# Patient Record
Sex: Female | Born: 1945
Health system: Southern US, Community
[De-identification: ages and names within clinical notes are randomized; demographics above are authoritative.]

## PROBLEM LIST (undated history)

## (undated) DIAGNOSIS — I10 Essential (primary) hypertension: Secondary | ICD-10-CM

## (undated) DIAGNOSIS — H409 Unspecified glaucoma: Secondary | ICD-10-CM

## (undated) DIAGNOSIS — R1310 Dysphagia, unspecified: Secondary | ICD-10-CM

## (undated) DIAGNOSIS — Z8739 Personal history of other diseases of the musculoskeletal system and connective tissue: Secondary | ICD-10-CM

## (undated) DIAGNOSIS — I341 Nonrheumatic mitral (valve) prolapse: Secondary | ICD-10-CM

## (undated) DIAGNOSIS — K219 Gastro-esophageal reflux disease without esophagitis: Secondary | ICD-10-CM

## (undated) DIAGNOSIS — J309 Allergic rhinitis, unspecified: Secondary | ICD-10-CM

## (undated) DIAGNOSIS — M1711 Unilateral primary osteoarthritis, right knee: Secondary | ICD-10-CM

## (undated) DIAGNOSIS — R609 Edema, unspecified: Secondary | ICD-10-CM

## (undated) DIAGNOSIS — K5790 Diverticulosis of intestine, part unspecified, without perforation or abscess without bleeding: Secondary | ICD-10-CM

## (undated) DIAGNOSIS — M5432 Sciatica, left side: Secondary | ICD-10-CM

## (undated) DIAGNOSIS — M1990 Unspecified osteoarthritis, unspecified site: Secondary | ICD-10-CM

## (undated) DIAGNOSIS — I951 Orthostatic hypotension: Secondary | ICD-10-CM

## (undated) DIAGNOSIS — K5730 Diverticulosis of large intestine without perforation or abscess without bleeding: Secondary | ICD-10-CM

## (undated) DIAGNOSIS — E663 Overweight: Secondary | ICD-10-CM

## (undated) DIAGNOSIS — M545 Low back pain: Secondary | ICD-10-CM

## (undated) DIAGNOSIS — M7551 Bursitis of right shoulder: Secondary | ICD-10-CM

## (undated) DIAGNOSIS — H9190 Unspecified hearing loss, unspecified ear: Secondary | ICD-10-CM

## (undated) DIAGNOSIS — M5430 Sciatica, unspecified side: Secondary | ICD-10-CM

## (undated) DIAGNOSIS — N3941 Urge incontinence: Secondary | ICD-10-CM

## (undated) DIAGNOSIS — M5412 Radiculopathy, cervical region: Secondary | ICD-10-CM

## (undated) HISTORY — DX: Low back pain: M54.5

## (undated) HISTORY — DX: Sciatica, unspecified side: M54.30

## (undated) HISTORY — DX: Overweight: E66.3

## (undated) HISTORY — DX: Gastro-esophageal reflux disease without esophagitis: K21.9

## (undated) HISTORY — DX: Unilateral primary osteoarthritis, right knee: M17.11

## (undated) HISTORY — DX: Personal history of other diseases of the musculoskeletal system and connective tissue: Z87.39

## (undated) HISTORY — DX: Unspecified hearing loss, unspecified ear: H91.90

## (undated) HISTORY — DX: Bursitis of right shoulder: M75.51

## (undated) HISTORY — DX: Edema, unspecified: R60.9

## (undated) HISTORY — DX: Sciatica, left side: M54.32

## (undated) HISTORY — DX: Unspecified osteoarthritis, unspecified site: M19.90

## (undated) HISTORY — DX: Urge incontinence: N39.41

## (undated) HISTORY — DX: Essential (primary) hypertension: I10

## (undated) HISTORY — DX: Nonrheumatic mitral (valve) prolapse: I34.1

## (undated) HISTORY — DX: Unspecified glaucoma: H40.9

## (undated) HISTORY — DX: Orthostatic hypotension: I95.1

## (undated) HISTORY — DX: Allergic rhinitis, unspecified: J30.9

## (undated) HISTORY — DX: Radiculopathy, cervical region: M54.12

## (undated) HISTORY — DX: Dysphagia, unspecified: R13.10

## (undated) HISTORY — DX: Diverticulosis of intestine, part unspecified, without perforation or abscess without bleeding: K57.90

## (undated) HISTORY — DX: Diverticulosis of large intestine without perforation or abscess without bleeding: K57.30

---

## 1971-04-03 HISTORY — PX: TUBAL LIGATION: SHX77

## 1997-09-14 ENCOUNTER — Encounter: Admission: RE | Admit: 1997-09-14 | Discharge: 1997-09-14 | Payer: Self-pay | Admitting: Sports Medicine

## 1997-09-21 ENCOUNTER — Encounter: Admission: RE | Admit: 1997-09-21 | Discharge: 1997-09-21 | Payer: Self-pay | Admitting: Family Medicine

## 1997-09-22 ENCOUNTER — Encounter: Admission: RE | Admit: 1997-09-22 | Discharge: 1997-09-22 | Payer: Self-pay | Admitting: Family Medicine

## 1998-04-12 ENCOUNTER — Encounter: Admission: RE | Admit: 1998-04-12 | Discharge: 1998-04-12 | Payer: Self-pay | Admitting: Family Medicine

## 1998-05-17 ENCOUNTER — Encounter: Admission: RE | Admit: 1998-05-17 | Discharge: 1998-05-17 | Payer: Self-pay | Admitting: Sports Medicine

## 1998-09-06 ENCOUNTER — Encounter: Admission: RE | Admit: 1998-09-06 | Discharge: 1998-09-06 | Payer: Self-pay | Admitting: Sports Medicine

## 1999-01-17 ENCOUNTER — Emergency Department (HOSPITAL_COMMUNITY): Admission: EM | Admit: 1999-01-17 | Discharge: 1999-01-17 | Payer: Self-pay | Admitting: Emergency Medicine

## 1999-01-24 ENCOUNTER — Encounter: Admission: RE | Admit: 1999-01-24 | Discharge: 1999-01-24 | Payer: Self-pay | Admitting: Family Medicine

## 1999-02-14 ENCOUNTER — Encounter: Admission: RE | Admit: 1999-02-14 | Discharge: 1999-02-14 | Payer: Self-pay | Admitting: Family Medicine

## 1999-02-17 ENCOUNTER — Encounter: Admission: RE | Admit: 1999-02-17 | Discharge: 1999-03-16 | Payer: Self-pay | Admitting: Family Medicine

## 1999-02-28 ENCOUNTER — Encounter: Admission: RE | Admit: 1999-02-28 | Discharge: 1999-02-28 | Payer: Self-pay | Admitting: Family Medicine

## 1999-03-30 ENCOUNTER — Other Ambulatory Visit: Admission: RE | Admit: 1999-03-30 | Discharge: 1999-03-30 | Payer: Self-pay | Admitting: Obstetrics and Gynecology

## 1999-06-20 ENCOUNTER — Encounter: Admission: RE | Admit: 1999-06-20 | Discharge: 1999-06-20 | Payer: Self-pay | Admitting: Family Medicine

## 1999-09-19 ENCOUNTER — Encounter: Admission: RE | Admit: 1999-09-19 | Discharge: 1999-09-19 | Payer: Self-pay | Admitting: Family Medicine

## 1999-09-19 DIAGNOSIS — M7551 Bursitis of right shoulder: Secondary | ICD-10-CM

## 1999-09-19 HISTORY — DX: Bursitis of right shoulder: M75.51

## 1999-09-28 ENCOUNTER — Encounter: Admission: RE | Admit: 1999-09-28 | Discharge: 1999-09-28 | Payer: Self-pay | Admitting: Family Medicine

## 2000-03-05 ENCOUNTER — Other Ambulatory Visit: Admission: RE | Admit: 2000-03-05 | Discharge: 2000-03-05 | Payer: Self-pay | Admitting: Obstetrics and Gynecology

## 2000-03-19 ENCOUNTER — Encounter: Admission: RE | Admit: 2000-03-19 | Discharge: 2000-03-19 | Payer: Self-pay | Admitting: Family Medicine

## 2000-09-24 ENCOUNTER — Encounter: Admission: RE | Admit: 2000-09-24 | Discharge: 2000-09-24 | Payer: Self-pay | Admitting: Family Medicine

## 2000-11-05 ENCOUNTER — Encounter: Admission: RE | Admit: 2000-11-05 | Discharge: 2000-11-05 | Payer: Self-pay | Admitting: Family Medicine

## 2000-11-06 ENCOUNTER — Encounter: Admission: RE | Admit: 2000-11-06 | Discharge: 2000-11-06 | Payer: Self-pay | Admitting: Family Medicine

## 2000-11-06 ENCOUNTER — Encounter: Payer: Self-pay | Admitting: Family Medicine

## 2001-01-21 ENCOUNTER — Encounter: Admission: RE | Admit: 2001-01-21 | Discharge: 2001-01-21 | Payer: Self-pay | Admitting: Family Medicine

## 2001-01-24 ENCOUNTER — Encounter: Admission: RE | Admit: 2001-01-24 | Discharge: 2001-01-24 | Payer: Self-pay | Admitting: Family Medicine

## 2001-01-24 ENCOUNTER — Encounter: Payer: Self-pay | Admitting: Family Medicine

## 2001-03-05 ENCOUNTER — Other Ambulatory Visit: Admission: RE | Admit: 2001-03-05 | Discharge: 2001-03-05 | Payer: Self-pay | Admitting: Obstetrics and Gynecology

## 2001-09-16 ENCOUNTER — Encounter: Admission: RE | Admit: 2001-09-16 | Discharge: 2001-09-16 | Payer: Self-pay | Admitting: Family Medicine

## 2002-03-06 ENCOUNTER — Other Ambulatory Visit: Admission: RE | Admit: 2002-03-06 | Discharge: 2002-03-06 | Payer: Self-pay | Admitting: Obstetrics and Gynecology

## 2002-09-08 ENCOUNTER — Encounter: Admission: RE | Admit: 2002-09-08 | Discharge: 2002-09-08 | Payer: Self-pay | Admitting: Family Medicine

## 2002-10-06 ENCOUNTER — Encounter: Admission: RE | Admit: 2002-10-06 | Discharge: 2002-10-06 | Payer: Self-pay | Admitting: Family Medicine

## 2002-10-06 ENCOUNTER — Ambulatory Visit (HOSPITAL_COMMUNITY): Admission: RE | Admit: 2002-10-06 | Discharge: 2002-10-06 | Payer: Self-pay | Admitting: Family Medicine

## 2002-10-20 ENCOUNTER — Encounter: Admission: RE | Admit: 2002-10-20 | Discharge: 2002-10-20 | Payer: Self-pay | Admitting: Family Medicine

## 2002-11-10 ENCOUNTER — Encounter: Admission: RE | Admit: 2002-11-10 | Discharge: 2002-11-10 | Payer: Self-pay | Admitting: Family Medicine

## 2003-01-19 ENCOUNTER — Encounter: Admission: RE | Admit: 2003-01-19 | Discharge: 2003-01-19 | Payer: Self-pay | Admitting: Family Medicine

## 2003-02-23 ENCOUNTER — Encounter: Admission: RE | Admit: 2003-02-23 | Discharge: 2003-02-23 | Payer: Self-pay | Admitting: Family Medicine

## 2003-03-11 ENCOUNTER — Encounter: Admission: RE | Admit: 2003-03-11 | Discharge: 2003-03-11 | Payer: Self-pay | Admitting: Family Medicine

## 2003-03-12 ENCOUNTER — Other Ambulatory Visit: Admission: RE | Admit: 2003-03-12 | Discharge: 2003-03-12 | Payer: Self-pay | Admitting: Obstetrics and Gynecology

## 2003-05-11 ENCOUNTER — Encounter: Admission: RE | Admit: 2003-05-11 | Discharge: 2003-05-11 | Payer: Self-pay | Admitting: Family Medicine

## 2003-06-08 ENCOUNTER — Encounter: Admission: RE | Admit: 2003-06-08 | Discharge: 2003-06-08 | Payer: Self-pay | Admitting: Family Medicine

## 2003-07-06 ENCOUNTER — Encounter: Admission: RE | Admit: 2003-07-06 | Discharge: 2003-07-06 | Payer: Self-pay | Admitting: Family Medicine

## 2003-10-12 ENCOUNTER — Encounter: Admission: RE | Admit: 2003-10-12 | Discharge: 2003-10-12 | Payer: Self-pay | Admitting: Family Medicine

## 2004-01-11 ENCOUNTER — Ambulatory Visit: Payer: Self-pay | Admitting: Family Medicine

## 2004-02-15 ENCOUNTER — Ambulatory Visit: Payer: Self-pay | Admitting: Family Medicine

## 2004-03-02 ENCOUNTER — Encounter (INDEPENDENT_AMBULATORY_CARE_PROVIDER_SITE_OTHER): Payer: Self-pay | Admitting: *Deleted

## 2004-03-02 LAB — CONVERTED CEMR LAB

## 2004-03-15 ENCOUNTER — Other Ambulatory Visit: Admission: RE | Admit: 2004-03-15 | Discharge: 2004-03-15 | Payer: Self-pay | Admitting: Obstetrics and Gynecology

## 2004-04-26 ENCOUNTER — Ambulatory Visit: Payer: Self-pay | Admitting: Family Medicine

## 2004-05-09 ENCOUNTER — Ambulatory Visit: Payer: Self-pay | Admitting: Sports Medicine

## 2004-06-27 ENCOUNTER — Ambulatory Visit: Payer: Self-pay | Admitting: Family Medicine

## 2004-08-29 ENCOUNTER — Ambulatory Visit: Payer: Self-pay | Admitting: Family Medicine

## 2004-09-12 ENCOUNTER — Ambulatory Visit (HOSPITAL_COMMUNITY): Admission: RE | Admit: 2004-09-12 | Discharge: 2004-09-12 | Payer: Self-pay | Admitting: Gastroenterology

## 2004-09-12 ENCOUNTER — Encounter (INDEPENDENT_AMBULATORY_CARE_PROVIDER_SITE_OTHER): Payer: Self-pay | Admitting: *Deleted

## 2004-11-28 ENCOUNTER — Ambulatory Visit: Payer: Self-pay | Admitting: Family Medicine

## 2005-04-17 ENCOUNTER — Other Ambulatory Visit: Admission: RE | Admit: 2005-04-17 | Discharge: 2005-04-17 | Payer: Self-pay | Admitting: Obstetrics and Gynecology

## 2005-05-29 ENCOUNTER — Ambulatory Visit: Payer: Self-pay | Admitting: Family Medicine

## 2005-08-14 ENCOUNTER — Ambulatory Visit: Payer: Self-pay | Admitting: Family Medicine

## 2005-12-18 ENCOUNTER — Ambulatory Visit: Payer: Self-pay | Admitting: Family Medicine

## 2006-03-04 ENCOUNTER — Ambulatory Visit: Payer: Self-pay | Admitting: Sports Medicine

## 2006-03-04 ENCOUNTER — Encounter: Admission: RE | Admit: 2006-03-04 | Discharge: 2006-03-04 | Payer: Self-pay | Admitting: Family Medicine

## 2006-03-11 DIAGNOSIS — M1711 Unilateral primary osteoarthritis, right knee: Secondary | ICD-10-CM

## 2006-03-11 HISTORY — DX: Unilateral primary osteoarthritis, right knee: M17.11

## 2006-03-19 ENCOUNTER — Ambulatory Visit: Payer: Self-pay | Admitting: Family Medicine

## 2006-04-23 ENCOUNTER — Ambulatory Visit: Payer: Self-pay | Admitting: Family Medicine

## 2006-04-24 ENCOUNTER — Other Ambulatory Visit: Admission: RE | Admit: 2006-04-24 | Discharge: 2006-04-24 | Payer: Self-pay | Admitting: Obstetrics and Gynecology

## 2006-05-07 ENCOUNTER — Ambulatory Visit: Payer: Self-pay | Admitting: Family Medicine

## 2006-05-30 DIAGNOSIS — E663 Overweight: Secondary | ICD-10-CM

## 2006-05-30 DIAGNOSIS — K5730 Diverticulosis of large intestine without perforation or abscess without bleeding: Secondary | ICD-10-CM

## 2006-05-30 DIAGNOSIS — M5430 Sciatica, unspecified side: Secondary | ICD-10-CM | POA: Insufficient documentation

## 2006-05-30 DIAGNOSIS — M1990 Unspecified osteoarthritis, unspecified site: Secondary | ICD-10-CM

## 2006-05-30 DIAGNOSIS — I10 Essential (primary) hypertension: Secondary | ICD-10-CM | POA: Insufficient documentation

## 2006-05-30 HISTORY — DX: Sciatica, unspecified side: M54.30

## 2006-05-30 HISTORY — DX: Overweight: E66.3

## 2006-05-30 HISTORY — DX: Essential (primary) hypertension: I10

## 2006-05-30 HISTORY — DX: Diverticulosis of large intestine without perforation or abscess without bleeding: K57.30

## 2006-05-30 HISTORY — DX: Unspecified osteoarthritis, unspecified site: M19.90

## 2006-05-31 ENCOUNTER — Encounter (INDEPENDENT_AMBULATORY_CARE_PROVIDER_SITE_OTHER): Payer: Self-pay | Admitting: *Deleted

## 2006-08-26 ENCOUNTER — Encounter: Payer: Self-pay | Admitting: Family Medicine

## 2006-08-27 ENCOUNTER — Ambulatory Visit: Payer: Self-pay | Admitting: Family Medicine

## 2006-08-27 DIAGNOSIS — R5381 Other malaise: Secondary | ICD-10-CM | POA: Insufficient documentation

## 2006-08-27 DIAGNOSIS — R5383 Other fatigue: Secondary | ICD-10-CM

## 2006-08-27 LAB — CONVERTED CEMR LAB
BUN: 16 mg/dL (ref 6–23)
CO2: 28 meq/L (ref 19–32)
Calcium: 10.3 mg/dL (ref 8.4–10.5)
Chloride: 107 meq/L (ref 96–112)
Cholesterol: 165 mg/dL (ref 0–200)
Creatinine, Ser: 0.83 mg/dL (ref 0.40–1.20)
Glucose, Bld: 89 mg/dL (ref 70–99)
HCT: 38.3 %
HDL: 44 mg/dL (ref >39)
Hemoglobin: 13 g/dL
LDL Cholesterol: 90 mg/dL (ref 0–99)
MCV: 90.4 fL
Platelets: 312 10*3/uL
Potassium: 4.2 meq/L (ref 3.5–5.3)
RBC: 4.24 M/uL
Sodium: 144 meq/L (ref 135–145)
Total CHOL/HDL Ratio: 3.8
Triglycerides: 153 mg/dL — ABNORMAL HIGH (ref <150)
VLDL: 31 mg/dL (ref 0–40)
WBC: 4.4 10*3/uL

## 2006-08-28 ENCOUNTER — Encounter: Payer: Self-pay | Admitting: Family Medicine

## 2006-11-26 ENCOUNTER — Ambulatory Visit: Payer: Self-pay | Admitting: Family Medicine

## 2007-04-30 ENCOUNTER — Other Ambulatory Visit: Admission: RE | Admit: 2007-04-30 | Discharge: 2007-04-30 | Payer: Self-pay | Admitting: Obstetrics and Gynecology

## 2007-05-20 ENCOUNTER — Ambulatory Visit: Payer: Self-pay | Admitting: Family Medicine

## 2007-05-20 LAB — CONVERTED CEMR LAB
ALT: 16 units/L (ref 0–35)
AST: 17 units/L (ref 0–37)
Albumin: 4.7 g/dL (ref 3.5–5.2)
Alkaline Phosphatase: 62 units/L (ref 39–117)
BUN: 13 mg/dL (ref 6–23)
CO2: 30 meq/L (ref 19–32)
Calcium: 10.6 mg/dL — ABNORMAL HIGH (ref 8.4–10.5)
Chloride: 104 meq/L (ref 96–112)
Creatinine, Ser: 0.69 mg/dL (ref 0.40–1.20)
Glucose, Bld: 98 mg/dL (ref 70–99)
HCT: 36.7 % (ref 36.0–46.0)
Hemoglobin: 12 g/dL (ref 12.0–15.0)
MCHC: 32.7 g/dL (ref 30.0–36.0)
MCV: 88.6 fL (ref 78.0–100.0)
Platelets: 328 10*3/uL (ref 150–400)
Potassium: 3.7 meq/L (ref 3.5–5.3)
RBC: 4.14 M/uL (ref 3.87–5.11)
RDW: 13.2 % (ref 11.5–15.5)
Sodium: 143 meq/L (ref 135–145)
Total Bilirubin: 0.7 mg/dL (ref 0.3–1.2)
Total Protein: 7.6 g/dL (ref 6.0–8.3)
WBC: 4.7 10*3/uL (ref 4.0–10.5)

## 2007-05-21 ENCOUNTER — Encounter: Payer: Self-pay | Admitting: Family Medicine

## 2007-05-22 ENCOUNTER — Telehealth: Payer: Self-pay | Admitting: *Deleted

## 2007-05-22 ENCOUNTER — Telehealth (INDEPENDENT_AMBULATORY_CARE_PROVIDER_SITE_OTHER): Payer: Self-pay | Admitting: Family Medicine

## 2007-06-02 ENCOUNTER — Encounter: Payer: Self-pay | Admitting: Family Medicine

## 2007-08-04 ENCOUNTER — Encounter: Payer: Self-pay | Admitting: Family Medicine

## 2007-08-04 ENCOUNTER — Ambulatory Visit: Payer: Self-pay | Admitting: Family Medicine

## 2007-08-04 LAB — CONVERTED CEMR LAB
Cholesterol: 164 mg/dL (ref 0–200)
HDL: 46 mg/dL (ref >39)
Total CHOL/HDL Ratio: 3.6
Triglycerides: 118 mg/dL (ref <150)
VLDL: 24 mg/dL (ref 0–40)

## 2007-08-06 ENCOUNTER — Encounter: Payer: Self-pay | Admitting: Family Medicine

## 2007-08-12 ENCOUNTER — Ambulatory Visit: Payer: Self-pay | Admitting: Family Medicine

## 2007-08-28 ENCOUNTER — Ambulatory Visit: Payer: Self-pay | Admitting: Family Medicine

## 2007-08-28 ENCOUNTER — Encounter: Payer: Self-pay | Admitting: Family Medicine

## 2007-08-28 LAB — CONVERTED CEMR LAB
BUN: 14 mg/dL (ref 6–23)
Calcium: 10.3 mg/dL (ref 8.4–10.5)
Creatinine, Ser: 0.69 mg/dL (ref 0.40–1.20)
PTH: 23.8 pg/mL (ref 14.0–72.0)
Phosphorus: 3.1 mg/dL (ref 2.3–4.6)
Potassium: 4.2 meq/L (ref 3.5–5.3)

## 2007-09-01 ENCOUNTER — Encounter: Payer: Self-pay | Admitting: Family Medicine

## 2007-11-11 ENCOUNTER — Ambulatory Visit: Payer: Self-pay | Admitting: Family Medicine

## 2007-11-11 LAB — CONVERTED CEMR LAB
HCT: 38.8 % (ref 36.0–46.0)
MCHC: 32.2 g/dL (ref 30.0–36.0)
MCV: 90.9 fL (ref 78.0–100.0)
Platelets: 300 10*3/uL (ref 150–400)
RDW: 13.7 % (ref 11.5–15.5)
TSH: 1.116 microintl units/mL (ref 0.350–4.50)

## 2007-11-12 ENCOUNTER — Encounter: Payer: Self-pay | Admitting: Family Medicine

## 2008-04-02 HISTORY — PX: VULVA /PERINEUM BIOPSY: SHX319

## 2008-04-29 ENCOUNTER — Encounter: Payer: Self-pay | Admitting: Family Medicine

## 2008-06-01 ENCOUNTER — Ambulatory Visit: Payer: Self-pay | Admitting: Family Medicine

## 2008-06-01 DIAGNOSIS — IMO0001 Reserved for inherently not codable concepts without codable children: Secondary | ICD-10-CM | POA: Insufficient documentation

## 2008-06-01 DIAGNOSIS — N3941 Urge incontinence: Secondary | ICD-10-CM | POA: Insufficient documentation

## 2008-06-01 HISTORY — DX: Urge incontinence: N39.41

## 2008-06-01 LAB — CONVERTED CEMR LAB
Alkaline Phosphatase: 53 units/L (ref 39–117)
CO2: 27 meq/L (ref 19–32)
Creatinine, Ser: 0.77 mg/dL (ref 0.40–1.20)
Glucose, Bld: 85 mg/dL (ref 70–99)
MCHC: 34.1 g/dL (ref 30.0–36.0)
MCV: 88.9 fL (ref 78.0–100.0)
Nitrite: NEGATIVE
RDW: 13.1 % (ref 11.5–15.5)
Sed Rate: 7 mm/hr (ref 0–22)
Sodium: 144 meq/L (ref 135–145)
Specific Gravity, Urine: 1.015
Total Bilirubin: 0.6 mg/dL (ref 0.3–1.2)
Total Protein: 7.5 g/dL (ref 6.0–8.3)
Urobilinogen, UA: 0.2
WBC Urine, dipstick: NEGATIVE

## 2008-06-08 ENCOUNTER — Telehealth: Payer: Self-pay | Admitting: Family Medicine

## 2008-06-18 ENCOUNTER — Encounter: Payer: Self-pay | Admitting: *Deleted

## 2008-06-21 ENCOUNTER — Other Ambulatory Visit: Admission: RE | Admit: 2008-06-21 | Discharge: 2008-06-21 | Payer: Self-pay | Admitting: Obstetrics and Gynecology

## 2008-07-12 ENCOUNTER — Encounter: Payer: Self-pay | Admitting: Family Medicine

## 2008-07-13 ENCOUNTER — Ambulatory Visit: Payer: Self-pay | Admitting: Family Medicine

## 2008-07-13 ENCOUNTER — Ambulatory Visit (HOSPITAL_COMMUNITY): Admission: RE | Admit: 2008-07-13 | Discharge: 2008-07-13 | Payer: Self-pay | Admitting: Family Medicine

## 2008-07-14 ENCOUNTER — Encounter: Payer: Self-pay | Admitting: Family Medicine

## 2008-08-10 ENCOUNTER — Ambulatory Visit: Payer: Self-pay | Admitting: Family Medicine

## 2008-09-14 ENCOUNTER — Ambulatory Visit: Payer: Self-pay | Admitting: Family Medicine

## 2008-11-08 ENCOUNTER — Encounter: Payer: Self-pay | Admitting: Family Medicine

## 2008-11-08 LAB — CONVERTED CEMR LAB
Hemoglobin: 36.3 g/dL
Sed Rate: 8 mm/hr
WBC: 4.4 10*3/uL

## 2008-12-28 ENCOUNTER — Ambulatory Visit: Payer: Self-pay | Admitting: Family Medicine

## 2008-12-28 LAB — CONVERTED CEMR LAB
BUN: 14 mg/dL (ref 6–23)
CO2: 28 meq/L (ref 19–32)
Direct LDL: 90 mg/dL
Glucose, Bld: 85 mg/dL (ref 70–99)
Potassium: 3.8 meq/L (ref 3.5–5.3)
Sodium: 142 meq/L (ref 135–145)

## 2008-12-29 ENCOUNTER — Encounter: Payer: Self-pay | Admitting: Family Medicine

## 2009-01-13 ENCOUNTER — Encounter: Payer: Self-pay | Admitting: Family Medicine

## 2009-01-13 LAB — CONVERTED CEMR LAB: OCCULT 3: NEGATIVE

## 2009-01-17 ENCOUNTER — Ambulatory Visit: Payer: Self-pay | Admitting: Family Medicine

## 2009-04-18 ENCOUNTER — Encounter: Payer: Self-pay | Admitting: Family Medicine

## 2009-05-02 ENCOUNTER — Encounter: Payer: Self-pay | Admitting: Family Medicine

## 2009-05-10 ENCOUNTER — Encounter: Payer: Self-pay | Admitting: *Deleted

## 2009-05-10 ENCOUNTER — Ambulatory Visit: Payer: Self-pay | Admitting: Family Medicine

## 2009-05-10 DIAGNOSIS — M5136 Other intervertebral disc degeneration, lumbar region: Secondary | ICD-10-CM

## 2009-05-10 DIAGNOSIS — M545 Low back pain, unspecified: Secondary | ICD-10-CM

## 2009-05-10 HISTORY — DX: Low back pain, unspecified: M54.50

## 2009-05-10 LAB — CONVERTED CEMR LAB
Nitrite: NEGATIVE
Specific Gravity, Urine: 1.01
Urobilinogen, UA: 1
WBC Urine, dipstick: NEGATIVE
pH: 7

## 2009-05-17 ENCOUNTER — Encounter: Admission: RE | Admit: 2009-05-17 | Discharge: 2009-05-17 | Payer: Self-pay | Admitting: Family Medicine

## 2009-06-07 ENCOUNTER — Ambulatory Visit: Payer: Self-pay | Admitting: Family Medicine

## 2009-06-14 ENCOUNTER — Ambulatory Visit: Payer: Self-pay | Admitting: Family Medicine

## 2009-06-14 ENCOUNTER — Encounter: Payer: Self-pay | Admitting: Family Medicine

## 2009-06-14 LAB — CONVERTED CEMR LAB
CO2: 26 meq/L (ref 19–32)
Calcium: 10 mg/dL (ref 8.4–10.5)
Cholesterol: 149 mg/dL (ref 0–200)
Creatinine, Ser: 0.92 mg/dL (ref 0.40–1.20)
HDL: 46 mg/dL (ref >39)
Sodium: 142 meq/L (ref 135–145)
Total CHOL/HDL Ratio: 3.2
Triglycerides: 101 mg/dL (ref <150)

## 2009-06-15 ENCOUNTER — Encounter: Payer: Self-pay | Admitting: Family Medicine

## 2009-07-05 ENCOUNTER — Ambulatory Visit: Payer: Self-pay | Admitting: Family Medicine

## 2009-10-31 ENCOUNTER — Ambulatory Visit: Payer: Self-pay | Admitting: Family Medicine

## 2009-11-04 ENCOUNTER — Telehealth: Payer: Self-pay | Admitting: Family Medicine

## 2009-11-04 ENCOUNTER — Ambulatory Visit: Payer: Self-pay | Admitting: Family Medicine

## 2009-11-04 DIAGNOSIS — H409 Unspecified glaucoma: Secondary | ICD-10-CM | POA: Insufficient documentation

## 2010-01-10 ENCOUNTER — Ambulatory Visit: Payer: Self-pay | Admitting: Family Medicine

## 2010-01-10 LAB — CONVERTED CEMR LAB
BUN: 17 mg/dL (ref 6–23)
CO2: 27 meq/L (ref 19–32)
Calcium: 10.7 mg/dL — ABNORMAL HIGH (ref 8.4–10.5)
Glucose, Bld: 91 mg/dL (ref 70–99)
Sodium: 141 meq/L (ref 135–145)

## 2010-01-12 ENCOUNTER — Encounter: Payer: Self-pay | Admitting: Family Medicine

## 2010-02-18 ENCOUNTER — Encounter: Payer: Self-pay | Admitting: Family Medicine

## 2010-03-16 ENCOUNTER — Ambulatory Visit: Payer: Self-pay | Admitting: Family Medicine

## 2010-05-02 NOTE — Miscellaneous (Signed)
Summary: zoster vaccine/ts  Clinical Lists Changes zostavax called in to [facility] [phone]. pt aware of this .Marland KitchenArlyss Repress CMA,  May 10, 2009 5:21 PM

## 2010-05-02 NOTE — Assessment & Plan Note (Signed)
Summary: Dr. Sheffield Slider to see,eyes red and drainage/ls   Vital Signs:  Patient profile:   65 year old female Menstrual status:  postmenopausal Height:      64.25 inches Weight:      164 pounds BMI:     28.03 Temp:     97.9 degrees F oral Pulse rate:   71 / minute BP sitting:   109 / 65  (left arm) Cuff size:   regular  Vitals Entered By: Tessie Fass CMA (November 04, 2009 11:00 AM)   Primary Care Provider:  Zachery Dauer MD   History of Present Illness: Her sore throat and hoarseness have improved, but she now has severe irritation and redness of both eyes and full feeling right ear. No photophobia or pain in ears or eyes.   Husband has no viral symptoms .  Allergies: 1)  Septra (Sulfamethoxazole-Trimethoprim) 2)  Lisinopril (Lisinopril)  Physical Exam  General:  alert and well-nourished.  alert, well-nourished, and overweight-appearing.   Eyes:  severe conjunctival injection bilaterally  and excessive tearing.  Some purulence. Fluorescein staining normal. Not photophobic.Cataracts, anterior chambers not cloudy.  Ears:  left TM has probable fluid meniscii. right TM red over malleolus and dulled reflex   Impression & Recommendations:  Problem # 1:  CONJUNCTIVITIS, ACUTE, BILATERAL (ICD-372.00)  Probably viral, but developing purulence so will cover with antibiotic Her updated medication list for this problem includes:    Tobrasol 0.3 % Soln (Tobramycin sulfate) .[name]... Apply 2 drops every 4 hr x 5 days  Orders: [facility]- Est Level  3 (13244)  Complete Medication List: 1)  Bayer Childrens Aspirin 81 Mg Chew (Aspirin) .... Take 1 tablet by mouth once a day 2)  Fish Oil 1000 Mg Caps (Omega-3 fatty acids) .... Take 2 capsule by mouth once a day 3)  Hyzaar 100-25 Mg Tabs (Losartan potassium-hctz) .... Take 1 tab daily 4)  Metoprolol Succinate 200 Mg Xr24h-tab (Metoprolol succinate) .... Take 1  in a.m. 5)  Multivitamins Tabs (Multiple vitamin) .... Take one tablet daily 6)  Osteo  Bi-flex Adv Double St Tabs (Misc natural products) .... Take one two times a day 7)  Combigan 0.2-0.5 % Soln (Brimonidine tartrate-timolol) .... Apply two drops ou two times a day 8)  Travatan Z 0.004 % Soln (Travoprost) .... One drop ou every at bedtime 9)  Amlodipine Besylate 10 Mg Tabs (Amlodipine besylate) .... Take one tablet daily 10)  Tessalon Perles 100 Mg Caps (Benzonatate) .... Take one po twice daily. 11)  Tobrasol 0.3 % Soln (Tobramycin sulfate) .... Apply 2 drops every 4 hr x 5 days  Patient Instructions: 1)  Use alcohol hand cleaner and own towel and washcloth to try to avoid spreading the virus.  2)  Use antibiotic eye drops four times daily to prevent secondary infection. See your eye doctor if not much improved in 3 days. Prescriptions: TOBRASOL 0.3 % SOLN (TOBRAMYCIN SULFATE) Apply 2 drops every 4 hr x 5 days  #5 ml x 1   Entered and Authorized by:   Zachery Dauer MD   Signed by:   Zachery Dauer MD on 11/04/2009   Method used:   Electronically to        [facility] (retail)       [address]       [place], [place]  [id number]       Ph: [phone]       Fax: [phone]   RxID:   [id number]

## 2010-05-02 NOTE — Assessment & Plan Note (Signed)
Summary: f/u,df   Vital Signs:  Patient profile:   65 year old female Menstrual status:  postmenopausal Height:      64.25 inches Weight:      172 pounds Pulse rate:   63 / minute BP sitting:   113 / 70  (left arm) Cuff size:   regular  Vitals Entered By: Renato Battles slade,cma CC: f/up HTN and labs. Is Patient Diabetic? No Pain Assessment Patient in pain? no        CC:  f/up HTN and labs..  Habits & Providers  Alcohol-Tobacco-Diet     Tobacco Status: quit > 6 months  Allergies: 1)  Septra (Sulfamethoxazole-Trimethoprim) 2)  Lisinopril (Lisinopril)  Family History: [place] Breast sister died  and aunt died CVA - F died 1998-06-22 age 77, htn Htn - M died Cerebral hemorrhage age 35 Brother and 2 sisters her Brother in [place] Brother in [place]  Social History: Husband - Fayrene Fearing; Son - [employer] career development. Plans to retire Aug 2010 Smoked 1/2 PPD x 15 yr quit 1989 Alcohol use-seldom Grandparent recently Retired  Smoking Status:  quit > 6 months   Complete Medication List: 1)  Bayer Childrens Aspirin 81 Mg Chew (Aspirin) .... Take 1 tablet by mouth once a day 2)  Fish Oil 1000 Mg Caps (Omega-3 fatty acids) .... Take 2 capsule by mouth once a day 3)  Hyzaar 100-25 Mg Tabs (Losartan potassium-hctz) .... Take 1 tab daily 4)  Metoprolol Succinate 200 Mg Xr24h-tab (Metoprolol succinate) .... Take 1  in a.m. 5)  Multivitamins Tabs (Multiple vitamin) .... Take one tablet daily 6)  Osteo Bi-flex Adv Double St Tabs (Misc natural products) .... Take one two times a day 7)  Combigan 0.2-0.5 % Soln (Brimonidine tartrate-timolol) .... Apply two drops ou two times a day 8)  Travatan Z 0.004 % Soln (Travoprost) .... One drop ou every at bedtime 9)  Amlodipine Besylate 10 Mg Tabs (Amlodipine besylate) .... Take one tablet daily  Patient Instructions: 1)  Continue losing weight and exercising 2)  Please schedule a follow-up appointment in 6 months .  3)  I recommend the  Zoster shot.  Prescriptions: AMLODIPINE BESYLATE 10 MG TABS (AMLODIPINE BESYLATE) Take one tablet daily  #30 x 11   Entered and Authorized by:   Zachery Dauer MD   Signed by:   Zachery Dauer MD on 07/05/2009   Method used:   Electronically to        [facility] [address] (retail)       [address]       [place], [place]  [id number]       Ph: [phone]       Fax: [phone]   RxID:   [id number]    Prevention & Chronic Care Immunizations   Influenza vaccine: Fluvax MCR  (01/17/2009)    Tetanus booster: 05/31/2004: Done.   Tetanus booster due: 06/01/2014    Pneumococcal vaccine: Pneumovax (Medicare)  (07/13/2008)    H. zoster vaccine: Not documented  Colorectal Screening   Hemoccult: Neg x 3  (01/13/2009)   Hemoccult due: Not Indicated    Colonoscopy: Done.  (08/31/2004)   Colonoscopy due: 08/31/2009  Other Screening   Pap smear: normal  (04/07/2007)   Pap smear due: Not Indicated    Mammogram: Assessment: BIRADS 1. Location: [facility].    (05/02/2009)   Mammogram action/deferral: Screening mammogram in 1 year.     (05/02/2009)   Mammogram due: 04/24/2009  DXA bone density scan:  Lumbar Spine:  T Score > -1.0 Spine.  Hip Total: T Score > -1.0 Hip.  [facility].      (05/18/2009)   DXA scan due: 05/18/2014    Smoking status: quit > 6 months  (07/05/2009)  Lipids   Total Cholesterol: 149  (06/14/2009)   LDL: 83  (06/14/2009)   LDL Direct: 90  (12/28/2008)   HDL: 46  (06/14/2009)   Triglycerides: 101  (06/14/2009)  Hypertension   Last Blood Pressure: 113 / 70  (07/05/2009)   Serum creatinine: 0.92  (06/14/2009)   Serum potassium 3.7  (06/14/2009)    Hypertension flowsheet reviewed?: Yes   Progress toward BP goal: At goal  Self-Management Support :   Personal Goals (by the next clinic visit) :      Personal blood pressure goal: 140/90  (12/28/2008)   Hypertension  self-management support: BP self-monitoring log  (12/28/2008)  Appended Document: f/u htn,df     Primary Care Provider:  Zachery Dauer MD   History of Present Illness: Now that her husband has improved she is starting to walk for exercise again. Having success losing weight through portion control.   Allergies: 1)  Septra (Sulfamethoxazole-Trimethoprim) 2)  Lisinopril (Lisinopril)  Physical Exam  Heart:  Normal rate and regular rhythm. S1 and S2 normal    Extremities:  no edema Psych:  good eye contact and less dysphoric affect.     Impression & Recommendations:  Problem # 1:  HYPERTENSION, BENIGN SYSTEMIC (ICD-401.1)  Her updated medication list for this problem includes:    Hyzaar 100-25 Mg Tabs (Losartan potassium-hctz) .[name]... Take 1 tab daily    Metoprolol Succinate 200 Mg Xr24h-tab (Metoprolol succinate) .[name]... Take 1  in a.m.    Amlodipine Besylate 10 Mg Tabs (Amlodipine besylate) .[name]... Take one tablet daily  Orders: [facility]- Est Level  3 (09811)  Problem # 2:  OBESITY, NOS (ICD-278.00) Assessment: Improved  Orders: [facility]- Est Level  3 (99213)  Problem # 3:  LOW BACK PAIN SYNDROME (ICD-724.2) improved Her updated medication list for this problem includes:    Bayer Childrens Aspirin 81 Mg Chew (Aspirin) .[name]... Take 1 tablet by mouth once a day  Orders: [facility]- Est Level  3 (91478)  Complete Medication List: 1)  Bayer Childrens Aspirin 81 Mg Chew (Aspirin) .... Take 1 tablet by mouth once a day 2)  Fish Oil 1000 Mg Caps (Omega-3 fatty acids) .... Take 2 capsule by mouth once a day 3)  Hyzaar 100-25 Mg Tabs (Losartan potassium-hctz) .... Take 1 tab daily 4)  Metoprolol Succinate 200 Mg Xr24h-tab (Metoprolol succinate) .... Take 1  in a.m. 5)  Multivitamins Tabs (Multiple vitamin) .... Take one tablet daily 6)  Osteo Bi-flex Adv Double St Tabs (Misc natural products) .... Take one two times a day 7)  Combigan 0.2-0.5 % Soln (Brimonidine tartrate-timolol) .... Apply two  drops ou two times a day 8)  Travatan Z 0.004 % Soln (Travoprost) .... One drop ou every at bedtime 9)  Amlodipine Besylate 10 Mg Tabs (Amlodipine besylate) .... Take one tablet daily  Patient Instructions: 1)  Goals is to get back to walking 4 times weekly for 40 minutes at a time. Goal 10 lb weight loss.   Appended Document: blood pressures from home BP's she brought with her ranged from 112-144/65-79  Pulse 51-60   Clinical Lists Changes

## 2010-05-02 NOTE — Miscellaneous (Signed)
Summary: Metoprolol ER dosing change  Clinical Lists Changes   Fax from pharmacy. [employer] plan doesn't want to cover two times a day dosing. Faxed back to change instructions to 1 and 1/2 tab every AM. I called the patient and she will try this dosing. We will review its effect on office visit early Feb.   Appended Document: Metoprolol ER dosing change    Clinical Lists Changes  Medications: Changed medication from METOPROLOL SUCCINATE 100 MG XR24H-TAB (METOPROLOL SUCCINATE) Take 1/2  in A.M. and 1 in P.M. to METOPROLOL SUCCINATE 100 MG XR24H-TAB (METOPROLOL SUCCINATE) Take 1 and 1/2  in A.M. - Signed Rx of METOPROLOL SUCCINATE 100 MG XR24H-TAB (METOPROLOL SUCCINATE) Take 1 and 1/2  in A.M.;  #45 x 11;  Signed;  Entered by: Zachery Dauer MD;  Authorized by: Zachery Dauer MD;  Method used: Electronically to [facility] [address] [address], [place], [place]  [id number], Ph: [phone], Fax: [phone]    Prescriptions: METOPROLOL SUCCINATE 100 MG XR24H-TAB (METOPROLOL SUCCINATE) Take 1 and 1/2  in A.M.  #45 x 11   Entered and Authorized by:   Zachery Dauer MD   Signed by:   Zachery Dauer MD on 04/18/2009   Method used:   Electronically to        [facility] (retail)       [address]       [place], [place]  [id number]       Ph: [phone]       Fax: [phone]   RxID:   [id number]

## 2010-05-02 NOTE — Assessment & Plan Note (Signed)
Summary: f/up HTN,tcb   Vital Signs:  Patient profile:   65 year old female Menstrual status:  postmenopausal Height:      64.25 inches Weight:      178 pounds BMI:     30.43 Temp:     97.5 degrees F oral Pulse rate:   57 / minute BP sitting:   168 / 84  (right arm) Cuff size:   regular  Vitals Entered By: Tessie Fass CMA (May 10, 2009 2:43 PM) CC: F/U BP Is Patient Diabetic? No Pain Assessment Patient in pain? yes     Location: right side Intensity: 4   Primary Care Provider:  Zachery Dauer MD  CC:  F/U BP.  History of Present Illness: Despite being retired hasn't found much time to exercise. Didn't get to the [facility] in January.   right sided pain, fleetingly 9/10 then decreased to 4/10, now gone. No GI or GU symptoms. No trauma, but has been picking up her 26 year old grandson who feels heavy.   blood pressures from home 115-157/ 68-79.   Habits & Providers  Alcohol-Tobacco-Diet     Tobacco Status: quit  Current Medications (verified): 1)  Bayer Childrens Aspirin 81 Mg Chew (Aspirin) .... Take 1 Tablet By Mouth Once A Day 2)  Fish Oil 1000 Mg Caps (Omega-3 Fatty Acids) .... Take 2 Capsule By Mouth Once A Day 3)  Hyzaar 100-12.5 Mg Tabs (Losartan Potassium-Hctz) .... Take One Tablet Daily 4)  Metoprolol Succinate 100 Mg Xr24h-Tab (Metoprolol Succinate) .... Take 1 and 1/2  in A.m. 5)  Multivitamins  Tabs (Multiple Vitamin) .... Take One Tablet Daily 6)  Osteo Bi-Flex Adv Double St  Tabs (Misc Natural Products) .... Take One Two Times A Day 7)  Combigan 0.2-0.5 % Soln (Brimonidine Tartrate-Timolol) .... Apply Two Drops Ou Two Times A Day 8)  Travatan Z 0.004 % Soln (Travoprost) .... One Drop Ou Every At Bedtime 9)  Amlodipine Besylate 10 Mg Tabs (Amlodipine Besylate) .... Take One Tablet Daily  Allergies (verified): 1)  Septra (Sulfamethoxazole-Trimethoprim) 2)  Lisinopril (Lisinopril)  Physical Exam  General:  alert and overweight-appearing.  Mildly  fatigued appearing.  Lungs:  Normal respiratory effort, chest expands symmetrically. Lungs are clear to auscultation, no crackles or wheezes. Heart:  Normal rate and regular rhythm. S1 and S2 normal    Abdomen:  Bowel sounds positive,abdomen soft and non-tender without masses, organomegaly or hernias noted. Tender right upper pelvic brim, but not reproduce by trunk twisting our stretching Msk:   Back, normal ROM.     Impression & Recommendations:  Problem # 1:  LOW BACK PAIN SYNDROME (ICD-724.2) Actually right side, likely muscular. rule out urinary tract infection. Due for DEXA Her updated medication list for this problem includes:    Bayer Childrens Aspirin 81 Mg Chew (Aspirin) .[name]... Take 1 tablet by mouth once a day  Orders: Urinalysis-[facility] (00000) Dexa scan (Dexa scan) [facility]- Est  Level 4 ([address])  Problem # 2:  HYPERTENSION, BENIGN SYSTEMIC (ICD-401.1) Assessment: Unchanged Increase HCTZ to 25 mg.  Her updated medication list for this problem includes:    Hyzaar 100-25 Mg Tabs (Losartan potassium-hctz) .[name]... Take 1 tab daily    Metoprolol Succinate 100 Mg Xr24h-tab (Metoprolol succinate) .[name]... Take 1 and 1/2  in a.m.    Amlodipine Besylate 10 Mg Tabs (Amlodipine besylate) .[name]... Take one tablet daily  Problem # 3:  URGE INCONTINENCE (ICD-788.31) Assessment: Improved  Orders: [facility]- Est  Level 4 ([address]454)  Problem #  4:  SCIATICA (ICD-724.3) Assessment: Improved  Her updated medication list for this problem includes:    Bayer Childrens Aspirin 81 Mg Chew (Aspirin) .[name]... Take 1 tablet by mouth once a day  Complete Medication List: 1)  Bayer Childrens Aspirin 81 Mg Chew (Aspirin) .... Take 1 tablet by mouth once a day 2)  Fish Oil 1000 Mg Caps (Omega-3 fatty acids) .... Take 2 capsule by mouth once a day 3)  Hyzaar 100-25 Mg Tabs (Losartan potassium-hctz) .... Take 1 tab daily 4)  Metoprolol Succinate 100 Mg Xr24h-tab (Metoprolol succinate) .... Take 1 and 1/2  in a.m. 5)   Multivitamins Tabs (Multiple vitamin) .... Take one tablet daily 6)  Osteo Bi-flex Adv Double St Tabs (Misc natural products) .... Take one two times a day 7)  Combigan 0.2-0.5 % Soln (Brimonidine tartrate-timolol) .... Apply two drops ou two times a day 8)  Travatan Z 0.004 % Soln (Travoprost) .... One drop ou every at bedtime 9)  Amlodipine Besylate 10 Mg Tabs (Amlodipine besylate) .... Take one tablet daily  Patient Instructions: 1)  Please schedule a follow-up appointment in 1 month.  2)  The HCTZ in your Hyzaar was increased  3)  Will go to [facility] or walk 4  times weekly 20.minutes.  Prescriptions: HYZAAR 100-25 MG TABS (LOSARTAN POTASSIUM-HCTZ) Take 1 tab daily  #30 x 11   Entered and Authorized by:   Zachery Dauer MD   Signed by:   Zachery Dauer MD on 05/10/2009   Method used:   Electronically to        [facility] (retail)       [address]       [place], [place]  [id number]       Ph: [phone]       Fax: [phone]   RxID:   [id number]       Prevention & Chronic Care Immunizations   Influenza vaccine: Fluvax MCR  (01/17/2009)    Tetanus booster: 05/31/2004: Done.   Tetanus booster due: 06/01/2014    Pneumococcal vaccine: Pneumovax (Medicare)  (07/13/2008)    H. zoster vaccine: Not documented  Colorectal Screening   Hemoccult: Neg x 3  (01/13/2009)   Hemoccult due: Not Indicated    Colonoscopy: Done.  (08/31/2004)   Colonoscopy due: 08/31/2009  Other Screening   Pap smear: normal  (04/07/2007)   Pap smear due: Not Indicated    Mammogram: Assessment: BIRADS 1. Location: [facility].    (05/02/2009)   Mammogram action/deferral: Screening mammogram in 1 year.     (05/02/2009)   Mammogram due: 04/24/2009    DXA bone density scan: Done.  (04/02/2004)   DXA scan due: None    Smoking status: quit  (05/10/2009)  Lipids   Total Cholesterol: 164  (08/04/2007)   LDL: 94  (08/04/2007)   LDL Direct: 90   (12/28/2008)   HDL: 46  (08/04/2007)   Triglycerides: 118  (08/04/2007)  Hypertension   Last Blood Pressure: 168 / 84  (05/10/2009)   Serum creatinine: 0.87  (12/28/2008)   Serum potassium 3.8  (12/28/2008)    Hypertension flowsheet reviewed?: Yes   Progress toward BP goal: Deteriorated  Self-Management Support :   Personal Goals (by the next clinic visit) :      Personal blood pressure goal: 140/90  (12/28/2008)   Hypertension self-management support: BP self-monitoring log  (12/28/2008)   Laboratory Results   Urine Tests  Date/Time Received: May 10, 2009  3:51 PM  Date/Time Reported: May 10, 2009 4:27 PM   Routine Urinalysis   Color: yellow Appearance: Clear Glucose: negative   (Normal Range: Negative) Bilirubin: negative   (Normal Range: Negative) Ketone: negative   (Normal Range: Negative) Spec. Gravity: 1.010   (Normal Range: 1.003-1.035) Blood: negative   (Normal Range: Negative) pH: 7.0   (Normal Range: 5.0-8.0) Protein: negative   (Normal Range: Negative) Urobilinogen: 1.0   (Normal Range: 0-1) Nitrite: negative   (Normal Range: Negative) Leukocyte Esterace: negative   (Normal Range: Negative)    Comments: ...........test performed by..........[name] [place], SMA

## 2010-05-02 NOTE — Assessment & Plan Note (Signed)
Summary: F/U/KH   Vital Signs:  Patient profile:   65 year old female Menstrual status:  postmenopausal Height:      64.25 inches Weight:      165.9 pounds BMI:     28.36 Pulse rate:   60 / minute BP sitting:   101 / 66  (right arm)  Vitals Entered By: Renato Battles slade,cma CC: more tired than usual...? depressed. flu shot Is Patient Diabetic? No Pain Assessment Patient in pain? no        Primary Care Jaiona Simien:  Zachery Dauer MD  CC:  more tired than usual...? depressed. flu shot.  History of Present Illness: Tired, sleeping poorly. Initial insomnia about once weekly, without apparent cause. Worries about her husband who is undergoing MM treatment. Next week Dr Shirline Frees will inform them about the [facility] results and whether he is ready for an autologous transplant.   Husband, Fayrene Fearing, is basically an optomist. Does become childish and demanding when his right leg pain is not well controlled.His short term memory is bad some days.  When he is feeling good he visits he 56 year old mother and Veverly gets out with her friends who allow her to ventilate. Her 2 sisters and 1 brother in [place] are also supportive.   She and Fayrene Fearing plan a trip across the country when he is better.   She is currently getting little exercise  Habits & Providers  Alcohol-Tobacco-Diet     Tobacco Status: quit > 6 months     Tobacco Counseling: to quit use of tobacco products     Year Quit: 1996  Current Medications (verified): 1)  Bayer Childrens Aspirin 81 Mg Chew (Aspirin) .... Take 1 Tablet By Mouth Once A Day 2)  Fish Oil 1000 Mg Caps (Omega-3 Fatty Acids) .... Take 2 Capsule By Mouth Once A Day 3)  Hyzaar 100-25 Mg Tabs (Losartan Potassium-Hctz) .... Take 1 Tab Daily 4)  Metoprolol Succinate 200 Mg Xr24h-Tab (Metoprolol Succinate) .... Take 1  in A.m. 5)  Multivitamins  Tabs (Multiple Vitamin) .... Take One Tablet Daily 6)  Osteo Bi-Flex Adv Double St  Tabs (Misc Natural Products) .... Take One Two Times A  Day 7)  Combigan 0.2-0.5 % Soln (Brimonidine Tartrate-Timolol) .... Apply Two Drops Ou Two Times A Day 8)  Travatan Z 0.004 % Soln (Travoprost) .... One Drop Ou Every At Bedtime 9)  Amlodipine Besylate 10 Mg Tabs (Amlodipine Besylate) .... Take One Tablet Daily 10)  Tobrasol 0.3 % Soln (Tobramycin Sulfate) .... Apply 2 Drops Every 4 Hr X 5 Days 11)  Vitamin D3 1000 Unit Caps (Cholecalciferol) .... Take One Tablet Daily  Allergies (verified): 1)  Septra 2)  Lisinopril (Lisinopril)  Family History: CA Breast sister died  and aunt died CVA - F died 1998/07/08 age 34, htn Htn - M died Cerebral hemorrhage age 64 Brother and 2 sisters here Brother in [place] Brother in [place]  Social History: Husband - Fayrene Fearing; Son - [employer] career development. Plans to retire Aug 2010 Smoked 1/2 PPD x 15 yr quit 1989 Alcohol use-seldom Grandparent recently Retired Husband being treated for multiple myeloma  Smoking Status:  quit > 6 months  Physical Exam  General:  Stooped shouldered Lungs:  CTAB. No wheezes, rales, or crackles.  Heart:  normal rate, regular rhythm, and no murmur.   Extremities:  no edema   Impression & Recommendations:  Problem # 1:  HYPERTENSION, BENIGN SYSTEMIC (ICD-401.1) Assessment Improved  Her updated medication list for this  problem includes:    Hyzaar 100-25 Mg Tabs (Losartan potassium-hctz) .[name]... Take 1 tab daily    Metoprolol Succinate 200 Mg Xr24h-tab (Metoprolol succinate) .[name]... Take 1  in a.m.    Amlodipine Besylate 10 Mg Tabs (Amlodipine besylate) .[name]... Take one tablet daily  Orders: [facility]- Est Level  3 ([address]) Basic Met-[facility] ([id number])  Problem # 2:  OBESITY, NOS (ICD-278.00) Assessment: Unchanged  Orders: [facility]- Est Level  3 ([address])  Problem # 3:  OSTEOARTHRITIS, MULTI SITES (ICD-715.98) Acetaminophen as needed Her updated medication list for this problem includes:    Bayer Childrens Aspirin 81 Mg Chew (Aspirin) .[name]... Take 1 tablet by mouth once  a day  Problem # 4:  FAMILY STRESS (ICD-V61.9) Continue ventilating to friends and family. Exercise 3 times weekly for 15 minutes  Complete Medication List: 1)  Bayer Childrens Aspirin 81 Mg Chew (Aspirin) .... Take 1 tablet by mouth once a day 2)  Fish Oil 1000 Mg Caps (Omega-3 fatty acids) .... Take 2 capsule by mouth once a day 3)  Hyzaar 100-25 Mg Tabs (Losartan potassium-hctz) .... Take 1 tab daily 4)  Metoprolol Succinate 200 Mg Xr24h-tab (Metoprolol succinate) .... Take 1  in a.m. 5)  Multivitamins Tabs (Multiple vitamin) .... Take one tablet daily 6)  Osteo Bi-flex Adv Double St Tabs (Misc natural products) .... Take one two times a day 7)  Combigan 0.2-0.5 % Soln (Brimonidine tartrate-timolol) .... Apply two drops ou two times a day 8)  Travatan Z 0.004 % Soln (Travoprost) .... One drop ou every at bedtime 9)  Amlodipine Besylate 10 Mg Tabs (Amlodipine besylate) .... Take one tablet daily 10)  Tobrasol 0.3 % Soln (Tobramycin sulfate) .... Apply 2 drops every 4 hr x 5 days 11)  Vitamin D3 1000 Unit Caps (Cholecalciferol) .... Take one tablet daily  Other Orders: Influenza Vaccine MCR (29562)  Patient Instructions: 1)  Call if you are developing low blood pressure symptoms or those of depression.  2)  Exercise for 15 minutes 3 times weekly. 3)  Please schedule a follow-up appointment in 3 months .    Prevention & Chronic Care Immunizations   Influenza vaccine: Fluvax MCR  (01/10/2010)    Tetanus booster: 05/31/2004: Done.   Tetanus booster due: 06/01/2014    Pneumococcal vaccine: Pneumovax (Medicare)  (07/13/2008)    H. zoster vaccine: Not documented  Colorectal Screening   Hemoccult: Neg x 3  (01/13/2009)   Hemoccult due: Not Indicated    Colonoscopy: Done.  (08/31/2004)   Colonoscopy due: 08/31/2009  Other Screening   Pap smear: normal  (04/07/2007)   Pap smear due: Not Indicated    Mammogram: Assessment: BIRADS 1. Location: [facility].    (05/02/2009)   Mammogram action/deferral: Screening mammogram in 1 year.     (05/02/2009)   Mammogram due: 04/24/2009    DXA bone density scan:  Lumbar Spine:  T Score > -1.0 Spine.  Hip Total: T Score > -1.0 Hip.  [facility].      (05/18/2009)   DXA scan due: 05/18/2014    Smoking status: quit > 6 months  (01/10/2010)  Lipids   Total Cholesterol: 149  (06/14/2009)   LDL: 83  (06/14/2009)   LDL Direct: 90  (12/28/2008)   HDL: 46  (06/14/2009)   Triglycerides: 101  (06/14/2009)  Hypertension   Last Blood Pressure: 101 / 66  (01/10/2010)   Serum creatinine: 0.92  (06/14/2009)   Serum potassium 3.7  (  06/14/2009)    Hypertension flowsheet reviewed?: Yes   Progress toward BP goal: At goal   Hypertension comments: Denies orthostatic symptoms   Self-Management Support :   Personal Goals (by the next clinic visit) :      Personal blood pressure goal: 140/90  (12/28/2008)   Hypertension self-management support: BP self-monitoring log  (12/28/2008)   Influenza Vaccine    Vaccine Type: Fluvax MCR    Site: left deltoid    Mfr: GlaxoSmithKline    Dose: 0.5 ml    Route: IM    Given by: Arlyss Repress CMA,    Exp. Date: 09/27/2010    Lot #: [id number]    VIS given: 10/25/09 version given January 10, 2010.  Flu Vaccine Consent Questions    Do you have a history of severe allergic reactions to this vaccine? no    Any prior history of allergic reactions to egg and/or gelatin? no    Do you have a sensitivity to the preservative Thimersol? no    Do you have a past history of Guillan-Barre Syndrome? no    Do you currently have an acute febrile illness? no    Have you ever had a severe reaction to latex? no    Vaccine information given and explained to patient? yes    Are you currently pregnant? no

## 2010-05-02 NOTE — Letter (Signed)
Summary: Results Follow-up Letter  [facility]  [address]   [place], [place] [address]   Phone: [phone]  Fax: [phone]    06/15/2009  [address] [place], [place]  [address]  Dear [name],   The following are the results of your recent test(s): Patient: Laura Griffin Your labs are normal. Increasing citrus and other fruits and vegetables high in potassium has beneficial effects on your blood pressure  Tests: (1) Basic Metabolic Panel ([address])   Order Note: FASTING   Sodium                    142 mEq/L                   135-145   Potassium                 3.7 mEq/L                   3.5-5.3   Chloride                  105 mEq/L                   96-112   CO2                       26 mEq/L                    19-32   Glucose                   92 mg/dL                    96-29   BUN                       16 mg/dL                    5-28   Creatinine                0.92 mg/dL                  0.40-1.20   Calcium                   10.0 mg/dL                  4.1-32.4  Tests: (2) Lipid Profile ([address])   Cholesterol               149 mg/dL                   7-253     ATP III Classification:           < 200        mg/dL        Desirable          200 - 239     mg/dL        Borderline High          >= 240        mg/dL        High         Triglyceride              101 mg/dL                   <  150   HDL Cholesterol           46 mg/dL                    >04   Total Chol/HDL Ratio      3.2 Ratio  VLDL Cholesterol (Calc)                             20 mg/dL                    5-40  LDL Cholesterol (Calc)                             83 mg/dL                    9-81   Your LDL (bad) cholesterol is in a good range.     Document Creation Date: 06/14/2009 10:15 PM  Sincerely,  Zachery Dauer MD [facility]            Appended Document: Results Follow-up Letter mailed.

## 2010-05-02 NOTE — Assessment & Plan Note (Signed)
Summary: F/U VISIT/[facility]   Vital Signs:  Patient profile:   65 year old female Menstrual status:  postmenopausal Height:      64.25 inches Weight:      174 pounds Temp:     98.3 degrees F oral Pulse rate:   52 / minute BP sitting:   134 / 76  (right arm) Cuff size:   regular  Vitals Entered By: Tessie Fass CMA (June 07, 2009 1:58 PM) CC: F/U Is Patient Diabetic? No Pain Assessment Patient in pain? no        Primary Care Provider:  Zachery Dauer MD  CC:  F/U.  History of Present Illness: blood pressure 110-152/68-86 at home. She's been taking the increased dose of medication, but has been under great stress due to her husband's illness, sleeping in his room at the hospital, etc. Currently he's at the neurosurgeon's office who is upset because the radiology practice did a CT scan of the neck instead of the low back for the persistent right thigh pain.   Habits & Providers  Alcohol-Tobacco-Diet     Tobacco Status: quit  Current Medications (verified): 1)  Bayer Childrens Aspirin 81 Mg Chew (Aspirin) .... Take 1 Tablet By Mouth Once A Day 2)  Fish Oil 1000 Mg Caps (Omega-3 Fatty Acids) .... Take 2 Capsule By Mouth Once A Day 3)  Hyzaar 100-25 Mg Tabs (Losartan Potassium-Hctz) .... Take 1 Tab Daily 4)  Metoprolol Succinate 200 Mg Xr24h-Tab (Metoprolol Succinate) .... Take 1  in A.m. 5)  Multivitamins  Tabs (Multiple Vitamin) .... Take One Tablet Daily 6)  Osteo Bi-Flex Adv Double St  Tabs (Misc Natural Products) .... Take One Two Times A Day 7)  Combigan 0.2-0.5 % Soln (Brimonidine Tartrate-Timolol) .... Apply Two Drops Ou Two Times A Day 8)  Travatan Z 0.004 % Soln (Travoprost) .... One Drop Ou Every At Bedtime 9)  Amlodipine Besylate 10 Mg Tabs (Amlodipine Besylate) .... Take One Tablet Daily  Allergies (verified): 1)  Septra (Sulfamethoxazole-Trimethoprim) 2)  Lisinopril (Lisinopril)  Physical Exam  Lungs:  Normal respiratory effort, chest expands symmetrically. Lungs  are clear to auscultation, no crackles or wheezes. Heart:  Normal rate and regular rhythm. S1 and S2 normal    Extremities:  no edema Psych:  good eye contact and dysphoric affect.     Impression & Recommendations:  Problem # 1:  HYPERTENSION, BENIGN SYSTEMIC (ICD-401.1)  Continue medication same Her updated medication list for this problem includes:    Hyzaar 100-25 Mg Tabs (Losartan potassium-hctz) .[name]... Take 1 tab daily    Metoprolol Succinate 200 Mg Xr24h-tab (Metoprolol succinate) .[name]... Take 1  in a.m.    Amlodipine Besylate 10 Mg Tabs (Amlodipine besylate) .[name]... Take one tablet daily  Orders: [facility]- Est Level  3 ([address])Future Orders: Basic Met-[facility] ([id number]) ... 06/01/2010  Problem # 2:  OBESITY, NOS (ICD-278.00) Losing, probably related to stress.   Complete Medication List: 1)  Bayer Childrens Aspirin 81 Mg Chew (Aspirin) .... Take 1 tablet by mouth once a day 2)  Fish Oil 1000 Mg Caps (Omega-3 fatty acids) .... Take 2 capsule by mouth once a day 3)  Hyzaar 100-25 Mg Tabs (Losartan potassium-hctz) .... Take 1 tab daily 4)  Metoprolol Succinate 200 Mg Xr24h-tab (Metoprolol succinate) .... Take 1  in a.m. 5)  Multivitamins Tabs (Multiple vitamin) .... Take one tablet daily 6)  Osteo Bi-flex Adv Double St Tabs (Misc natural products) .... Take one two times a day 7)  Combigan 0.2-0.5 %  Soln (Brimonidine tartrate-timolol) .... Apply two drops ou two times a day 8)  Travatan Z 0.004 % Soln (Travoprost) .... One drop ou every at bedtime 9)  Amlodipine Besylate 10 Mg Tabs (Amlodipine besylate) .... Take one tablet daily  Other Orders: Future Orders: Lipid-[facility] ([id number]) ... 06/01/2010  Patient Instructions: 1)  Please schedule a follow-up appointment in 1 month.  2)  Please return for lab work next Tuesday before husbands visit.  3)  Continue medications the same. Prescriptions: METOPROLOL SUCCINATE 200 MG XR24H-TAB (METOPROLOL SUCCINATE) Take 1  in A.M.  #30 x 11    Entered and Authorized by:   Zachery Dauer MD   Signed by:   Zachery Dauer MD on 06/07/2009   Method used:   Electronically to        [facility] (retail)       [address]       [place], [place]  [id number]       Ph: [phone]       Fax: [phone]   RxID:   [id number]    Prevention & Chronic Care Immunizations   Influenza vaccine: Fluvax MCR  (01/17/2009)    Tetanus booster: 05/31/2004: Done.   Tetanus booster due: 06/01/2014    Pneumococcal vaccine: Pneumovax (Medicare)  (07/13/2008)    H. zoster vaccine: Not documented  Colorectal Screening   Hemoccult: Neg x 3  (01/13/2009)   Hemoccult due: Not Indicated    Colonoscopy: Done.  (08/31/2004)   Colonoscopy due: 08/31/2009  Other Screening   Pap smear: normal  (04/07/2007)   Pap smear due: Not Indicated    Mammogram: Assessment: BIRADS 1. Location: [facility].    (05/02/2009)   Mammogram action/deferral: Screening mammogram in 1 year.     (05/02/2009)   Mammogram due: 04/24/2009    DXA bone density scan:  Lumbar Spine:  T Score > -1.0 Spine.  Hip Total: T Score > -1.0 Hip.  Location:  [facility].      (05/18/2009)   DXA scan due: 05/18/2014    Smoking status: quit  (06/07/2009)  Lipids   Total Cholesterol: 164  (08/04/2007)   LDL: 94  (08/04/2007)   LDL Direct: 90  (12/28/2008)   HDL: 46  (08/04/2007)   Triglycerides: 118  (08/04/2007)  Hypertension   Last Blood Pressure: 134 / 76  (06/07/2009)   Serum creatinine: 0.87  (12/28/2008)   Serum potassium 3.8  (12/28/2008)    Hypertension flowsheet reviewed?: Yes   Progress toward BP goal: At goal  Self-Management Support :   Personal Goals (by the next clinic visit) :      Personal blood pressure goal: 140/90  (12/28/2008)   Hypertension self-management support: BP self-monitoring log  (12/28/2008)

## 2010-05-02 NOTE — Letter (Signed)
Summary: Results Follow-up Letter  [facility]  [address]   [place], [place] [address]   Phone: [phone]  Fax: [phone]    01/12/2010  [address] [place], [place]  [address]  Dear Ms. Paci,   The following are the results of your recent test(s):  Patient: Laura Griffin  Tests: (1) Basic Metabolic Panel (25956)   Sodium                    141 mEq/L                   135-145   Potassium                 4.0 mEq/L                   3.5-5.3   Chloride                  104 mEq/L                   96-112   CO2                       27 mEq/L                    19-32   Glucose                   91 mg/dL                    38-75   BUN                       17 mg/dL                    6-43   Creatinine                0.83 mg/dL                  0.40-1.20   Calcium              [H]  10.7 mg/dL                  3.2-95.1  Your kidney function and potassium are good. The slightly elevated Calcium shouldn't cause you any problems, so we'll just recheck it in a few months.   Document Creation Date: 01/11/2010 12:46 AM  Sincerely,  Zachery Dauer MD [facility]            Appended Document: Results Follow-up Letter mailed.

## 2010-05-02 NOTE — Assessment & Plan Note (Signed)
Summary: sore throat,df   Vital Signs:  Patient profile:   65 year old female Menstrual status:  postmenopausal Weight:      166.6 pounds Temp:     99.4 degrees F oral Pulse rate:   65 / minute BP sitting:   122 / 64  (left arm) Cuff size:   regular  Vitals Entered By: Arlyss Repress CMA, (October 31, 2009 11:18 AM) CC: sore throat started Saturday. cough and congestion started today. Is Patient Diabetic? No Pain Assessment Patient in pain? no        Primary Provider:  Zachery Dauer MD  CC:  sore throat started Saturday. cough and congestion started today.Laura Griffin  History of Present Illness: 65 yo female presents with sore throat and headache/congestion for past 2 days. Has nonproductive cough and feels facial fullness. No fevers, chills, ear pain, change in hearing or chest congestion. Painful to swallow. Has tried salt water gargles for throat pain and ibuprofen for HA. No sick contacts, has been stressed recently due to her husband being sick and hospitalized. Denies personal hx of recurrent respiratory infections.   Habits & Providers  Alcohol-Tobacco-Diet     Tobacco Status: quit  Allergies: 1)  Septra (Sulfamethoxazole-Trimethoprim) 2)  Lisinopril (Lisinopril)  Past History:  Past Medical History: Last updated: 01/13/2009 L sciatica 8/95 and 11/00   ct - lumbar L-4 radiculopathy - 10/31/1993 R subacromial injection - 09/19/1999 CT - sinuses - 10/31/2000 CT - Head nl - 01/28/2001 TSH - normal - 09/11/2002 BMI = 30.1 - 10/06/2002 Waist 40.5 inches - 12/18/2005 Xray -R knee, mild DJDSmall effusion - 03/11/2006 Steroid injection R knee - 04/25/2006 Pelvic ultrasound Dr Tresa Res - endometrial stripe 1.9 mmm, small calcified fibroids  Past Surgical History: Last updated: 01/13/2009 BTL 1973 Vulvar biopsy of hypopigmentation non-specific  Family History: Last updated: 07/16/2009 CA Breast sister died  and aunt died CVA - F died 06-23-98 age 61, htn Htn - M died Cerebral  hemorrhage age 52 Brother and 2 sisters her Brother in [place] Brother in [place]  Social History: Last updated: 2009-07-16 Husband - Fayrene Fearing; Son - [employer] career development. Plans to retire Aug 2010 Smoked 1/2 PPD x 15 yr quit 1989 Alcohol use-seldom Grandparent recently Retired  Social History: Smoking Status:  quit  Review of Systems  The patient denies fever and decreased hearing.         C/o sore throat, hoarseness, nonproductive cough.   Physical Exam  General:  Well-developed,well-nourished,in no acute distress; alert,appropriate and cooperative throughout examination Head:  normocephalic and atraumatic.   Eyes:  Mild conjunctival injection. EOMI Ears:  TMs without abnormality. External canals are without deformity.  Mouth:  Pharyngeal erythema without exudate or lesions. No ulcers.  Neck:  L anterior cervical lymphadenopathy Lungs:  CTAB. No wheezes, rales, or crackles.  Heart:  normal rate, regular rhythm, and no murmur.     Impression & Recommendations:  Problem # 1:  URI (ICD-465.9)  Viral etiology most likely. Will treat supportively with tessalon for cough/throat, ibuprofen and tylenol for ha, and recommended otc robitussin DM for congestion if desired. Explained that symptoms will likely last for another 1-2 weeks, and to f/u if she does not feel better after that time period.  Her updated medication list for this problem includes:    Bayer Childrens Aspirin 81 Mg Chew (Aspirin) .Laura Griffin... Take 1 tablet by mouth once a day    Tessalon Perles 100 Mg Caps (Benzonatate) .Laura Griffin... Take one po twice daily.  Orders: [facility]- Est Level  2 ([id number])  Complete Medication List: 1)  Bayer Childrens Aspirin 81 Mg Chew (Aspirin) .... Take 1 tablet by mouth once a day 2)  Fish Oil 1000 Mg Caps (Omega-3 fatty acids) .... Take 2 capsule by mouth once a day 3)  Hyzaar 100-25 Mg Tabs (Losartan potassium-hctz) .... Take 1 tab daily 4)  Metoprolol Succinate 200 Mg Xr24h-tab  (Metoprolol succinate) .... Take 1  in a.m. 5)  Multivitamins Tabs (Multiple vitamin) .... Take one tablet daily 6)  Osteo Bi-flex Adv Double St Tabs (Misc natural products) .... Take one two times a day 7)  Combigan 0.2-0.5 % Soln (Brimonidine tartrate-timolol) .... Apply two drops ou two times a day 8)  Travatan Z 0.004 % Soln (Travoprost) .... One drop ou every at bedtime 9)  Amlodipine Besylate 10 Mg Tabs (Amlodipine besylate) .... Take one tablet daily 10)  Tessalon Perles 100 Mg Caps (Benzonatate) .... Take one po twice daily.  Patient Instructions: 1)  Nice to meet you. 2)  You may try over the counter Robitussin DM for your congestion. You can use Tessalon for the sore throat/cough.  3)  Alternate tylenol (acetaminophen) and ibuprofen for you headache.  4)  If you do not feel better in the next week-10 days, please return to clinic.  Prescriptions: TESSALON PERLES 100 MG CAPS (BENZONATATE) Take one po twice daily.  #28 x 0   Entered and Authorized by:   Lloyd Huger MD   Signed by:   Lloyd Huger MD on 10/31/2009   Method used:   Electronically to        [facility] (retail)       [address]       [place], [place]  [id number]       Ph: [phone]       Fax: [phone]   RxID:   [id number]

## 2010-05-02 NOTE — Progress Notes (Signed)
Summary: triage  Phone Note Call from Patient Call back at Home Phone [phone]   Caller: Patient Summary of Call: was seen 1st part of this week for URI, woke up 2 days in a row w/ eyes bloodshot w/ milky drainage - not sure what she should do Initial call taken by: De Nurse,  November 04, 2009 9:13 AM  Follow-up for Phone Call        patient reports both eyes are red and has milky drainage and crusting when she wakes up in the morning. states cough and sore throat is improved some. will ask preceptor. Follow-up by: Theresia Lo RN,  November 04, 2009 9:17 AM  Additional Follow-up for Phone Call Additional follow up Details #1::        Please add her to the workin schedule for me to see quickly so I can also give her medicine for her husband Additional Follow-up by: Zachery Dauer MD,  November 04, 2009 9:59 AM

## 2010-05-04 NOTE — Miscellaneous (Signed)
Summary: prob list rev   

## 2010-05-14 ENCOUNTER — Other Ambulatory Visit: Payer: Self-pay | Admitting: Family Medicine

## 2010-05-14 NOTE — Telephone Encounter (Signed)
Refill request

## 2010-06-03 ENCOUNTER — Encounter: Payer: Self-pay | Admitting: Family Medicine

## 2010-06-06 ENCOUNTER — Ambulatory Visit (INDEPENDENT_AMBULATORY_CARE_PROVIDER_SITE_OTHER): Payer: Medicare Other | Admitting: Family Medicine

## 2010-06-06 DIAGNOSIS — R5383 Other fatigue: Secondary | ICD-10-CM

## 2010-06-06 DIAGNOSIS — I10 Essential (primary) hypertension: Secondary | ICD-10-CM

## 2010-06-06 DIAGNOSIS — R5381 Other malaise: Secondary | ICD-10-CM

## 2010-06-06 DIAGNOSIS — E669 Obesity, unspecified: Secondary | ICD-10-CM

## 2010-06-06 LAB — CBC
HCT: 37 % (ref 36.0–46.0)
Hemoglobin: 12.2 g/dL (ref 12.0–15.0)
MCHC: 33 g/dL (ref 30.0–36.0)
MCV: 88.5 fL (ref 78.0–100.0)
RDW: 13 % (ref 11.5–15.5)
WBC: 4.4 10*3/uL (ref 4.0–10.5)

## 2010-06-06 LAB — CONVERTED CEMR LAB
AST: 22 units/L (ref 0–37)
Albumin: 4.9 g/dL (ref 3.5–5.2)
Alkaline Phosphatase: 58 units/L (ref 39–117)
BUN: 16 mg/dL (ref 6–23)
Creatinine, Ser: 0.79 mg/dL (ref 0.40–1.20)
Glucose, Bld: 77 mg/dL (ref 70–99)
Hemoglobin: 12.2 g/dL (ref 12.0–15.0)
MCHC: 33 g/dL (ref 30.0–36.0)
MCV: 88.5 fL (ref 78.0–100.0)
Potassium: 3.6 meq/L (ref 3.5–5.3)
RDW: 13 % (ref 11.5–15.5)
Total Bilirubin: 0.7 mg/dL (ref 0.3–1.2)

## 2010-06-06 LAB — COMPREHENSIVE METABOLIC PANEL
ALT: 20 U/L (ref 0–35)
AST: 22 U/L (ref 0–37)
BUN: 16 mg/dL (ref 6–23)
Creat: 0.79 mg/dL (ref 0.40–1.20)
Total Bilirubin: 0.7 mg/dL (ref 0.3–1.2)

## 2010-06-06 MED ORDER — METOPROLOL SUCCINATE ER 100 MG PO TB24: 100.0000 mg | ORAL_TABLET | ORAL | Status: DC

## 2010-06-06 MED ORDER — AMLODIPINE BESYLATE 10 MG PO TABS: 10.0000 mg | ORAL_TABLET | Freq: Every day | ORAL | Status: DC

## 2010-06-06 NOTE — Patient Instructions (Addendum)
Decrease the Metoprolol to 100 mg daily  I will call if the lab tests are abnormal.  blood pressure goal is always under 140/90  Recheck in 2 months if labs ok.

## 2010-06-07 ENCOUNTER — Encounter: Payer: Self-pay | Admitting: Family Medicine

## 2010-06-07 NOTE — Progress Notes (Signed)
  Subjective:    Patient ID: Laura Griffin, female    DOB: [birth year], 65 y.o.   MRN: [id number]  HPI She has been feeling tired since she was spending a lot of time in [place] while her husband was being treated for for Multiple Myeloma. She was walking a lot there while waiting when he was treated. Not getting much exercise now. Still worries about how he'll do, though the reports thus far are good. His R hip pain is preventing preventing him from getting out of the house much. Thinks she has regained the weight that she lost.  She is taking her blood pressure meds. Pressures at home 99-155/67-78     Review of Systems     Objective:   Physical Exam  Constitutional: She appears well-developed and well-nourished.  Cardiovascular: Normal rate, regular rhythm and normal heart sounds.   No murmur heard. Pulmonary/Chest: Effort normal and breath sounds normal.  Musculoskeletal: She exhibits no edema.          Assessment & Plan:

## 2010-06-07 NOTE — Assessment & Plan Note (Addendum)
Over controlled on current meds, try her on lower dose of beta blocker to see if it improves the fatigue

## 2010-06-07 NOTE — Assessment & Plan Note (Signed)
Encouraged restarting diet and regular exercise.

## 2010-06-07 NOTE — Assessment & Plan Note (Signed)
Check CBC and BMET

## 2010-06-07 NOTE — Assessment & Plan Note (Signed)
Recheck in BMET

## 2010-06-12 ENCOUNTER — Encounter: Payer: Self-pay | Admitting: Home Health Services

## 2010-07-18 ENCOUNTER — Encounter: Payer: Self-pay | Admitting: Family Medicine

## 2010-08-18 NOTE — Op Note (Signed)
[name], Laura Griffin            ACCOUNT NO.:  [id number]   MEDICAL RECORD NO.:  [id number]          PATIENT TYPE:  AMB   LOCATION:  ENDO                         FACILITY:  [facility]   PHYSICIAN:  Bernette Redbird, M.D.   DATE OF BIRTH:  [birth year]   DATE OF PROCEDURE:  09/12/2004  DATE OF DISCHARGE:                                 OPERATIVE REPORT   PROCEDURE:  Colonoscopy with biopsy.   INDICATION:  65 year old female without worrisome risk factors or symptoms  and no prior colon cancer screening procedures, for initial colon cancer  screening exam.   FINDINGS:  Diminutive rectal polyps. Moderate diverticulosis.   PROCEDURE:  The nature, purpose and risks of the procedure had been  discussed with the patient who provided written consent. Sedation was  fentanyl 50 mcg and Versed 5 milligrams IV without arrhythmias or  desaturation. The Olympus colonoscope (I believe adult adjustable tension)  was advanced to the cecum as identified by clear visualization of  appendiceal orifice and pullback was then performed. The quality of prep was  excellent. It was felt that all areas were well seen.   There was some scattered diverticulosis. In the rectum there were some tiny  2-3 mm sessile hyperplastic appearing polyps, several of which were  biopsied. There were probably 10 or 15 such polyps and probably about 6 of  them were biopsied. No large polyps, cancer, colitis or vascular  malformations were observed. I did not retroflex in the rectum. Reinspection  of the rectum disclosed no additional findings. The patient tolerated the  procedure well and there were no apparent complications.   IMPRESSION:  1.  Diminutive rectal polyps of uncertain clinical significance, path      pending.  2.  Scattered diverticulosis.   PLAN:  Await pathology results with timing and type a follow-up to depend on  the histologic findings.       RB/MEDQ  D:  09/12/2004  T:  09/12/2004  Job:  [id number]   cc:   Deniece Portela A. Sheffield Slider, M.D.  Fax: [phone]

## 2010-10-03 ENCOUNTER — Encounter: Payer: Self-pay | Admitting: Family Medicine

## 2010-10-03 ENCOUNTER — Ambulatory Visit (INDEPENDENT_AMBULATORY_CARE_PROVIDER_SITE_OTHER): Payer: Medicare Other | Admitting: Family Medicine

## 2010-10-03 DIAGNOSIS — I10 Essential (primary) hypertension: Secondary | ICD-10-CM

## 2010-10-03 DIAGNOSIS — R5381 Other malaise: Secondary | ICD-10-CM

## 2010-10-03 DIAGNOSIS — F329 Major depressive disorder, single episode, unspecified: Secondary | ICD-10-CM

## 2010-10-03 DIAGNOSIS — D649 Anemia, unspecified: Secondary | ICD-10-CM

## 2010-10-03 DIAGNOSIS — R279 Unspecified lack of coordination: Secondary | ICD-10-CM

## 2010-10-03 DIAGNOSIS — R270 Ataxia, unspecified: Secondary | ICD-10-CM

## 2010-10-03 DIAGNOSIS — G2581 Restless legs syndrome: Secondary | ICD-10-CM

## 2010-10-03 DIAGNOSIS — R5383 Other fatigue: Secondary | ICD-10-CM

## 2010-10-03 MED ORDER — ROPINIROLE HCL 0.5 MG PO TABS: ORAL_TABLET | ORAL | Status: DC

## 2010-10-03 MED ORDER — ROPINIROLE HCL 1 MG PO TABS: ORAL_TABLET | ORAL | Status: DC

## 2010-10-03 MED ORDER — CITALOPRAM HYDROBROMIDE 20 MG PO TABS: 20.0000 mg | ORAL_TABLET | Freq: Every day | ORAL | Status: DC

## 2010-10-03 NOTE — Patient Instructions (Signed)
Start the Citalopram 20 each AM now  After a week if feeling OK on that medicine and still having restless leg symptoms start the ropinirole with the 0.25 mg dose.   Recheck in one month

## 2010-10-03 NOTE — Assessment & Plan Note (Addendum)
Situational depression. Will start Citalopram

## 2010-10-03 NOTE — Assessment & Plan Note (Signed)
well controlled  

## 2010-10-04 ENCOUNTER — Encounter: Payer: Self-pay | Admitting: Family Medicine

## 2010-10-04 LAB — VITAMIN B12: Vitamin B-12: 922 pg/mL — ABNORMAL HIGH (ref 211–911)

## 2010-10-04 LAB — TSH: TSH: 1.196 u[IU]/mL (ref 0.350–4.500)

## 2010-10-04 NOTE — Progress Notes (Signed)
  Subjective:    Patient ID: Laura Griffin, female    DOB: 1945-11-02, 65 y.o.   MRN: [id number]  Hypertension Pertinent negatives include no chest pain.  Feels tired all of the time. Sleeps poorly some nights, so likes to sleep in to 8-9 AM when she can.  Appetite good. Nocturia x 3-4, but tries to drink a lot of liquids and doesn't restrict them in the evenings. Some difficulty going to sleep due to electric feeling in lower legs from 9-10 PM that makes her get up to walk it off.  Admits the stress of her husband's myeloma is getting her down. He is in so much pain from his right hip arthritis, and she worries about MM recurrence. At the time of his bone marrow autologous transplant, they were to told recurrence was possible within 5 years.  Does enjoy being a grandmother to two grandsons of her only son, who lives here. Gets along well with his wife   Review of Systems  Constitutional: Negative for unexpected weight change.  Respiratory: Negative for apnea.   Cardiovascular: Negative for chest pain and leg swelling.  Musculoskeletal: Positive for back pain. Negative for arthralgias.  Neurological: Negative for numbness.  Psychiatric/Behavioral: Negative for suicidal ideas.       Objective:   Physical Exam  Constitutional: She is oriented to person, place, and time. She appears well-developed and well-nourished.  Neck: No thyromegaly present.  Cardiovascular: Normal rate and regular rhythm.   Pulmonary/Chest: Effort normal and breath sounds normal.  Abdominal: Soft. Bowel sounds are normal.  Musculoskeletal: She exhibits no edema.  Neurological: She is alert and oriented to person, place, and time. Coordination abnormal.       Romberg steady, but tandem stance unsteady  Skin: Skin is warm and dry.          Assessment & Plan:

## 2010-10-04 NOTE — Assessment & Plan Note (Signed)
Borderline, will check ferritin given restless leg sx

## 2010-10-04 NOTE — Assessment & Plan Note (Signed)
Mildly unstable Romberg test

## 2010-10-23 ENCOUNTER — Ambulatory Visit (INDEPENDENT_AMBULATORY_CARE_PROVIDER_SITE_OTHER): Payer: Medicare Other | Admitting: Home Health Services

## 2010-10-23 ENCOUNTER — Encounter: Payer: Self-pay | Admitting: Home Health Services

## 2010-10-23 VITALS — BP 115/72 | Temp 98.0°F | Ht 64.0 in | Wt 171.0 lb

## 2010-10-23 DIAGNOSIS — Z0000 Encounter for general adult medical examination without abnormal findings: Secondary | ICD-10-CM

## 2010-10-23 NOTE — Progress Notes (Signed)
  Subjective:    Patient ID: Laura Griffin, female    DOB: [birth year], 65 y.o.   MRN: [id number]  HPI    Review of Systems     Objective:   Physical Exam        Assessment & Plan:  I have reviewed this visit and discussed with Laura Griffin and agree with her documentation. Laura Griffin A. Sheffield Slider, MD

## 2010-10-23 NOTE — Progress Notes (Signed)
Patient here for annual wellness visit, patient reports: Risk Factors/Conditions needing evaluation or treatment: Pt does not have any risk factors that need evaluation. Home Safety: Pt lives with husband in 1 story home.  Pt reports having smoke detectors but does not have adaptive equipment in bathroom. Other Information: Corrective lens: Pt wears daily corrective lens and visits eye doctor every 6 months to monitor glaucoma and cataracts.  Dentures: Pt does not have dentures and visits dentist every 6 months. Memory: Pt reports some memory loss. [name] Mini Mental Score (recorded in doc. flowsheet): 30  Balance/Gait:  Balance Abnormal Patient value  Sitting balance    Sit to stand x Uses arms  Attempts to arise    Immediate standing balance    Standing balance    Nudge    Eyes closed- Romberg    Tandem stance    Back lean    Neck Rotation    360 degree turn    Sitting down x Uses arms   Gait Abnormal Patient value  Initiation of gait x > 5 seconds  Step length-left    Step length-right    Step height-left    Step height-right    Step symmetry    Step continuity    Path deviation    Trunk movement x stiff  Walking stance        Annual Wellness Visit Requirements Recorded Today In  Medical, family, social history Past Medical, Family, Social History Section  Current providers Care team  Current medications Medications  Wt, BP, Ht, BMI Vital signs  Hearing assessment (welcome visit) Hearing/vision  Tobacco, alcohol, illicit drug use History  ADL Nurse Assessment  Depression Screening Nurse Assessment  Cognitive impairment Nurse Assessment  Mini Mental Status Document Flowsheet  Fall Risk Nurse Assessment  Home Safety Progress Note  End of Life Planning (welcome visit) Social Documentation  Medicare preventative services Progress Note  Risk factors/conditions needing evaluation/treatment Progress Note  Personalized health advice Patient Instructions, goals,  letter  Diet & Exercise Social Documentation  Emergency Contact Social Documentation  Seat Belts Social Documentation  Sun exposure/protection Social Documentation    Prevention Plan: Recommended pt contact pharmacy for shingles vaccine.  Recommended Medicare Prevention Screenings Women over 33 Test For Frequency Date of Last- BOLD if needed  Breast Cancer 1-2 yrs 5/12  Cervical Cancer 1-3 yrs 2012  Colorectal Cancer 1-10 yrs 2066  Osteoporosis once 2011  Cholesterol 5 yrs 2011  Diabetes yearly Non diabetic  HIV yearly declined  Influenza Shot yearly 2011  Pneumonia Shot once 4/10  Zostavax Shot once recommended

## 2010-10-23 NOTE — Patient Instructions (Signed)
1. Consider walking 3 times a week for 15-30 minutes. 2. Continue to eat a variety of foods, focusing on vegetables. 3. Bring in a copy of your medical wishes to Dr. Sheffield Slider. 4. Consider contacting pharmacy for shingles vaccine.

## 2010-11-07 ENCOUNTER — Encounter: Payer: Self-pay | Admitting: Family Medicine

## 2010-11-07 ENCOUNTER — Ambulatory Visit (INDEPENDENT_AMBULATORY_CARE_PROVIDER_SITE_OTHER): Payer: Medicare Other | Admitting: Family Medicine

## 2010-11-07 DIAGNOSIS — E663 Overweight: Secondary | ICD-10-CM

## 2010-11-07 DIAGNOSIS — F3289 Other specified depressive episodes: Secondary | ICD-10-CM

## 2010-11-07 DIAGNOSIS — Z6825 Body mass index (BMI) 25.0-25.9, adult: Secondary | ICD-10-CM

## 2010-11-07 DIAGNOSIS — F329 Major depressive disorder, single episode, unspecified: Secondary | ICD-10-CM

## 2010-11-07 DIAGNOSIS — I10 Essential (primary) hypertension: Secondary | ICD-10-CM

## 2010-11-07 NOTE — Patient Instructions (Signed)
Continue your medications the same. Start walking regularly.   Recheck in one month

## 2010-11-09 NOTE — Progress Notes (Signed)
  Subjective:    Patient ID: Laura Griffin, female    DOB: 10-26-1945, 65 y.o.   MRN: [id number]  HPINot feeling less depressed yet, but is sleeping better. Didn't get Ropinarol due to improvement in RLS symptoms. Is planning to start walking with a friend. Has discussed her feelings some with her husband. He still gets irritable due to his hip pain.   Not lightheaded  Review of Systems     Objective:   Physical Exam  Psychiatric:       Her affect is a little brighter. Able to smile          Assessment & Plan:

## 2010-11-09 NOTE — Assessment & Plan Note (Signed)
well controlled Watch for [facility] symptoms

## 2010-11-09 NOTE — Assessment & Plan Note (Signed)
Improved sleep. I encouraged exercise to help resolve depression. Continue medication the same

## 2010-11-09 NOTE — Assessment & Plan Note (Signed)
Has been below obese range for over one year

## 2010-11-21 ENCOUNTER — Encounter: Payer: Self-pay | Admitting: Family Medicine

## 2010-12-12 ENCOUNTER — Ambulatory Visit (INDEPENDENT_AMBULATORY_CARE_PROVIDER_SITE_OTHER): Payer: Medicare Other | Admitting: Family Medicine

## 2010-12-12 VITALS — BP 126/77 | HR 48 | Ht 65.0 in | Wt 167.0 lb

## 2010-12-12 DIAGNOSIS — R5381 Other malaise: Secondary | ICD-10-CM

## 2010-12-12 DIAGNOSIS — R5383 Other fatigue: Secondary | ICD-10-CM

## 2010-12-12 DIAGNOSIS — F329 Major depressive disorder, single episode, unspecified: Secondary | ICD-10-CM

## 2010-12-12 DIAGNOSIS — I10 Essential (primary) hypertension: Secondary | ICD-10-CM

## 2010-12-12 MED ORDER — ZOSTER VACCINE LIVE 19400 UNT/0.65ML ~~LOC~~ SOLR: 0.6500 mL | Freq: Once | SUBCUTANEOUS | Status: DC

## 2010-12-12 MED ORDER — CITALOPRAM HYDROBROMIDE 20 MG PO TABS: 20.0000 mg | ORAL_TABLET | Freq: Every day | ORAL | Status: DC

## 2010-12-12 NOTE — Assessment & Plan Note (Signed)
Improved on Citalopram

## 2010-12-12 NOTE — Progress Notes (Signed)
  Subjective:    Patient ID: Laura Griffin, female    DOB: 07-19-1945, 65 y.o.   MRN: [id number]  HPI She is feeling better on the citalopram sleeping better noticed no side effects does need refills. Happy that her husband is doing better but agrees that he needs his right hip replaced. She doesn't note he has some short-term memory problems recently.  Her blood pressures at home have been systolic 101-139 and diastolic 66-80  She is walking 2-3 days weekly x30 minutes    Review of Systems     Objective:   Physical Exam  Constitutional:       Generalized obesity  Cardiovascular: Normal rate and regular rhythm.   Psychiatric: She has a normal mood and affect. Her behavior is normal. Judgment and thought content normal.          Assessment & Plan:

## 2010-12-12 NOTE — Patient Instructions (Signed)
Continue your medications the same  Return to see Dr Sheffield Slider in 2 months.

## 2010-12-12 NOTE — Assessment & Plan Note (Signed)
well controlled  

## 2011-02-01 HISTORY — PX: CATARACT EXTRACTION: SUR2

## 2011-02-20 ENCOUNTER — Encounter: Payer: Self-pay | Admitting: Family Medicine

## 2011-02-20 ENCOUNTER — Ambulatory Visit (INDEPENDENT_AMBULATORY_CARE_PROVIDER_SITE_OTHER): Payer: Medicare Other | Admitting: Family Medicine

## 2011-02-20 VITALS — BP 114/74 | HR 53 | Ht 65.5 in | Wt 165.0 lb

## 2011-02-20 DIAGNOSIS — F329 Major depressive disorder, single episode, unspecified: Secondary | ICD-10-CM

## 2011-02-20 DIAGNOSIS — F3289 Other specified depressive episodes: Secondary | ICD-10-CM

## 2011-02-20 DIAGNOSIS — Z23 Encounter for immunization: Secondary | ICD-10-CM

## 2011-02-20 DIAGNOSIS — I10 Essential (primary) hypertension: Secondary | ICD-10-CM

## 2011-02-20 NOTE — Assessment & Plan Note (Signed)
well controlled  

## 2011-02-20 NOTE — Patient Instructions (Signed)
Please return to see Dr Sheffield Slider in 2 months.

## 2011-02-20 NOTE — Assessment & Plan Note (Signed)
improved

## 2011-02-20 NOTE — Progress Notes (Signed)
  Subjective:    Patient ID: Laura Griffin, female    DOB: January 06, 1946, 65 y.o.   MRN: [id number]  HPI her fatigue is improved. She sleeps better most night. It had some family concerns including her brother on [place]. Also people are working on their house. She very much enjoys helping take care of her grandchildren while her daughter-in-law goes to school. They due tire her out however. She feels that her depression is better but is agreeable to continue the medicine longer.  She had cataract surgery on her left eye yesterday and is blurry today she is using 3 additional drops including prednisolone and Vigamox. She will have the other eye cataract removal on December 10  She's been getting very little exercise portion because of taking her husband to his therapy.  Her blood pressure readings have been good at home      Review of Systems     Objective:   Physical Exam she is only mildly fatigued appearing Chest clear Heart regular rhythm without murmur Ankles no edema        Assessment & Plan:

## 2011-02-21 ENCOUNTER — Other Ambulatory Visit: Payer: Self-pay | Admitting: Family Medicine

## 2011-02-21 ENCOUNTER — Encounter: Payer: Self-pay | Admitting: Family Medicine

## 2011-02-21 DIAGNOSIS — I10 Essential (primary) hypertension: Secondary | ICD-10-CM

## 2011-02-21 LAB — BASIC METABOLIC PANEL
Calcium: 10.7 mg/dL — ABNORMAL HIGH (ref 8.4–10.5)
Glucose, Bld: 79 mg/dL (ref 70–99)
Sodium: 142 mEq/L (ref 135–145)

## 2011-02-21 MED ORDER — LOSARTAN POTASSIUM 100 MG PO TABS: 100.0000 mg | ORAL_TABLET | Freq: Every day | ORAL | Status: DC

## 2011-04-06 DIAGNOSIS — Z961 Presence of intraocular lens: Secondary | ICD-10-CM | POA: Diagnosis not present

## 2011-04-24 ENCOUNTER — Ambulatory Visit (INDEPENDENT_AMBULATORY_CARE_PROVIDER_SITE_OTHER): Payer: Medicare Other | Admitting: Family Medicine

## 2011-04-24 ENCOUNTER — Encounter: Payer: Self-pay | Admitting: Family Medicine

## 2011-04-24 VITALS — BP 123/79 | HR 54 | Ht 65.0 in | Wt 163.0 lb

## 2011-04-24 DIAGNOSIS — I10 Essential (primary) hypertension: Secondary | ICD-10-CM | POA: Diagnosis not present

## 2011-04-24 DIAGNOSIS — M5430 Sciatica, unspecified side: Secondary | ICD-10-CM | POA: Diagnosis not present

## 2011-04-24 DIAGNOSIS — M545 Low back pain, unspecified: Secondary | ICD-10-CM | POA: Diagnosis not present

## 2011-04-24 DIAGNOSIS — M1990 Unspecified osteoarthritis, unspecified site: Secondary | ICD-10-CM | POA: Diagnosis not present

## 2011-04-24 DIAGNOSIS — F329 Major depressive disorder, single episode, unspecified: Secondary | ICD-10-CM | POA: Diagnosis not present

## 2011-04-24 NOTE — Patient Instructions (Signed)
Take Ibuprofen 200 mg 3 tablets three times daily for 5 days  Please come back fasting for lab tests and I will call with the results.  Please return to see Dr Sheffield Slider in 1 month

## 2011-04-25 ENCOUNTER — Other Ambulatory Visit: Payer: Medicare Other

## 2011-04-25 LAB — COMPREHENSIVE METABOLIC PANEL
ALT: 14 U/L (ref 0–35)
CO2: 26 mEq/L (ref 19–32)
Sodium: 141 mEq/L (ref 135–145)
Total Bilirubin: 0.6 mg/dL (ref 0.3–1.2)
Total Protein: 7.1 g/dL (ref 6.0–8.3)

## 2011-04-25 NOTE — Assessment & Plan Note (Signed)
Consider hyperparathyroidism as cause of bone pains

## 2011-04-25 NOTE — Assessment & Plan Note (Signed)
well controlled  

## 2011-04-25 NOTE — Assessment & Plan Note (Signed)
improving

## 2011-04-25 NOTE — Progress Notes (Signed)
  Subjective:    Patient ID: Laura Griffin, female    DOB: 1945/12/24, 66 y.o.   MRN: [id number]  HPI She is gradual onset over the past couple months of joint pains and weakness. She is finding it difficult to stand from a chair. She has pain particularly in the right shoulder with difficulty raising that arm. Which is carry something the right arm will drop at times but she denies any numbness or weakness in the hand. Her right knee is been more painful and swollen recently. She has brief sharp pains in the right flank. She denies any pain in her neck. She does have some low back pain and feels that it radiates into her right leg.  She is feeling less depressed and is sleeping well. Her husband is less irritable and that his pain is relieved after his hip surgery.  Review of Systems     Objective:   Physical Exam  Constitutional: She is oriented to person, place, and time. She appears well-developed and well-nourished.  Neck:       Decreased range of motion, most limited turning to the left  Cardiovascular: Normal rate and regular rhythm.   Pulmonary/Chest: Effort normal and breath sounds normal.  Abdominal: Soft. She exhibits no distension and no mass. There is no guarding.  Musculoskeletal:       Painful arc on raising the right arm laterally. Tenderness in the supraspinatous area. Negative empty can and drop test  Right knee is swollen anterior laterally but no generalized effusion. Tender along the right lateral joint line  Full range of motion of hips without apparent pain no pain or swelling of ankles or wrists. Minimal DJD changes in  the hands  Her low back and decreased range of motion but no acute pain with movement.  Neurological: She is alert and oriented to person, place, and time.       She had difficulty standing from sitting position without using her arms  sitting root test cause some discomfort on the right Deep tendon reflexes were 1+ in the knees    Psychiatric:       Less depressed affect than in the past          Assessment & Plan:

## 2011-04-25 NOTE — Assessment & Plan Note (Signed)
Consider development of lumbar spinal stenosis and possibly also cervical

## 2011-04-25 NOTE — Progress Notes (Signed)
cmp and pth done today [facility] Madeleyn Schwimmer

## 2011-04-25 NOTE — Assessment & Plan Note (Signed)
Most likely cause of her pain around joints

## 2011-04-27 ENCOUNTER — Encounter: Payer: Self-pay | Admitting: Family Medicine

## 2011-05-02 ENCOUNTER — Encounter: Payer: Self-pay | Admitting: Family Medicine

## 2011-05-08 ENCOUNTER — Encounter: Payer: Self-pay | Admitting: Family Medicine

## 2011-05-24 ENCOUNTER — Ambulatory Visit (INDEPENDENT_AMBULATORY_CARE_PROVIDER_SITE_OTHER): Payer: Medicare Other | Admitting: Family Medicine

## 2011-05-24 VITALS — BP 154/71 | HR 54 | Temp 98.1°F | Ht 65.0 in | Wt 165.0 lb

## 2011-05-24 DIAGNOSIS — M7061 Trochanteric bursitis, right hip: Secondary | ICD-10-CM | POA: Insufficient documentation

## 2011-05-24 DIAGNOSIS — M7551 Bursitis of right shoulder: Secondary | ICD-10-CM

## 2011-05-24 DIAGNOSIS — M76899 Other specified enthesopathies of unspecified lower limb, excluding foot: Secondary | ICD-10-CM | POA: Diagnosis not present

## 2011-05-24 DIAGNOSIS — M719 Bursopathy, unspecified: Secondary | ICD-10-CM | POA: Diagnosis not present

## 2011-05-24 DIAGNOSIS — M5412 Radiculopathy, cervical region: Secondary | ICD-10-CM

## 2011-05-24 DIAGNOSIS — I10 Essential (primary) hypertension: Secondary | ICD-10-CM

## 2011-05-24 DIAGNOSIS — M67919 Unspecified disorder of synovium and tendon, unspecified shoulder: Secondary | ICD-10-CM | POA: Diagnosis not present

## 2011-05-24 HISTORY — DX: Radiculopathy, cervical region: M54.12

## 2011-05-24 NOTE — Patient Instructions (Signed)
Return in 2 -3 weeks to see Dr Sheffield Slider. Take Advil 200 mg 3 tabs tid with food until the steroid shot improves the leg pain  Sleep on your back with good neck support.  Do shoulder exercises. We'll consider injection if it doesn't improve.   Continue to avoid salt to keep your blood pressure lower.

## 2011-05-24 NOTE — Assessment & Plan Note (Addendum)
She agrees to steroid injection. After consent completed and sterile preparation with betadine and alcohol, using coolant spray anesthesia, the bursa was injected with 1cc of Aristospan and 6 cc of Lidocaine. May use Ibuprofen for short-term additional pain relief

## 2011-05-24 NOTE — Assessment & Plan Note (Signed)
She declines steroid injection due to pain of steroid flare last time. She was given range of motion exercise sheet for the shoulder.

## 2011-05-24 NOTE — Assessment & Plan Note (Signed)
Elevated today, but has been well controlled previously here and at home.

## 2011-05-24 NOTE — Progress Notes (Signed)
  Subjective:    Patient ID: Laura Griffin, female    DOB: 1945-11-05, 66 y.o.   MRN: [id number]  HPIher right flank pain has resolved, but she continues pain in the right upper leg when standing. Asked to move from her usual right side in lying in bed due to discomfort in the leg. 5 days of Advil helped but did not last. Her right knee is painful when the weather is bad. 2 substance of the feeling of like to see a running through the right leg.   She also has her pain in the right upper arm causing her to drop things at times. Has some decreased range of motion of the right shoulder due to discomfort. She denies numbness in the fingers or significant neck pain.   Measurement of blood pressures at home with systolics in the 120 to 130 range    Review of Systems     Objective:   Physical Exam  Constitutional:       Generalized obesity   Neck: Normal range of motion. Neck supple.  Cardiovascular: Normal rate and regular rhythm.   Pulmonary/Chest: Effort normal and breath sounds normal.  Musculoskeletal:       Slumped posture  Mildly painful arc right shoulder. Tender subacromial area Negative impingement and drop tests Full internal and external right shoulder rotation  Head forward posture. Negative Spurlings.   Tender over right greater trochanter. Full range of motion of right hip. Neg SLR          Assessment & Plan:

## 2011-05-30 ENCOUNTER — Encounter (INDEPENDENT_AMBULATORY_CARE_PROVIDER_SITE_OTHER): Payer: Medicare Other | Admitting: Ophthalmology

## 2011-05-30 DIAGNOSIS — H3540 Unspecified peripheral retinal degeneration: Secondary | ICD-10-CM | POA: Diagnosis not present

## 2011-05-30 DIAGNOSIS — H4010X0 Unspecified open-angle glaucoma, stage unspecified: Secondary | ICD-10-CM | POA: Diagnosis not present

## 2011-05-30 DIAGNOSIS — H43819 Vitreous degeneration, unspecified eye: Secondary | ICD-10-CM

## 2011-05-30 DIAGNOSIS — H35039 Hypertensive retinopathy, unspecified eye: Secondary | ICD-10-CM

## 2011-05-30 DIAGNOSIS — I10 Essential (primary) hypertension: Secondary | ICD-10-CM

## 2011-06-05 ENCOUNTER — Ambulatory Visit (INDEPENDENT_AMBULATORY_CARE_PROVIDER_SITE_OTHER): Payer: Medicare Other | Admitting: Family Medicine

## 2011-06-05 ENCOUNTER — Ambulatory Visit
Admission: RE | Admit: 2011-06-05 | Discharge: 2011-06-05 | Disposition: A | Discharge status: Home / Self Care | Payer: Medicare Other | Source: Ambulatory Visit | Attending: Family Medicine | Admitting: Family Medicine

## 2011-06-05 ENCOUNTER — Encounter: Payer: Self-pay | Admitting: Family Medicine

## 2011-06-05 VITALS — BP 134/80 | HR 52 | Temp 97.8°F | Ht 65.0 in | Wt 162.5 lb

## 2011-06-05 DIAGNOSIS — R5383 Other fatigue: Secondary | ICD-10-CM

## 2011-06-05 DIAGNOSIS — R5381 Other malaise: Secondary | ICD-10-CM | POA: Diagnosis not present

## 2011-06-05 DIAGNOSIS — M7551 Bursitis of right shoulder: Secondary | ICD-10-CM

## 2011-06-05 DIAGNOSIS — I10 Essential (primary) hypertension: Secondary | ICD-10-CM

## 2011-06-05 DIAGNOSIS — M7061 Trochanteric bursitis, right hip: Secondary | ICD-10-CM

## 2011-06-05 DIAGNOSIS — M47812 Spondylosis without myelopathy or radiculopathy, cervical region: Secondary | ICD-10-CM | POA: Diagnosis not present

## 2011-06-05 DIAGNOSIS — M5030 Other cervical disc degeneration, unspecified cervical region: Secondary | ICD-10-CM | POA: Diagnosis not present

## 2011-06-05 DIAGNOSIS — M76899 Other specified enthesopathies of unspecified lower limb, excluding foot: Secondary | ICD-10-CM

## 2011-06-05 DIAGNOSIS — M79609 Pain in unspecified limb: Secondary | ICD-10-CM | POA: Diagnosis not present

## 2011-06-05 DIAGNOSIS — M719 Bursopathy, unspecified: Secondary | ICD-10-CM

## 2011-06-05 DIAGNOSIS — F329 Major depressive disorder, single episode, unspecified: Secondary | ICD-10-CM

## 2011-06-05 MED ORDER — TRAMADOL HCL 50 MG PO TABS: 50.0000 mg | ORAL_TABLET | Freq: Four times a day (QID) | ORAL | Status: AC | PRN

## 2011-06-05 MED ORDER — CITALOPRAM HYDROBROMIDE 20 MG PO TABS: 20.0000 mg | ORAL_TABLET | ORAL | Status: DC

## 2011-06-05 NOTE — Assessment & Plan Note (Signed)
Not ideal. She will work on further weight loss and salt restriction

## 2011-06-05 NOTE — Assessment & Plan Note (Signed)
Improved. She will try every other day Citalopram

## 2011-06-05 NOTE — Patient Instructions (Signed)
Add the Tramadol to your Acetominaphen as needed for neck and arm pain.   Decrease the Citalopram to one tab every other day and discontinue in couple weeks. Call if you want the 10 mg tablets.   Please return to see Dr Sheffield Slider in 2 months.  Avoid sodium

## 2011-06-05 NOTE — Assessment & Plan Note (Signed)
Cervical spine film DDD and DJD. Reinforced support pillow, sleeping on back, posture. Tramadol added for pain which should be OK since she is on decreased SSRI.

## 2011-06-05 NOTE — Assessment & Plan Note (Signed)
Improved, but may be recurring

## 2011-06-05 NOTE — Progress Notes (Signed)
  Subjective:    Patient ID: Laura Griffin, female    DOB: 07-Sep-1945, 66 y.o.   MRN: [id number]  HPIHer right shoulder is no longer painful, but she has pain and tightness in the right mid trapeziums muscle and a cramping pain down into the right thumb and index finger. No weakness, bowel, or bladder symptoms.   Her right hip was better after the injection, but began hurting again yesterday.   Her depression remains improved and she'd like to try getting off the Citalopram  She's lost a little weight chasing her grandchildren. Eats out a lot, so probably could further restrict her sodium.   blood pressures at home 114-159/73-84. Higher in the evenings and when she is having pain.     Review of Systems     Objective:   Physical Exam  Constitutional: She is oriented to person, place, and time. She appears well-developed and well-nourished.  Neck: No thyromegaly present.       Tender and tight right mid trapezius  Cardiovascular: Normal rate.   Pulmonary/Chest: Effort normal and breath sounds normal.  Musculoskeletal: She exhibits no edema.       Head forward position Mildly decreased range of motion of neck Neg Spurlings   Lymphadenopathy:    She has no cervical adenopathy.  Neurological: She is alert and oriented to person, place, and time. No cranial nerve deficit.       Arm and hand strength normal No subjective numbness  Psychiatric: She has a normal mood and affect.          Assessment & Plan:

## 2011-06-20 ENCOUNTER — Encounter: Payer: Self-pay | Admitting: Family Medicine

## 2011-06-20 ENCOUNTER — Ambulatory Visit (INDEPENDENT_AMBULATORY_CARE_PROVIDER_SITE_OTHER): Payer: Medicare Other | Admitting: Family Medicine

## 2011-06-20 VITALS — BP 116/71 | HR 60 | Temp 97.9°F | Ht 65.0 in | Wt 162.7 lb

## 2011-06-20 DIAGNOSIS — H5710 Ocular pain, unspecified eye: Secondary | ICD-10-CM

## 2011-06-20 DIAGNOSIS — H109 Unspecified conjunctivitis: Secondary | ICD-10-CM | POA: Diagnosis not present

## 2011-06-20 MED ORDER — TOBRAMYCIN 0.3 % OP SOLN: 1.0000 [drp] | Freq: Four times a day (QID) | OPHTHALMIC | Status: AC

## 2011-06-20 NOTE — Progress Notes (Signed)
  Subjective:    Patient ID: Laura Griffin, female    DOB: Jan 25, 1946, 66 y.o.   MRN: [id number]  HPI Patient here today with complaint of left ear, eye, throat pain. She says that she has been congested with a runny nose for the last 2 days. Her grandchildren and her son have both been sick with pinkeye as well as URI. Her son was seen yesterday with copious discharge from the eye and was treated with antibiotic drops. She developed redness in her left eye since last night. This morning she woke up with some mild drainage. This eye aches but is not sensitive to light and vision has not changed. She has significant past medical history of glaucoma and takes drops for this. She states that her eye is itchy as well.   Review of Systems See above    Objective:   Physical Exam Vital signs reviewed General appearance - alert, well appearing, and in no distress and oriented to person, place, and time Heart - normal rate, regular rhythm, normal S1, S2, no murmurs, rubs, clicks or gallops Chest - clear to auscultation, no wheezes, rales or rhonchi, symmetric air entry, no tachypnea, retractions or cyanosis Eyes - pupils equal and reactive, extraocular eye movements intact, conjunctiva injected on left eye. Right eye is normal appearing. There is no drainage seen. Ears - bilateral TM's and external ear canals normal, right ear normal, left ear normal Nose - normal and patent, no erythema, discharge or polyps Vision was tested and was symmetric. Patient does not wear contacts      Assessment & Plan:

## 2011-06-20 NOTE — Patient Instructions (Signed)
It was nice to see you today. I think that your eye looks very different from your son's in that your son's had a lot of drainage I will send in an eyedrop for you to use if your eye does develop a lot of drainage If infection spreads to the other eye tomorrow it is likely to just be a virus You are allergic to the kind of drops that I gave your son so please do not use those

## 2011-06-20 NOTE — Assessment & Plan Note (Signed)
Likely viral because without significant drainage. Not painful to her, not painful with light, no change in vision. Will prescribe Tobrex drops for her in case this changes to more purulent drainage.

## 2011-06-25 ENCOUNTER — Ambulatory Visit (INDEPENDENT_AMBULATORY_CARE_PROVIDER_SITE_OTHER): Payer: Medicare Other | Admitting: Family Medicine

## 2011-06-25 ENCOUNTER — Encounter: Payer: Self-pay | Admitting: Family Medicine

## 2011-06-25 VITALS — BP 128/86 | HR 72 | Temp 98.3°F | Ht 65.0 in | Wt 161.6 lb

## 2011-06-25 DIAGNOSIS — H109 Unspecified conjunctivitis: Secondary | ICD-10-CM | POA: Diagnosis not present

## 2011-06-25 NOTE — Patient Instructions (Signed)
Use allergy eye drop- available over the counter called Alaway or Zaditor  Follow-up with eye doctor

## 2011-06-25 NOTE — Assessment & Plan Note (Addendum)
Never got rx for antibiotics.  Appears more to be allergic rather than bacterial.  Advised to use Always/ Zaditor and follow-up with ophto if no improvement in 2-3 days given severity of redness and multiple medical comorbidities for full eye exam.  No indication for emergent referral today,.

## 2011-06-25 NOTE — Progress Notes (Signed)
  Subjective:    Patient ID: Laura Griffin, female    DOB: 1945-06-17, 66 y.o.   MRN: [id number]  HPI  1 week of eye redness  Both eyes red, itchy, gritty feeling.  Left eye is "sore"  Feels vision is worse- bilaterally Sees Dr. Harriette Bouillon- optho.  Normal check 1-2 months ago- under care for glaucoma and s/p bilateral cataract surgery.  No photophobia, pain with EOM.  Notes some crusting in AM.  Grandchildren have had conjunctivitis  Has had cough for 2 weeks.   Some rhinorrhea, both eyes itch- has been rubbing them.  Review of Systems Notes headache, no fever.     Objective:   Physical Exam GEN: NAD EYE:  Left 20/25, right 20/25 with glasses. EOMI, PERRLA, no photophobia Fluorescein staining no corneal abrasion or ulcer.  Lids flipped with no foreign body or signs of vernal conjunctivitis.  No obvious hypopion or hyphema.  Fundus difficult to appreciate, no obvious hemorrhage or papilledema.  Conjunctiva very injected, generalized distribution. No ciliary flush       Assessment & Plan:

## 2011-06-27 DIAGNOSIS — Z1231 Encounter for screening mammogram for malignant neoplasm of breast: Secondary | ICD-10-CM | POA: Diagnosis not present

## 2011-06-28 ENCOUNTER — Encounter: Payer: Self-pay | Admitting: Family Medicine

## 2011-06-30 ENCOUNTER — Other Ambulatory Visit: Payer: Self-pay | Admitting: Family Medicine

## 2011-06-30 DIAGNOSIS — I10 Essential (primary) hypertension: Secondary | ICD-10-CM

## 2011-07-09 ENCOUNTER — Other Ambulatory Visit: Payer: Self-pay | Admitting: Family Medicine

## 2011-07-09 DIAGNOSIS — I10 Essential (primary) hypertension: Secondary | ICD-10-CM

## 2011-08-06 ENCOUNTER — Encounter: Payer: Self-pay | Admitting: Family Medicine

## 2011-08-07 ENCOUNTER — Ambulatory Visit (INDEPENDENT_AMBULATORY_CARE_PROVIDER_SITE_OTHER): Payer: Medicare Other | Admitting: Family Medicine

## 2011-08-07 ENCOUNTER — Encounter: Payer: Self-pay | Admitting: Family Medicine

## 2011-08-07 VITALS — BP 158/94 | HR 60 | Ht 65.0 in | Wt 163.0 lb

## 2011-08-07 DIAGNOSIS — E663 Overweight: Secondary | ICD-10-CM

## 2011-08-07 DIAGNOSIS — M76899 Other specified enthesopathies of unspecified lower limb, excluding foot: Secondary | ICD-10-CM | POA: Diagnosis not present

## 2011-08-07 DIAGNOSIS — M5430 Sciatica, unspecified side: Secondary | ICD-10-CM

## 2011-08-07 DIAGNOSIS — H9190 Unspecified hearing loss, unspecified ear: Secondary | ICD-10-CM

## 2011-08-07 DIAGNOSIS — M7061 Trochanteric bursitis, right hip: Secondary | ICD-10-CM

## 2011-08-07 DIAGNOSIS — M5412 Radiculopathy, cervical region: Secondary | ICD-10-CM | POA: Diagnosis not present

## 2011-08-07 DIAGNOSIS — I10 Essential (primary) hypertension: Secondary | ICD-10-CM

## 2011-08-07 DIAGNOSIS — F329 Major depressive disorder, single episode, unspecified: Secondary | ICD-10-CM

## 2011-08-07 DIAGNOSIS — Z6825 Body mass index (BMI) 25.0-25.9, adult: Secondary | ICD-10-CM | POA: Diagnosis not present

## 2011-08-07 HISTORY — DX: Unspecified hearing loss, unspecified ear: H91.90

## 2011-08-07 MED ORDER — LORAZEPAM 1 MG PO TABS: 1.0000 mg | ORAL_TABLET | Freq: Four times a day (QID) | ORAL | Status: AC | PRN

## 2011-08-07 NOTE — Assessment & Plan Note (Signed)
improved

## 2011-08-07 NOTE — Assessment & Plan Note (Signed)
R side with weakness so will MRI lumbar spine. She may have spinal stenosis. Lorazepam for claustrophobia

## 2011-08-07 NOTE — Progress Notes (Signed)
Made appt for pt to have open MRI done at [facility] for 5.12.2013 @ 115 pm. Pt to go to the [address] location. Pt stated that this time will not work for her, I gave pt the scheduling card with address and phone number and told her to reschedule this at her convenience and to call our office back with the new time and date. Pt understood and agreed .Loralee Pacas [place]

## 2011-08-07 NOTE — Assessment & Plan Note (Signed)
Well controlled. Couldn't document OH, but may need to decrease beta blocker if orthostatic changes persist

## 2011-08-07 NOTE — Assessment & Plan Note (Signed)
unchanged

## 2011-08-07 NOTE — Assessment & Plan Note (Signed)
Improved and off SSRI

## 2011-08-07 NOTE — Patient Instructions (Signed)
I will call with the back MRI results.   Please return to see Dr Sheffield Slider in 1 month

## 2011-08-07 NOTE — Progress Notes (Signed)
  Subjective:    Patient ID: Laura Griffin, female    DOB: 27-Feb-1946, 66 y.o.   MRN: [id number]  HPIDysequilibrium is increasing. She fell Saturday after catching a toe on the edge of carpet.  Fell total of 3 x due to obstacles over the past year. She feels lightheaded with standing, occasionally like the room is spending  R ear whoosing sounds, decreased hearing, R > L. No pressure sensation.   R leg discomfort at night. Takes acetaminophen at times and sometimes tramadol for this. Her right knee also hurts. It feels like the right leg is going to give out when she is walking and she gets a pain that goes down into her right great toe  Her right arm and shoulder continued to hurt but no numbness or dropping things from the hands anymore. She will get cramps in both hands at times and intermittent low back pain.  She stopped taking the citalopram without a recurrence of depression symptoms  She forgot to take her blood pressure medicines this morning. Her blood pressures recordings at home have been 114-145/73-84.  Review of Systems     Objective:   Physical Exam  Constitutional: She is oriented to person, place, and time. She appears well-developed and well-nourished.  HENT:  Right Ear: External ear normal.  Left Ear: External ear normal.  Eyes: No scleral icterus.  Cardiovascular: Normal rate and regular rhythm.   No murmur heard. Pulmonary/Chest: Effort normal and breath sounds normal.  Musculoskeletal:       Normal range of motion of right hip and knee SLR causes lower leg discomfort on right at 45 degrees  Neurological: She is alert and oriented to person, place, and time. She displays normal reflexes. No cranial nerve deficit. She exhibits normal muscle tone. Coordination normal.       Uses arms to stand No vertigo on neck rotation right leg weak on first standing Sharpened Romberg unsteady Gait narrow and only slightly antalgic on right. Not ataxic   Psychiatric:  She has a normal mood and affect. Her behavior is normal.          Assessment & Plan:

## 2011-08-07 NOTE — Assessment & Plan Note (Signed)
Probable presbycusis, consider meneire's Check audiometry first

## 2011-08-12 ENCOUNTER — Other Ambulatory Visit: Payer: Medicare Other

## 2011-08-14 DIAGNOSIS — H10439 Chronic follicular conjunctivitis, unspecified eye: Secondary | ICD-10-CM | POA: Diagnosis not present

## 2011-08-17 ENCOUNTER — Ambulatory Visit
Admission: RE | Admit: 2011-08-17 | Discharge: 2011-08-17 | Disposition: A | Discharge status: Home / Self Care | Payer: Medicare Other | Source: Ambulatory Visit | Attending: Family Medicine | Admitting: Family Medicine

## 2011-08-17 DIAGNOSIS — M47817 Spondylosis without myelopathy or radiculopathy, lumbosacral region: Secondary | ICD-10-CM | POA: Diagnosis not present

## 2011-08-17 DIAGNOSIS — M5137 Other intervertebral disc degeneration, lumbosacral region: Secondary | ICD-10-CM | POA: Diagnosis not present

## 2011-08-17 DIAGNOSIS — M5430 Sciatica, unspecified side: Secondary | ICD-10-CM

## 2011-08-17 DIAGNOSIS — M5126 Other intervertebral disc displacement, lumbar region: Secondary | ICD-10-CM | POA: Diagnosis not present

## 2011-08-20 ENCOUNTER — Encounter: Payer: Self-pay | Admitting: Family Medicine

## 2011-09-11 ENCOUNTER — Encounter: Payer: Self-pay | Admitting: Family Medicine

## 2011-09-11 ENCOUNTER — Ambulatory Visit (INDEPENDENT_AMBULATORY_CARE_PROVIDER_SITE_OTHER): Payer: Medicare Other | Admitting: Family Medicine

## 2011-09-11 VITALS — BP 135/79 | HR 60 | Ht 65.0 in | Wt 166.2 lb

## 2011-09-11 DIAGNOSIS — Z6825 Body mass index (BMI) 25.0-25.9, adult: Secondary | ICD-10-CM

## 2011-09-11 DIAGNOSIS — F329 Major depressive disorder, single episode, unspecified: Secondary | ICD-10-CM | POA: Diagnosis not present

## 2011-09-11 DIAGNOSIS — I10 Essential (primary) hypertension: Secondary | ICD-10-CM

## 2011-09-11 DIAGNOSIS — M5430 Sciatica, unspecified side: Secondary | ICD-10-CM | POA: Diagnosis not present

## 2011-09-11 DIAGNOSIS — M545 Low back pain, unspecified: Secondary | ICD-10-CM | POA: Diagnosis not present

## 2011-09-11 DIAGNOSIS — E663 Overweight: Secondary | ICD-10-CM

## 2011-09-11 MED ORDER — ZOSTER VACCINE LIVE 19400 UNT/0.65ML ~~LOC~~ SOLR: 0.6500 mL | Freq: Once | SUBCUTANEOUS | Status: DC

## 2011-09-11 NOTE — Assessment & Plan Note (Signed)
Continues to do well off anti-depressant.

## 2011-09-11 NOTE — Progress Notes (Signed)
  Subjective:    Patient ID: Laura Griffin, female    DOB: 09-10-1945, 66 y.o.   MRN: [id number]  HPI blood pressure 117-138/66-85 at home.   Back and leg pain - are slowly improving. No more falls. Sleeping with a bar of soap seems to help. Now can put right foot on the floor without too much pain.   Obesity - less active since not keeping her grand children. Has been eating full calorie ice cream most evenings.  Review of Systems     Objective:   Physical Exam  Constitutional: She is oriented to person, place, and time.       Central obesity  Cardiovascular: Normal rate and regular rhythm.   Murmur heard.      Grade 1/6 SEM  Pulmonary/Chest: Effort normal and breath sounds normal.  Musculoskeletal: She exhibits no edema.  Neurological: She is alert and oriented to person, place, and time.  Psychiatric: She has a normal mood and affect. Her behavior is normal. Judgment and thought content normal.          Assessment & Plan:

## 2011-09-11 NOTE — Assessment & Plan Note (Signed)
No improvement. Recommended diet and exercise

## 2011-09-11 NOTE — Assessment & Plan Note (Signed)
well controlled  

## 2011-09-11 NOTE — Patient Instructions (Addendum)
You will walk 3 days weekly for 25 minutes and gradually build up to 5 days weekly for 45 minutes. Do crunches 3 times weekly  You plan to loose 2 pounds monthly over the next 3 months.   Replace the ice cream with a lower calorie alternative.  Please return to see Dr Sheffield Slider in 3months.

## 2011-09-11 NOTE — Assessment & Plan Note (Signed)
Improved symptoms, degenerative joint disease and degenerative disc disease changes that could cause some radiculopath.

## 2011-09-14 DIAGNOSIS — Z124 Encounter for screening for malignant neoplasm of cervix: Secondary | ICD-10-CM | POA: Diagnosis not present

## 2011-09-14 DIAGNOSIS — Z01419 Encounter for gynecological examination (general) (routine) without abnormal findings: Secondary | ICD-10-CM | POA: Diagnosis not present

## 2011-10-25 ENCOUNTER — Encounter: Payer: Self-pay | Admitting: Family Medicine

## 2011-10-25 ENCOUNTER — Ambulatory Visit (INDEPENDENT_AMBULATORY_CARE_PROVIDER_SITE_OTHER): Payer: Medicare Other | Admitting: Family Medicine

## 2011-10-25 VITALS — BP 124/76 | HR 80 | Temp 98.4°F | Ht 65.5 in | Wt 165.0 lb

## 2011-10-25 DIAGNOSIS — M5412 Radiculopathy, cervical region: Secondary | ICD-10-CM | POA: Diagnosis not present

## 2011-10-25 DIAGNOSIS — M1990 Unspecified osteoarthritis, unspecified site: Secondary | ICD-10-CM

## 2011-10-25 DIAGNOSIS — M545 Low back pain: Secondary | ICD-10-CM | POA: Diagnosis not present

## 2011-10-25 DIAGNOSIS — I10 Essential (primary) hypertension: Secondary | ICD-10-CM

## 2011-10-25 LAB — BASIC METABOLIC PANEL
BUN: 13 mg/dL (ref 6–23)
CO2: 28 mEq/L (ref 19–32)
Chloride: 107 mEq/L (ref 96–112)
Glucose, Bld: 86 mg/dL (ref 70–99)
Potassium: 4.1 mEq/L (ref 3.5–5.3)

## 2011-10-25 LAB — CBC WITH DIFFERENTIAL/PLATELET
Basophils Absolute: 0 10*3/uL (ref 0.0–0.1)
Eosinophils Absolute: 0.3 10*3/uL (ref 0.0–0.7)
Eosinophils Relative: 6 % — ABNORMAL HIGH (ref 0–5)
MCH: 29 pg (ref 26.0–34.0)
MCV: 87.8 fL (ref 78.0–100.0)
Platelets: 302 10*3/uL (ref 150–400)
RDW: 14.4 % (ref 11.5–15.5)

## 2011-10-25 MED ORDER — GABAPENTIN 300 MG PO CAPS: 300.0000 mg | ORAL_CAPSULE | Freq: Three times a day (TID) | ORAL | Status: DC

## 2011-10-25 NOTE — Assessment & Plan Note (Signed)
well controlled  

## 2011-10-25 NOTE — Assessment & Plan Note (Addendum)
The radiation of pain into the right pelvic brim area appears to be radiculopathy pain and spasm related to lower thoracic DDD and DJD.  We will treat with a short course of Ibuprofen and Gabapentin with prn Acetaminophen and Tramadol.

## 2011-10-25 NOTE — Assessment & Plan Note (Addendum)
R arm and knee pain likely worse from DJD worsened by abnormal posturing related to her cervical and lumbar spine problems. Normal ESR weighs against inflammatory causes.

## 2011-10-25 NOTE — Assessment & Plan Note (Signed)
Part of the cause of her right arm pain, but no weakness or loss of reflex

## 2011-10-25 NOTE — Progress Notes (Signed)
Patient ID: Laura Griffin, female   DOB: 1945/10/17, 66 y.o.   MRN: [id number]   HPI:  ABD/BACK PAIN: 66 yo female presents with right lower quadrant abdominal pain that radiates around to the right side of her back for the last week off and on. It was also present 1-2 days last week. The pain in her lower back feels like a pulled muscle. The pain in her back is worse with lying still. It is most alleviated by walking and standing. She sleeps in the fetal position at night. Does not recall injuring herself or pulling a muscle. Denies urinary frequency or dysuria. No changes in bowel/bladder, no fevers. Has not taken any medicine for pain. Also has pain in R knee.  ARM PAIN: Has experienced pain in right arm on the lateral aspect superior to the elbow. The pain is stabbing. No finger numbness or neck pain. She does feel like she has some weakness in her hand with picking up items.   Physical Exam: Gen: NAD Heart: pulse 50 Resp: NWOB Abd: mildly TTP of RLQ, most painful over R pelvic brim, no pain with valsalva MSK: pain with active external rotation of R shoulder. Pain with pronation of R hand. No pain with flexion or extension of R wrist or with supination of R hand. Full passive range of motion of R shoulder. TTP of R AC joint. Negative Spurling's. No spasm in neck. TTP in R upper trapezius area. Full strength elbow extension & flexion. Full strength of fingers bilaterally. Full strength of wrist extension. Normal arm reflexes bilaterally. Spine is nontender to palpation until T11-12. Spine straight when standing (no scoliosis).  Negative straight leg raise. Right hip flexion limited by pain. Full strength left hip flexion. Narrow based gait. Unable to stand from seated position without using arms. TTP of medial joint line in R knee. No effusion in R knee.  Assessment & Plan: Pain is likely musculoskeletal in nature. No red flags. Pain does not appear to be from her abdomen.  -Gabapentin  300mg  capsule up to TID -Ibuprofen x 5 days  600mg  TID with food. -Can add tramadol and acetaminophen -Physical therapy -will hold off on muscle spasm agents

## 2011-10-25 NOTE — Patient Instructions (Addendum)
Take ibuprofen 600 mg three times daily with food for five days.  We are giving you a new medication called Gabapentin 300mg . You can take it up to three times a day but it may make you sleepy, so you should start off just taking it at night.  7am: Take two pills of Tylenol 500mg  Breakfast: Ibuprofen 600mg  Lunch: Ibuprofen 600mg  Dinner: Ibuprofen 600mg  Bedtime: Gabapentin  If you get any fevers, changes in your bowel or bladder habits, or any new onset weakness, please come back to the clinic or go to the emergency room.   Schedule follow up appointment for one week.

## 2011-10-30 ENCOUNTER — Encounter: Payer: Self-pay | Admitting: Home Health Services

## 2011-10-31 ENCOUNTER — Encounter: Payer: Self-pay | Admitting: Family Medicine

## 2011-11-01 ENCOUNTER — Encounter: Payer: Self-pay | Admitting: Family Medicine

## 2011-11-01 ENCOUNTER — Ambulatory Visit (INDEPENDENT_AMBULATORY_CARE_PROVIDER_SITE_OTHER): Payer: Medicare Other | Admitting: Family Medicine

## 2011-11-01 VITALS — BP 102/66 | HR 50 | Ht 65.5 in | Wt 168.0 lb

## 2011-11-01 DIAGNOSIS — M7551 Bursitis of right shoulder: Secondary | ICD-10-CM

## 2011-11-01 DIAGNOSIS — M719 Bursopathy, unspecified: Secondary | ICD-10-CM

## 2011-11-01 DIAGNOSIS — I10 Essential (primary) hypertension: Secondary | ICD-10-CM | POA: Diagnosis not present

## 2011-11-01 DIAGNOSIS — M545 Low back pain, unspecified: Secondary | ICD-10-CM | POA: Diagnosis not present

## 2011-11-01 DIAGNOSIS — M5412 Radiculopathy, cervical region: Secondary | ICD-10-CM | POA: Diagnosis not present

## 2011-11-01 DIAGNOSIS — M67919 Unspecified disorder of synovium and tendon, unspecified shoulder: Secondary | ICD-10-CM | POA: Diagnosis not present

## 2011-11-01 NOTE — Patient Instructions (Addendum)
Take Ibuprofen as needed for shoulder pain for a couple days, thereafter use Acetaminophen  Do the shoulder and hip stretching exercises and crunches as soon as tolerated and try to gradually increase your walking distance. Avoid prolonged car trips until your back is better  Please return to see Dr Sheffield Slider in 2 weeks.

## 2011-11-01 NOTE — Progress Notes (Signed)
  Subjective:    Patient ID: Laura Griffin, female    DOB: [birth year], 66 y.o.   MRN: [id number]  HPI Pain in right pelvic area and leg has nearly resolved after the course of Ibuprofen, but she is now having pain in left low back into the upper left leg. It is better standing and walking.   R shoulder pain persists, particularly when she raises it to the side. She can't get it above 90 degrees or reach her hand behind her head. Currently no paresthesias or weakness in the right lower arm. The pain is primarily in the lateral mid-humeral area. No problems with bowel or bladder control.   Review of Systems     Objective:   Physical Exam  Constitutional: She is oriented to person, place, and time. She appears well-developed and well-nourished.  HENT:  Head: Normocephalic and atraumatic.  Neck: No JVD present.  Cardiovascular: Normal rate and regular rhythm.   Pulses:      Radial pulses are 2+ on the right side.  Pulmonary/Chest: Effort normal and breath sounds normal. No respiratory distress.  Abdominal: Soft. Normal appearance and bowel sounds are normal. There is no tenderness.  Musculoskeletal:       Right shoulder: She exhibits decreased range of motion, tenderness, bony tenderness and crepitus.       Lumbar back: She exhibits tenderness.       R Shoulder  + Hawkins test, + Neer's impingement sign, - Empty Can, - Crossarm maneuver, MS 5/5  TTP L Iliolumbar ligament at PSIS and also @ insertion of L piriformis.  L hip ROM normal along with FABER eliciting to pain, burning, numbness, tingling  Neurological: She is alert and oriented to person, place, and time. She has normal strength.  Reflex Scores:      Bicep reflexes are 2+ on the right side and 2+ on the left side.      Patellar reflexes are 2+ on the right side and 2+ on the left side.      Achilles reflexes are 2+ on the right side and 2+ on the left side. Skin: Skin is warm, dry and intact.          Assessment &  Plan:

## 2011-11-02 NOTE — Assessment & Plan Note (Signed)
Low normal today without OH symptoms

## 2011-11-02 NOTE — Assessment & Plan Note (Signed)
At risk of frozen shoulder. I assisted Dr Paulina Fusi in injecting the right shoulder joint from a posterior approach with 3 cc of Aristospan 10 mg/cc and 6 cc of Lidocaine. She was taught shoulder mobility exercises. Consider PT consult on follow up visit.

## 2011-11-02 NOTE — Assessment & Plan Note (Signed)
symptoms improved

## 2011-11-02 NOTE — Assessment & Plan Note (Signed)
Now symptoms worse on left side. May be element of piriformis spasm that could improve with stretching, so she was taught these exercises.

## 2011-11-27 ENCOUNTER — Ambulatory Visit (INDEPENDENT_AMBULATORY_CARE_PROVIDER_SITE_OTHER): Payer: Medicare Other | Admitting: Family Medicine

## 2011-11-27 ENCOUNTER — Encounter: Payer: Self-pay | Admitting: Family Medicine

## 2011-11-27 VITALS — BP 118/78 | HR 60 | Ht 65.5 in | Wt 172.0 lb

## 2011-11-27 DIAGNOSIS — I10 Essential (primary) hypertension: Secondary | ICD-10-CM | POA: Diagnosis not present

## 2011-11-27 DIAGNOSIS — M719 Bursopathy, unspecified: Secondary | ICD-10-CM | POA: Diagnosis not present

## 2011-11-27 DIAGNOSIS — M67919 Unspecified disorder of synovium and tendon, unspecified shoulder: Secondary | ICD-10-CM | POA: Diagnosis not present

## 2011-11-27 DIAGNOSIS — R51 Headache: Secondary | ICD-10-CM

## 2011-11-27 DIAGNOSIS — H409 Unspecified glaucoma: Secondary | ICD-10-CM

## 2011-11-27 DIAGNOSIS — M7551 Bursitis of right shoulder: Secondary | ICD-10-CM

## 2011-11-27 NOTE — Assessment & Plan Note (Signed)
Almost completely resolved.

## 2011-11-27 NOTE — Assessment & Plan Note (Signed)
Using her eye drops. Had an opthalmology visit last May

## 2011-11-27 NOTE — Assessment & Plan Note (Signed)
well controlled  

## 2011-11-27 NOTE — Patient Instructions (Addendum)
.  Please return to see Dr Sheffield Slider in 6 months.  Call Dr Sheffield Slider if the facial pain becomes more severe

## 2011-11-27 NOTE — Assessment & Plan Note (Signed)
Sounds neuralgic, hopefully will self resolve No findings for sinusitis.

## 2011-11-27 NOTE — Progress Notes (Signed)
  Subjective:    Patient ID: Laura Griffin, female    DOB: 1945/07/06, 66 y.o.   MRN: [id number]  HPI Eye pain - on right for 5 days, sharp, intermittent, lasting 5 minutes. No trigger point, sinus symptoms, vision changes, rash  right shoulder - pain resolved several days after the injection. Full range of motion   Low back and leg pain - better  Review of Systems     Objective:   Physical Exam  Eyes: Conjunctivae and EOM are normal. Pupils are equal, round, and reactive to light. Right eye exhibits no discharge. Left eye exhibits no discharge. No scleral icterus.  Cardiovascular: Normal rate and regular rhythm.   Pulmonary/Chest: Effort normal and breath sounds normal.  Musculoskeletal:       Full range of motion of right shoulder without pain  Neurological: She is alert.  Psychiatric: She has a normal mood and affect. Her behavior is normal.          Assessment & Plan:

## 2012-01-16 ENCOUNTER — Ambulatory Visit (INDEPENDENT_AMBULATORY_CARE_PROVIDER_SITE_OTHER): Payer: Medicare Other | Admitting: *Deleted

## 2012-01-16 DIAGNOSIS — Z23 Encounter for immunization: Secondary | ICD-10-CM

## 2012-02-19 DIAGNOSIS — H40029 Open angle with borderline findings, high risk, unspecified eye: Secondary | ICD-10-CM | POA: Diagnosis not present

## 2012-02-21 ENCOUNTER — Other Ambulatory Visit: Payer: Self-pay | Admitting: Family Medicine

## 2012-03-13 DIAGNOSIS — H1030 Unspecified acute conjunctivitis, unspecified eye: Secondary | ICD-10-CM | POA: Diagnosis not present

## 2012-03-19 DIAGNOSIS — J209 Acute bronchitis, unspecified: Secondary | ICD-10-CM | POA: Diagnosis not present

## 2012-03-20 ENCOUNTER — Ambulatory Visit: Payer: Medicare Other | Admitting: Family Medicine

## 2012-05-20 ENCOUNTER — Encounter: Payer: Self-pay | Admitting: Family Medicine

## 2012-05-20 ENCOUNTER — Ambulatory Visit (INDEPENDENT_AMBULATORY_CARE_PROVIDER_SITE_OTHER): Payer: Medicare Other | Admitting: Family Medicine

## 2012-05-20 VITALS — BP 139/86 | HR 56 | Ht 64.5 in | Wt 174.0 lb

## 2012-05-20 DIAGNOSIS — M545 Low back pain, unspecified: Secondary | ICD-10-CM | POA: Diagnosis not present

## 2012-05-20 DIAGNOSIS — I10 Essential (primary) hypertension: Secondary | ICD-10-CM | POA: Diagnosis not present

## 2012-05-20 DIAGNOSIS — M5412 Radiculopathy, cervical region: Secondary | ICD-10-CM | POA: Diagnosis not present

## 2012-05-20 LAB — BASIC METABOLIC PANEL
BUN: 14 mg/dL (ref 6–23)
CO2: 28 mEq/L (ref 19–32)
Calcium: 10.8 mg/dL — ABNORMAL HIGH (ref 8.4–10.5)
Creat: 0.79 mg/dL (ref 0.50–1.10)

## 2012-05-20 MED ORDER — AMLODIPINE BESYLATE 10 MG PO TABS: 10.0000 mg | ORAL_TABLET | Freq: Every day | ORAL | Status: DC

## 2012-05-20 MED ORDER — ZOSTER VACCINE LIVE 19400 UNT/0.65ML ~~LOC~~ SOLR: 0.6500 mL | Freq: Once | SUBCUTANEOUS | Status: DC

## 2012-05-20 MED ORDER — GABAPENTIN 600 MG PO TABS: 600.0000 mg | ORAL_TABLET | Freq: Three times a day (TID) | ORAL | Status: DC

## 2012-05-20 NOTE — Progress Notes (Signed)
  Subjective:    Patient ID: Laura Griffin, female    DOB: [birth year], 67 y.o.   MRN: [id number]  Hypertension   Neck and right arm - chronic right neck pain. Numbness right lateral 2 fingers and sometimes will drop a pencil. Pain right shoulder to elbow. No bowel or bladder changes.   right leg piercing pain when in bed at night 3-4 times weekly, knee to ankle to lateral foot. Relieved by getting up. Can walk 2 miles without calf pain. Only taking Gabapentin 300 mg capsule at bedtime.  Review of Systems     Objective:   Physical Exam  Constitutional:  Moderately obese  Cardiovascular: Normal rate and regular rhythm.   Pulmonary/Chest: Effort normal and breath sounds normal.  Musculoskeletal: Normal range of motion. She exhibits no edema.  Negative SLR bilaterally. Normal ROM of hip and low back.   No symptoms in arm with looking to the right and up. Normal armand hand strength.   Neurological: She is alert.  Psychiatric: She has a normal mood and affect. Her behavior is normal.          Assessment & Plan:

## 2012-05-20 NOTE — Assessment & Plan Note (Signed)
Current symptoms suggest C8 radiculopathy. No myelopathy symptoms to require MRI. Try increased Gabapentin and watch for red flags.  Recommended a support pillow and sleeping on her back.

## 2012-05-20 NOTE — Assessment & Plan Note (Addendum)
well controlled BMET

## 2012-05-20 NOTE — Patient Instructions (Signed)
We'll hold off on MRI of your neck unless you develop weakness in the arm or leg or change in bowel or  Bladder function. Avoid looking up and to the side.  Increase your Gabapentin to 2 of the 300 mg capsules nightly then get the 600 mg tablets.   Continue your other medicine the same.

## 2012-05-20 NOTE — Assessment & Plan Note (Signed)
Likely radiculopathy cause of pain. Will increase Gabapentin.

## 2012-05-21 ENCOUNTER — Encounter: Payer: Self-pay | Admitting: Family Medicine

## 2012-07-03 DIAGNOSIS — Z1231 Encounter for screening mammogram for malignant neoplasm of breast: Secondary | ICD-10-CM | POA: Diagnosis not present

## 2012-07-11 DIAGNOSIS — H4011X0 Primary open-angle glaucoma, stage unspecified: Secondary | ICD-10-CM | POA: Diagnosis not present

## 2012-07-17 DIAGNOSIS — H4011X0 Primary open-angle glaucoma, stage unspecified: Secondary | ICD-10-CM | POA: Diagnosis not present

## 2012-07-23 ENCOUNTER — Other Ambulatory Visit: Payer: Self-pay | Admitting: Family Medicine

## 2012-08-05 ENCOUNTER — Encounter: Payer: Self-pay | Admitting: Family Medicine

## 2012-08-05 ENCOUNTER — Ambulatory Visit (INDEPENDENT_AMBULATORY_CARE_PROVIDER_SITE_OTHER): Payer: Medicare Other | Admitting: Family Medicine

## 2012-08-05 VITALS — BP 145/82 | HR 60 | Ht 64.5 in | Wt 176.0 lb

## 2012-08-05 DIAGNOSIS — I10 Essential (primary) hypertension: Secondary | ICD-10-CM | POA: Diagnosis not present

## 2012-08-05 DIAGNOSIS — M7551 Bursitis of right shoulder: Secondary | ICD-10-CM

## 2012-08-05 DIAGNOSIS — H9190 Unspecified hearing loss, unspecified ear: Secondary | ICD-10-CM

## 2012-08-05 DIAGNOSIS — M5412 Radiculopathy, cervical region: Secondary | ICD-10-CM | POA: Diagnosis not present

## 2012-08-05 DIAGNOSIS — M1990 Unspecified osteoarthritis, unspecified site: Secondary | ICD-10-CM | POA: Diagnosis not present

## 2012-08-05 DIAGNOSIS — Z6825 Body mass index (BMI) 25.0-25.9, adult: Secondary | ICD-10-CM

## 2012-08-05 DIAGNOSIS — E663 Overweight: Secondary | ICD-10-CM

## 2012-08-05 DIAGNOSIS — M67919 Unspecified disorder of synovium and tendon, unspecified shoulder: Secondary | ICD-10-CM | POA: Diagnosis not present

## 2012-08-05 DIAGNOSIS — H9193 Unspecified hearing loss, bilateral: Secondary | ICD-10-CM

## 2012-08-05 MED ORDER — TRAMADOL HCL 50 MG PO TABS: 50.0000 mg | ORAL_TABLET | Freq: Three times a day (TID) | ORAL | Status: DC | PRN

## 2012-08-05 MED ORDER — IBUPROFEN 600 MG PO TABS: 600.0000 mg | ORAL_TABLET | Freq: Three times a day (TID) | ORAL | Status: DC | PRN

## 2012-08-05 NOTE — Patient Instructions (Addendum)
Please return to see Dr Sheffield Slider in 1 month  Schedule fasting labs before that visit.  Take Tramadol 50 mg for more severe pain.   Take an extra Gabapentin at suppertime if needed.   Take the Ibuprofen 600 mg three times daily with food and try to do your range of motion exercises for your right shoulder. Return for shoulder injection if it doesn't improve

## 2012-08-05 NOTE — Assessment & Plan Note (Signed)
symptoms improved on Gabapentin. I emphasized the need to control this and degenerative joint disease pain to allow her to exercise, which will help her symptoms.

## 2012-08-05 NOTE — Assessment & Plan Note (Signed)
Continues to increase. Approaching obesity range.

## 2012-08-05 NOTE — Assessment & Plan Note (Signed)
Worsening and at risk of frozen shoulder. She declined a steroid injection today, but will try a short course of Ibuprofen. She will consider PT later if not improving

## 2012-08-05 NOTE — Assessment & Plan Note (Signed)
Probable cause of her knee almost giving out. I encouraged the use of acetaminophen.

## 2012-08-05 NOTE — Progress Notes (Signed)
  Subjective:    Patient ID: Laura Griffin, female    DOB: 1945-12-01, 67 y.o.   MRN: [id number]  HPI Radiculopathy - the numbness and decreased dexterity in her right hand have improved, but she still gets 6/10 pain in the right neck and shoulder. She is sleeping better, only awakening a couple times nightly on the Gabapentin. No bowel or bladder symptoms. She has difficulty getting her right hand behind her head and the right shoulder pain had improved 6 months ago after a steroid injection.   Balance - She's almost fallen twice. Once there was pain in the right knee which almost gave out. Another time there was an obstacle in her path, but before she would have easily avoided it.   Hypertension - Her blood pressure was 94/60 at the eye doctor by her recollection.   Hearing - she'd like an audiology referral. Is turning the TV up too loud.  Review of Systems     Objective:   Physical Exam  HENT:  Right Ear: External ear normal.  Left Ear: External ear normal.  Cardiovascular: Normal rate and regular rhythm.   No murmur heard. Pulmonary/Chest: Effort normal and breath sounds normal. She has no rales.  Musculoskeletal:  Negative right shoulder drop test, but pain with ext rotation and positive impingement sign. Pain when resisting downward pressure when arm in thumb down position.   Slumped posture.           Assessment & Plan:

## 2012-08-05 NOTE — Assessment & Plan Note (Signed)
Adequate control. 

## 2012-08-05 NOTE — Assessment & Plan Note (Signed)
Mildly elevated. She denies taking Calcium supplements

## 2012-08-21 ENCOUNTER — Ambulatory Visit: Payer: Medicare Other | Attending: Family Medicine | Admitting: Audiology

## 2012-08-21 DIAGNOSIS — H903 Sensorineural hearing loss, bilateral: Secondary | ICD-10-CM | POA: Insufficient documentation

## 2012-08-21 NOTE — Procedures (Signed)
Patient Name:  Laura Griffin MRN:  [id number] DOB:  07/02/1945  Referring Physician:  Tobin Chad, MD  HISTORY:  [name], a delightful 67 y.o. old was seen for audiological evaluation upon referral of Tobin Chad, MD.   The patient reported a gradual decrease of hearing on the right side.  A hearing screen conducted 1-2 years ago noted some difficulty but reported there was nothing to be concerned with.  There was no report of familial history of hearing loss, ear infections,  tinnitus, facial numbness or dizziness.  she stated no difficulty with understanding conversational speech in quiet or in groups or listening to the TV.  The patient did note however, that she did not hear as well if holding the phone to the right side or in listening to her spouse's voice from another room.  REPORT OF PAIN:  None  EVALUATION:   Aiir and bone conduction audiometry from 500Hz  - 8000Hz  utilizing standard  earphones revealed a mild to slight hearing sensory neural loss on the left side and a moderate to mild sensory neural loss on the right side.   Speech reception thresholds were consistent with the pure tone results indicative of good test reliability.  Speech recognition testing was conducted in each ear independently, at a comfortable listening level and indicated  excellent skills in the left and right ears respectively.  Impedance audiometry was utilized and normal Type A tympanograms were obtained bilaterally indicative of good middle ear function.  Acoustic reflexes were screened at 1000Hz  utilizing ipsilateral stimulation and were absent bilaterally.  CONCLUSION:   [name] appears to have a mild to slight sensory neural loss on the left side and a moderate to mild sensory neural loss on the right side.  Speech recognition abilities are within normal limits at a mildly elevated conversational level.  RECOMMENDATIONS:    1. Audiological re-evaluation in one year.  2. Continue to monitor  hearing at home.  Should any changes be noted, a re-evaluation should be scheduled sooner. 3. As the patient is quite comfortable with her hearing status; a hearing aid evaluation is not recommended at this time.   Allyn Kenner Pugh, Au.D. Doctor of Audiology CCC-A  cc: Tobin Chad, MD

## 2012-08-21 NOTE — Patient Instructions (Signed)
Hearing Loss  A hearing loss is sometimes called deafness. Hearing loss may be partial or total.  CAUSES  Hearing loss may be caused by:   Wax in the ear canal.   Infection of the ear canal.   Infection of the middle ear.   Trauma to the ear or surrounding area.   Fluid in the middle ear.   A hole in the eardrum (perforated eardrum).   Exposure to loud sounds or music.   Problems with the hearing nerve.   Certain medications.  Hearing loss without wax, infection, or a history of injury may mean that the nerve is involved. Hearing loss with severe dizziness, nausea and vomiting or ringing in the ear may suggest a hearing nerve irritation or problems in the middle or inner ear. If hearing loss is untreated, there is a greater likelihood for residual or permanent hearing loss.  DIAGNOSIS  A hearing test (audiometry) assesses hearing loss. The audiometry test needs to be performed by a hearing specialist (audiologist).  TREATMENT  Treatment for recent onset of hearing loss may include:   Ear wax removal.   Medications that kill germs (antibiotics).   Cortisone medications.   Prompt follow up with the appropriate specialist.  Return of hearing depends on the cause of your hearing loss, so proper medical follow-up is important. Some hearing loss may not be reversible, and a caregiver should discuss care and treatment options with you.  SEEK MEDICAL CARE IF:    You have a severe headache, dizziness, or changes in vision.   You have new or increased weakness.   You develop repeated vomiting or other serious medical problems.   You have a fever.  Document Released: 03/19/2005 Document Revised: 06/11/2011 Document Reviewed: 07/14/2009  [employer] Patient Information 2014 [employer].

## 2012-08-29 ENCOUNTER — Other Ambulatory Visit: Payer: Self-pay | Admitting: Family Medicine

## 2012-08-29 ENCOUNTER — Other Ambulatory Visit: Payer: Medicare Other

## 2012-08-29 DIAGNOSIS — M545 Low back pain, unspecified: Secondary | ICD-10-CM | POA: Diagnosis not present

## 2012-08-29 DIAGNOSIS — R5383 Other fatigue: Secondary | ICD-10-CM

## 2012-08-29 DIAGNOSIS — I10 Essential (primary) hypertension: Secondary | ICD-10-CM

## 2012-08-29 DIAGNOSIS — R5381 Other malaise: Secondary | ICD-10-CM

## 2012-08-29 LAB — COMPREHENSIVE METABOLIC PANEL
ALT: 16 U/L (ref 0–35)
AST: 22 U/L (ref 0–37)
Calcium: 10.3 mg/dL (ref 8.4–10.5)
Chloride: 108 mEq/L (ref 96–112)
Creat: 0.98 mg/dL (ref 0.50–1.10)
Sodium: 143 mEq/L (ref 135–145)
Total Bilirubin: 0.4 mg/dL (ref 0.3–1.2)
Total Protein: 7.4 g/dL (ref 6.0–8.3)

## 2012-08-29 LAB — LIPID PANEL
Total CHOL/HDL Ratio: 3.6 Ratio
VLDL: 33 mg/dL (ref 0–40)

## 2012-08-29 LAB — CBC
Hemoglobin: 11.8 g/dL — ABNORMAL LOW (ref 12.0–15.0)
Platelets: 322 10*3/uL (ref 150–400)
RBC: 4.09 MIL/uL (ref 3.87–5.11)
WBC: 5.2 10*3/uL (ref 4.0–10.5)

## 2012-09-01 ENCOUNTER — Encounter: Payer: Self-pay | Admitting: Family Medicine

## 2012-09-09 ENCOUNTER — Ambulatory Visit (INDEPENDENT_AMBULATORY_CARE_PROVIDER_SITE_OTHER): Payer: Medicare Other | Admitting: Family Medicine

## 2012-09-09 ENCOUNTER — Encounter: Payer: Self-pay | Admitting: Family Medicine

## 2012-09-09 VITALS — BP 142/84 | HR 56 | Ht 64.5 in | Wt 175.9 lb

## 2012-09-09 DIAGNOSIS — M67919 Unspecified disorder of synovium and tendon, unspecified shoulder: Secondary | ICD-10-CM

## 2012-09-09 DIAGNOSIS — H9190 Unspecified hearing loss, unspecified ear: Secondary | ICD-10-CM

## 2012-09-09 DIAGNOSIS — I10 Essential (primary) hypertension: Secondary | ICD-10-CM | POA: Diagnosis not present

## 2012-09-09 DIAGNOSIS — M1990 Unspecified osteoarthritis, unspecified site: Secondary | ICD-10-CM

## 2012-09-09 DIAGNOSIS — M5412 Radiculopathy, cervical region: Secondary | ICD-10-CM

## 2012-09-09 DIAGNOSIS — H9193 Unspecified hearing loss, bilateral: Secondary | ICD-10-CM

## 2012-09-09 DIAGNOSIS — M545 Low back pain, unspecified: Secondary | ICD-10-CM | POA: Diagnosis not present

## 2012-09-09 DIAGNOSIS — M7551 Bursitis of right shoulder: Secondary | ICD-10-CM

## 2012-09-09 DIAGNOSIS — M719 Bursopathy, unspecified: Secondary | ICD-10-CM | POA: Diagnosis not present

## 2012-09-09 NOTE — Assessment & Plan Note (Signed)
Fair control.

## 2012-09-09 NOTE — Assessment & Plan Note (Signed)
We discussed adjustments to reduce background noise masking Avoid excessive noise exposure.

## 2012-09-09 NOTE — Assessment & Plan Note (Signed)
Pain tolerable. I encouraged walking and weight reduction starting with goal of 5 lbs.

## 2012-09-09 NOTE — Patient Instructions (Addendum)
I will be retiring in the summer of this year.  Thank you for allowing me to work with you in maintaining your health. Donnella Sham, MD will be my replacement beginning the second week in August. He is moving to [place] having completed his family medicine residency training in [place], [place]. Please let me know if you would like to be assigned to a different physician.  Please return to see him in 3 months.    Presbycusis Age-related hearing loss (presbycusis) affects nearly one third of the elderly. It generally starts around middle age and is more common in men. The changes causing this take place in the cochlea, a cavity in the middle ear. This contains many tiny hairs that convert sound vibrations into electrical impulses, which are interpreted by your brain. As we grow older this type of hearing loss is termed sensoryneural hearing loss. It is permanent and cannot be corrected surgically. People with this type of hearing loss will probably need hearing aids. HOME CARE INSTRUCTIONS   Learning to read lips will be helpful.  Face the person speaking to you to assist with lip reading.  Watch cues such as hand gestures and facial expressions of person talking to you.  Decrease, or if possible, avoid background noise.  Ask your caregiver if hearing aids are appropriate to help with your type of hearing loss.  Local civic and community groups may have means for financial help in obtaining hearing aids if they aren't covered by your health insurance.  If you have hearing aids, do not wear them in the shower or while swimming.  Keep hearing aids dry, clean, and away from chemicals, including hair care products. Document Released: 03/16/2000 Document Revised: 06/11/2011 Document Reviewed: 03/19/2005 [facility] Patient Information 2014 [place], [place].

## 2012-09-09 NOTE — Progress Notes (Signed)
  Subjective:    Patient ID: Laura Griffin, female    DOB: Sep 17, 1945, 67 y.o.   MRN: [id number]  HPI Back pain and right neck and arm pain- this is stable on occasional Acetaminophen and Tramadol. Finished a short course of Ibuprofen. The numbness and pain in the arm is better. Her neck feels tight at times. .   Weight - watching her diet but little exercise. Meets a friend about once weekly to walk. Belongs to [facility], but hasn't been going.  Review of Systems     Objective:   Physical Exam  Cardiovascular: Normal rate and regular rhythm.   No murmur heard. Pulmonary/Chest: Effort normal and breath sounds normal. She has no wheezes. She has no rales.  Musculoskeletal: She exhibits no edema.  Neurological: She is alert.  Psychiatric: She has a normal mood and affect. Her behavior is normal.          Assessment & Plan:

## 2012-09-09 NOTE — Assessment & Plan Note (Signed)
Improved symptoms of pain and numbness in right arm

## 2012-09-09 NOTE — Assessment & Plan Note (Signed)
Less painful

## 2012-09-16 ENCOUNTER — Encounter: Payer: Self-pay | Admitting: Certified Nurse Midwife

## 2012-09-17 ENCOUNTER — Ambulatory Visit (INDEPENDENT_AMBULATORY_CARE_PROVIDER_SITE_OTHER): Payer: Medicare Other | Admitting: Certified Nurse Midwife

## 2012-09-17 ENCOUNTER — Encounter: Payer: Self-pay | Admitting: Certified Nurse Midwife

## 2012-09-17 VITALS — BP 106/60 | HR 64 | Resp 16 | Ht 64.0 in | Wt 175.0 lb

## 2012-09-17 DIAGNOSIS — Z124 Encounter for screening for malignant neoplasm of cervix: Secondary | ICD-10-CM

## 2012-09-17 DIAGNOSIS — Z01419 Encounter for gynecological examination (general) (routine) without abnormal findings: Secondary | ICD-10-CM | POA: Diagnosis not present

## 2012-09-17 DIAGNOSIS — Z0000 Encounter for general adult medical examination without abnormal findings: Secondary | ICD-10-CM | POA: Diagnosis not present

## 2012-09-17 NOTE — Patient Instructions (Addendum)

## 2012-09-17 NOTE — Progress Notes (Signed)
67 y.o. G1P1 Married African American Fe here for annual exam. Menopausal no HRT, denies vaginal bleeding or vaginal dryness. Hypertension stable on medications with PCP management.  Sees PCP every 3-6 months for labs and evaluation.  Denies problems today.     [name] last menstrual period was 04/02/1996.          Sexually active: no  The current method of family planning is tubal ligation.    Exercising: no  exercise Smoker:  no  Health Maintenance: Pap: 08/08/10 neg MMG:  07/03/12 normal Colonoscopy:  6/06 BMD:   2011 TDaP:  2008 Labs: none Self breast exam: done occ   reports that she quit smoking about 25 years ago. Her smoking use included Cigarettes. She has a 7.5 pack-year smoking history. She quit smokeless tobacco use about 16 years ago. She reports that she does not drink alcohol or use illicit drugs.  Past Medical History  Diagnosis Date  . Left sided sciatica 8/95,11/00    CT confirmed   . Bursitis of shoulder, right 09/19/1999  . Arthritis of knee, right 03/11/2006  . Diverticulosis   . Glaucoma   . Osteoarthritis   . Mitral valve prolapse   . Hypertension     Past Surgical History  Procedure Laterality Date  . Tubal ligation  1973  . Vulva /perineum biopsy  2010    for hypopigmentation, non-specific result  . Cataract extraction  02/2011    left eye    Current Outpatient Prescriptions  Medication Sig Dispense Refill  . acetaminophen (TYLENOL) 650 MG CR tablet Take 650 mg by mouth every 8 (eight) hours as needed.      [name] amLODipine (NORVASC) 10 MG tablet Take 1 tablet (10 mg total) by mouth daily.  30 tablet  11  . aspirin (BAYER CHILDRENS ASPIRIN) 81 MG chewable tablet Chew 81 mg by mouth daily.        . brimonidine-timolol (COMBIGAN) 0.2-0.5 % ophthalmic solution Place 2 drops into both eyes every 12 (twelve) hours.        . Cholecalciferol (VITAMIN D3) 1000 UNIT capsule Take 1,000 Units by mouth daily.        [name] gabapentin (NEURONTIN) 600 MG tablet Take 1  tablet (600 mg total) by mouth 3 (three) times daily.  90 tablet  3  . ibuprofen (ADVIL,MOTRIN) 600 MG tablet Take 1 tablet (600 mg total) by mouth every 8 (eight) hours as needed for pain.  60 tablet  3  . losartan (COZAAR) 100 MG tablet take 1 tablet by mouth once daily  30 tablet  11  . metoprolol succinate (TOPROL-XL) 100 MG 24 hr tablet take 1 tablet by mouth every morning  30 tablet  11  . Multiple Vitamin (MULTIVITAMINS PO) Take 1 tablet by mouth daily.        . Omega-3 Fatty Acids (FISH OIL) 1000 MG CAPS Take 2 capsules by mouth daily.        . traMADol (ULTRAM) 50 MG tablet Take 1 tablet (50 mg total) by mouth every 8 (eight) hours as needed for pain.  60 tablet  3  . travoprost, benzalkonium, (TRAVATAN Z) 0.004 % ophthalmic solution Place 1 drop into both eyes at bedtime.        No current facility-administered medications for this visit.    Family History  Problem Relation Age of Onset  . Cancer Sister 87    breast  . Stroke Father 19    died  . Stroke Mother 9  cerebral hemorrhage  . Hypertension Father   . Hypertension Mother     ROS:  Pertinent items are noted in HPI.  Otherwise, a comprehensive ROS was negative.  Exam:   BP 106/60  Pulse 64  Resp 16  Ht 5\' 4"  (1.626 m)  Wt 175 lb (79.379 kg)  BMI 30.02 kg/m2  LMP 04/02/1996 Height: 5\' 4"  (162.6 cm)  Ht Readings from Last 3 Encounters:  09/17/12 5\' 4"  (1.626 m)  09/09/12 5' 4.5" (1.638 m)  08/05/12 5' 4.5" (1.638 m)    General appearance: alert, cooperative and appears stated age Head: Normocephalic, without obvious abnormality, atraumatic Neck: no adenopathy, supple, symmetrical, trachea midline and thyroid normal to inspection and palpation Lungs: clear to auscultation bilaterally Breasts: normal appearance, no masses or tenderness, No nipple retraction or dimpling, No nipple discharge or bleeding, No axillary or supraclavicular adenopathy Heart: regular rate and rhythm Abdomen: soft, non-tender; no  masses,  no organomegaly Extremities: extremities normal, atraumatic, no cyanosis or edema Skin: Skin color, texture, turgor normal. No rashes or lesions Lymph nodes: Cervical, supraclavicular, and axillary nodes normal. No abnormal inguinal nodes palpated Neurologic: Grossly normal   Pelvic: External genitalia:  no lesions              Urethra:  normal appearing urethra with no masses, tenderness or lesions              Bartholin's and Skene's: normal                 Vagina: normal appearing vagina with normal color and discharge, no lesions              Cervix: normal, non trender              Pap taken: no Bimanual Exam:  Uterus:  normal size, contour, position, consistency, mobility, non-tender and mid position              Adnexa: normal adnexa and no mass, fullness, tenderness               Rectovaginal: Confirms               Anus:  normal sphincter tone, no lesions  A:  Well Woman with normal exam  Menopausal no HRT  Hypertension well controlled with medication and PCP management  P: Reviewed health and wellness pertinent to exam  Aware of need to evaluate in vaginal bleeding  Continue follow up with PCP as indicated  EXB:MWUX      Pap smear as per guidelines   Mammogram yearly pap smear not taken today  counseled on breast self exam, mammography screening, menopause, adequate intake of calcium and vitamin D, diet and exercise, Kegel's exercises  return annually or prn  An After Visit Summary was printed and given to the patient.  Reviewed, TL

## 2012-10-24 ENCOUNTER — Other Ambulatory Visit: Payer: Self-pay | Admitting: Family Medicine

## 2012-12-26 ENCOUNTER — Ambulatory Visit (INDEPENDENT_AMBULATORY_CARE_PROVIDER_SITE_OTHER): Payer: Medicare Other | Admitting: Family Medicine

## 2012-12-26 ENCOUNTER — Encounter: Payer: Self-pay | Admitting: Family Medicine

## 2012-12-26 VITALS — BP 146/82 | HR 60 | Ht 64.0 in | Wt 175.4 lb

## 2012-12-26 DIAGNOSIS — Z23 Encounter for immunization: Secondary | ICD-10-CM | POA: Diagnosis not present

## 2012-12-26 DIAGNOSIS — M1710 Unilateral primary osteoarthritis, unspecified knee: Secondary | ICD-10-CM | POA: Diagnosis not present

## 2012-12-26 DIAGNOSIS — IMO0002 Reserved for concepts with insufficient information to code with codable children: Secondary | ICD-10-CM | POA: Diagnosis not present

## 2012-12-26 DIAGNOSIS — M1711 Unilateral primary osteoarthritis, right knee: Secondary | ICD-10-CM

## 2012-12-26 HISTORY — DX: Unilateral primary osteoarthritis, right knee: M17.11

## 2012-12-26 NOTE — Progress Notes (Addendum)
  Subjective:    Patient ID: Laura Griffin, female    DOB: 06-May-1945, 67 y.o.   MRN: [id number]  HPI 67 year old female presents for evaluation of right knee pain. She has a history of arthritis of the right knee that was diagnosed by x-ray performed in 2007. Patient states that for the past few months she's had increasing burning sensation over the anterior portion of her right lower extremity that is radiating from her right knee, she states that she takes Tylenol and ibuprofen as needed for pain, she also has tramadol which she only uses once or twice per month for her knee pain, she does not exercise regularly, she does not ice the area regularly, she denies increased swelling in the right knee, she is currently on gabapentin for history of cervical radiculopathy, her symptoms have not been relieved with the regular use of gabapentin, patient states that she has had previous right knee steroid injection which provided minimal relief of her symptoms, she does have times where she is asymptomatic without pain, her pain is controlled with Motrin and Tylenol as needed, denies giving out of the knee, she does have occasional locking and clicking of the right knee joint, no new numbness or tingling in her bilateral feet   Review of Systems  Constitutional: Negative for fever, chills and fatigue.  Respiratory: Negative for apnea and cough.   Cardiovascular: Negative for chest pain.  Neurological: Negative for weakness and numbness.       Objective:   Physical Exam Vitals: reviewed General: Pleasant African American female, no acute distress Cardiac: Regular rate and rhythm, S1 and S2 present, no murmurs, no heaves or thrills Respiratory: Clear to auscultation bilaterally, normal effort Muscle skeletal: Right knee-mild suprapatellar swelling noted, tenderness noted over the medial joint line, no patellar or patellar tendon tenderness noted, crepitus noted with flexion and extension of the right  knee, but when testing including Lachman/varus/valgus stress testing was unremarkable, patient did have significant pain and crepitus with McMurray's testing; left knee-no suprapatellar swelling noted, no joint tenderness, no patellar or patellar tendon tenderness noted, mild crepitus noted with flexion and extension of the left knee, ligament testing including Lachman/varus/valgus stress testing was unremarkable, McMurray's testing was negative; right hip-no pain noted with flexion/internal rotation/external rotation; left hip-no pain with flexion/internal/external rotation; mild tenderness noted over the right IT band with palpation, lumbar spine-no midline or paraspinal tenderness noted, bilateral straight leg testing was unremarkable Neuro: Strength is 5 out of 5 to lower extremity flexion and extension, strength was 5 to plantar and dorsi flexion, sensation to light touch was intact grossly in bilateral lower extremities Vascular: No edema noted, dorsalis pedis pulses were 2+ bilaterally      Assessment & Plan: please see problem specific assessment and plan.

## 2012-12-26 NOTE — Assessment & Plan Note (Signed)
Osteoarthritis of the right knee based on clinical exam and previous x-rays performed in 2007. She likely has degenerative change of the meniscus given positive McMurray's testing today. Patient is not interested in steroid injection today. -Will continue conservative therapy with Motrin and Tylenol as needed for pain. She was also counseled to ice the affected area 2-3 times daily. Patient was counseled to exercise regularly. -If symptoms persist could consider repeat imaging of the right knee

## 2012-12-26 NOTE — Patient Instructions (Addendum)
Right knee pain - continue to take motrin/tylenol as needed for pain, may take Tramadol for severe pain, continue to take Gabapentin for nerve pain, ice your right knee for 15 minutes 2-3 times per day, continue moderate exercise as tolerated, if pain persists could consider steroid injections in the future.

## 2013-02-23 ENCOUNTER — Other Ambulatory Visit: Payer: Self-pay | Admitting: Family Medicine

## 2013-03-30 ENCOUNTER — Telehealth: Payer: Self-pay | Admitting: Certified Nurse Midwife

## 2013-03-30 ENCOUNTER — Encounter: Payer: Self-pay | Admitting: Obstetrics & Gynecology

## 2013-03-30 ENCOUNTER — Ambulatory Visit (INDEPENDENT_AMBULATORY_CARE_PROVIDER_SITE_OTHER): Payer: Medicare Other | Admitting: Obstetrics & Gynecology

## 2013-03-30 VITALS — BP 132/84 | HR 64 | Resp 16 | Ht 64.0 in | Wt 177.6 lb

## 2013-03-30 DIAGNOSIS — N950 Postmenopausal bleeding: Secondary | ICD-10-CM | POA: Diagnosis not present

## 2013-03-30 DIAGNOSIS — N859 Noninflammatory disorder of uterus, unspecified: Secondary | ICD-10-CM | POA: Diagnosis not present

## 2013-03-30 NOTE — Progress Notes (Signed)
Subjective:     Patient ID: Laura Griffin, female   DOB: [birth year], 67 y.o.   MRN: [id number]  HPI 4 G1P1 MAAF here with episode of PMP bleeding.  She saw it once on Christmas Eve when she was in the bathroom and wiped.  Tissue was pink/red and she saw the same thing in the toilet.  Patient is not sure this was vaginal or rectal or urinary.  She did have a bowel movement at the same time.    She reports worsening pain in the right leg/hip.  This is often weather related for her so she really doesn't think this is related.  She does have some urinary urgency.  This is not a new symptom for her.  She doesn't think it is any worse over the last few weeks.    Has been on a baby aspirin for years.  Was never on HRT.   Review of Systems  All other systems reviewed and are negative.       Objective:   Physical Exam  Constitutional: She is oriented to person, place, and time. She appears well-developed and well-nourished.  Abdominal: Soft. Bowel sounds are normal.  Genitourinary: Vagina normal and uterus normal.  Musculoskeletal: Normal range of motion.  Neurological: She is alert and oriented to person, place, and time.  Skin: Skin is warm and dry.  Psychiatric: She has a normal mood and affect.   Endometrial biopsy recommended.  Discussed with patient.  Verbal and written consent obtained.   Procedure:  Speculum placed.  Cervix visualized and cleansed with betadine prep.  A single toothed tenaculum was applied to the anterior lip of the cervix.  Endometrial pipelle was advanced through the cervix into the endometrial cavity without difficulty.  Pipelle passed to 7cm.  Suction applied and pipelle removed with good tissue sample obtained.  Biopsy was performed twice due to small amount of tissue with biopsy.  Tenculum removed.  No bleeding noted.  Patient tolerated procedure well.     Assessment:     PMP bleeding, unsure if this is vaginal     Plan:     Endometrial biopsy pending.   Results to be called to patient. IFOB given U/A today negative

## 2013-03-30 NOTE — Patient Instructions (Signed)

## 2013-03-30 NOTE — Telephone Encounter (Signed)
Reports episode of vaginal bleeding on 03-25-13. None now but requests OV today to be checked.  OV 215 today with Dr Hyacinth Meeker.  Routing to provider for final review. Patient agreeable to disposition. Will close encounter

## 2013-03-30 NOTE — Telephone Encounter (Signed)
Patient had some bleeding on christmas eve. She Only noticed it when she went to the bathroom once She noticed it when she started to flush. Put on a pad and it was clean and didn't notice it anymore. Was concerned  about it.

## 2013-04-29 DIAGNOSIS — H4011X0 Primary open-angle glaucoma, stage unspecified: Secondary | ICD-10-CM | POA: Diagnosis not present

## 2013-05-24 ENCOUNTER — Other Ambulatory Visit: Payer: Self-pay | Admitting: Family Medicine

## 2013-06-12 ENCOUNTER — Encounter: Payer: Self-pay | Admitting: Family Medicine

## 2013-06-12 ENCOUNTER — Ambulatory Visit (INDEPENDENT_AMBULATORY_CARE_PROVIDER_SITE_OTHER): Payer: Medicare Other | Admitting: Family Medicine

## 2013-06-12 VITALS — BP 146/90 | HR 64 | Ht 64.0 in | Wt 179.0 lb

## 2013-06-12 DIAGNOSIS — I10 Essential (primary) hypertension: Secondary | ICD-10-CM | POA: Diagnosis not present

## 2013-06-12 DIAGNOSIS — M5412 Radiculopathy, cervical region: Secondary | ICD-10-CM

## 2013-06-12 DIAGNOSIS — I951 Orthostatic hypotension: Secondary | ICD-10-CM | POA: Diagnosis not present

## 2013-06-12 DIAGNOSIS — M1990 Unspecified osteoarthritis, unspecified site: Secondary | ICD-10-CM | POA: Diagnosis not present

## 2013-06-12 HISTORY — DX: Orthostatic hypotension: I95.1

## 2013-06-12 LAB — CBC
HEMATOCRIT: 35.7 % — AB (ref 36.0–46.0)
Hemoglobin: 11.8 g/dL — ABNORMAL LOW (ref 12.0–15.0)
MCH: 28.6 pg (ref 26.0–34.0)
MCHC: 33.1 g/dL (ref 30.0–36.0)
MCV: 86.4 fL (ref 78.0–100.0)
Platelets: 312 10*3/uL (ref 150–400)
RBC: 4.13 MIL/uL (ref 3.87–5.11)
RDW: 14.4 % (ref 11.5–15.5)
WBC: 4.4 10*3/uL (ref 4.0–10.5)

## 2013-06-12 MED ORDER — GABAPENTIN 600 MG PO TABS: ORAL_TABLET | ORAL | Status: DC

## 2013-06-12 MED ORDER — LOSARTAN POTASSIUM 100 MG PO TABS: 100.0000 mg | ORAL_TABLET | Freq: Every day | ORAL | Status: DC

## 2013-06-12 NOTE — Assessment & Plan Note (Signed)
Suspect that OA is causing right hand pain. -Patient to take Tylenol daily -Ice the affected hand 2-3 times daily

## 2013-06-12 NOTE — Progress Notes (Signed)
   Subjective:    Patient ID: Laura Griffin, female    DOB: 09/28/1945, 68 y.o.   MRN: [id number]  HPI 68 year old female presents for followup of multiple medical conditions.  Hypertension-patient currently on amlodipine 10 mg daily, losartan 100 mg daily, and Toprol 100 mg daily, patient reports no associated chest pain or vision changes, no side effects from the medications, she does report occasional "vertigo", this has occurred twice over the past month, she states that in both instances she was sitting up from a lying position and experienced lightheadedness and spinning of the room, the symptoms only lasted for 1-2 minutes, no loss of consciousness, she denies associated symptoms with turning of her head or certain head positions, she denies new numbness or weakness of her extremities, she denies changes in her bowel habits, denies melena, denies hematochezia  Right hand pain-patient reports pain in the right MTP joints of the second through fifth digit, has a present for the past few months, pain is constant throughout the day, she does report some mild morning stiffness that improves throughout the day, she reports that she has no weakness of the hand however 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  HENT: Negative for congestion, postnasal drip and rhinorrhea.   Respiratory: Negative for cough and choking.   Cardiovascular: Negative for chest pain.  Gastrointestinal: Negative for nausea, vomiting, abdominal pain and  diarrhea.  Neurological: Positive for dizziness and light-headedness.       Objective:   Physical Exam Vitals: Reviewed General: Pleasant African American female, no acute distress HEENT: Normocephalic, pupils are equal round and reactive to light, extraocular movements are intact, no nystagmus, moist mucous membranes, uvula midline, neck was supple, no thyromegaly, no anterior posterior cervical lymphadenopathy Cardiac: Regular in rhythm, S1 and S2 present, no murmurs, no heaves or thrills Respiratory: Clear to patient bilaterally, normal effort MSK: Cervical range of motion was full, no cervical neck tenderness, patient has tenderness over the right MTP joints and digits 2 through 5, tinels test negative, Phalen's test negative, mild joint swelling at the MTP joint, no ulnar deviation Neuro: Strength 5 out of 5 in bilateral upper extremity, sensation to light touch was grossly intact in all extremities, Romberg negative, pronator drift unremarkable  Orthostatic blood pressures reviewed and are within normal limits.     Assessment & Plan:  Please see problem specific assessment and plan.

## 2013-06-12 NOTE — Assessment & Plan Note (Signed)
Slightly elevated today. Given recent symptoms of orthostatic hypotension will not change medications today. -Return for follow up in 2-4 weeks.

## 2013-06-12 NOTE — Assessment & Plan Note (Signed)
Stable of Gabapentin. Refill provided.

## 2013-06-12 NOTE — Patient Instructions (Addendum)
Nerve Pain - continue Neurontin 600 mg daily, may cut back as tolerated (start with 1/2 tablet in the morning and normal 1 tablet with lunch and at nighttime).  Hand Pain - suspect arthritis, take Tylenol 500 mg three times daily and ice the hand 2-3 times daily for 10-15 minutes  Lightheadedness/Dizzness - likely due to orthostatic hypotension, have lab work completed, follow up with Laura Griffin in 2-4 weeks  Schedule your yearly Medicare visit with Laura Griffin.  We will discuss your difficulty with swallowing at your next visit.

## 2013-06-12 NOTE — Assessment & Plan Note (Signed)
Patient has symptoms consistent with orthostatic hypotension. Although orthostatics in office were unremarkable her symptoms are classic in nature. -At this time conservative management was discussed including sitting up and standing slowly from a lying in the seated position respectively, encourage by mouth intake -Will check CBC to rule out anemia -If symptoms persist could consider decreasing dose of metoprolol

## 2013-06-15 ENCOUNTER — Encounter: Payer: Self-pay | Admitting: Family Medicine

## 2013-07-06 ENCOUNTER — Encounter: Payer: Self-pay | Admitting: Family Medicine

## 2013-07-06 ENCOUNTER — Ambulatory Visit (INDEPENDENT_AMBULATORY_CARE_PROVIDER_SITE_OTHER): Payer: Medicare Other | Admitting: Family Medicine

## 2013-07-06 ENCOUNTER — Ambulatory Visit: Payer: Medicare Other | Admitting: Home Health Services

## 2013-07-06 VITALS — BP 147/81 | HR 60 | Ht 64.0 in | Wt 179.0 lb

## 2013-07-06 DIAGNOSIS — I951 Orthostatic hypotension: Secondary | ICD-10-CM

## 2013-07-06 DIAGNOSIS — R1310 Dysphagia, unspecified: Secondary | ICD-10-CM | POA: Insufficient documentation

## 2013-07-06 HISTORY — DX: Dysphagia, unspecified: R13.10

## 2013-07-06 MED ORDER — METOPROLOL SUCCINATE ER 100 MG PO TB24: 100.0000 mg | ORAL_TABLET | Freq: Every day | ORAL | Status: DC

## 2013-07-06 MED ORDER — LOSARTAN POTASSIUM 100 MG PO TABS: 100.0000 mg | ORAL_TABLET | Freq: Every day | ORAL | Status: DC

## 2013-07-06 NOTE — Patient Instructions (Addendum)
Tylenol - May take up to 1000 mg three times a day.   Difficulty with swallowing - a referral to the GI physician has been made, check TSH today (thyroid).

## 2013-07-07 ENCOUNTER — Encounter: Payer: Self-pay | Admitting: Family Medicine

## 2013-07-07 DIAGNOSIS — Z803 Family history of malignant neoplasm of breast: Secondary | ICD-10-CM | POA: Diagnosis not present

## 2013-07-07 DIAGNOSIS — Z1231 Encounter for screening mammogram for malignant neoplasm of breast: Secondary | ICD-10-CM | POA: Diagnosis not present

## 2013-07-07 LAB — TSH: TSH: 0.807 u[IU]/mL (ref 0.350–4.500)

## 2013-07-07 NOTE — Assessment & Plan Note (Signed)
Resolved from previous visit. No further workup at this time.

## 2013-07-07 NOTE — Assessment & Plan Note (Signed)
Patient reports intermittent difficulty swallowing. No evidence of thyromegaly on exam. -check TSH to evaluate thyroid function -refer to GI for evaluation of upper GI system

## 2013-07-07 NOTE — Progress Notes (Signed)
   Subjective:    Patient ID: Laura Griffin, female    DOB: [birth year], 68 y.o.   MRN: [id number]  HPI 68 y/o female presents for difficulty swallowing. She report food getting stuck (solics and pills worse than liquids) intermittently over the past few months, does not occur daily, no associated abdominal pain or nausea, she feels like there is "a lump in her throat", no sob, no cough, no fevers/chills, she does have occasional heartburn that is not associated with her current symptoms.   Dizziness/Orhthostatic hypotension is resolved from previous visit. No further episodes.  Review of Systems  Constitutional: Negative for fever.  Respiratory: Negative for cough and shortness of breath.   Cardiovascular: Negative for chest pain.  Gastrointestinal: Negative for nausea, vomiting, abdominal pain, diarrhea, constipation and abdominal distention.       Objective:   Physical Exam Vitals: reviewed Gen: pleasant female, NAD HEENT: normocephalic, PERRL, EOMI, no scleral icterus, MMM, clear oropharynx, no pharyngeal erythema or exudate, neck supple, no thyromegaly, no cervical lymphadenopathy Resp: CTAB, normal effort Cardiac: RRR, S1 and S2 present, no murmurs Abd: soft, no tenderness, normal bowel sounds       Assessment & Plan:  Please see problem specific assessment and plan.

## 2013-07-08 ENCOUNTER — Telehealth: Payer: Self-pay | Admitting: Family Medicine

## 2013-07-08 NOTE — Telephone Encounter (Signed)
Pt called [facility] and she cannot get an appt until June. She had a colonoscopy at [facility] about 10 yrs ago. Is it possible to go there?  She doesn't feel like she can wait until June Please advise

## 2013-07-08 NOTE — Telephone Encounter (Signed)
Called pt. She will call [facility] and call us back, once she has the appt. Arlyss Repress.Maryn Freelove

## 2013-07-09 ENCOUNTER — Ambulatory Visit (INDEPENDENT_AMBULATORY_CARE_PROVIDER_SITE_OTHER): Payer: Medicare Other | Admitting: Home Health Services

## 2013-07-09 ENCOUNTER — Encounter: Payer: Self-pay | Admitting: Home Health Services

## 2013-07-09 VITALS — BP 135/80 | HR 55 | Temp 96.6°F | Ht 64.0 in | Wt 179.0 lb

## 2013-07-09 DIAGNOSIS — Z0000 Encounter for general adult medical examination without abnormal findings: Secondary | ICD-10-CM | POA: Diagnosis not present

## 2013-07-09 DIAGNOSIS — Z23 Encounter for immunization: Secondary | ICD-10-CM | POA: Diagnosis not present

## 2013-07-09 NOTE — Progress Notes (Signed)
Patient here for annual wellness visit, patient reports: Risk Factors/Conditions needing evaluation or treatment: Pt does not have any new risk factors that need evaluation. Home Safety: Pt lives with husband in 1 story home.  Pt reports having smoke detectors and does not have adaptive equipment in bathroom. Other Information: Corrective lens: Pt wears corrective lens for reading/watching tv.  Pt has annual eye exams. Dentures: Pt does not have dentures.  Has regular dental exams.  Memory: Pt denies any memory problems. [name] Mini Mental Score (recorded in doc. flowsheet): 30 Hearing:  Pt reports no new problems with hearing.  Balance/Gait: Pt reports no falls in the past 12 months.  We discussed home safety and fall prevention.      Annual Wellness Visit Requirements Recorded Today In  Medical, family, social history Past Medical, Family, Social History Section  Current providers Care team  Current medications Medications  Wt, BP, Ht, BMI Vital signs  Tobacco, alcohol, illicit drug use History  ADL Nurse Assessment  Depression Screening Nurse Assessment  Cognitive impairment Nurse Assessment  Mini Mental Status Document Flowsheet  Fall Risk Fall/Depression  Home Safety Progress Note  End of Life Planning (welcome visit) Social Documentation  Medicare preventative services Progress Note  Risk factors/conditions needing evaluation/treatment Progress Note  Personalized health advice Patient Instructions, goals, letter  Diet & Exercise Social Documentation  Emergency Contact Social Documentation  Seat Belts Social Documentation  Sun exposure/protection Social Documentation

## 2013-07-10 ENCOUNTER — Encounter: Payer: Self-pay | Admitting: Home Health Services

## 2013-07-10 NOTE — Progress Notes (Signed)
Patient ID: Laura Griffin, female   DOB: 1946/01/10, 68 y.o.   MRN: [id number]  I have reviewed the encounter completed by Arlys JohnSuzanne Lineberry and agree with the assessment/plan as documented.   Donnella ShamKyle Fletke MD

## 2013-07-22 DIAGNOSIS — H4011X0 Primary open-angle glaucoma, stage unspecified: Secondary | ICD-10-CM | POA: Diagnosis not present

## 2013-07-22 DIAGNOSIS — Z961 Presence of intraocular lens: Secondary | ICD-10-CM | POA: Diagnosis not present

## 2013-07-23 DIAGNOSIS — R1312 Dysphagia, oropharyngeal phase: Secondary | ICD-10-CM | POA: Diagnosis not present

## 2013-07-23 DIAGNOSIS — K219 Gastro-esophageal reflux disease without esophagitis: Secondary | ICD-10-CM | POA: Diagnosis not present

## 2013-08-06 ENCOUNTER — Encounter: Payer: Self-pay | Admitting: Family Medicine

## 2013-08-19 DIAGNOSIS — R1312 Dysphagia, oropharyngeal phase: Secondary | ICD-10-CM | POA: Diagnosis not present

## 2013-08-19 DIAGNOSIS — R05 Cough: Secondary | ICD-10-CM | POA: Diagnosis not present

## 2013-08-19 DIAGNOSIS — R059 Cough, unspecified: Secondary | ICD-10-CM | POA: Diagnosis not present

## 2013-08-31 ENCOUNTER — Ambulatory Visit (INDEPENDENT_AMBULATORY_CARE_PROVIDER_SITE_OTHER): Payer: Medicare Other | Admitting: Family Medicine

## 2013-08-31 VITALS — BP 112/71 | HR 55 | Temp 98.2°F | Ht 64.0 in | Wt 182.0 lb

## 2013-08-31 DIAGNOSIS — R1310 Dysphagia, unspecified: Secondary | ICD-10-CM | POA: Diagnosis not present

## 2013-08-31 NOTE — Assessment & Plan Note (Signed)
Symptoms mildly improved after positioning changes recommended by GI physician. No plan for EGD at this time.  -As patient is still having symptoms will refer to speech therapy for swallowing evaluation.

## 2013-08-31 NOTE — Progress Notes (Signed)
   Subjective:    Patient ID: Carie Caddy, female    DOB: 28-Jun-1945, 68 y.o.   MRN: [id number]  HPI 68 y/o female presents for follow up of difficulty swallowing, primarily medications, no difficulty with liquids/solids, was recently seen by GI and recommended position changes (tuck chin when swallowing), she has had some improvement of symptoms, also reports feeling of something stuck in throat intermittently, has some intermittent short lived sob with this, lasts for seconds, relieved with drinking water, dry cough  Unable to view GI note in EPIC  Review of Systems  Constitutional: Negative for fever, chills and fatigue.  HENT: Negative for mouth sores, postnasal drip, rhinorrhea and voice change.   Respiratory: Negative for chest tightness and shortness of breath.   Cardiovascular: Negative for chest pain.  Gastrointestinal: Negative for nausea, vomiting and diarrhea.       Objective:   Physical Exam Vitals: reviewed Gen: pleasant female, NAD HEENT: normocephalic, PERRL, EOMI, no scleral icterus, nasal septum midline, no rhinorrhea, MMM, uvula midline, no pharyngeal erythema or exudate, neck supple, no cervical lymphadenopathy Cardiac: RRR, S1 and S2 present, no murmurs Resp: CTAB, normal effort      Assessment & Plan:  Please see problem specific assessment and plan.

## 2013-08-31 NOTE — Patient Instructions (Addendum)
Laura Griffin you have been referred to speech therapy. You should be contacted by speech in the next few days to schedule an appointment.

## 2013-09-11 ENCOUNTER — Other Ambulatory Visit: Payer: Self-pay | Admitting: *Deleted

## 2013-09-11 ENCOUNTER — Other Ambulatory Visit (HOSPITAL_COMMUNITY): Payer: Self-pay | Admitting: Family Medicine

## 2013-09-11 DIAGNOSIS — R1310 Dysphagia, unspecified: Secondary | ICD-10-CM

## 2013-09-16 ENCOUNTER — Ambulatory Visit (HOSPITAL_COMMUNITY)
Admission: RE | Admit: 2013-09-16 | Discharge: 2013-09-16 | Disposition: A | Discharge status: Home / Self Care | Payer: Medicare Other | Source: Ambulatory Visit | Attending: Family Medicine | Admitting: Family Medicine

## 2013-09-16 DIAGNOSIS — R1310 Dysphagia, unspecified: Secondary | ICD-10-CM

## 2013-09-16 NOTE — Procedures (Signed)
Objective Swallowing Evaluation: Modified Barium Swallowing Study  Patient Details  Name: Laura Griffin MRN: [id number] Date of Birth: 1945/11/15  Today's Date: 09/16/2013 Time: [id number] SLP Time Calculation (min): 23 min  Past Medical History:  Past Medical History  Diagnosis Date  . Left sided sciatica 8/95,11/00    CT confirmed   . Bursitis of shoulder, right 09/19/1999  . Arthritis of knee, right 03/11/2006  . Diverticulosis   . Glaucoma   . Osteoarthritis   . Mitral valve prolapse     per patient heart murmur  . Hypertension    Past Surgical History:  Past Surgical History  Procedure Laterality Date  . Tubal ligation  1973  . Vulva /perineum biopsy  2010    for hypopigmentation, non-specific result  . Cataract extraction  02/2011    left eye   HPI:  Pt. is a very pleasant 68 year old who presents to radiology for outpatient MBS accompanied by husband with complaints of "lump in throat, wheezing" and instances of "food/liquid coming back up".  GI consult 07/2013 recommended chin tuck with pills.  Pt. experienced mild improvements in symptoms, therefore, GI MD recommended ST assessment with MBS.  PMH: reflux/indigestion (takes Tums PRN) and arthritis.  Pt. denies pna.      Assessment / Plan / Recommendation Clinical Impression  Dysphagia Diagnosis: Within Functional Limits (overall WFl, min intermittent residue valleculae) Clinical impression: Overall, oropharyngeal swallow function is within functional limits.  Minimally and brief delayed initiation to pyriform sinuses with thin barium observed x 1 and min-mild residue in valleculae.  No laryngeal penetration or aspiration observed and do not suspect she is aspirating.  MBS does not diagnose esophageal impairments below the level of the upper esophageal sphincter, however, esophagus was scanned x 2 and did not reveal abnormalities (no radiologist present).  Barium pill passed through pharynx and esophagus without  difficulty. Suspect pt.'s symptoms originate from an esophageal etiology.  Pt. educated on esophageal precautions to mitigate symptoms (sit upright 45 min after meals, alternate liquids and solids, eat smaller meals, acidic/fatty foods in moderation).  Also advised pt. to discuss medical management of possible reflux if safe/warranted and recommended by MD.  Recommend pt. continue regular texture diet and thin liquids with esophageal precautions.    Treatment Recommendation  No treatment recommended at this time    Diet Recommendation Regular;Thin liquid   Liquid Administration via: Cup;Straw Medication Administration: Whole meds with liquid Supervision: Patient able to self feed Compensations: Slow rate;Small sips/bites;Follow solids with liquid Postural Changes and/or Swallow Maneuvers: Seated upright 90 degrees;Upright 30-60 min after meal    Other  Recommendations Oral Care Recommendations: Oral care BID   Follow Up Recommendations  None    Frequency and Duration     N/A              Reason for Referral Objectively evaluate swallowing function   Oral Phase Oral Preparation/Oral Phase Oral Phase: WFL   Pharyngeal Phase Pharyngeal Phase Pharyngeal Phase: Impaired Pharyngeal - Thin Pharyngeal - Thin Cup: Delayed swallow initiation;Premature spillage to pyriform sinuses;Pharyngeal residue - valleculae;Reduced tongue base retraction Pharyngeal - Thin Straw: Pharyngeal residue - valleculae;Reduced tongue base retraction Pharyngeal - Solids Pharyngeal - Pill: Within functional limits  Cervical Esophageal Phase    GO    Cervical Esophageal Phase [facility]erv[facility]ophageal [facility]    Functional Assessment Tool Used: skilled clinical judgement Functional Limitations: Swallowing Swallow Current Status (M8413(G8996): At least 1 percent but less than 20 percent impaired, limited or  restricted Swallow Goal Status [phone](G8997): At least 1 percent but less than 20 percent impaired, limited  or restricted Swallow Discharge Status [phone](G8998): At least 1 percent but less than 20 percent impaired, limited or restricted    Royce MacadamiaLisa Willis Litaker M.Ed [employer] Pager [phone]  09/16/2013

## 2013-10-12 ENCOUNTER — Encounter: Payer: Self-pay | Admitting: Obstetrics & Gynecology

## 2013-10-12 NOTE — Progress Notes (Signed)
Pt did not return ifob kit.

## 2013-10-15 ENCOUNTER — Telehealth: Payer: Self-pay | Admitting: Obstetrics & Gynecology

## 2013-10-15 NOTE — Telephone Encounter (Signed)
Patient says she received two letters from our office. The first one saying she did not return a stool kit. Patient says she did return the kit and got a letter saying it ws negative. The second letter said she was due for an aex. Patient said she had her aex done at the her last visit. Patient was not charged for an aex at her last visit. (she was not due at that time) Patient thinks she had a pap at this visit also? I see that she had an endo biopsy 03/20/13.

## 2013-10-19 NOTE — Telephone Encounter (Signed)
Patient had last A[facility]18-14 with Debbi leonard, CNM and was given IFOB kit at that time. Saw dr Sabra Heck 03-21-13 for PMB, endo biopsy done and given IFOB kit since patient was not sure of source of bleeding.   Patient states she does not recall receiving IFOB from Debbi at [facility] but she did return the kit from dr Sabra Heck and received a call that IFOB and biopsy were negative. See result note from 04-03-13. Advised I will let Dr Sabra Heck know about the returned IFOB from December. Advised she is due for annual exam including breast and pelvic exams which is different from the problem visit in December 2014. She will call back to schedule this, may delay slightly since this is not covered by insurance.  Routing to provider for final review. Patient agreeable to disposition. Will close encounter To Dr Quincy Simmonds, Dr Sabra Heck out of office this week.

## 2013-11-03 ENCOUNTER — Ambulatory Visit (INDEPENDENT_AMBULATORY_CARE_PROVIDER_SITE_OTHER): Payer: Medicare Other | Admitting: Certified Nurse Midwife

## 2013-11-03 ENCOUNTER — Encounter: Payer: Self-pay | Admitting: Certified Nurse Midwife

## 2013-11-03 VITALS — BP 132/80 | HR 72 | Resp 18 | Ht 64.0 in | Wt 185.0 lb

## 2013-11-03 DIAGNOSIS — H1130 Conjunctival hemorrhage, unspecified eye: Secondary | ICD-10-CM | POA: Diagnosis not present

## 2013-11-03 DIAGNOSIS — Z01419 Encounter for gynecological examination (general) (routine) without abnormal findings: Secondary | ICD-10-CM | POA: Diagnosis not present

## 2013-11-03 DIAGNOSIS — Z124 Encounter for screening for malignant neoplasm of cervix: Secondary | ICD-10-CM | POA: Diagnosis not present

## 2013-11-03 NOTE — Patient Instructions (Signed)

## 2013-11-03 NOTE — Progress Notes (Deleted)
68 y.o. G1P1 Married African American Fe here for annual exam.    [name] last menstrual period was 04/02/1996.          Sexually active: No.  The current method of family planning is post menopausal status.    Exercising: Yes.    fitness equipment at senior center Smoker:  no  Health Maintenance: Pap: 08-08-10 neg MMG: 07-07-13 breast density category a, birads category 1:negative Colonoscopy: 6/06 BMD:   2011 TDaP:  2008 Labs: PCP Self breast exam:not as often   reports that she quit smoking about 26 years ago. Her smoking use included Cigarettes. She has a 7.5 pack-year smoking history. She quit smokeless tobacco use about 17 years ago. She reports that she does not drink alcohol or use illicit drugs.  Past Medical History  Diagnosis Date  . Left sided sciatica 8/95,11/00    CT confirmed   . Bursitis of shoulder, right 09/19/1999  . Arthritis of knee, right 03/11/2006  . Diverticulosis   . Glaucoma   . Osteoarthritis   . Mitral valve prolapse     per patient heart murmur  . Hypertension     Past Surgical History  Procedure Laterality Date  . Tubal ligation  1973  . Vulva /perineum biopsy  2010    for hypopigmentation, non-specific result  . Cataract extraction  02/2011    left eye    Current Outpatient Prescriptions  Medication Sig Dispense Refill  . acetaminophen (TYLENOL) 650 MG CR tablet Take 650 mg by mouth every 8 (eight) hours as needed.      [name] amLODipine (NORVASC) 10 MG tablet take 1 tablet by mouth once daily  90 tablet  1  . aspirin (BAYER CHILDRENS ASPIRIN) 81 MG chewable tablet Chew 81 mg by mouth daily.        . brimonidine-timolol (COMBIGAN) 0.2-0.5 % ophthalmic solution Place 2 drops into both eyes every 12 (twelve) hours.        . Cholecalciferol (VITAMIN D3) 1000 UNIT capsule Take 1,000 Units by mouth daily.        [name] gabapentin (NEURONTIN) 600 MG tablet take 1 tablet by mouth three times a day  90 tablet  3  . losartan (COZAAR) 100 MG tablet Take 1  tablet (100 mg total) by mouth daily.  90 tablet  1  . metoprolol succinate (TOPROL-XL) 100 MG 24 hr tablet Take 1 tablet (100 mg total) by mouth daily. Take with or immediately following a meal.  90 tablet  1  . Multiple Vitamin (MULTIVITAMINS PO) Take 1 tablet by mouth daily.        . Omega-3 Fatty Acids (FISH OIL) 1000 MG CAPS Take 2 capsules by mouth daily.        . traMADol (ULTRAM) 50 MG tablet Take 1 tablet (50 mg total) by mouth every 8 (eight) hours as needed for pain.  60 tablet  3  . travoprost, benzalkonium, (TRAVATAN Z) 0.004 % ophthalmic solution Place 1 drop into both eyes at bedtime.        No current facility-administered medications for this visit.    Family History  Problem Relation Age of Onset  . Cancer Sister 55    breast  . Stroke Father 51    died  . Stroke Mother 31    cerebral hemorrhage  . Hypertension Father   . Hypertension Mother     ROS:  Pertinent items are noted in HPI.  Otherwise, a comprehensive ROS was negative.  Exam:  LMP 04/02/1996    Ht Readings from Last 3 Encounters:  08/31/13  (1.626 m)  07/09/13  (1.626 m)  07/06/13  (1.626 m)    General appearance: alert, cooperative and appears stated age Head: Normocephalic, without obvious abnormality, atraumatic Neck: no adenopathy, supple, symmetrical, trachea midline and thyroid {EXAM; THYROID:18604} Lungs: clear to auscultation bilaterally Breasts: {Exam; breast:13139::"normal appearance, no masses or tenderness"} Heart: regular rate and rhythm Abdomen: soft, non-tender; no masses,  no organomegaly Extremities: extremities normal, atraumatic, no cyanosis or edema Skin: Skin color, texture, turgor normal. No rashes or lesions Lymph nodes: Cervical, supraclavicular, and axillary nodes normal. No abnormal inguinal nodes palpated Neurologic: Grossly normal   Pelvic: External genitalia:  no lesions              Urethra:  normal appearing urethra with no masses, tenderness or  lesions              Bartholin's and Skene's: normal                 Vagina: normal appearing vagina with normal color and discharge, no lesions              Cervix: {exam; cervix:14595}              Pap taken: {yes no:314532} Bimanual Exam:  Uterus:  {exam; uterus:12215}              Adnexa: {exam; adnexa:12223}               Rectovaginal: Confirms               Anus:  normal sphincter tone, no lesions  A:  Well Woman with normal exam  P:   Reviewed health and wellness pertinent to exam  Pap smear taken today  {plan; gyn:5269::"mammogram","pap smear","return annually or prn"}  An After Visit Summary was printed and given to the patient.

## 2013-11-03 NOTE — Progress Notes (Signed)
68 y.o. G1P1 Married African American Fe here for annual exam. Menopausal no HRT. Denies vaginal bleeding or vaginal dryness. Sees PCP Dr. Samuella Bruin for labs and medication management for Hypertension, and aex.  Exercising at senior center and feel it is helping with joint flexability. Patient takes daily Vitamin D and is due for BMD.  No other health issues today.  [name] last menstrual period was 04/02/1996.          Sexually active: No.  The current method of family planning is post menopausal status.    Exercising: Yes.    fitness equipment at senior center. 3-4 times a week Smoker:  no  Health Maintenance: Pap:  08/08/10 neg MMG:  07/07/13 BI-RADS Neg Colonoscopy:  2006, Normal BMD:   2011 TDaP:  2008 Labs: PCP   reports that she quit smoking about 26 years ago. Her smoking use included Cigarettes. She has a 7.5 pack-year smoking history. She quit smokeless tobacco use about 17 years ago. She reports that she does not drink alcohol or use illicit drugs.  Past Medical History  Diagnosis Date  . Left sided sciatica 8/95,11/00    CT confirmed   . Bursitis of shoulder, right 09/19/1999  . Arthritis of knee, right 03/11/2006  . Diverticulosis   . Glaucoma   . Osteoarthritis   . Mitral valve prolapse     per patient heart murmur  . Hypertension     Past Surgical History  Procedure Laterality Date  . Tubal ligation  1973  . Vulva /perineum biopsy  2010    for hypopigmentation, non-specific result  . Cataract extraction  02/2011    left eye    Current Outpatient Prescriptions  Medication Sig Dispense Refill  . acetaminophen (TYLENOL) 650 MG CR tablet Take 1,300 mg by mouth every 8 (eight) hours as needed.       [name] amLODipine (NORVASC) 10 MG tablet take 1 tablet by mouth once daily  90 tablet  1  . aspirin (BAYER CHILDRENS ASPIRIN) 81 MG chewable tablet Chew 81 mg by mouth daily.        . brimonidine-timolol (COMBIGAN) 0.2-0.5 % ophthalmic solution Place 2 drops into both eyes  every 12 (twelve) hours.        . Cholecalciferol (VITAMIN D3) 1000 UNIT capsule Take 1,000 Units by mouth daily.        [name] gabapentin (NEURONTIN) 600 MG tablet take 1 tablet by mouth three times a day  90 tablet  3  . losartan (COZAAR) 100 MG tablet Take 1 tablet (100 mg total) by mouth daily.  90 tablet  1  . metoprolol succinate (TOPROL-XL) 100 MG 24 hr tablet Take 1 tablet (100 mg total) by mouth daily. Take with or immediately following a meal.  90 tablet  1  . Multiple Vitamin (MULTIVITAMINS PO) Take 1 tablet by mouth daily.        . Omega-3 Fatty Acids (FISH OIL) 1000 MG CAPS Take 2 capsules by mouth daily.        . travoprost, benzalkonium, (TRAVATAN Z) 0.004 % ophthalmic solution Place 1 drop into both eyes at bedtime.        No current facility-administered medications for this visit.    Family History  Problem Relation Age of Onset  . Cancer Sister 71    breast  . Stroke Father 24    died  . Stroke Mother 74    cerebral hemorrhage  . Hypertension Father   . Hypertension Mother  ROS:  Pertinent items are noted in HPI.  Otherwise, a comprehensive ROS was negative.  Exam:   Ht 5\' 4"  (1.626 m)  Wt 185 lb (83.915 kg)  BMI 31.74 kg/m2  LMP 04/02/1996 Height: 5\' 4"  (162.6 cm)  Ht Readings from Last 3 Encounters:  11/03/13 5\' 4"  (1.626 m)  08/31/13 5\' 4"  (1.626 m)  07/09/13 5\' 4"  (1.626 m)    General appearance: alert, cooperative and appears stated age Head: Normocephalic, without obvious abnormality, atraumatic Neck: no adenopathy, supple, symmetrical, trachea midline and thyroid normal to inspection and palpation and non-palpable Lungs: clear to auscultation bilaterally Breasts: normal appearance, no masses or tenderness, No nipple retraction or dimpling, No nipple discharge or bleeding, No axillary or supraclavicular adenopathy Heart: regular rate and rhythm Abdomen: soft, non-tender; no masses,  no organomegaly Extremities: extremities normal, atraumatic, no  cyanosis or edema Skin: Skin color, texture, turgor normal. No rashes or lesions Lymph nodes: Cervical, supraclavicular, and axillary nodes normal. No abnormal inguinal nodes palpated Neurologic: Grossly normal   Pelvic: External genitalia:  no lesions              Urethra:  normal appearing urethra with no masses, tenderness or lesions              Bartholin's and Skene's: normal                 Vagina: normal appearing vagina with normal color and discharge, no lesions              Cervix: normal, atrophic appearance              Pap taken: No. Bimanual Exam:  Uterus:  normal size, contour, position, consistency, mobility, non-tender and mid position              Adnexa: normal adnexa and no mass, fullness, tenderness               Rectovaginal: Confirms               Anus:  normal sphincter tone, no lesions  A:  Well Woman with normal exam  Menopausal no HRT  Hypertension with PCP management  P:   Reviewed health and wellness pertinent to exam  Aware of need to advise if vaginal bleeding  Pap smear not taken today  Patient to schedule BMD( her request)   counseled on breast self exam, mammography screening, osteoporosis, adequate intake of calcium and vitamin D, diet and exercise  return annually or prn  An After Visit Summary was printed and given to the patient.

## 2013-11-04 NOTE — Progress Notes (Signed)
Reviewed personally.  M. Suzanne Kathelyn Gombos, MD.  

## 2013-11-19 ENCOUNTER — Other Ambulatory Visit: Payer: Self-pay | Admitting: Family Medicine

## 2013-12-15 ENCOUNTER — Ambulatory Visit: Payer: Medicare Other | Admitting: Family Medicine

## 2013-12-21 ENCOUNTER — Ambulatory Visit (INDEPENDENT_AMBULATORY_CARE_PROVIDER_SITE_OTHER): Payer: Medicare Other | Admitting: Family Medicine

## 2013-12-21 ENCOUNTER — Encounter: Payer: Self-pay | Admitting: Family Medicine

## 2013-12-21 VITALS — BP 116/73 | HR 52 | Temp 97.9°F | Ht 64.0 in | Wt 183.0 lb

## 2013-12-21 DIAGNOSIS — M25552 Pain in left hip: Secondary | ICD-10-CM

## 2013-12-21 DIAGNOSIS — M545 Low back pain, unspecified: Secondary | ICD-10-CM | POA: Diagnosis not present

## 2013-12-21 DIAGNOSIS — M25559 Pain in unspecified hip: Secondary | ICD-10-CM

## 2013-12-21 DIAGNOSIS — Z23 Encounter for immunization: Secondary | ICD-10-CM

## 2013-12-21 DIAGNOSIS — I10 Essential (primary) hypertension: Secondary | ICD-10-CM

## 2013-12-21 MED ORDER — ACETAMINOPHEN ER 650 MG PO TBCR: 1300.0000 mg | EXTENDED_RELEASE_TABLET | Freq: Three times a day (TID) | ORAL | Status: DC | PRN

## 2013-12-21 MED ORDER — LOSARTAN POTASSIUM 100 MG PO TABS: 100.0000 mg | ORAL_TABLET | Freq: Every day | ORAL | Status: DC

## 2013-12-21 MED ORDER — AMLODIPINE BESYLATE 10 MG PO TABS: 5.0000 mg | ORAL_TABLET | Freq: Every day | ORAL | Status: DC

## 2013-12-21 NOTE — Assessment & Plan Note (Signed)
Patient presents with 2-3 weeks of low back pain/left leg/left knee pain. Exam normal except for left paraspinal tenderness and left lateral leg tenderness. No red flag signs/symptoms. -attempt trial of increased Tylenol (650 mg CR TID) -increase Gabapentin to 600 TID -apply heat/ice to affected area -discussed with patient that if conservative therapy fails will obtain imaging of left hip and knee

## 2013-12-21 NOTE — Progress Notes (Signed)
   Subjective:    Patient ID: Laura Griffin, female    DOB: 27-Nov-1945, 68 y.o.   MRN: [id number]  HPI 68 y/o female presents for routine follow up.  Hypertension - currently on Norvasc, Toprol, and Losartan, taking as prescribed, no side effects, no chest pain, no vision changes, no headaches  Left lumbar/hip/leg pain - previous history of lumber DDD, patient has had pain for 2-3 weeks, no inciting injury, has been taking Tylenol 500 mg TID with minimal relief, has attempted heat to low back which provided some relief, no bladder/bowel incontinence, no associated leg weakness/numbness  Sciatica/peripheral neuropathy - taking Gabapentin 300 in the AM, and 600 at lunch and night, reports burning sensation in left anterior shin that is worse with above symptoms  Social - lives with husband, not a smoker  Review of Systems  Constitutional: Negative for fever, chills and fatigue.  Respiratory: Negative for cough and shortness of breath.   Cardiovascular: Negative for chest pain and leg swelling.  Gastrointestinal: Negative for nausea and diarrhea.       Objective:   Physical Exam Vitals: reviewed Gen: pleasant AAF, NAD Cardiac: RRR, S1 and S2 present, no murmurs, no heaves/thrills Resp: CTAB, normal effort MSK: left lumber tenderness, no midline lumber tenderness; left hip - no pain with log roll/FABER/FADIR, no left greater trochanter tenderness; left knee - no crepitus, no swelling, mild medial joint tenderness, ligament testing intact (lachman, varus and valgus stress testing), McMurray's negative Skin: no bruises or rash Neuro: strength 5/5 in bilateral lower extremity, sensation to light touch intact, SLT negative bilaterally  Lumber MRI 2013 - multilevel DDD No previous left hip or knee imaging     Assessment & Plan:  Please see problem specific assessment and plan.

## 2013-12-21 NOTE — Patient Instructions (Addendum)
High blood pressure - well controlled today, decrease Amlodipine to 5 mg daily (take 1/2 of 10 mg tablet), no change to Losartan or Metoprolol today  Left back/hip/knee pain - start Tylenol 650 CR three times daily, increase Gabapentin to 600 three times daily, apply heat/ice to affected areas, continue home exercises  If no improvement in 2 weeks please return for follow up.

## 2013-12-21 NOTE — Assessment & Plan Note (Signed)
Well controlled. Blood pressure low normal with no side effects. -decrease Norvasc to 5 mg daily.

## 2013-12-24 ENCOUNTER — Other Ambulatory Visit: Payer: Self-pay | Admitting: Family Medicine

## 2014-01-13 DIAGNOSIS — H4011X2 Primary open-angle glaucoma, moderate stage: Secondary | ICD-10-CM | POA: Diagnosis not present

## 2014-01-15 ENCOUNTER — Ambulatory Visit (INDEPENDENT_AMBULATORY_CARE_PROVIDER_SITE_OTHER): Payer: Medicare Other | Admitting: Family Medicine

## 2014-01-15 ENCOUNTER — Encounter: Payer: Self-pay | Admitting: Family Medicine

## 2014-01-15 VITALS — BP 112/75 | HR 58 | Temp 97.7°F | Ht 64.0 in | Wt 183.6 lb

## 2014-01-15 DIAGNOSIS — L84 Corns and callosities: Secondary | ICD-10-CM

## 2014-01-15 LAB — COMPREHENSIVE METABOLIC PANEL
ALT: 30 U/L (ref 0–35)
AST: 22 U/L (ref 0–37)
Albumin: 4.4 g/dL (ref 3.5–5.2)
Alkaline Phosphatase: 73 U/L (ref 39–117)
BUN: 16 mg/dL (ref 6–23)
CALCIUM: 10.4 mg/dL (ref 8.4–10.5)
CO2: 26 meq/L (ref 19–32)
CREATININE: 0.91 mg/dL (ref 0.50–1.10)
Chloride: 107 mEq/L (ref 96–112)
Glucose, Bld: 92 mg/dL (ref 70–99)
Potassium: 4.1 mEq/L (ref 3.5–5.3)
Sodium: 143 mEq/L (ref 135–145)
Total Bilirubin: 0.8 mg/dL (ref 0.2–1.2)
Total Protein: 7.3 g/dL (ref 6.0–8.3)

## 2014-01-15 MED ORDER — AMLODIPINE BESYLATE 5 MG PO TABS: 5.0000 mg | ORAL_TABLET | Freq: Every day | ORAL | Status: DC

## 2014-01-15 MED ORDER — GABAPENTIN 800 MG PO TABS: 800.0000 mg | ORAL_TABLET | Freq: Three times a day (TID) | ORAL | Status: DC

## 2014-01-15 NOTE — Progress Notes (Signed)
   Subjective:    Patient ID: Laura Griffin, female    DOB: 08-09-1945, 68 y.o.   MRN: [id number]  HPI  HTN: toprol, losartan, norvasc decreased from 10 to 5mg  at last visit.  Review of Systems     Objective:   Physical Exam BP 112/75  Pulse 58  Temp(Src) 97.7 F (36.5 C) (Oral)  Ht 5\' 4"  (1.626 m)  Wt 183 lb 9.6 oz (83.28 kg)  BMI 31.50 kg/m2  LMP 04/02/1996        Assessment & Plan:   Gabapentin: increase to 800mg  TID - GFR (MDRD) 73

## 2014-01-15 NOTE — Patient Instructions (Signed)
New prescriptions and medication changes:  Gabapentin: I increased this from 600mg  to 800mg  three times a day  High Blood Pressure: your blood pressure is very well controlled, I am decreasing your Norvasc (or Amlodipine) from 10mg  to 5mg  once a day.  Dr. Randolm IdolFletke wanted you to do this as well.

## 2014-01-26 ENCOUNTER — Ambulatory Visit (INDEPENDENT_AMBULATORY_CARE_PROVIDER_SITE_OTHER): Payer: Medicare Other | Admitting: Podiatry

## 2014-01-26 ENCOUNTER — Encounter: Payer: Self-pay | Admitting: Podiatry

## 2014-01-26 ENCOUNTER — Ambulatory Visit (INDEPENDENT_AMBULATORY_CARE_PROVIDER_SITE_OTHER): Payer: Medicare Other

## 2014-01-26 VITALS — BP 112/75 | HR 58 | Resp 16 | Ht 65.0 in | Wt 183.0 lb

## 2014-01-26 DIAGNOSIS — B351 Tinea unguium: Secondary | ICD-10-CM

## 2014-01-26 DIAGNOSIS — M2040 Other hammer toe(s) (acquired), unspecified foot: Secondary | ICD-10-CM | POA: Diagnosis not present

## 2014-01-26 DIAGNOSIS — M2010 Hallux valgus (acquired), unspecified foot: Secondary | ICD-10-CM | POA: Diagnosis not present

## 2014-01-26 DIAGNOSIS — M779 Enthesopathy, unspecified: Secondary | ICD-10-CM

## 2014-01-26 DIAGNOSIS — M778 Other enthesopathies, not elsewhere classified: Secondary | ICD-10-CM

## 2014-01-26 DIAGNOSIS — M79609 Pain in unspecified limb: Secondary | ICD-10-CM | POA: Diagnosis not present

## 2014-01-26 DIAGNOSIS — G5791 Unspecified mononeuropathy of right lower limb: Secondary | ICD-10-CM | POA: Diagnosis not present

## 2014-01-26 DIAGNOSIS — M7750 Other enthesopathy of unspecified foot: Secondary | ICD-10-CM

## 2014-01-26 NOTE — Progress Notes (Signed)
   Subjective:    Patient ID: Laura Griffin, female    DOB: 19-Sep-1945, 68 y.o.   MRN: [id number]  HPI Comments: "I have pain in the toes"  Patient c/o aching 1st MPJ bilateral for several years. She has some swelling and redness. Shoes are uncomfortable. Right over left. She also has hammer toes. Callused area 4th toe left.  Foot Pain Associated symptoms include arthralgias.      Review of Systems  HENT: Positive for trouble swallowing.   Eyes: Positive for redness.  Genitourinary: Positive for urgency.  Musculoskeletal: Positive for arthralgias and back pain.  All other systems reviewed and are negative.      Objective:   Physical Exam: I have reviewed her past medical history medications allergies surgery social history and review of systems. Pulses are strongly palpable bilateral. Neurologic sensorium is intact versus lusty monofilament. Deep tendon reflexes are intact bilateral muscle strength +5 over 5 dorsiflexion plantar flexors and inverters and everters all intrinsic musculature is intact. Orthopedic evaluation demonstrates hallux abductovalgus deformities bilateral resulting and reactive hyperkeratoses and neuritis of the dorsal medial cutaneous nerve first metatarsophalangeal joint of the right foot. Cutaneous evaluation of Mr. supple well-hydrated cutis nails are thick yellow dystrophic onychomycotic and painful palpation. Radiographic evaluation did instruct all joints distal to the ankle for range of motion however she does have severe hallux abductovalgus deformity with mild osteopenia.        Assessment & Plan:  Assessment: Hallux abductovalgus deformity with capsulitis of the first metatarsophalangeal joint. Neuritis of the dorsomedial cutaneous nerve right foot. Pain in limb secondary to onychomycosis 1 through 5 bilateral.  Plan: Debridement of nails 1 through 5 bilateral covered service secondary to pain. Discussed etiology pathology conservative versus  surgical therapies. Injected the first metatarsophalangeal joint with dexamethasone and local anesthetic after sterile Betadine skin prep. I'll follow-up with her in the near future for surgical intervention if necessary.

## 2014-02-01 ENCOUNTER — Encounter: Payer: Self-pay | Admitting: Podiatry

## 2014-02-15 ENCOUNTER — Ambulatory Visit (INDEPENDENT_AMBULATORY_CARE_PROVIDER_SITE_OTHER): Payer: Medicare Other | Admitting: Family Medicine

## 2014-02-15 ENCOUNTER — Encounter: Payer: Self-pay | Admitting: Family Medicine

## 2014-02-15 VITALS — BP 134/78 | HR 52 | Temp 97.8°F | Ht 64.0 in | Wt 183.0 lb

## 2014-02-15 DIAGNOSIS — M5442 Lumbago with sciatica, left side: Secondary | ICD-10-CM | POA: Diagnosis not present

## 2014-02-15 DIAGNOSIS — I10 Essential (primary) hypertension: Secondary | ICD-10-CM | POA: Diagnosis not present

## 2014-02-15 DIAGNOSIS — R1310 Dysphagia, unspecified: Secondary | ICD-10-CM

## 2014-02-15 NOTE — Progress Notes (Signed)
   Subjective:    Patient ID: Laura CaddyIrene C Griffin, female    DOB: 01-Oct-1945, 68 y.o.   MRN: [id number]  HPI 68 y/o female presents for routine follow up.  Left leg pain - related to lumbar DDD, improved with Gabapentin 800 TID, only has episodes of pain 1-2 times daily, last 10 minutes, resolve spontaneously, reports burning sensation over lateral knee/lower leg, mostly occurs when laying down, no other factors aleve or aggravate the pain  Hypotension - only taking Norvasc 5 mg daily (previously 10 mg), continues to take Losartan and Toprol, no lightheadedness, no CP, no vision changes  Difficulty swallowing - patient continues to have difficulty swallowing solids, does not occur with every meal, also has feeling of globus  Social - lives with husband  Review of Systems  Constitutional: Negative for fever, chills and fatigue.  Respiratory: Negative for cough and shortness of breath.   Gastrointestinal: Negative for nausea, vomiting and diarrhea.       Objective:   Physical Exam Vitals: reviewed Gen: pleasant AAF, NAD Cardiac: RRR, S1 and S2 present, no murmurs Resp: CTAB, normal effort  Reviewed MRI results from 2013    Assessment & Plan:  Please see problem specific assessment and plan.

## 2014-02-15 NOTE — Assessment & Plan Note (Signed)
Patient continues to have difficulty swallowing -patient told to return to GI for likely EGD

## 2014-02-15 NOTE — Assessment & Plan Note (Signed)
BP in more acceptable range with decreased dose of Norvasc. -continue current regimen

## 2014-02-15 NOTE — Patient Instructions (Signed)
Leg pain - I am happy to hear that it is improved, continue tylenol and gabapentin  High blood pressure - well controlled, call your pharmacy to see if you have refills  Difficulty swallowing - please call you GI physician and set up a follow up appointment, I am concerned that you may need to EGD (scope)

## 2014-02-15 NOTE — Assessment & Plan Note (Signed)
Radicular symptoms improved with scheduled Tylenol and Gabapentin 800 TID -no change in therapy

## 2014-04-06 DIAGNOSIS — K219 Gastro-esophageal reflux disease without esophagitis: Secondary | ICD-10-CM | POA: Diagnosis not present

## 2014-04-06 DIAGNOSIS — J387 Other diseases of larynx: Secondary | ICD-10-CM | POA: Diagnosis not present

## 2014-05-16 ENCOUNTER — Other Ambulatory Visit: Payer: Self-pay | Admitting: Family Medicine

## 2014-05-19 ENCOUNTER — Other Ambulatory Visit: Payer: Self-pay | Admitting: Family Medicine

## 2014-05-19 MED ORDER — GABAPENTIN 800 MG PO TABS
800.0000 mg | ORAL_TABLET | Freq: Three times a day (TID) | ORAL | Status: DC
Start: 2014-05-19 — End: 2014-09-20

## 2014-05-19 MED ORDER — AMLODIPINE BESYLATE 5 MG PO TABS
5.0000 mg | ORAL_TABLET | Freq: Every day | ORAL | Status: DC
Start: 2014-05-19 — End: 2014-07-19

## 2014-05-19 NOTE — Telephone Encounter (Signed)
Please see phone message from pt today.  Clovis PuMartin, Zackariah Vanderpol L, RN

## 2014-05-19 NOTE — Telephone Encounter (Signed)
Pt called and said that the pharmacy sent for refills for pt's gabapentin and amlodipine and the pharmacy has not received any authorization for refills. Pt is currently out of both medications and needs refills / sr

## 2014-05-19 NOTE — Telephone Encounter (Signed)
Pt informed. Laura Griffin, CMA  

## 2014-05-19 NOTE — Telephone Encounter (Signed)
Note to nursing staff - please call patient and inform her that medications have been sent to pharmacy

## 2014-06-02 DIAGNOSIS — L309 Dermatitis, unspecified: Secondary | ICD-10-CM | POA: Diagnosis not present

## 2014-06-02 DIAGNOSIS — K219 Gastro-esophageal reflux disease without esophagitis: Secondary | ICD-10-CM | POA: Diagnosis not present

## 2014-06-02 DIAGNOSIS — K625 Hemorrhage of anus and rectum: Secondary | ICD-10-CM | POA: Diagnosis not present

## 2014-06-02 DIAGNOSIS — J387 Other diseases of larynx: Secondary | ICD-10-CM | POA: Diagnosis not present

## 2014-06-02 DIAGNOSIS — R05 Cough: Secondary | ICD-10-CM | POA: Diagnosis not present

## 2014-06-09 DIAGNOSIS — K625 Hemorrhage of anus and rectum: Secondary | ICD-10-CM | POA: Diagnosis not present

## 2014-07-19 ENCOUNTER — Encounter: Payer: Self-pay | Admitting: Family Medicine

## 2014-07-19 ENCOUNTER — Ambulatory Visit (INDEPENDENT_AMBULATORY_CARE_PROVIDER_SITE_OTHER): Payer: Medicare Other | Admitting: Family Medicine

## 2014-07-19 VITALS — BP 164/76 | HR 59 | Temp 97.5°F | Ht 64.0 in | Wt 186.3 lb

## 2014-07-19 DIAGNOSIS — R609 Edema, unspecified: Secondary | ICD-10-CM

## 2014-07-19 DIAGNOSIS — R1310 Dysphagia, unspecified: Secondary | ICD-10-CM

## 2014-07-19 DIAGNOSIS — I10 Essential (primary) hypertension: Secondary | ICD-10-CM

## 2014-07-19 DIAGNOSIS — K219 Gastro-esophageal reflux disease without esophagitis: Secondary | ICD-10-CM

## 2014-07-19 DIAGNOSIS — J309 Allergic rhinitis, unspecified: Secondary | ICD-10-CM | POA: Insufficient documentation

## 2014-07-19 DIAGNOSIS — M1711 Unilateral primary osteoarthritis, right knee: Secondary | ICD-10-CM

## 2014-07-19 HISTORY — DX: Gastro-esophageal reflux disease without esophagitis: K21.9

## 2014-07-19 HISTORY — DX: Edema, unspecified: R60.9

## 2014-07-19 HISTORY — DX: Allergic rhinitis, unspecified: J30.9

## 2014-07-19 MED ORDER — CETIRIZINE HCL 10 MG PO TABS: 10.0000 mg | ORAL_TABLET | Freq: Every day | ORAL | Status: DC

## 2014-07-19 MED ORDER — HYDROCHLOROTHIAZIDE 12.5 MG PO CAPS: 12.5000 mg | ORAL_CAPSULE | Freq: Every day | ORAL | Status: DC

## 2014-07-19 NOTE — Assessment & Plan Note (Addendum)
Bilateral 1+ pitting edema most likely due to Norvasc. Heart failure unlikely since patient denies dyspnea on exertion, orthopnea, and PND. No lung crackles on lung ausculation. Likely causing the burning sensation the patient reports in lateral calves.   - stop Norvasc, initiate HCTZ for HTN control  Laura Griffin, MS3  Agree with medical student assessment and plan. Recent CMP showed normal liver and rena functions. No other signs/symtpoms of cardiac etiology. Suspect edema is related to Norvasc. Donnella ShamKyle Fletke MD

## 2014-07-19 NOTE — Assessment & Plan Note (Addendum)
Symptoms of burning substernal chest pain and reflux after meals have diminished since initiation of Protonix.  - Continue Protonix twice per day 30 minutes before meals. Patient will call back to verify dose [facility].  Laura Griffin, MS3  Agree with MS3 assessment and plan. Donnella ShamKyle Fletke MD

## 2014-07-19 NOTE — Patient Instructions (Signed)
Hypertension - continue losartan, stop Norvasc, start HCTZ 12.5 mg daily  Leg swelling - likely causing the leg burning sensation, stop the Norvasc which may be causing the leg swelling  Allergies/cough - start Zyrtec daily  Trouble swallowing - if no improvement with allergy medications follow up with GI  Right knee pain - please go to the hospital to have an xray completed of your knees

## 2014-07-19 NOTE — Assessment & Plan Note (Addendum)
Continued discomfort in the oropharynx on swallowing with no change in symptoms after starting of Protonix before meals. Possibly due to hyperplasia of lymphoid tissue in the posterior pharynx due to allergic rhinitis. Esophageal etiologies are also on the differential including achalasia, esophageal web, and Zenker's diverticulum due to prolonged dysphagia with solids.   - Start cetirizine daily and monitor symptoms - Follow-up with GI if symptoms are not relieved for a possible EGD  Laura Griffin, MS3  Agree with medical student assessment and plan. If no improvement of symptoms with treatment for GERD and Allergic Rhinitis patient will need EGD to evaluate for esophogeal etiology.  Donnella ShamKyle Fletke MD

## 2014-07-19 NOTE — Assessment & Plan Note (Addendum)
Due to exacerbation of symptoms of cough, postnasal drip, dry eyes, and ear pain in the past month, allergic rhinitis is most likely. Sinusitis is possible due to presence of nasal congestion, but less likely due to lack of facial tenderness and headache.   - Continue Travatan for eye symptoms - Cetirizine 10mg  once daily  Alfonse FlavorsLouisa Rhyatt Muska, MS3  Agree with medical student assessment plan. Initiate Zyrtec in addition to Flonase. Donnella ShamKyle Fletke MD

## 2014-07-19 NOTE — Assessment & Plan Note (Addendum)
BP elevated to 164/76 today. Due to bilateral 1+ pitting edema of the calves, stopping Norvasc and initiating HCTZ. Continuing metoprolol and losartan. Patient has a history of hypercalcemia so will check BMP in 2 weeks.   Laura Griffin, MS3  Agree with MS3 documentation. Stop Norvasc as likely causing leg swelling/pain. Initiate HCTZ 12.5 mg daily. Donnella ShamKyle Fletke MD

## 2014-07-19 NOTE — Assessment & Plan Note (Addendum)
Intensified pain right knee in recent months limits ambulation. X-ray in 2007 showed degenerative changes.   - continue Tylenol regimen for pain - AP, lateral, and sunrise X-ray  Laura Griffin, MS3  Agree with medical student assessment and plan. Consider steroid injection vs referral to orthopedics depending on the severity of the OA. Donnella ShamKyle Fletke MD

## 2014-07-19 NOTE — Progress Notes (Signed)
Subjective:     Patient ID: Laura Griffin, female   DOB: Mar 23, 1946, 69 y.o.   MRN: [id number]  Written by: Alfonse Flavors, MS3 HPI Laura Griffin is a 69 year old female with a past medical history significant for HTN, sciatica, and GERD who presents today for follow-up.  GERD: Patient was seen by [facility] in January and diagnosed with GERD. She is taking Protonix twice daily 30 minutes before meals and reports alleviation of burning symptoms and reflux.   Dysphagia: Patient reports continued dysphagia with meat and large pills (i.e. Gabapentin). The GI physicians did not perform an EGD. She feels food getting stuck in her oropharynx, which can be dislodged by drinking water. Chewing food more thoroughly and lowering her head while swallowing partially prevents the discomfort.   Cough: She notes a cough that has gotten worse in the last month with addition of wheezing and shortness of breath. The symptoms are episodic and present for 30 minutes 2-3 times a week. Symptoms are not related to eating. She reports that her eyes are itchy and dry due to pollen. She uses Travatan eye drops once per day which provides relief. She has not tried any medications for the cough. Nothing makes the symptoms better or worse. She is not awoken at night due to the symptoms.   Right knee pain: Known osteoarthritis in the right knee (last X-ray 2007) with increased pain with use that limits movement. Occasionally uses a cane. She does report occasional locking and clicking of right knee  Leg pain: Patient reports a 'burning' sensation bilaterally that radiates from her hip to ankles bilaterally. The pain is worst down the lateral aspect of her calves bilaterally. She experiences these symptoms 3-4 times a week and more often at night when she lies down on either side. The sensation is partially alleviated when she lies flat on her back. Propping up her legs with a pillow also relieves the pain. No  associated chest pain/sob/orthopnea/PND. No numbness in her toes, no associated weakness.   HTN: Patient is taking  Norvasc,  Losartan, and 100 mg metroprolol daily. No chest pain/headahe/vision changes.    Review of Systems Please see HPI.      Objective:   Physical Exam BP 164/76 mmHg  Pulse 59  Temp(Src) 97.5 F (36.4 C) (Oral)  Ht  (1.626 m)  Wt 186 lb 4.8 oz (84.505 kg)  BMI 31.96 kg/m2  LMP 04/02/1996 General: well-appearing in NAD, AAF HEENT: clear tympanic membranes bilaterally. No submandibular, cervical, or supraclavicular LAD. PERRL, EOMI, nasal septum midline CV: RRR, no MRG, nl S1 and S2, 1+ pitting edema up to midshin bilaterally Pulmonary: CTAB, no crackles or wheezes, normal work of effort MSK: 5/5 strength in lower extremities, full ROM of knees bilaterally. Normal sensation of lower extremities. Mild tenderness to palpation of lateral aspect of right knee. Anterior drawer, posterior drawer, valgus stress, varus stress, and McMurray's test did not elicit abnormal findings in bilaterally legs. Straight leg raise did not incite sciatic pain on either side. Full passive ROM with no pain of hip joints bilaterally.  Neuro: strength of bilateral LE 5/5, 2+ patellar and achilles reflexes, no clonus, sensation to light touch grossly intact    Assessment:     Please see Problem List.    Plan:     Please see Problem List.     I personally saw and evaluated the patient. I have reviewed the medical student note and agree with the documentation. I personally completed the  physical exam. Additions to the medical student note are made in blue.   Donnella ShamKyle Fletke MD

## 2014-07-20 ENCOUNTER — Ambulatory Visit (HOSPITAL_COMMUNITY)
Admission: RE | Admit: 2014-07-20 | Discharge: 2014-07-20 | Disposition: A | Discharge status: Home / Self Care | Payer: Medicare Other | Source: Ambulatory Visit | Attending: Family Medicine | Admitting: Family Medicine

## 2014-07-20 ENCOUNTER — Other Ambulatory Visit: Payer: Self-pay | Admitting: Family Medicine

## 2014-07-20 DIAGNOSIS — M25562 Pain in left knee: Secondary | ICD-10-CM

## 2014-07-20 DIAGNOSIS — M25561 Pain in right knee: Secondary | ICD-10-CM

## 2014-07-20 DIAGNOSIS — M1711 Unilateral primary osteoarthritis, right knee: Secondary | ICD-10-CM

## 2014-07-20 DIAGNOSIS — M1712 Unilateral primary osteoarthritis, left knee: Secondary | ICD-10-CM | POA: Diagnosis not present

## 2014-07-20 DIAGNOSIS — M170 Bilateral primary osteoarthritis of knee: Secondary | ICD-10-CM | POA: Diagnosis not present

## 2014-07-20 NOTE — Addendum Note (Signed)
Addended by: Uvaldo RisingFLETKE, Chauntelle Azpeitia J on: 07/20/2014 11:44 AM   Modules accepted: Orders

## 2014-07-21 ENCOUNTER — Telehealth: Payer: Self-pay | Admitting: Family Medicine

## 2014-07-21 NOTE — Telephone Encounter (Signed)
Attempted to call patient with xray results. No answer. Left message that I will attempt to call again tomorrow.

## 2014-07-22 DIAGNOSIS — Z803 Family history of malignant neoplasm of breast: Secondary | ICD-10-CM | POA: Diagnosis not present

## 2014-07-22 DIAGNOSIS — Z1231 Encounter for screening mammogram for malignant neoplasm of breast: Secondary | ICD-10-CM | POA: Diagnosis not present

## 2014-07-22 NOTE — Telephone Encounter (Signed)
Discussed xray results. Specifically severe arthritis of right knee. Gave option of steroid injection vs referral to Ortho. Patient elected for steroid injection. She will call the office to schedule follow up.

## 2014-07-27 ENCOUNTER — Encounter: Payer: Self-pay | Admitting: Family Medicine

## 2014-07-27 ENCOUNTER — Ambulatory Visit (INDEPENDENT_AMBULATORY_CARE_PROVIDER_SITE_OTHER): Payer: Medicare Other | Admitting: Family Medicine

## 2014-07-27 VITALS — BP 114/45 | HR 59 | Temp 97.9°F | Ht 64.0 in | Wt 182.6 lb

## 2014-07-27 DIAGNOSIS — M1711 Unilateral primary osteoarthritis, right knee: Secondary | ICD-10-CM

## 2014-07-27 DIAGNOSIS — M25561 Pain in right knee: Secondary | ICD-10-CM | POA: Diagnosis present

## 2014-07-27 MED ORDER — METHYLPREDNISOLONE ACETATE 40 MG/ML IJ SUSP
40.0000 mg | Freq: Once | INTRAMUSCULAR | Status: AC
  Administered 2014-07-27: 40 mg via INTRA_ARTICULAR

## 2014-07-27 NOTE — Patient Instructions (Signed)
Please let Dr. Randolm IdolFletke know if you do not have improvement of your pain.

## 2014-07-27 NOTE — Progress Notes (Signed)
   Subjective:    Patient ID: Laura Griffin, female    DOB: Feb 22, 1946, 69 y.o.   MRN: [id number]  HPI 69 y/o female presents for steroid injection of the right knee.    Review of Systems     Objective:   Physical Exam  Knee Injection: Written and verbal consent was obtained after discussing the risks and benefits of the procedure with the patient. The anterior knee was cleansed in a sterile fashion with betadine. 40 mg Depo-medrol and 3 cc 1% Lidocaine was injected using an anterolateral approach using a 5 cc syringe and 25 gauge 11/2 in needle. No complications were encountered. Minimal blood loss. A band aid was applied.      Assessment & Plan:  Please see problem specific assessment and plan.

## 2014-07-27 NOTE — Assessment & Plan Note (Signed)
Patient presents for steroid injection of right knee. Patient tolerated procedure well. See procedure note.

## 2014-07-31 ENCOUNTER — Other Ambulatory Visit: Payer: Self-pay | Admitting: Family Medicine

## 2014-08-02 ENCOUNTER — Other Ambulatory Visit: Payer: Medicare Other

## 2014-08-02 DIAGNOSIS — I10 Essential (primary) hypertension: Secondary | ICD-10-CM | POA: Diagnosis not present

## 2014-08-02 LAB — BASIC METABOLIC PANEL
BUN: 17 mg/dL (ref 6–23)
CO2: 30 mEq/L (ref 19–32)
Calcium: 10.4 mg/dL (ref 8.4–10.5)
Chloride: 103 mEq/L (ref 96–112)
Creat: 0.91 mg/dL (ref 0.50–1.10)
Glucose, Bld: 87 mg/dL (ref 70–99)
POTASSIUM: 3.8 meq/L (ref 3.5–5.3)
Sodium: 139 mEq/L (ref 135–145)

## 2014-08-02 NOTE — Progress Notes (Signed)
BMP DONE TODAY Laura Griffin 

## 2014-08-05 ENCOUNTER — Encounter: Payer: Self-pay | Admitting: Family Medicine

## 2014-09-01 DIAGNOSIS — Z961 Presence of intraocular lens: Secondary | ICD-10-CM | POA: Diagnosis not present

## 2014-09-01 DIAGNOSIS — H4011X2 Primary open-angle glaucoma, moderate stage: Secondary | ICD-10-CM | POA: Diagnosis not present

## 2014-09-20 ENCOUNTER — Other Ambulatory Visit: Payer: Self-pay | Admitting: Family Medicine

## 2014-11-23 ENCOUNTER — Encounter: Payer: Self-pay | Admitting: Certified Nurse Midwife

## 2014-11-23 ENCOUNTER — Ambulatory Visit: Payer: Medicare Other | Admitting: Certified Nurse Midwife

## 2014-11-25 ENCOUNTER — Ambulatory Visit: Payer: No Typology Code available for payment source

## 2014-11-27 ENCOUNTER — Other Ambulatory Visit: Payer: Self-pay | Admitting: Family Medicine

## 2014-11-30 DIAGNOSIS — J387 Other diseases of larynx: Secondary | ICD-10-CM | POA: Diagnosis not present

## 2014-11-30 DIAGNOSIS — R05 Cough: Secondary | ICD-10-CM | POA: Diagnosis not present

## 2014-12-02 ENCOUNTER — Encounter: Payer: Self-pay | Admitting: Certified Nurse Midwife

## 2014-12-02 ENCOUNTER — Ambulatory Visit (INDEPENDENT_AMBULATORY_CARE_PROVIDER_SITE_OTHER): Payer: Medicare Other | Admitting: Certified Nurse Midwife

## 2014-12-02 VITALS — BP 110/72 | HR 72 | Resp 16 | Ht 64.25 in | Wt 188.0 lb

## 2014-12-02 DIAGNOSIS — Z124 Encounter for screening for malignant neoplasm of cervix: Secondary | ICD-10-CM

## 2014-12-02 DIAGNOSIS — Z01419 Encounter for gynecological examination (general) (routine) without abnormal findings: Secondary | ICD-10-CM | POA: Diagnosis not present

## 2014-12-02 NOTE — Progress Notes (Signed)
69 y.o. G1P1 Married  African American Fe here for annual exam. Menopausal no HRT. Denies vaginal bleeding or vaginal dryness. Had episode of yeast dermatitis under abdomen area, resolving now. Occurs in summer only. Patient being follow for glaucoma every 6 months with MD. Luberta Robertson PCP for aex/labs and medication management for hypertension. No health changes this year. No health concerns.  [name] last menstrual period was 04/02/1996.          Sexually active: No.  The current method of family planning is tubal ligation.    Exercising: No.  exercise Smoker:  no  Health Maintenance: Pap: 08-08-10 neg MMG:  07-22-14 category b density,birads 2:neg Colonoscopy:  2016 normal no repeats BMD:   2011 TDaP:  2008 Labs: pcp Self breast exam: done occ   reports that she quit smoking about 27 years ago. Her smoking use included Cigarettes. She has a 7.5 pack-year smoking history. She quit smokeless tobacco use about 18 years ago. She reports that she does not drink alcohol or use illicit drugs.  Past Medical History  Diagnosis Date  . Left sided sciatica 8/95,11/00    CT confirmed   . Bursitis of shoulder, right 09/19/1999  . Arthritis of knee, right 03/11/2006  . Diverticulosis   . Glaucoma   . Osteoarthritis   . Mitral valve prolapse     per patient heart murmur  . Hypertension     Past Surgical History  Procedure Laterality Date  . Tubal ligation  1973  . Vulva /perineum biopsy  2010    for hypopigmentation, non-specific result  . Cataract extraction  02/2011    left eye    Current Outpatient Prescriptions  Medication Sig Dispense Refill  . acetaminophen (TYLENOL) 650 MG CR tablet Take 2 tablets (1,300 mg total) by mouth every 8 (eight) hours as needed. 90 tablet 1  . aspirin (BAYER CHILDRENS ASPIRIN) 81 MG chewable tablet Chew 81 mg by mouth daily.      . brimonidine-timolol (COMBIGAN) 0.2-0.5 % ophthalmic solution Place 2 drops into both eyes every 12 (twelve) hours.      .  cetirizine (ZYRTEC) 10 MG tablet Take 1 tablet (10 mg total) by mouth daily. 90 tablet 1  . Cholecalciferol (VITAMIN D3) 1000 UNIT capsule Take 1,000 Units by mouth daily.      [name] gabapentin (NEURONTIN) 800 MG tablet take 1 tablet by mouth three times a day 270 tablet 1  . hydrochlorothiazide (MICROZIDE) 12.5 MG capsule Take 1 capsule (12.5 mg total) by mouth daily. 90 capsule 1  . losartan (COZAAR) 100 MG tablet take 1 tablet by mouth once daily 90 tablet 1  . metoprolol succinate (TOPROL-XL) 100 MG 24 hr tablet take 1 tablet by mouth once daily 90 tablet 1  . Multiple Vitamin (MULTIVITAMINS PO) Take 1 tablet by mouth daily.      . Omega-3 Fatty Acids (FISH OIL) 1000 MG CAPS Take 2 capsules by mouth daily.      . pantoprazole (PROTONIX) 40 MG tablet Take 40 mg by mouth 2 (two) times daily.    . travoprost, benzalkonium, (TRAVATAN Z) 0.004 % ophthalmic solution Place 1 drop into both eyes at bedtime.      No current facility-administered medications for this visit.    Family History  Problem Relation Age of Onset  . Cancer Sister 66    breast  . Stroke Father 48    died  . Hypertension Father   . Stroke Mother 108    cerebral hemorrhage  .  Hypertension Mother   . Breast cancer Sister     17  . Breast cancer Sister 83    ROS:  Pertinent items are noted in HPI.  Otherwise, a comprehensive ROS was negative.  Exam:   BP 110/72 mmHg  Pulse 72  Resp 16  Ht 5' 4.25" (1.632 m)  Wt 188 lb (85.276 kg)  BMI 32.02 kg/m2  LMP 04/02/1996 Height: 5' 4.25" (163.2 cm) Ht Readings from Last 3 Encounters:  12/02/14 5' 4.25" (1.632 m)  07/27/14  (1.626 m)  07/19/14  (1.626 m)    General appearance: alert, cooperative and appears stated age Head: Normocephalic, without obvious abnormality, atraumatic Neck: no adenopathy, supple, symmetrical, trachea midline and thyroid normal to inspection and palpation Lungs: clear to auscultation bilaterally Breasts: normal appearance, no  masses or tenderness, No nipple retraction or dimpling, No nipple discharge or bleeding, No axillary or supraclavicular adenopathy Heart: regular rate and rhythm Abdomen: soft, non-tender; no masses,  no organomegaly Extremities: extremities normal, atraumatic, no cyanosis or edema Skin: Skin color, texture, turgor normal. No rashes or lesions Lymph nodes: Cervical, supraclavicular, and axillary nodes normal. No abnormal inguinal nodes palpated Neurologic: Grossly normal   Pelvic: External genitalia:  no lesions              Urethra:  normal appearing urethra with no masses, tenderness or lesions              Bartholin's and Skene's: normal                 Vagina: normal appearing vagina with normal color and discharge, no lesions              Cervix: normal,non tender              Pap taken: No. Bimanual Exam:  Uterus:  normal size, contour, position, consistency, mobility, non-tender              Adnexa: normal adnexa and no mass, fullness, tenderness               Rectovaginal: Confirms               Anus:  normal sphincter tone, no lesions  Chaperone present: yes   A:  Well Woman with normal exam  Menopausal no HRT  Hypertension/ Glaucoma/ diverticulitis history under MD management  P:   Reviewed health and wellness pertinent to exam  Aware of need to evaluate if vaginal bleeding  Continue follow up as indicated  Pap smear as above not taken   counseled on breast self exam, mammography screening, feminine hygiene, adequate intake of calcium and vitamin D, diet and exercise  return annually or prn  An After Visit Summary was printed and given to the patient.

## 2014-12-02 NOTE — Patient Instructions (Signed)

## 2014-12-03 NOTE — Progress Notes (Signed)
Reviewed personally.  M. Suzanne Barbara Ahart, MD.  

## 2015-01-06 ENCOUNTER — Other Ambulatory Visit: Payer: Self-pay | Admitting: Family Medicine

## 2015-01-06 ENCOUNTER — Ambulatory Visit (INDEPENDENT_AMBULATORY_CARE_PROVIDER_SITE_OTHER): Payer: Medicare Other | Admitting: Family Medicine

## 2015-01-06 ENCOUNTER — Ambulatory Visit (HOSPITAL_COMMUNITY)
Admission: RE | Admit: 2015-01-06 | Discharge: 2015-01-06 | Disposition: A | Discharge status: Home / Self Care | Payer: Medicare Other | Source: Ambulatory Visit | Attending: Family Medicine | Admitting: Family Medicine

## 2015-01-06 VITALS — BP 160/88 | HR 65 | Temp 98.0°F | Ht 64.5 in | Wt 188.0 lb

## 2015-01-06 VITALS — BP 160/90 | HR 60 | Temp 97.7°F | Ht 64.0 in | Wt 187.0 lb

## 2015-01-06 DIAGNOSIS — R1310 Dysphagia, unspecified: Secondary | ICD-10-CM | POA: Diagnosis not present

## 2015-01-06 DIAGNOSIS — I10 Essential (primary) hypertension: Secondary | ICD-10-CM

## 2015-01-06 DIAGNOSIS — Z0000 Encounter for general adult medical examination without abnormal findings: Secondary | ICD-10-CM

## 2015-01-06 DIAGNOSIS — N3941 Urge incontinence: Secondary | ICD-10-CM

## 2015-01-06 DIAGNOSIS — Z1159 Encounter for screening for other viral diseases: Secondary | ICD-10-CM

## 2015-01-06 DIAGNOSIS — Z87891 Personal history of nicotine dependence: Secondary | ICD-10-CM | POA: Insufficient documentation

## 2015-01-06 DIAGNOSIS — R0602 Shortness of breath: Secondary | ICD-10-CM | POA: Insufficient documentation

## 2015-01-06 DIAGNOSIS — R05 Cough: Secondary | ICD-10-CM | POA: Diagnosis not present

## 2015-01-06 DIAGNOSIS — M62838 Other muscle spasm: Secondary | ICD-10-CM

## 2015-01-06 DIAGNOSIS — Z23 Encounter for immunization: Secondary | ICD-10-CM | POA: Diagnosis not present

## 2015-01-06 DIAGNOSIS — M6248 Contracture of muscle, other site: Secondary | ICD-10-CM

## 2015-01-06 DIAGNOSIS — R059 Cough, unspecified: Secondary | ICD-10-CM

## 2015-01-06 DIAGNOSIS — E669 Obesity, unspecified: Secondary | ICD-10-CM

## 2015-01-06 DIAGNOSIS — M5442 Lumbago with sciatica, left side: Secondary | ICD-10-CM | POA: Diagnosis not present

## 2015-01-06 NOTE — Progress Notes (Signed)
Subjective:   Laura Griffin is a 69 y.o. female who presents for an Initial Medicare Annual Wellness Visit.  Laura Griffin passed the MiniCog test.  Cardiac Risk Factors include: advanced age (>54men, >69 women);hypertension     Objective:    Today's Vitals   01/06/15 1350  BP: 179/72  Pulse: 65  Temp: 98 F (36.7 C)  TempSrc: Oral  Height: 5' 4.5" (1.638 m)  Weight: 188 lb (85.276 kg)  PainSc: 7   PainLoc: Neck    Current Medications (verified) Outpatient Encounter Prescriptions as of 01/06/2015  Medication Sig  . acetaminophen (TYLENOL) 650 MG CR tablet Take 2 tablets (1,300 mg total) by mouth every 8 (eight) hours as needed.  [name] aspirin (BAYER CHILDRENS ASPIRIN) 81 MG chewable tablet Chew 81 mg by mouth daily.    . brimonidine-timolol (COMBIGAN) 0.2-0.5 % ophthalmic solution Place 2 drops into both eyes every 12 (twelve) hours.    . cetirizine (ZYRTEC) 10 MG tablet Take 1 tablet (10 mg total) by mouth daily.  . Cholecalciferol (VITAMIN D3) 1000 UNIT capsule Take 1,000 Units by mouth daily.    [name] gabapentin (NEURONTIN) 800 MG tablet take 1 tablet by mouth three times a day  . hydrochlorothiazide (MICROZIDE) 12.5 MG capsule Take 1 capsule (12.5 mg total) by mouth daily.  [name] losartan (COZAAR) 100 MG tablet take 1 tablet by mouth once daily  . metoprolol succinate (TOPROL-XL) 100 MG 24 hr tablet take 1 tablet by mouth once daily  . Multiple Vitamin (MULTIVITAMINS PO) Take 1 tablet by mouth daily.    . Omega-3 Fatty Acids (FISH OIL) 1000 MG CAPS Take 2 capsules by mouth daily.    . pantoprazole (PROTONIX) 40 MG tablet Take 40 mg by mouth 2 (two) times daily.  . travoprost, benzalkonium, (TRAVATAN Z) 0.004 % ophthalmic solution Place 1 drop into both eyes at bedtime.    No facility-administered encounter medications on file as of 01/06/2015.    Allergies (verified) Sulfamethoxazole-trimethoprim and Hctz   History: Past Medical History  Diagnosis Date  . Left sided  sciatica 8/95,11/00    CT confirmed   . Bursitis of shoulder, right 09/19/1999  . Arthritis of knee, right 03/11/2006  . Diverticulosis   . Glaucoma   . Osteoarthritis   . Mitral valve prolapse     per patient heart murmur  . Hypertension    Past Surgical History  Procedure Laterality Date  . Tubal ligation  1973  . Vulva /perineum biopsy  2010    for hypopigmentation, non-specific result  . Cataract extraction  02/2011    left eye   Family History  Problem Relation Age of Onset  . Cancer Sister 67    breast  . Stroke Father 24    died  . Hypertension Father   . Stroke Mother 31    cerebral hemorrhage  . Hypertension Mother   . Breast cancer Sister     85  . Breast cancer Sister 20   Social History   Occupational History  . 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 Smoking status: Former Smoker -- 0.50 packs/day for 15 years    Types: Cigarettes    Quit date: 04/03/1987  . Smokeless tobacco: Former Neurosurgeon    Quit date: 06/04/1996  . Alcohol Use: No  . Drug Use: No  . Sexual Activity: No     Comment: BTSP    Tobacco Counseling Counseling given: Not  Answered   Activities of Daily Living In your present state of health, do you have any difficulty performing the following activities: 01/06/2015  Hearing? Y  Vision? Y  Difficulty concentrating or making decisions? N  Walking or climbing stairs? Y  Dressing or bathing? N  Doing errands, shopping? N  Preparing Food and eating ? N  Using the Toilet? N  Managing your Medications? N  Managing your Finances? N  Housekeeping or managing your Housekeeping? N    Immunizations and Health Maintenance Immunization History  Administered Date(s) Administered  . Influenza Split 02/20/2011, 01/16/2012  . Influenza Whole 01/17/2009, 01/10/2010  . Influenza,inj,Quad PF,36+ Mos 12/26/2012, 12/21/2013, 01/06/2015  . Pneumococcal Conjugate-13 07/09/2013  . Pneumococcal  Polysaccharide-23 07/13/2008  . Td 04/02/2004  . Tdap 04/02/2006  . Zoster 04/02/2012   Health Maintenance Due  Topic Date Due  . Hepatitis C Screening  [birth year]  . INFLUENZA VACCINE  11/01/2014    Patient Care Team: Uvaldo Rising, MD as PCP - General (Family Medicine) Marylynn Pearson (Dentistry) Althea Charon, MD (Radiology) Glenford Peers, OD (Optometry) Iona Hansen, DDS (Dentistry)  Indicate any recent Medical Services you may have received from other than [facility] providers in the past year (date may be approximate).     Assessment:   This is a routine wellness examination for Laura Griffin.   Hearing/Vision screen No exam data present  Dietary issues and exercise activities discussed: Current Exercise Habits:: The patient does not participate in regular exercise at present  Goals    . Blood Pressure < 140/90    . Eat more fruits and vegetables    . Exercise 3x per week (30 min per time)    . Weight < 200 lb (90.719 kg)      Depression Screen PHQ 2/9 Scores 01/06/2015 01/06/2015 07/27/2014 07/19/2014 02/15/2014 08/31/2013 07/09/2013  PHQ - 2 Score 0 0 0 0 0 0 0    Fall Risk Fall Risk  01/06/2015 01/06/2015 01/06/2015 02/15/2014 08/31/2013  Falls in the past year? - No No No No  Risk for fall due to : Impaired balance/gait - - Impaired balance/gait -    Cognitive Function: MMSE - Mini Mental State Exam 07/09/2013 10/23/2010  Orientation to time 5 5  Orientation to Place 5 5  Registration 3 3  Attention/ Calculation 5 5  Recall 3 3  Language- name 2 objects 2 2  Language- repeat 1 1  Language- follow 3 step command 3 3  Language- read & follow direction 1 1  Write a sentence 1 1  Copy design 1 1  Total score 30 30    Screening Tests Health Maintenance  Topic Date Due  . Hepatitis C Screening  Aug 12, 1945  . INFLUENZA VACCINE  11/01/2014  . MAMMOGRAM  07/22/2015  . TETANUS/TDAP  04/02/2016  . COLONOSCOPY  06/08/2024  . DEXA SCAN  Completed  . ZOSTAVAX   Completed  . PNA vac Low Risk Adult  Completed      Plan:     During the course of the visit, Laura Griffin was educated and counseled about the following appropriate screening and preventive services:   Vaccines to include Pneumoccal, Influenza, Hepatitis B, Td, Zostavax, HCV  Electrocardiogram  Cardiovascular disease screening  Colorectal cancer screening  Bone density screening  Diabetes screening  Glaucoma screening  Mammography/PAP  Nutrition counseling  Smoking cessation counseling  Patient Instructions (the written plan) were given to the patient.    MCDIARMID,TODD D, MD   01/06/2015  Subjective:   Laura Griffin is a 69 y.o. female who presents for Medicare Annual (Subsequent) preventive examination.  Patient passed MiniCog test  Cardiac Risk Factors include: advanced age (>30men, >67 women);hypertension     Objective:     Vitals: BP 160/88 mmHg  Pulse 65  Temp(Src) 98 F (36.7 C) (Oral)  Ht 5' 4.5" (1.638 m)  Wt 188 lb (85.276 kg)  BMI 31.78 kg/m2  LMP 04/02/1996  Tobacco History  Smoking status  . Former Smoker -- 0.50 packs/day for 15 years  . Types: Cigarettes  . Quit date: 04/03/1987  Smokeless tobacco  . Former Neurosurgeon  . Quit date: 06/04/1996       Past Medical History  Diagnosis Date  . Left sided sciatica 8/95,11/00    CT confirmed   . Bursitis of shoulder, right 09/19/1999  . Arthritis of knee, right 03/11/2006  . Diverticulosis   . Glaucoma   . Osteoarthritis   . Mitral valve prolapse     per patient heart murmur  . Hypertension    Past Surgical History  Procedure Laterality Date  . Tubal ligation  1973  . Vulva /perineum biopsy  2010    for hypopigmentation, non-specific result  . Cataract extraction  02/2011    left eye   Family History  Problem Relation Age of Onset  . Cancer Sister 81    breast  . Stroke Father 5    died  . Hypertension Father   . Stroke Mother 50    cerebral hemorrhage  .  Hypertension Mother   . Breast cancer Sister     21  . Breast cancer Sister 48   History  Sexual Activity  . Sexual Activity: No    Comment: BTSP    Outpatient Encounter Prescriptions as of 01/06/2015  Medication Sig  . acetaminophen (TYLENOL) 650 MG CR tablet Take 2 tablets (1,300 mg total) by mouth every 8 (eight) hours as needed. (Patient taking differently: Take 1,300 mg by mouth every 8 (eight) hours. )  . aspirin (BAYER CHILDRENS ASPIRIN) 81 MG chewable tablet Chew 81 mg by mouth daily.    . brimonidine-timolol (COMBIGAN) 0.2-0.5 % ophthalmic solution Place 2 drops into both eyes every 12 (twelve) hours.    . Cholecalciferol (VITAMIN D3) 1000 UNIT capsule Take 1,000 Units by mouth daily.    [name] gabapentin (NEURONTIN) 800 MG tablet take 1 tablet by mouth three times a day  . hydrochlorothiazide (MICROZIDE) 12.5 MG capsule Take 1 capsule (12.5 mg total) by mouth daily.  [name] losartan (COZAAR) 100 MG tablet take 1 tablet by mouth once daily  . metoprolol succinate (TOPROL-XL) 100 MG 24 hr tablet take 1 tablet by mouth once daily  . Multiple Vitamin (MULTIVITAMINS PO) Take 1 tablet by mouth daily.    . Omega-3 Fatty Acids (FISH OIL) 1000 MG CAPS Take 2 capsules by mouth daily.    . pantoprazole (PROTONIX) 40 MG tablet Take 40 mg by mouth 2 (two) times daily.  . travoprost, benzalkonium, (TRAVATAN Z) 0.004 % ophthalmic solution Place 1 drop into both eyes at bedtime.   . [DISCONTINUED] cetirizine (ZYRTEC) 10 MG tablet Take 1 tablet (10 mg total) by mouth daily.   No facility-administered encounter medications on file as of 01/06/2015.    Activities of Daily Living In your present state of health, do you have any difficulty performing the following activities: 01/06/2015  Hearing? Y  Vision? Y  Difficulty concentrating or making decisions? N  Walking or climbing  stairs? Y  Dressing or bathing? N  Doing errands, shopping? N  Preparing Food and eating ? N  Using the Toilet? N    Managing your Medications? N  Managing your Finances? N  Housekeeping or managing your Housekeeping? N    Patient Care Team: Uvaldo Rising, MD as PCP - General (Family Medicine) Marylynn Pearson (Dentistry) Althea Charon, MD (Radiology) Glenford Peers, OD (Optometry) Iona Hansen, DDS (Dentistry)    Assessment:     Exercise Activities and Dietary recommendations Current Exercise Habits:: The patient does not participate in regular exercise at present  Goals    . Blood Pressure < 140/90    . Eat more fruits and vegetables    . Exercise 3x per week (30 min per time)    . Weight < 200 lb (90.719 kg)      Fall Risk Fall Risk  01/06/2015 01/06/2015 01/06/2015 02/15/2014 08/31/2013  Falls in the past year? - No No No No  Risk for fall due to : Impaired balance/gait - - Impaired balance/gait -   Depression Screen PHQ 2/9 Scores 01/06/2015 01/06/2015 07/27/2014 07/19/2014  PHQ - 2 Score 0 0 0 0     Cognitive Testing MMSE - Mini Mental State Exam 01/06/2015 07/09/2013 10/23/2010  Not completed: (No Data) - -  Orientation to time - 5 5  Orientation to Place - 5 5  Registration - 3 3  Attention/ Calculation - 5 5  Recall - 3 3  Language- name 2 objects - 2 2  Language- repeat - 1 1  Language- follow 3 step command - 3 3  Language- read & follow direction - 1 1  Write a sentence - 1 1  Copy design - 1 1  Total score - 30 30    Immunization History  Administered Date(s) Administered  . Influenza Split 02/20/2011, 01/16/2012  . Influenza Whole 01/17/2009, 01/10/2010  . Influenza,inj,Quad PF,36+ Mos 12/26/2012, 12/21/2013, 01/06/2015  . Pneumococcal Conjugate-13 07/09/2013  . Pneumococcal Polysaccharide-23 07/13/2008  . Td 04/02/2004  . Tdap 04/02/2006  . Zoster 04/02/2012   Screening Tests Health Maintenance  Topic Date Due  . Hepatitis C Screening  [birth year]  . MAMMOGRAM  07/22/2015  . INFLUENZA VACCINE  11/01/2015  . TETANUS/TDAP  04/02/2016  . COLONOSCOPY   06/08/2024  . DEXA SCAN  Completed  . ZOSTAVAX  Completed  . PNA vac Low Risk Adult  Completed      Plan:   During the course of the visit the patient was educated and counseled about the following appropriate screening and preventive services:   Vaccines to include Pneumoccal, Influenza, Hepatitis B, Td, Zostavax, HCV  Electrocardiogram  Cardiovascular Disease  Colorectal cancer screening  Bone density screening  Diabetes screening  Glaucoma screening  Mammography/PAP  Nutrition counseling   Patient Instructions (the written plan) was given to the patient.   MCDIARMID,TODD D, MD  01/06/2015

## 2015-01-06 NOTE — Progress Notes (Signed)
Subjective:     Patient ID: Laura Griffin, female   DOB: 1946-01-27, 69 y.o.   MRN: [id number]  H&P written and performed by Bing Quarry Pisanie MS3 under supervision from Dr. Randolm Idol. Additions from Dr. Randolm Idol made in blue.  HPI 69 year old female presents for evaluation of right low back pain, right shoulder pain, and follow-up of dysphagia. PMHx significant for osteoarthritis, HTN, cervical radiculopathy right C6. Left sided Sciatica.   PAIN ON RIGHT SIDE HEAD TO TOE: Patient presents with new onset right lower back pain, new onset headache, newly aggrevated chronic neck pain, and stable chronic osteoarthritis pain in her knee and ankle. Her back pain has been going on for a week and had recently gotten worse over the past 2 days. Patient states that she was getting up from her chair and she noticed pain in her back. She denies any trauma. The pain subsided but returned later in the week and she rated it 10/10 yesterday night. The pain is worse at night but it does not awaken her from her sleep. She denies any radicular pain in her lower or upper extremities. Her neck pain started a couple of days before the back pain. It is limiting her neck ROM due to pain. She does not currently have a headache but it does come on with the neck pain. Her neck muscles feel tight to her. She has only tried her normal arthritis tylenol for the pain and a hot bath once which has helped. She denies, fevers, night sweats, chills, N/V/C/D, changes in vision, dizziness, new onset fecal / urinary incontinence, saddle anesthesia, chest pain, and SOB.   Right lumbar back pain - two-week history of lumbar back pain, patient denies injury, mild radiation of pain down the right buttock, no associated weakness, no numbness in her lower extremities, no fever, no chills, patient reports a history of urge incontinence however no change in symptoms, no saddle anesthesia, she has been taking when necessary Tylenol which provided  minimal relief of her symptoms, pain was relieved with an Epson salt bath  Right neck/shoulder pain - patient points to right trapezius muscle when describing pain, chronic in nature however exacerbated over the past few weeks, no current injury, pain is exacerbated with neck movement, Tylenol 3 times a day has not relieved her symptoms,  SWALLOWING ISSUE AND COUGH: Chronic issue, patient describes a indolent cough and intermittent dysphagia over the past year which she describes as a mucus ball she is not able to swallow. Patient states the PPI therapy she was prescribed by [facility] helped with reflux but her cough and swallowing issue has not improved. She would like to be further evaluated.  Dysphasia and cough - patient reports continued dysphasia, especially with medications, denies current dysphasia with foods including solids and liquids, she reports chronic cough that has not been relieved with proton pump inhibitor, previous GI note from August was reviewed in office which recommended chest x-ray for further pulmonary evaluation of cough, cough is nonproductive, no associated fevers, no associated shortness of breath  Hypertension-patient did not take her blood pressure medications this morning, no current chest pain, she reports mild frontal headaches over the past week, no current headache, no associated vision changes  Social-patient lives with her husband, previous smoker   Review of Systems Pertinent ROS as per HPI    Objective:   Physical Exam  Filed Vitals:   01/06/15 0933  BP: 160/90  Pulse:   Temp:    GEN: Well  appearing, NAD HEENT: PEERL, normocephalic, atraumatic, tenderness to palpation of right trapezius muscle. Limited neck ROM due to pain.  No radicular pain on neck flexion/extension/rotation. No palpable thyromegaly or lymphadenopathy.  CV: RRR, nl S1, S2 no m/r/g PULM: CTAB, NWOB, no c/w/r GI: s/nt/nd no organomegaly NEURO: CN 2-12 intact, normal reflexes  throughout, good strength and ROM in extremities, gait slow but no signs of balance issues or foot drop. Mild exotropia of right eye. Negative straight leg raise bilaterally  MSK: Tenderness to palpation of the right paraspinal musculature around the L2-4 level. No bony tenderness to palpation. No tenderness elsewhere on back. No overlying rash.      Assessment and Plan:    BACK and NECK MUSCLE STRAIN: No concern for infection, fracture/herniaiton or malignancy given no red flags, radicular symptoms, night time awakenings, or bony tenderness, and a benign history and ROS. Physical exam most suggestive of muscular pain.  - Extra tylenol at night or when pain is worst - Plenty of heat and stretching of neck and back. - Call if no improvement for 4-5 days, will try muscle relaxer.   SWALLOWING AND COUGH: unknown etiology, for cough will get CXR and consider referral to GI again.   HEALTH MAINTENANCE: Patient has not taken BP medication before visit and is currently in pain. This is likely the reason her BP is elevated today. 160/90.  - Advised patient to take BP meds before visit.  - Flu shot given.   I personally saw and evaluated the patient. I have reviewed the medical student note and agree with the documentation. I personally completed the physical exam. Additions to the medical student note are made in blue.   Donnella Sham MD

## 2015-01-06 NOTE — Patient Instructions (Addendum)
Thank you for coming in today!  We are not concerned for anything worse than muscle pain. We want you to continue taking 3 tylenol a day. If the pain is worse you can take an extra one at night. Do some stretching exercises of your neck and back as we spoke about in the office. HEAT is really important. You can try the sock with rice in the microwave or if you have a heating pack. Try this for 4-5 days let us know and we can try a muscle relaxant.   Cough - please go to the hospital to get a chest xray.  Difficulty swallowing - please follow up with GI.   Elevated blood pressure - please take your blood pressure medications when you get home. Please check you blood pressure and home and call Dr. Randolm Idol with the results.

## 2015-01-06 NOTE — Patient Instructions (Addendum)
Laura Griffin , Thank you for taking time to come for your Medicare Wellness Visit. I appreciate your ongoing commitment to your health goals. Please review the following plan we discussed and let me know if I can assist you in the future.   These are the goals we discussed: Goals    . Blood Pressure < 140/90    . Eat more fruits and vegetables    . Exercise 3x per week (30 min per time)    . Weight < 200 lb (90.719 kg)       This is a list of the screening recommended for you and due dates:  Health Maintenance  Topic Date Due  .  Hepatitis C: One time screening is recommended by Center for Disease Control  (CDC) for  adults born from [birth year] through 1967.   [birth year]  . Flu Shot  11/01/2014  . Mammogram  07/22/2015  . Tetanus Vaccine  04/02/2016  . Colon Cancer Screening  06/08/2024  . DEXA scan (bone density measurement)  Completed  . Shingles Vaccine  Completed  . Pneumonia vaccines  Completed   Consider contacting Dr Gerilyn Pilgrim, PhD nutritionist to discuss health eating habits. Fat and Cholesterol Restricted Diet High levels of fat and cholesterol in your blood may lead to various health problems, such as diseases of the heart, blood vessels, gallbladder, liver, and pancreas. Fats are concentrated sources of energy that come in various forms. Certain types of fat, including saturated fat, may be harmful in excess. Cholesterol is a substance needed by your body in small amounts. Your body makes all the cholesterol it needs. Excess cholesterol comes from the food you eat. When you have high levels of cholesterol and saturated fat in your blood, health problems can develop because the excess fat and cholesterol will gather along the walls of your blood vessels, causing them to narrow. Choosing the right foods will help you control your intake of fat and cholesterol. This will help keep the levels of these substances in your blood within normal limits and reduce your risk of disease. WHAT IS  MY PLAN? Your health care provider recommends that you:  Get no more than __________ % of the total calories in your daily diet from fat.  Limit your intake of saturated fat to less than ______% of your total calories each day.  Limit the amount of cholesterol in your diet to less than _________mg per day. WHAT TYPES OF FAT SHOULD I CHOOSE?  Choose healthy fats more often. Choose monounsaturated and polyunsaturated fats, such as olive and canola oil, flaxseeds, walnuts, almonds, and seeds.  Eat more omega-3 fats. Good choices include salmon, mackerel, sardines, tuna, flaxseed oil, and ground flaxseeds. Aim to eat fish at least two times a week.  Limit saturated fats. Saturated fats are primarily found in animal products, such as meats, butter, and cream. Plant sources of saturated fats include palm oil, palm kernel oil, and coconut oil.  Avoid foods with partially hydrogenated oils in them. These contain trans fats. Examples of foods that contain trans fats are stick margarine, some tub margarines, cookies, crackers, and other baked goods. WHAT GENERAL GUIDELINES DO I NEED TO FOLLOW? These guidelines for healthy eating will help you control your intake of fat and cholesterol:  Check food labels carefully to identify foods with trans fats or high amounts of saturated fat.  Fill one half of your plate with vegetables and green salads.  Fill one fourth of your plate with whole grains. Look  for the word "whole" as the first word in the ingredient list.  Fill one fourth of your plate with lean protein foods.  Limit fruit to two servings a day. Choose fruit instead of juice.  Eat more foods that contain soluble fiber. Examples of foods that contain this type of fiber are apples, broccoli, carrots, beans, peas, and barley. Aim to get 20-30 g of fiber per day.  Eat more home-cooked food and less restaurant, buffet, and fast food.  Limit or avoid alcohol.  Limit foods high in starch and  sugar.  Limit fried foods.  Cook foods using methods other than frying. Baking, boiling, grilling, and broiling are all great options.  Lose weight if you are overweight. Losing just 5-10% of your initial body weight can help your overall health and prevent diseases such as diabetes and heart disease. WHAT FOODS CAN I EAT? Grains Whole grains, such as whole wheat or whole grain breads, crackers, cereals, and pasta. Unsweetened oatmeal, bulgur, barley, quinoa, or brown rice. Corn or whole wheat flour tortillas. Vegetables Fresh or frozen vegetables (raw, steamed, roasted, or grilled). Green salads. Fruits All fresh, canned (in natural juice), or frozen fruits. Meat and Other Protein Products Ground beef (85% or leaner), grass-fed beef, or beef trimmed of fat. Skinless chicken or [place]. Ground chicken or [place]. Pork trimmed of fat. All fish and seafood. Eggs. Dried beans, peas, or lentils. Unsalted nuts or seeds. Unsalted canned or dry beans. Dairy Low-fat dairy products, such as skim or 1% milk, 2% or reduced-fat cheeses, low-fat ricotta or cottage cheese, or plain low-fat yogurt. Fats and Oils Tub margarines without trans fats. Light or reduced-fat mayonnaise and salad dressings. Avocado. Olive, canola, sesame, or safflower oils. Natural peanut or almond butter (choose ones without added sugar and oil). The items listed above may not be a complete list of recommended foods or beverages. Contact your dietitian for more options. WHAT FOODS ARE NOT RECOMMENDED? Grains White bread. White pasta. White rice. Cornbread. Bagels, pastries, and croissants. Crackers that contain trans fat. Vegetables White potatoes. Corn. Creamed or fried vegetables. Vegetables in a cheese sauce. Fruits Dried fruits. Canned fruit in light or heavy syrup. Fruit juice. Meat and Other Protein Products Fatty cuts of meat. Ribs, chicken wings, bacon, sausage, bologna, salami, chitterlings, fatback, hot dogs,  bratwurst, and packaged luncheon meats. Liver and organ meats. Dairy Whole or 2% milk, cream, half-and-half, and cream cheese. Whole milk cheeses. Whole-fat or sweetened yogurt. Full-fat cheeses. Nondairy creamers and whipped toppings. Processed cheese, cheese spreads, or cheese curds. Sweets and Desserts Corn syrup, sugars, honey, and molasses. Candy. Jam and jelly. Syrup. Sweetened cereals. Cookies, pies, cakes, donuts, muffins, and ice cream. Fats and Oils Butter, stick margarine, lard, shortening, ghee, or bacon fat. Coconut, palm kernel, or palm oils. Beverages Alcohol. Sweetened drinks (such as sodas, lemonade, and fruit drinks or punches). The items listed above may not be a complete list of foods and beverages to avoid. Contact your dietitian for more information.   This information is not intended to replace advice given to you by your health care provider. Make sure you discuss any questions you have with your health care provider.   Document Released: 03/19/2005 Document Revised: 04/09/2014 Document Reviewed: 06/17/2013 Elsevier Interactive Patient Education 2016 [employer] Inc.  Glaucoma Glaucoma happens when the fluid pressure in the eyeball is too high. If the pressure stays high for too long, the eye may become damaged. This can cause a loss of vision. The most common type  of glaucoma causes pressure in the eye to go up slowly. There may be no symptoms at first. Testing for this condition can help to find the condition before damage occurs. Early treatment can often stop vision loss. HOME CARE  Take medicines only as told by your doctor.  Use your eye drops exactly as told. You will probably need to use these for the rest of your life.  Exercise often. Talk with your doctor about which types of exercise are safe for you. Avoid standing on your head.  Keep all follow-up visits as told by your doctor. This is important. GET HELP IF:  Your symptoms get worse. GET HELP RIGHT  AWAY IF:  You have bad pain in your eye.  You have vision problems.  You have a bad headache in the area around your eye.  You feel sick to your stomach (nauseous) or you throw up (vomit).  You start to have problems with your other eye.   This information is not intended to replace advice given to you by your health care provider. Make sure you discuss any questions you have with your health care provider.   Document Released: 12/27/2007 Document Revised: 04/09/2014 Document Reviewed: 12/29/2013 Elsevier Interactive Patient Education 2016 [employer].  Hearing Loss Hearing loss is a partial or total loss of the ability to hear. This can be temporary or permanent, and it can happen in one or both ears. Hearing loss may be referred to as deafness. Medical care is necessary to treat hearing loss properly and to prevent the condition from getting worse. Your hearing may partially or completely come back, depending on what caused your hearing loss and how severe it is. In some cases, hearing loss is permanent. CAUSES Common causes of hearing loss include:   Too much wax in the ear canal.   Infection of the ear canal or middle ear.   Fluid in the middle ear.   Injury to the ear or surrounding area.   An object stuck in the ear.   Prolonged exposure to loud sounds, such as music.  Less common causes of hearing loss include:   Tumors in the ear.   Viral or bacterial infections, such as meningitis.   A hole in the eardrum (perforated eardrum).  Problems with the hearing nerve that sends signals between the brain and the ear.  Certain medicines.  SYMPTOMS  Symptoms of this condition may include:  Difficulty telling the difference between sounds.  Difficulty following a conversation when there is background noise.  Lack of response to sounds in your environment. This may be most noticeable when you do not respond to startling sounds.  Needing to turn up the  volume on the television, radio, etc.  Ringing in the ears.  Dizziness.  Pain in the ears. DIAGNOSIS This condition is diagnosed based on a physical exam and a hearing test (audiometry). The audiometry test will be performed by a hearing specialist (audiologist). You may also be referred to an ear, nose, and throat (ENT) specialist (otolaryngologist).  TREATMENT Treatment for recent onset of hearing loss may include:   Ear wax removal.   Being prescribed medicines to prevent infection (antibiotics).   Being prescribed medicines to reduce inflammation (corticosteroids).  HOME CARE INSTRUCTIONS  If you were prescribed an antibiotic medicine, take it as told by your health care provider. Do not stop taking the antibiotic even if you start to feel better.  Take over-the-counter and prescription medicines only as told by your  health care provider.  Avoid loud noises.   Return to your normal activities as told by your health care provider. Ask your health care provider what activities are safe for you.  Keep all follow-up visits as told by your health care provider. This is important. SEEK MEDICAL CARE IF:   You feel dizzy.   You develop new symptoms.   You vomit or feel nauseous.   You have a fever.  SEEK IMMEDIATE MEDICAL CARE IF:  You develop sudden changes in your vision.   You have severe ear pain.   You have new or increased weakness.  You have a severe headache.   This information is not intended to replace advice given to you by your health care provider. Make sure you discuss any questions you have with your health care provider.   Document Released: 03/19/2005 Document Revised: 12/08/2014 Document Reviewed: 08/04/2014 Elsevier Interactive Patient Education [employer]

## 2015-01-07 ENCOUNTER — Telehealth: Payer: Self-pay | Admitting: Family Medicine

## 2015-01-07 DIAGNOSIS — M542 Cervicalgia: Secondary | ICD-10-CM | POA: Insufficient documentation

## 2015-01-07 LAB — HEPATITIS C ANTIBODY: HCV AB: NEGATIVE

## 2015-01-07 NOTE — Telephone Encounter (Signed)
Discussed negative chest xray results.

## 2015-01-07 NOTE — Assessment & Plan Note (Signed)
Blood pressure elevated today. Likely secondary to acute pain and not taking medications this morning. -Patient to record home blood pressure readings and call these into the office in one week

## 2015-01-07 NOTE — Assessment & Plan Note (Signed)
Patient presented for evaluation of right neck/shoulder pain that is consistent with muscle spasm of the trapezius. Patient does have a history of cervical degenerative disc disease which may be contributing to her current symptoms. No current red flag symptoms including weakness of the upper extremities. -Continue Tylenol 650 mg ER 3 times a day -Apply heat 2-3 times per day -Encouraged gentle range of motion exercises which were discussed in office today

## 2015-01-07 NOTE — Assessment & Plan Note (Signed)
Patient continues to have infrequent symptoms consistent with urge incontinence. She is not interested in pharmacotherapy today. -Continue to monitor clinically

## 2015-01-07 NOTE — Assessment & Plan Note (Signed)
Patient currently has exacerbation of low back pain. Primarily right-sided paraspinal with some radiation down the right buttock. No red flag signs or symptoms. -Continue Tylenol 650 mg ER 3 times a day, may take an extra dose before bedtime -Continue current gabapentin dose -Encouraged application of heat and gentle range of motion exercises which were discussed in office today

## 2015-01-10 ENCOUNTER — Encounter: Payer: Self-pay | Admitting: Family Medicine

## 2015-01-13 ENCOUNTER — Telehealth: Payer: Self-pay | Admitting: Family Medicine

## 2015-01-13 NOTE — Telephone Encounter (Signed)
10/7  124/78  10/8 139/77  10/9  143/90  10/10  127/84  10/11  130/78  10/12  172/101  10/13  140/82  Still having pain to back..On the scale btwn 1-10 it's - 8-10

## 2015-01-14 MED ORDER — CYCLOBENZAPRINE HCL 10 MG PO TABS: 10.0000 mg | ORAL_TABLET | Freq: Three times a day (TID) | ORAL | Status: DC | PRN

## 2015-01-14 NOTE — Telephone Encounter (Signed)
Returned patient call. BP's mostly controlled. No changes in BP's meds today. Will attempt trial of Flexeril for muscle spasm.

## 2015-01-17 ENCOUNTER — Other Ambulatory Visit: Payer: Self-pay | Admitting: Family Medicine

## 2015-01-17 ENCOUNTER — Telehealth: Payer: Self-pay | Admitting: Family Medicine

## 2015-01-17 NOTE — Telephone Encounter (Signed)
Pt calling because the medication given at last appt with PCP is not working for her back pain. She would like to discuss this with her provider over the phone as there are no appointments until 02/03/15 and she would only like to see her PCP. Sadie Reynolds, ASA

## 2015-01-18 MED ORDER — IBUPROFEN 600 MG PO TABS: 600.0000 mg | ORAL_TABLET | Freq: Three times a day (TID) | ORAL | Status: DC | PRN

## 2015-01-18 NOTE — Telephone Encounter (Signed)
Attempted to contact patient, no answer, left message that I would call back later today.

## 2015-01-18 NOTE — Telephone Encounter (Signed)
Pt is calling back because she is still in pain. She wants Dr. Randolm IdolFletke to call her as soon as possible or call her in something else for pain. jw

## 2015-01-18 NOTE — Telephone Encounter (Signed)
Patient continues to have right sided low back pain with radiation down right buttock, no weakness in legs, no fevers, no bladder or bowel incontinence, pain not relieved with scheduled tylenol and recent trial of Flexeril, will send in ibuprofen 600 mg TID prn pain

## 2015-01-21 ENCOUNTER — Encounter: Payer: Self-pay | Admitting: Family Medicine

## 2015-01-21 ENCOUNTER — Ambulatory Visit
Admission: RE | Admit: 2015-01-21 | Discharge: 2015-01-21 | Disposition: A | Discharge status: Home / Self Care | Payer: Medicare Other | Source: Ambulatory Visit | Attending: Family Medicine | Admitting: Family Medicine

## 2015-01-21 ENCOUNTER — Ambulatory Visit (INDEPENDENT_AMBULATORY_CARE_PROVIDER_SITE_OTHER): Payer: Medicare Other | Admitting: Family Medicine

## 2015-01-21 VITALS — BP 130/78 | HR 60 | Temp 97.9°F | Ht 64.0 in | Wt 183.0 lb

## 2015-01-21 DIAGNOSIS — M533 Sacrococcygeal disorders, not elsewhere classified: Secondary | ICD-10-CM

## 2015-01-21 DIAGNOSIS — M545 Low back pain: Secondary | ICD-10-CM | POA: Diagnosis not present

## 2015-01-21 DIAGNOSIS — M47816 Spondylosis without myelopathy or radiculopathy, lumbar region: Secondary | ICD-10-CM | POA: Diagnosis not present

## 2015-01-21 DIAGNOSIS — M5431 Sciatica, right side: Secondary | ICD-10-CM | POA: Diagnosis not present

## 2015-01-21 MED ORDER — NAPROXEN 500 MG PO TABS: 500.0000 mg | ORAL_TABLET | Freq: Two times a day (BID) | ORAL | Status: DC | PRN

## 2015-01-21 MED ORDER — BACLOFEN 10 MG PO TABS: 5.0000 mg | ORAL_TABLET | Freq: Three times a day (TID) | ORAL | Status: DC | PRN

## 2015-01-21 NOTE — Assessment & Plan Note (Signed)
Likely flare of osteoarthritis, may have some SI joint dysfunction.  No red flags but given persistence and worsening of pain will check xrays to rule out bony lesion Stop flexeril and ibuprofen, switch to baclofen and naproxen Try heating pad Given handout on SI joint pain with exercises Refer to sports medicine to consider SI joint injections Patient agreeable to this plan

## 2015-01-21 NOTE — Progress Notes (Signed)
Patient ID: Laura Griffin, female   DOB: [birth year], 69 y.o.   MRN: [id number] Date of Visit: 01/21/2015   HPI:  Patient presents to follow up on R lower back pain.  Has had pain for about 3 weeks. Located in R lower back. Seen by Dr. Randolm IdolFletke previously, recommended tylenol. Added flexeril & ibuprofen when tylenol alone did not help. Flexeril gave no relief, ibuprofen helped a little on the first day but not sine then. Pain has shifted slightly into R buttock/gluteal area laterally. Has not tried heating pad. Pain worse with walking, a little better when lays on L hand side. Consists of a "pulling" sensation. Denies having fever, saddle anesthesia, lower extremity weakness, or problems with stooling or urination. Has not had any imaging of her back recently, had MRI done in 2013 showing degenerative changes and mild spinal stenosis  ROS: See HPI.  PMFSH: history GERD, glaucoma, hypertension, OA R knee  PHYSICAL EXAM: BP 130/78 mmHg  Pulse 60  Temp(Src) 97.9 F (36.6 C) (Oral)  Ht 5\' 4"  (1.626 m)  Wt 183 lb (83.008 kg)  BMI 31.40 kg/m2  LMP 04/02/1996 Gen: NAD, pleasant, cooperative HEENT: NCAT Back: R lower back TTP in musculature and over SI joint. Extremity: full strenght bilateral lower extremities with hip abduction, adduction, flexion, and knee flexion and extension. Gait limited by pain on R side. Sensation intact to  Light touch over bilateral lower extremities   ASSESSMENT/PLAN:  LOW BACK PAIN SYNDROME Likely flare of osteoarthritis, may have some SI joint dysfunction.  No red flags but given persistence and worsening of pain will check xrays to rule out bony lesion Stop flexeril and ibuprofen, switch to baclofen and naproxen Try heating pad Given handout on SI joint pain with exercises Refer to sports medicine to consider SI joint injections Patient agreeable to this plan    FOLLOW UP: Referring to sports medicine  [place]renadaBrittany J. Pollie MeyerMcIntyre, MD [facility]

## 2015-01-21 NOTE — Patient Instructions (Signed)
For your back pain: - stop cyclobenzaprine and ibuprofen - continue tylenol - use naproxen (antiinflammatory) and baclofen (muscle relaxer) - use heating pad - see handout with stretches you can do to help - I am referring you to sports medicine for your back pain. You will get a phone call to schedule this appointment. They may want to give you injections into your back.  Be well, Dr. Pollie MeyerMcIntyre

## 2015-01-28 ENCOUNTER — Other Ambulatory Visit: Payer: Self-pay | Admitting: Family Medicine

## 2015-02-08 ENCOUNTER — Encounter: Payer: Self-pay | Admitting: Family Medicine

## 2015-02-08 ENCOUNTER — Ambulatory Visit (INDEPENDENT_AMBULATORY_CARE_PROVIDER_SITE_OTHER): Payer: Medicare Other | Admitting: Family Medicine

## 2015-02-08 VITALS — BP 174/92 | HR 72 | Ht 64.0 in | Wt 183.0 lb

## 2015-02-08 DIAGNOSIS — M25551 Pain in right hip: Secondary | ICD-10-CM

## 2015-02-08 DIAGNOSIS — M7061 Trochanteric bursitis, right hip: Secondary | ICD-10-CM

## 2015-02-08 NOTE — Progress Notes (Signed)
Laura Griffin - 69 y.o. female MRN [id number]  Date of birth: 09-Feb-1946  CC: Right low back and gluteal pain.   SUBJECTIVE:   HPI  5 weeks of right lower back and right lateral hip pain:  - Described as an aching/throbbing pain.  No sharp or burning pain. Never distal to lateral knee.  - Most pain from lateral hip to lateral knee.  - No left sided pain.  - Currently intermittently taking naproxen,  1 dose every 3 days. - Baclofen did help with spasms, but has not taken it as it has since her  Spasms have resolved.  - No trauma or other acute injuries.   - Currenlty pain has greatly improved from a 9/10 at her appointment on 01/21/2015 to a 4/10 over the last few days.  - Worst near the end of the day when she has been on her feet too much.   - Uses a cane the majority of the time for balance.    Right knee pain/OA: no current exacerbation.    ROS:     14 point RoS negative other than that listed in HPI above.   HISTORY: Past Medical, Surgical, Social, and Family History Reviewed & Updated per EMR.  Pertinent Historical Findings include: HTN, GERD  OBJECTIVE: BP 174/92 mmHg  Pulse 72  Ht  (1.626 m)  Wt 183 lb (83.008 kg)  BMI 31.40 kg/m2  LMP 04/02/1996  Physical Exam  Calm, NAD Non-labored breathing  Back Exam: Inspection: unremarkable Motion: full, without limitation SLR seated: negative for radiculopathy     Sensory change: symmetric Reflex change:none.   Strength at foot Plantar-flexion:  / 5    Dorsi-flexion:  / 5    Eversion:  / 5   Inversion:  / 5 Leg strength Quad:  / 5   Hamstring:  / 5   Hip flexor:  / 5   Hip abductors:  / 5 Gait Walking:          Heels:           Toes:            Tandem:  [id number]  ROM IR: 80 Deg, ER: 80 Deg, Flexion: 120 Deg, Extension: 100 Deg, Abduction: 45 Deg, Adduction: 45 Deg Strength IR: 5/5, ER: 5/5, Flexion: 5/5, Extension: 5/5, Abduction: 5/5, Adduction: 5/5 Tender over right SI and greater troch, currently more  so over greater troch.  No tenderness over piriformis.  Unable to perform various hip maneuvers due to patient's pain level.     Mildly antalgic gait with a cane for balance.   MEDICATIONS, LABS & OTHER ORDERS: Previous Medications   ACETAMINOPHEN (TYLENOL) 650 MG CR TABLET    Take 2 tablets (1,300 mg total) by mouth every 8 (eight) hours as needed.   ASPIRIN (BAYER CHILDRENS ASPIRIN) 81 MG CHEWABLE TABLET    Chew 81 mg by mouth daily.     BACLOFEN (LIORESAL) 10 MG TABLET    Take 0.5 tablets (5 mg total) by mouth 3 (three) times daily as needed for muscle spasms.   BRIMONIDINE-TIMOLOL (COMBIGAN) 0.2-0.5 % OPHTHALMIC SOLUTION    Place 2 drops into both eyes every 12 (twelve) hours.     CHOLECALCIFEROL (VITAMIN D3) 1000 UNIT CAPSULE    Take 1,000 Units by mouth daily.     CYCLOBENZAPRINE (FLEXERIL) 10 MG TABLET    take 1 tablet by mouth three times a day if needed for muscle spasm   GABAPENTIN (NEURONTIN) 800 MG TABLET  take 1 tablet by mouth three times a day   HYDROCHLOROTHIAZIDE (MICROZIDE) 12.5 MG CAPSULE    take 1 capsule by mouth once daily   IBUPROFEN (ADVIL,MOTRIN) 600 MG TABLET    take 1 tablet by mouth every 8 hours if needed   LOSARTAN (COZAAR) 100 MG TABLET    take 1 tablet by mouth once daily   METOPROLOL SUCCINATE (TOPROL-XL) 100 MG 24 HR TABLET    take 1 tablet by mouth once daily   MULTIPLE VITAMIN (MULTIVITAMINS PO)    Take 1 tablet by mouth daily.     NAPROXEN (NAPROSYN) 500 MG TABLET    Take 1 tablet (500 mg total) by mouth 2 (two) times daily as needed for moderate pain.   OMEGA-3 FATTY ACIDS (FISH OIL) 1000 MG CAPS    Take 2 capsules by mouth daily.     PANTOPRAZOLE (PROTONIX) 40 MG TABLET    Take 40 mg by mouth 2 (two) times daily.   TRAVOPROST, BENZALKONIUM, (TRAVATAN Z) 0.004 % OPHTHALMIC SOLUTION    Place 1 drop into both eyes at bedtime.    Modified Medications   No medications on file   New Prescriptions   No medications on file   Discontinued Medications    No medications on file  No orders of the defined types were placed in this encounter.   ASSESSMENT & PLAN: See problem based charting & AVS for pt instructions.

## 2015-02-08 NOTE — Assessment & Plan Note (Addendum)
Exam most consistent today with trochanteric bursitis, although there may also be a secondary SI joint dysfunction. Minimal pain compared to previous visits.  Declined injection today, which is reasonable considering her improvement.  She will continue taking naproxen as needed (but never more than once a day due to BP).  She does have a history of trochanteric bursitis. .   - Provided handout of hip strengthening exercises.  - PT referral - Discussed maintaining mobility, while still being mindful of any instability.  - f/u PRN for trochanteric injection or further evaluation.

## 2015-02-10 ENCOUNTER — Ambulatory Visit: Payer: Medicare Other | Attending: Family Medicine | Admitting: Physical Therapy

## 2015-02-10 ENCOUNTER — Ambulatory Visit: Payer: Medicare Other | Admitting: Physical Therapy

## 2015-02-10 DIAGNOSIS — M25561 Pain in right knee: Secondary | ICD-10-CM | POA: Insufficient documentation

## 2015-02-10 DIAGNOSIS — M545 Low back pain, unspecified: Secondary | ICD-10-CM

## 2015-02-10 DIAGNOSIS — M2560 Stiffness of unspecified joint, not elsewhere classified: Secondary | ICD-10-CM

## 2015-02-10 DIAGNOSIS — R262 Difficulty in walking, not elsewhere classified: Secondary | ICD-10-CM

## 2015-02-10 DIAGNOSIS — M25551 Pain in right hip: Secondary | ICD-10-CM

## 2015-02-10 DIAGNOSIS — G8929 Other chronic pain: Secondary | ICD-10-CM

## 2015-02-10 DIAGNOSIS — R1310 Dysphagia, unspecified: Secondary | ICD-10-CM | POA: Insufficient documentation

## 2015-02-10 NOTE — Patient Instructions (Signed)
  Lower Trunk Rotation Stretch    Keeping back flat and feet together, rotate knees to left side. Hold __10-15__ seconds. Repeat ____10 times per set. Do __1-2__ sets per session. Do _1-2___ sessions per day.  http://orth.exer.us/123   Copyright  VHI. All rights reserved.  Hamstring: Stretch (Sitting)    Sit straight. Extend right knee until stretch is felt in back of thigh. Hold ___ seconds. Relax. Repeat ___ times. Do ___ times a day. Repeat with other leg. Advanced: Lean trunk forward.  Copyright  VHI. All rights reserved.    Knee to Chest    Lying supine, bend involved knee to chest _2-3__ times. Repeat with other leg. Do _2__ times per day.  Copyright  VHI. All rights reserved.

## 2015-02-11 NOTE — Therapy (Signed)
[facility] [address] [place], [place], [address] Phone: [phone]   Fax:  [phone]  Physical Therapy Evaluation  Patient Details  Name: Laura Griffin MRN: [id number] Date of Birth: 1945-10-25 Referring Provider: Pollie MeyerMcIntyre  Encounter Date: 02/10/2015      PT End of Session - 02/10/15 1539    Visit Number 1   Number of Visits 16   Date for PT Re-Evaluation 04/07/15   PT Start Time 1540   PT Stop Time 1622   PT Time Calculation (min) 42 min   Activity Tolerance Patient tolerated treatment well   Behavior During Therapy [facility] for tasks assessed/performed      Past Medical History  Diagnosis Date  . Left sided sciatica 8/95,11/00    CT confirmed   . Bursitis of shoulder, right 09/19/1999  . Arthritis of knee, right 03/11/2006  . Diverticulosis   . Glaucoma   . Osteoarthritis   . Mitral valve prolapse     per patient heart murmur  . Hypertension   . History of herniated intervertebral disc   . Cervical radiculopathy at C6 05/24/2011    Beginning Nov 2012. Multi-level foraminal narrowing in C-spine plain films   . Sciatica 05/30/2006    Qualifier: Diagnosis of  By: Sheffield SliderHale MD, Wayne  Involved left leg in 02/1999   . DIVERTICULOSIS OF COLON 05/30/2006    Colonoscopy 06/2014 - multiple diverticula, no other abnormalities, no further colonoscopies recommended given patient's age of 69    . Swallowing difficulty 07/06/2013  . Orthostatic hypotension 06/12/2013  . HYPERTENSION, BENIGN SYSTEMIC 05/30/2006        . Allergic rhinitis 07/19/2014  . GERD (gastroesophageal reflux disease) 07/19/2014    [facility].    Esaw Grandchild. OSTEOARTHRITIS, MULTI SITES 05/30/2006    She is stoic about pain and wants to avoid surgeries Past xrays have documented DJD in right knee and lumbar spine    . Osteoarthritis of right knee 12/26/2012    Confirmed on xray 07/2014   . Urge incontinence 06/01/2008    Qualifier: Diagnosis of  By: Sheffield SliderHale MD, Deniece PortelaWayne    . Pitting edema 07/19/2014    Bilateral 1+ pitting edema of calves first noted on 07/19/2014.    [name]. Overweight (BMI 25.0-29.9) 05/30/2006  . LOW BACK PAIN SYNDROME 05/10/2009    Qualifier: Diagnosis of  By: Lorenda HatchetSlade CMA,, Thekla  Mild C 3-4, 4-5 lumbar spinal stenosis MRI 08/2011, Diffuse lower thoracic and lumbar DDD   . Hearing decreased 08/07/2011    With whooshing tinnitus Mild bilateral loss on 08/2012 audiology who will recheck her hearing in one year      Past Surgical History  Procedure Laterality Date  . Tubal ligation  1973  . Vulva /perineum biopsy  2010    for hypopigmentation, non-specific result  . Cataract extraction  02/2011    left eye    There were no vitals filed for this visit.  Visit Diagnosis:  Pain in hip joint, right  Right-sided low back pain without sciatica  Difficulty in walking involving joint of pelvic region and thigh  Joint stiffness of spine  Knee pain, chronic, right      Subjective Assessment - 02/10/15 1537    Subjective Patient reports pain in Rt. low back and hip since about a month ago.  She has a long standing hx of low back pain. She has difficulty walking. bending, getting out of bed  Denies sensory changes,  radicular pain and red flag sx.  She has low balance confidence, uses cane.  She has had no injection, would like to try PT first.     Pertinent History Rt. knee OA, cervical spine radiculopathy, see snapshot   Limitations Walking;Standing;Lifting;House hold activities;Sitting  can't sit unsupported   How long can you sit comfortably? depends on the chair   How long can you stand comfortably? 10-15 min    How long can you walk comfortably? 15 min legs tired, pain    Diagnostic tests XR which revealed degenerative changes   Patient Stated Goals walk longer periods, balance    Currently in Pain? Yes   Pain Score 3    Pain Location Hip   Pain Orientation Right   Pain Descriptors / Indicators  Aching;Sore   Pain Type Chronic pain   Pain Radiating Towards Rt. thigh    Pain Onset More than a month ago   Pain Frequency Intermittent   Aggravating Factors  moving suddenly, walking and standing    Pain Relieving Factors gentle movement, heat, meds   Multiple Pain Sites No            [facility] PT Assessment - 02/10/15 1552    Assessment   Medical Diagnosis Rt. SIJ, hip pain    Referring Provider Pollie Meyer   Prior Therapy yes   Precautions   Precautions None   Restrictions   Weight Bearing Restrictions No   Balance Screen   Has the patient fallen in the past 6 months No   Has the patient had a decrease in activity level because of a fear of falling?  Yes   Is the patient reluctant to leave their home because of a fear of falling?  Yes   Home Environment   Living Environment Private residence   Home Access Stairs to enter   Prior Function   Level of Independence Independent   Vocation Retired   IT consultant   Overall Cognitive Status Within Functional Limits for tasks assessed   Observation/Other Assessments   Focus on Therapeutic Outcomes (FOTO)  NT   Sensation   Light Touch Appears Intact   Coordination   Gross Motor Movements are Fluid and Coordinated Not tested   Posture/Postural Control   Postural Limitations Rounded Shoulders;Forward head;Increased thoracic kyphosis;Flexed trunk   AROM   Right Hip Flexion 50   Left Hip Flexion 55   Right/Left Knee --  pain with Rt. knee flexion   Strength   Right Hip Flexion 3-/5   Right Hip Extension 3-/5   Right Hip ABduction 4/5   Left Hip Flexion 3+/5   Left Hip Extension 3+/5   Left Hip ABduction 4/5   Right Knee Flexion 4+/5   Right Knee Extension 4+/5   Left Knee Flexion 4+/5   Left Knee Extension 4+/5   Flexibility   Soft Tissue Assessment /Muscle Length yes   Hamstrings tight, pain in rt. ant knee with Rt. SLR (PROM)   Quadriceps tight   Palpation   SI assessment  pain along Rt. SI border   Palpation comment  gluteals, lateral hip (Gr. Troch.) painful sore into Rt. ITB to knee    Transfers   Transfers Sit to Stand   Sit to Stand 5: Supervision  low surface   Sit to Stand Details Verbal cues for technique           PT Education - 02/10/15 1538    Education provided Yes   Education Details PT/POC,  Person(s) Educated Patient   Methods Explanation;Demonstration   Comprehension Verbalized understanding;Returned demonstration          PT Short Term Goals - 02/11/15 0751    PT SHORT TERM GOAL #1   Title Pt will be I with initial HEP   Time 4   Period Weeks   Status New   PT SHORT TERM GOAL #2   Title Pt will be able to stand for short periods in  the home and have less pain in Rt. LE (25% less)   Time 4   Period Weeks   Status New   PT SHORT TERM GOAL #3   Title Pt will complete balance screen and set goal if appropriate   Time 4   Period Weeks   Status New   PT SHORT TERM GOAL #4   Title Pt will be able to complete car and sit to stand transfers with less difficulty, pain overall.    Time 4   Period Weeks   Status New           PT Long Term Goals - 02/11/15 0753    PT LONG TERM GOAL #1   Title Pt will be I with more advanced HEP as of last visit.    Time 8   Period Weeks   Status New   PT LONG TERM GOAL #2   Title Pt will be I with body mechanics, posture and carry over to functional mobility, squatting.    Time 8   Period Weeks   Status New   PT LONG TERM GOAL #3   Title Pt will be able to walk for 20 min and no increase in leg pain   Time 8   Period Weeks   Status New   PT LONG TERM GOAL #4   Title Pt will be able to do light housework without increase in pain.   Time 8   Period Weeks   Status New   PT LONG TERM GOAL #5   Title Pt will improve balance score to _______ TBD   Time 8   Period Weeks   Status New               Plan - 15-Feb-2015 1626    Clinical Impression Statement Patient presents with signs and symptoms of degenerative  changes in SIJ, Lumbar spine.  Rt. Gr Troch and ITB inflamed as well. Pt limited in mobilty due to Rt. knee OA.     Pt will benefit from skilled therapeutic intervention in order to improve on the following deficits Abnormal gait;Decreased range of motion;Difficulty walking;Increased fascial restricitons;Decreased activity tolerance;Pain;Improper body mechanics;Decreased balance;Decreased mobility;Decreased strength;Postural dysfunction   Rehab Potential Good   PT Frequency 2x / week   PT Duration 8 weeks   PT Treatment/Interventions ADLs/Self Care Home Management;Ultrasound;Cryotherapy;Electrical Stimulation;Iontophoresis 4mg /ml Dexamethasone;Moist Heat;Balance training;Therapeutic exercise;Manual techniques;Functional mobility training;Gait training;Patient/family education;Passive range of motion;Taping;Therapeutic activities   PT Next Visit Plan Check initial HEP, modaiuliteis for pain, anti-inflammatory modalities to Rt. hip. balance screen   PT Home Exercise Plan LTR, knee to chest and seated hamstring   Consulted and Agree with Plan of Care Patient          G-Codes - 02/15/2015 0758    Functional Assessment Tool Used clinical judgement   Functional Limitation Mobility: Walking and moving around   Mobility: Walking and Moving Around Current Status (Z6109) At least 40 percent but less than 60 percent impaired, limited or restricted   Mobility: Walking and  Moving Around Goal Status [id number]) At least 20 percent but less than 40 percent impaired, limited or restricted       Problem List Patient Active Problem List   Diagnosis Date Noted  . Trapezius muscle spasm 01/07/2015  . Allergic rhinitis 07/19/2014  . GERD (gastroesophageal reflux disease) 07/19/2014  . Pitting edema 07/19/2014  . Osteoarthritis of right knee 12/26/2012  . GLAUCOMA 11/04/2009  . LOW BACK PAIN SYNDROME 05/10/2009  . URGE INCONTINENCE 06/01/2008  . Overweight (BMI 25.0-29.9) 05/30/2006  . HYPERTENSION, BENIGN  SYSTEMIC 05/30/2006  . OSTEOARTHRITIS, MULTI SITES 05/30/2006    PAA,JENNIFER 02/11/2015, 12:02 PM  [facility] [address] [place], [place], [address] Phone: [phone]   Fax:  [phone]  Name: Laura Griffin MRN: [id number] Date of Birth: [birth year]  Karie Mainland, PT 02/11/2015 12:04 PM Phone: [phone] Fax: [phone]

## 2015-02-16 ENCOUNTER — Ambulatory Visit: Payer: Medicare Other | Admitting: Physical Therapy

## 2015-02-16 DIAGNOSIS — G8929 Other chronic pain: Secondary | ICD-10-CM

## 2015-02-16 DIAGNOSIS — M25551 Pain in right hip: Secondary | ICD-10-CM

## 2015-02-16 DIAGNOSIS — M545 Low back pain, unspecified: Secondary | ICD-10-CM

## 2015-02-16 DIAGNOSIS — R262 Difficulty in walking, not elsewhere classified: Secondary | ICD-10-CM | POA: Diagnosis not present

## 2015-02-16 DIAGNOSIS — M2560 Stiffness of unspecified joint, not elsewhere classified: Secondary | ICD-10-CM | POA: Diagnosis not present

## 2015-02-16 DIAGNOSIS — M25561 Pain in right knee: Secondary | ICD-10-CM | POA: Diagnosis not present

## 2015-02-16 NOTE — Patient Instructions (Signed)

## 2015-02-16 NOTE — Therapy (Signed)
[facility] [address] [place], [place], [address] Phone: [phone]   Fax:  [phone]  Physical Therapy Treatment  Patient Details  Name: Laura Griffin MRN: [id number] Date of Birth: 1946-01-22 Referring Provider: Pollie Meyer  Encounter Date: 02/16/2015      PT End of Session - 02/16/15 1258    Visit Number 2   Number of Visits 16   Date for PT Re-Evaluation 04/07/15   PT Start Time 0744   PT Stop Time 0805   PT Time Calculation (min) 21 min   Activity Tolerance Patient tolerated treatment well   Behavior During Therapy [facility] for tasks assessed/performed      Past Medical History  Diagnosis Date  . Left sided sciatica 8/95,11/00    CT confirmed   . Bursitis of shoulder, right 09/19/1999  . Arthritis of knee, right 03/11/2006  . Diverticulosis   . Glaucoma   . Osteoarthritis   . Mitral valve prolapse     per patient heart murmur  . Hypertension   . History of herniated intervertebral disc   . Cervical radiculopathy at C6 05/24/2011    Beginning Nov 2012. Multi-level foraminal narrowing in C-spine plain films   . Sciatica 05/30/2006    Qualifier: Diagnosis of  By: Sheffield Slider MD, Wayne  Involved left leg in 02/1999   . DIVERTICULOSIS OF COLON 05/30/2006    Colonoscopy 06/2014 - multiple diverticula, no other abnormalities, no further colonoscopies recommended given patient's age of 16    . Swallowing difficulty 07/06/2013  . Orthostatic hypotension 06/12/2013  . HYPERTENSION, BENIGN SYSTEMIC 05/30/2006        . Allergic rhinitis 07/19/2014  . GERD (gastroesophageal reflux disease) 07/19/2014    Diagnosed clinically by [facility] in January of 2016.    Esaw Grandchild, MULTI SITES 05/30/2006    She is stoic about pain and wants to avoid surgeries Past xrays have documented DJD in right knee and lumbar spine    . Osteoarthritis of right knee 12/26/2012    Confirmed on xray 07/2014   . Urge incontinence 06/01/2008   Qualifier: Diagnosis of  By: Sheffield Slider MD, Deniece Portela    . Pitting edema 07/19/2014    Bilateral 1+ pitting edema of calves first noted on 07/19/2014.    [name] Overweight (BMI 25.0-29.9) 05/30/2006  . LOW BACK PAIN SYNDROME 05/10/2009    Qualifier: Diagnosis of  By: Lorenda Hatchet CMA,, Thekla  Mild C 3-4, 4-5 lumbar spinal stenosis MRI 08/2011, Diffuse lower thoracic and lumbar DDD   . Hearing decreased 08/07/2011    With whooshing tinnitus Mild bilateral loss on 08/2012 audiology who will recheck her hearing in one year      Past Surgical History  Procedure Laterality Date  . Tubal ligation  1973  . Vulva /perineum biopsy  2010    for hypopigmentation, non-specific result  . Cataract extraction  02/2011    left eye    There were no vitals filed for this visit.  Visit Diagnosis:  Pain in hip joint, right  Right-sided low back pain without sciatica  Joint stiffness of spine  Knee pain, chronic, right      Subjective Assessment - 02/16/15 0754    Subjective has questions about exercise.  Hamstrings hard to hold.   Currently in Pain? Yes   Pain Score 6    Multiple Pain Sites --  knee rt.  am pain arthritis stiff achey  [facility] Adult PT Treatment/Exercise - 02/16/15 0745    Self-Care   Self-Care --  posture education, Handout reviewed briefly   Lumbar Exercises: Stretches   Passive Hamstring Stretch 3 reps;30 seconds   Single Knee to Chest Stretch 5 reps  care taken to not irritate knee   Lower Trunk Rotation 3 reps;20 seconds                PT Education - 02/16/15 1257    Education provided Yes   Education Details ADL, exercise review   Person(s) Educated Patient   Methods Explanation;Demonstration;Verbal cues;Handout   Comprehension Verbalized understanding;Returned demonstration;Tactile cues required          PT Short Term Goals - 02/16/15 1301    PT SHORT TERM GOAL #1   Baseline cues needed   Time 4   Period Weeks   Status On-going    PT SHORT TERM GOAL #2   Title Pt will be able to stand for short periods in  the home and have less pain in Rt. LE (25% less)   Time 4   Period Weeks   Status On-going   PT SHORT TERM GOAL #3   Title Pt will complete balance screen and set goal if appropriate   Time 4   Period Weeks   Status Unable to assess   PT SHORT TERM GOAL #4   Title Pt will be able to complete car and sit to stand transfers with less difficulty, pain overall.    Time 4   Period Weeks   Status On-going           PT Long Term Goals - 02/11/15 0753    PT LONG TERM GOAL #1   Title Pt will be I with more advanced HEP as of last visit.    Time 8   Period Weeks   Status New   PT LONG TERM GOAL #2   Title Pt will be I with body mechanics, posture and carry over to functional mobility, squatting.    Time 8   Period Weeks   Status New   PT LONG TERM GOAL #3   Title Pt will be able to walk for 20 min and no increase in leg pain   Time 8   Period Weeks   Status New   PT LONG TERM GOAL #4   Title Pt will be able to do light housework without increase in pain.   Time 8   Period Weeks   Status New   PT LONG TERM GOAL #5   Title Pt will improve balance score to _______ TBD   Time 8   Period Weeks   Status New               Plan - 02/16/15 1259    Clinical Impression Statement Short session patient late.  She was interested in learning how to move correctly with posture for ADL's   PT Next Visit Plan Balance screen.     PT Home Exercise Plan LTR, knee to chest and seated hamstring   Consulted and Agree with Plan of Care Patient        Problem List Patient Active Problem List   Diagnosis Date Noted  . Trapezius muscle spasm 01/07/2015  . Allergic rhinitis 07/19/2014  . GERD (gastroesophageal reflux disease) 07/19/2014  . Pitting edema 07/19/2014  . Osteoarthritis of right knee 12/26/2012  . GLAUCOMA 11/04/2009  . LOW BACK PAIN SYNDROME 05/10/2009  . URGE INCONTINENCE 06/01/2008  .  Overweight (BMI 25.0-29.9) 05/30/2006  . HYPERTENSION, BENIGN SYSTEMIC 05/30/2006  . OSTEOARTHRITIS, MULTI SITES 05/30/2006    HARRIS,KAREN 02/16/2015, 1:03 PM  [facility] [address] [place], [place], [address] Phone: [phone]   Fax:  [phone]  Name: Carie Caddyrene C Mealing MRN: [id number] Date of Birth: [birth year]    Liz BeachKaren Harris, PTA 02/16/2015 1:03 PM Phone: [phone] Fax: [phone]

## 2015-02-17 ENCOUNTER — Ambulatory Visit: Payer: Medicare Other | Admitting: Physical Therapy

## 2015-02-17 DIAGNOSIS — M2560 Stiffness of unspecified joint, not elsewhere classified: Secondary | ICD-10-CM

## 2015-02-17 DIAGNOSIS — M25551 Pain in right hip: Secondary | ICD-10-CM

## 2015-02-17 DIAGNOSIS — G8929 Other chronic pain: Secondary | ICD-10-CM | POA: Diagnosis not present

## 2015-02-17 DIAGNOSIS — M25561 Pain in right knee: Secondary | ICD-10-CM

## 2015-02-17 DIAGNOSIS — M545 Low back pain, unspecified: Secondary | ICD-10-CM

## 2015-02-17 DIAGNOSIS — R262 Difficulty in walking, not elsewhere classified: Secondary | ICD-10-CM | POA: Diagnosis not present

## 2015-02-17 NOTE — Patient Instructions (Signed)
(  Clinic) Flexion: Pelvic Tilt    Lie with neck supported, knees bent, feet flat. Tighten and suck stomach in, pushing back down against surface. Do not push down with legs. Repeat _10___ times. 5 seconds .     While holding tilt, move knees apart and together while breathing.  10 X.   Copyright  VHI. All rights reserved.

## 2015-02-17 NOTE — Therapy (Signed)
[facility] [address] [place], [place], [address] Phone: [phone]   Fax:  [phone]  Physical Therapy Treatment  Patient Details  Name: Laura Griffin MRN: [id number] Date of Birth: 05-28-1945 Referring Provider: Pollie MeyerMcIntyre  Encounter Date: 02/17/2015      PT End of Session - 02/17/15 1241    Visit Number 3   Number of Visits 16   Date for PT Re-Evaluation 04/07/15   PT Start Time 0933   PT Stop Time 1020   PT Time Calculation (min) 47 min      Past Medical History  Diagnosis Date  . Left sided sciatica 8/95,11/00    CT confirmed   . Bursitis of shoulder, right 09/19/1999  . Arthritis of knee, right 03/11/2006  . Diverticulosis   . Glaucoma   . Osteoarthritis   . Mitral valve prolapse     per patient heart murmur  . Hypertension   . History of herniated intervertebral disc   . Cervical radiculopathy at C6 05/24/2011    Beginning Nov 2012. Multi-level foraminal narrowing in C-spine plain films   . Sciatica 05/30/2006    Qualifier: Diagnosis of  By: Sheffield SliderHale MD, Wayne  Involved left leg in 02/1999   . DIVERTICULOSIS OF COLON 05/30/2006    Colonoscopy 06/2014 - multiple diverticula, no other abnormalities, no further colonoscopies recommended given patient's age of 69    . Swallowing difficulty 07/06/2013  . Orthostatic hypotension 06/12/2013  . HYPERTENSION, BENIGN SYSTEMIC 05/30/2006        . Allergic rhinitis 07/19/2014  . GERD (gastroesophageal reflux disease) 07/19/2014    [facility] in [facility] of [facility].    Esaw Grandchild. OSTEOARTHRITIS, MULTI SITES 05/30/2006    She is stoic about pain and wants to avoid surgeries Past xrays have documented DJD in right knee and lumbar spine    . Osteoarthritis of right knee 12/26/2012    Confirmed on xray 07/2014   . Urge incontinence 06/01/2008    Qualifier: Diagnosis of  By: Sheffield SliderHale MD, Deniece PortelaWayne    . Pitting edema 07/19/2014    Bilateral 1+ pitting edema of calves  first noted on 07/19/2014.    [name]. Overweight (BMI 25.0-29.9) 05/30/2006  . LOW BACK PAIN SYNDROME 05/10/2009    Qualifier: Diagnosis of  By: Lorenda HatchetSlade CMA,, Thekla  Mild C 3-4, 4-5 lumbar spinal stenosis MRI 08/2011, Diffuse lower thoracic and lumbar DDD   . Hearing decreased 08/07/2011    With whooshing tinnitus Mild bilateral loss on 08/2012 audiology who will recheck her hearing in one year      Past Surgical History  Procedure Laterality Date  . Tubal ligation  1973  . Vulva /perineum biopsy  2010    for hypopigmentation, non-specific result  . Cataract extraction  02/2011    left eye    There were no vitals filed for this visit.  Visit Diagnosis:  Pain in hip joint, right  Right-sided low back pain without sciatica  Joint stiffness of spine  Knee pain, chronic, right      Subjective Assessment - 02/17/15 0954    Subjective Understads current exercises   How long can you walk comfortably? 15 min legs tired, pain    Currently in Pain? Yes   Pain Score 6    Pain Location Hip   Pain Orientation Right   Pain Descriptors / Indicators Aching;Sore   Pain Radiating Towards back   Pain Frequency Intermittent   Aggravating Factors  bending over, 1  position too long   Pain Relieving Factors moving, heat                         [facility] Adult PT Treatment/Exercise - 02/17/15 0935    Self-Care   Self-Care --  handout rewiewed, problem solved laundry, practiced modifica   Lumbar Exercises: Stretches   Pelvic Tilt 10 seconds  cues   Lumbar Exercises: Supine   Ab Set 10 reps   Clam 10 reps   Clam Limitations needs cues to breath   Manual Therapy   Manual Therapy Taping   Kinesiotex Create Space   Kinesiotix   Create Space HIP * pattern  Lateral hip                PT Education - 02/17/15 1226    Education provided Yes   Education Details ADL, tilt, clams   Person(s) Educated Patient   Methods Explanation;Demonstration;Tactile cues;Verbal cues;Handout    Comprehension Verbalized understanding;Returned demonstration          PT Short Term Goals - 02/16/15 1301    PT SHORT TERM GOAL #1   Baseline cues needed   Time 4   Period Weeks   Status On-going   PT SHORT TERM GOAL #2   Title Pt will be able to stand for short periods in  the home and have less pain in Rt. LE (25% less)   Time 4   Period Weeks   Status On-going   PT SHORT TERM GOAL #3   Title Pt will complete balance screen and set goal if appropriate   Time 4   Period Weeks   Status Unable to assess   PT SHORT TERM GOAL #4   Title Pt will be able to complete car and sit to stand transfers with less difficulty, pain overall.    Time 4   Period Weeks   Status On-going           PT Long Term Goals - 02/11/15 0753    PT LONG TERM GOAL #1   Title Pt will be I with more advanced HEP as of last visit.    Time 8   Period Weeks   Status New   PT LONG TERM GOAL #2   Title Pt will be I with body mechanics, posture and carry over to functional mobility, squatting.    Time 8   Period Weeks   Status New   PT LONG TERM GOAL #3   Title Pt will be able to walk for 20 min and no increase in leg pain   Time 8   Period Weeks   Status New   PT LONG TERM GOAL #4   Title Pt will be able to do light housework without increase in pain.   Time 8   Period Weeks   Status New   PT LONG TERM GOAL #5   Title Pt will improve balance score to _______ TBD   Time 8   Period Weeks   Status New               Plan - 02/17/15 1250    Clinical Impression Statement Patient using poor techniques with aALL's in the home.  She was interested ineverything we did today.  Hip was sore so taped it   PT Next Visit Plan Balance screen.     PT Home Exercise Plan tilt, clams   Consulted and Agree with Plan of Care Patient  Problem List Patient Active Problem List   Diagnosis Date Noted  . Trapezius muscle spasm 01/07/2015  . Allergic rhinitis 07/19/2014  . GERD  (gastroesophageal reflux disease) 07/19/2014  . Pitting edema 07/19/2014  . Osteoarthritis of right knee 12/26/2012  . GLAUCOMA 11/04/2009  . LOW BACK PAIN SYNDROME 05/10/2009  . URGE INCONTINENCE 06/01/2008  . Overweight (BMI 25.0-29.9) 05/30/2006  . HYPERTENSION, BENIGN SYSTEMIC 05/30/2006  . Youlanda Mighty SITES 05/30/2006    [name] 02/17/2015, 12:54 PM  [facility] [address] [place], [place], [address] Phone: [phone]   Fax:  [phone]  Name: LING FLESCH MRN: [id number] Date of Birth: June 28, 1945    Liz Beach, PTA 02/17/2015 12:54 PM Phone: [phone] Fax: [phone]

## 2015-02-22 ENCOUNTER — Ambulatory Visit: Payer: Medicare Other | Admitting: Physical Therapy

## 2015-02-22 DIAGNOSIS — M25561 Pain in right knee: Secondary | ICD-10-CM | POA: Diagnosis not present

## 2015-02-22 DIAGNOSIS — G8929 Other chronic pain: Secondary | ICD-10-CM | POA: Diagnosis not present

## 2015-02-22 DIAGNOSIS — M545 Low back pain, unspecified: Secondary | ICD-10-CM

## 2015-02-22 DIAGNOSIS — M2560 Stiffness of unspecified joint, not elsewhere classified: Secondary | ICD-10-CM | POA: Diagnosis not present

## 2015-02-22 DIAGNOSIS — M25551 Pain in right hip: Secondary | ICD-10-CM

## 2015-02-22 DIAGNOSIS — R262 Difficulty in walking, not elsewhere classified: Secondary | ICD-10-CM | POA: Diagnosis not present

## 2015-02-22 DIAGNOSIS — R1310 Dysphagia, unspecified: Secondary | ICD-10-CM

## 2015-02-22 NOTE — Therapy (Signed)
[place], [name], [address] Phone: [phone]   Fax:  [phone]  Physical Therapy Treatment  Patient Details  Name: Laura Griffin MRN: [id number] Date of Birth: July 29, 1945 Referring Provider: Ardelia Mems  Encounter Date: 02/22/2015      PT End of Session - 02/22/15 0838    Visit Number 4   Number of Visits 16   Date for PT Re-Evaluation 04/07/15   PT Start Time 0818  pt late   PT Stop Time 0910   PT Time Calculation (min) 52 min   Activity Tolerance Patient tolerated treatment well   Behavior During Therapy [facility] for tasks assessed/performed      Past Medical History  Diagnosis Date  . Left sided sciatica 8/95,11/00    CT confirmed   . Bursitis of shoulder, right 09/19/1999  . Arthritis of knee, right 03/11/2006  . Diverticulosis   . Glaucoma   . Osteoarthritis   . Mitral valve prolapse     per patient heart murmur  . Hypertension   . History of herniated intervertebral disc   . Cervical radiculopathy at C6 05/24/2011    Beginning Nov 2012. Multi-level foraminal narrowing in C-spine plain films   . Sciatica 05/30/2006    Qualifier: Diagnosis of  By: Walker Kehr MD, Wayne  Involved left leg in 02/1999   . DIVERTICULOSIS OF COLON 05/30/2006    Colonoscopy 06/2014 - multiple diverticula, no other abnormalities, no further colonoscopies recommended given patient's age of 48    . Swallowing difficulty 07/06/2013  . Orthostatic hypotension 06/12/2013  . HYPERTENSION, BENIGN SYSTEMIC 05/30/2006        . Allergic rhinitis 07/19/2014  . GERD (gastroesophageal reflux disease) 07/19/2014    Diagnosed clinically by [facility] in January of 2016.    [name], [place] SITES 05/30/2006    She is stoic about pain and wants to avoid surgeries Past xrays have documented DJD in right knee and lumbar spine    . Osteoarthritis of right knee 12/26/2012    Confirmed on xray 07/2014   . Urge incontinence  06/01/2008    Qualifier: Diagnosis of  By: Walker Kehr MD, Patrick Jupiter    . Pitting edema 07/19/2014    Bilateral 1+ pitting edema of calves first noted on 07/19/2014.    [name] Overweight (BMI 25.0-29.9) 05/30/2006  . LOW BACK PAIN SYNDROME 05/10/2009    Qualifier: Diagnosis of  By: Javier Glazier CMA,, Thekla  Mild C 3-4, 4-5 lumbar spinal stenosis MRI 08/2011, Diffuse lower thoracic and lumbar DDD   . Hearing decreased 08/07/2011    With whooshing tinnitus Mild bilateral loss on 08/2012 audiology who will recheck her hearing in one year      Past Surgical History  Procedure Laterality Date  . Tubal ligation  1973  . Vulva /perineum biopsy  2010    for hypopigmentation, non-specific result  . Cataract extraction  02/2011    left eye    There were no vitals filed for this visit.  Visit Diagnosis:  Pain in hip joint, right  Right-sided low back pain without sciatica  Joint stiffness of spine  Knee pain, chronic, right  Difficulty in walking involving joint of pelvic region and thigh  Difficulty swallowing      Subjective Assessment - 02/22/15 0817    Subjective No pain today, upset tummy.  Understands body mech.    Currently in Pain? No/denies            [facility] PT Assessment -  02/22/15 3953    Berg Balance Test   Sit to Stand Able to stand  independently using hands   Standing Unsupported Able to stand safely 2 minutes   Sitting with Back Unsupported but Feet Supported on Floor or Stool Able to sit safely and securely 2 minutes   Stand to Sit Controls descent by using hands   Transfers Able to transfer safely, definite need of hands   Standing Unsupported with Eyes Closed Able to stand 10 seconds safely   Standing Ubsupported with Feet Together Able to place feet together independently and stand 1 minute safely   From Standing, Reach Forward with Outstretched Arm Can reach forward >12 cm safely (5")   From Standing Position, Pick up Object from Floor Unable to pick up and needs supervision   From  Standing Position, Turn to Look Behind Over each Shoulder Looks behind from both sides and weight shifts well   Turn 360 Degrees Able to turn 360 degrees safely one side only in 4 seconds or less   Standing Unsupported, Alternately Place Feet on Step/Stool Able to stand independently and complete 8 steps >20 seconds   Standing Unsupported, One Foot in Front Able to take small step independently and hold 30 seconds   Standing on One Leg Able to lift leg independently and hold equal to or more than 3 seconds   Total Score 43   Berg comment: Uses cane in community, as recommended                     [facility] Adult PT Treatment/Exercise - 02/22/15 0958    Lumbar Exercises: Stretches   Single Knee to Chest Stretch 5 reps  care taken to not irritate knee   Lumbar Exercises: Supine   Ab Set 10 reps   Clam 10 reps   Other Supine Lumbar Exercises pelvic tilt A/P x 10    Cryotherapy   Number Minutes Cryotherapy 10 Minutes   Cryotherapy Location Knee   Type of Cryotherapy Ice pack                PT Education - 02/22/15 1252    Education provided Yes   Education Details Berg balance results   Person(s) Educated Patient   Methods Explanation   Comprehension Verbalized understanding          PT Short Term Goals - 02/22/15 0843    PT SHORT TERM GOAL #1   Title Pt will be I with initial HEP   Status Achieved   PT SHORT TERM GOAL #2   Title Pt will be able to stand for short periods in  the home and have less pain in Rt. LE (25% less)   Status Partially Met   PT SHORT TERM GOAL #3   Title Pt will complete balance screen and set goal if appropriate   Status Achieved   PT SHORT TERM GOAL #4   Title Pt will be able to complete car and sit to stand transfers with less difficulty, pain overall.    Status On-going           PT Long Term Goals - 02/22/15 0940    PT LONG TERM GOAL #1   Title Pt will be I with more advanced HEP as of last visit.    Status On-going    PT LONG TERM GOAL #2   Title Pt will be I with body mechanics, posture and carry over to functional mobility, squatting.  Status On-going   PT LONG TERM GOAL #3   Title Pt will be able to walk for 20 min and no increase in leg pain   Status On-going   PT LONG TERM GOAL #4   Title Pt will be able to do light housework without increase in pain.   Status On-going   PT LONG TERM GOAL #5   Title Pt will improve balance score to 48/56 to reduce risk of falls, further disability   Status On-going               Plan - 02/22/15 0840    Clinical Impression Statement No pain in hip today.  Appreciative of info on body mechanics.  Berg balance indicates need for cane in community which she uses.  Difficulty controlling ascent and descent from standing position.  Decreased confidence in balance.    PT Next Visit Plan LE strengthening, incorporate abdominals   PT Home Exercise Plan tilt, clams   Consulted and Agree with Plan of Care Patient        Problem List Patient Active Problem List   Diagnosis Date Noted  . Trapezius muscle spasm 01/07/2015  . Allergic rhinitis 07/19/2014  . GERD (gastroesophageal reflux disease) 07/19/2014  . Pitting edema 07/19/2014  . Osteoarthritis of right knee 12/26/2012  . GLAUCOMA 11/04/2009  . LOW BACK PAIN SYNDROME 05/10/2009  . URGE INCONTINENCE 06/01/2008  . Overweight (BMI 25.0-29.9) 05/30/2006  . HYPERTENSION, BENIGN SYSTEMIC 05/30/2006  . OSTEOARTHRITIS, MULTI SITES 05/30/2006    PAA,JENNIFER 02/22/2015, 12:53 PM  [facility] [address] [place], [name], [address] Phone: [phone]   Fax:  [phone]  Name: Laura Griffin MRN: [id number] Date of Birth: 1945-06-01    Raeford Razor, PT 02/22/2015 12:53 PM Phone: [phone] Fax: [phone]

## 2015-03-01 ENCOUNTER — Ambulatory Visit: Payer: Medicare Other | Admitting: Physical Therapy

## 2015-03-01 DIAGNOSIS — M2560 Stiffness of unspecified joint, not elsewhere classified: Secondary | ICD-10-CM

## 2015-03-01 DIAGNOSIS — M545 Low back pain, unspecified: Secondary | ICD-10-CM

## 2015-03-01 DIAGNOSIS — G8929 Other chronic pain: Secondary | ICD-10-CM

## 2015-03-01 DIAGNOSIS — M25561 Pain in right knee: Secondary | ICD-10-CM | POA: Diagnosis not present

## 2015-03-01 DIAGNOSIS — M25551 Pain in right hip: Secondary | ICD-10-CM

## 2015-03-01 DIAGNOSIS — R262 Difficulty in walking, not elsewhere classified: Secondary | ICD-10-CM | POA: Diagnosis not present

## 2015-03-01 NOTE — Therapy (Signed)
[place], [name], [address] Phone: [phone]   Fax:  [phone]  Physical Therapy Treatment  Patient Details  Name: Laura Griffin MRN: [id number] Date of Birth: Aug 26, 1945 Referring Provider: Ardelia Mems  Encounter Date: 03/01/2015      PT End of Session - 03/01/15 0950    Visit Number 5   Number of Visits 16   Date for PT Re-Evaluation 04/07/15   PT Start Time 0845   PT Stop Time 0950   PT Time Calculation (min) 65 min   Activity Tolerance Patient tolerated treatment well   Behavior During Therapy [facility] for tasks assessed/performed      Past Medical History  Diagnosis Date  . Left sided sciatica 8/95,11/00    CT confirmed   . Bursitis of shoulder, right 09/19/1999  . Arthritis of knee, right 03/11/2006  . Diverticulosis   . Glaucoma   . Osteoarthritis   . Mitral valve prolapse     per patient heart murmur  . Hypertension   . History of herniated intervertebral disc   . Cervical radiculopathy at C6 05/24/2011    Beginning Nov 2012. Multi-level foraminal narrowing in C-spine plain films   . Sciatica 05/30/2006    Qualifier: Diagnosis of  By: Walker Kehr MD, Wayne  Involved left leg in 02/1999   . DIVERTICULOSIS OF COLON 05/30/2006    Colonoscopy 06/2014 - multiple diverticula, no other abnormalities, no further colonoscopies recommended given patient's age of 56    . Swallowing difficulty 07/06/2013  . Orthostatic hypotension 06/12/2013  . HYPERTENSION, BENIGN SYSTEMIC 05/30/2006        . Allergic rhinitis 07/19/2014  . GERD (gastroesophageal reflux disease) 07/19/2014    Diagnosed clinically by [facility] in January of 2016.    [name], [place] SITES 05/30/2006    She is stoic about pain and wants to avoid surgeries Past xrays have documented DJD in right knee and lumbar spine    . Osteoarthritis of right knee 12/26/2012    Confirmed on xray 07/2014   . Urge incontinence 06/01/2008   Qualifier: Diagnosis of  By: Walker Kehr MD, Patrick Jupiter    . Pitting edema 07/19/2014    Bilateral 1+ pitting edema of calves first noted on 07/19/2014.    [name] Overweight (BMI 25.0-29.9) 05/30/2006  . LOW BACK PAIN SYNDROME 05/10/2009    Qualifier: Diagnosis of  By: Javier Glazier CMA,, Thekla  Mild C 3-4, 4-5 lumbar spinal stenosis MRI 08/2011, Diffuse lower thoracic and lumbar DDD   . Hearing decreased 08/07/2011    With whooshing tinnitus Mild bilateral loss on 08/2012 audiology who will recheck her hearing in one year      Past Surgical History  Procedure Laterality Date  . Tubal ligation  1973  . Vulva /perineum biopsy  2010    for hypopigmentation, non-specific result  . Cataract extraction  02/2011    left eye    There were no vitals filed for this visit.  Visit Diagnosis:  Pain in hip joint, right  Right-sided low back pain without sciatica  Joint stiffness of spine  Knee pain, chronic, right  Difficulty in walking involving joint of pelvic region and thigh      Subjective Assessment - 03/01/15 0855    Subjective I havent done much exercising.  No complaints of pain.    Currently in Pain? Yes   Pain Score 3    Pain Location Back   Pain Orientation Right   Pain Descriptors / Indicators  Aching   Pain Type Chronic pain   Pain Radiating Towards knee (post)    Pain Onset More than a month ago   Pain Frequency Intermittent   Aggravating Factors  weather change   Pain Relieving Factors exercising   Multiple Pain Sites No             [facility] Adult PT Treatment/Exercise - 03/01/15 0913    Lumbar Exercises: Stretches   Single Knee to Chest Stretch 3 reps;30 seconds   Lower Trunk Rotation 5 reps   Lumbar Exercises: Standing   Heel Raises 10 reps   Heel Raises Limitations 2 sets   Functional Squats 10 reps   Functional Squats Limitations cues for hip hinge   Other Standing Lumbar Exercises calf stretch x 2 each    Lumbar Exercises: Supine   Bridge 10 reps;5 seconds   Bridge Limitations  with ball    Other Supine Lumbar Exercises hamstring pull with physio ball x 10    Knee/Hip Exercises: Standing   Hip Abduction Stengthening;Both;2 sets;10 reps   Abduction Limitations on foam    Modalities   Modalities Moist Heat   Moist Heat Therapy   Number Minutes Moist Heat 15 Minutes   Moist Heat Location Lumbar Spine       Recumbant bike 4 min, not comfortable and difficult to adjust, changed to NuStep and did for 5 min L6, UE and LE>            PT Education - 03/01/15 1141    Education provided Yes   Education Details exercises   Person(s) Educated Patient   Methods Explanation   Comprehension Verbalized understanding          PT Short Term Goals - 02/22/15 0843    PT SHORT TERM GOAL #1   Title Pt will be I with initial HEP   Status Achieved   PT SHORT TERM GOAL #2   Title Pt will be able to stand for short periods in  the home and have less pain in Rt. LE (25% less)   Status Partially Met   PT SHORT TERM GOAL #3   Title Pt will complete balance screen and set goal if appropriate   Status Achieved   PT SHORT TERM GOAL #4   Title Pt will be able to complete car and sit to stand transfers with less difficulty, pain overall.    Status On-going           PT Long Term Goals - 02/22/15 0940    PT LONG TERM GOAL #1   Title Pt will be I with more advanced HEP as of last visit.    Status On-going   PT LONG TERM GOAL #2   Title Pt will be I with body mechanics, posture and carry over to functional mobility, squatting.    Status On-going   PT LONG TERM GOAL #3   Title Pt will be able to walk for 20 min and no increase in leg pain   Status On-going   PT LONG TERM GOAL #4   Title Pt will be able to do light housework without increase in pain.   Status On-going   PT LONG TERM GOAL #5   Title Pt will improve balance score to 48/56 to reduce risk of falls, further disability   Status On-going               Plan - 03/01/15 1143    Clinical  Impression Statement Patient is  progressing well, is easily tired in standing and cont to report pain increase the longer she stands.  She needs encouragement to do HEP, needs a review of body mechanics as she was observed trying to sit straight up from a supine position.    PT Next Visit Plan LE strengthening, incorporate abdominals, Check specific goals and make sure she can squat and transfer with good mechanics.    PT Home Exercise Plan tilt, clams, knee to chest.    Consulted and Agree with Plan of Care Patient        Problem List Patient Active Problem List   Diagnosis Date Noted  . Trapezius muscle spasm 01/07/2015  . Allergic rhinitis 07/19/2014  . GERD (gastroesophageal reflux disease) 07/19/2014  . Pitting edema 07/19/2014  . Osteoarthritis of right knee 12/26/2012  . GLAUCOMA 11/04/2009  . LOW BACK PAIN SYNDROME 05/10/2009  . URGE INCONTINENCE 06/01/2008  . Overweight (BMI 25.0-29.9) 05/30/2006  . HYPERTENSION, BENIGN SYSTEMIC 05/30/2006  . Candiss Norse SITES 05/30/2006    Tasmia Blumer 03/01/2015, 11:48 AM  [place], [name], [address] Phone: [phone]   Fax:  [phone]  Name: Laura Griffin MRN: [id number] Date of Birth: 1945/10/13    Raeford Razor, PT 03/01/2015 11:49 AM Phone: [phone] Fax: [phone]

## 2015-03-04 ENCOUNTER — Ambulatory Visit: Payer: Medicare Other | Attending: Family Medicine | Admitting: Physical Therapy

## 2015-03-04 DIAGNOSIS — R262 Difficulty in walking, not elsewhere classified: Secondary | ICD-10-CM

## 2015-03-04 DIAGNOSIS — R1310 Dysphagia, unspecified: Secondary | ICD-10-CM | POA: Diagnosis not present

## 2015-03-04 DIAGNOSIS — M545 Low back pain, unspecified: Secondary | ICD-10-CM

## 2015-03-04 DIAGNOSIS — M25551 Pain in right hip: Secondary | ICD-10-CM

## 2015-03-04 DIAGNOSIS — M25561 Pain in right knee: Secondary | ICD-10-CM | POA: Insufficient documentation

## 2015-03-04 DIAGNOSIS — M2560 Stiffness of unspecified joint, not elsewhere classified: Secondary | ICD-10-CM | POA: Diagnosis not present

## 2015-03-04 DIAGNOSIS — G8929 Other chronic pain: Secondary | ICD-10-CM | POA: Diagnosis not present

## 2015-03-04 NOTE — Therapy (Addendum)
[facility] [address] [place], [place], [address] Phone: [phone]   Fax:  [phone]  Physical Therapy Treatment  Patient Details  Name: Laura Griffin MRN: [id number] Date of Birth: [birth year] Referring Provider: Pollie Meyer  Encounter Date: 03/04/2015      PT End of Session - 03/04/15 1026    Visit Number 6   Number of Visits 16   Date for PT Re-Evaluation 04/07/15   PT Start Time 1015   PT Stop Time 1115   PT Time Calculation (min) 60 min   Activity Tolerance Patient tolerated treatment well   Behavior During Therapy [facility] for tasks assessed/performed      Past Medical History  Diagnosis Date  . Left sided sciatica 8/95,11/00    CT confirmed   . Bursitis of shoulder, right 09/19/1999  . Arthritis of knee, right 03/11/2006  . Diverticulosis   . Glaucoma   . Osteoarthritis   . Mitral valve prolapse     per patient heart murmur  . Hypertension   . History of herniated intervertebral disc   . Cervical radiculopathy at C6 05/24/2011    Beginning Nov 2012. Multi-level foraminal narrowing in C-spine plain films   . Sciatica 05/30/2006    Qualifier: Diagnosis of  By: Sheffield Slider MD, Wayne  Involved left leg in 02/1999   . DIVERTICULOSIS OF COLON 05/30/2006    Colonoscopy 06/2014 - multiple diverticula, no other abnormalities, no further colonoscopies recommended given patient's age of 68    . Swallowing difficulty 07/06/2013  . Orthostatic hypotension 06/12/2013  . HYPERTENSION, BENIGN SYSTEMIC 05/30/2006        . Allergic rhinitis 07/19/2014  . GERD (gastroesophageal reflux disease) 07/19/2014    Diagnosed clinically by [facility] in January of 2016.    Esaw Grandchild, MULTI SITES 05/30/2006    She is stoic about pain and wants to avoid surgeries Past xrays have documented DJD in right knee and lumbar spine    . Osteoarthritis of right knee 12/26/2012    Confirmed on xray 07/2014   . Urge incontinence 06/01/2008     Qualifier: Diagnosis of  By: Sheffield Slider MD, Deniece Portela    . Pitting edema 07/19/2014    Bilateral 1+ pitting edema of calves first noted on 07/19/2014.    [name] Overweight (BMI 25.0-29.9) 05/30/2006  . LOW BACK PAIN SYNDROME 05/10/2009    Qualifier: Diagnosis of  By: Lorenda Hatchet CMA,, Thekla  Mild C 3-4, 4-5 lumbar spinal stenosis MRI 08/2011, Diffuse lower thoracic and lumbar DDD   . Hearing decreased 08/07/2011    With whooshing tinnitus Mild bilateral loss on 08/2012 audiology who will recheck her hearing in one year      Past Surgical History  Procedure Laterality Date  . Tubal ligation  1973  . Vulva /perineum biopsy  2010    for hypopigmentation, non-specific result  . Cataract extraction  02/2011    left eye    There were no vitals filed for this visit.  Visit Diagnosis:  Pain in hip joint, right  Right-sided low back pain without sciatica  Joint stiffness of spine  Knee pain, chronic, right  Difficulty in walking involving joint of pelvic region and thigh  Difficulty swallowing      Subjective Assessment - 03/04/15 1026    Subjective Feeling pretty good. No pain reported   Currently in Pain? No/denies  [facility] Adult PT Treatment/Exercise - 03/04/15 1106    Lumbar Exercises: Stretches   Single Knee to Chest Stretch 3 reps;30 seconds   Lumbar Exercises: Standing   Heel Raises 10 reps  Heel raises and toe raises on compliant surface   Heel Raises Limitations 2 sets  with physioball   Other Standing Lumbar Exercises Hip abduction, hip extention, marches 20 reps each  all on airex (compliant surface)   Lumbar Exercises: Supine   Bridge 10 reps;5 seconds   Bridge Limitations with ball squeeze  with ball squeeze   Knee/Hip Exercises: Standing   Hip Abduction Stengthening;Both;2 sets;10 reps   Abduction Limitations on foam    Ankle Exercises: Aerobic   Stationary Bike Nustep L5 7 minutes                  PT Short Term Goals - 03/04/15  1033    PT SHORT TERM GOAL #1   Title Pt will be I with initial HEP   Baseline cues needed   Time 4   Period Weeks   Status Achieved   PT SHORT TERM GOAL #2   Title Pt will be able to stand for short periods in  the home and have less pain in Rt. LE (25% less)   Time 4   Period Weeks   Status Achieved   PT SHORT TERM GOAL #3   Title Pt will complete balance screen and set goal if appropriate   Time 4   Period Weeks   Status Achieved   PT SHORT TERM GOAL #4   Title Pt will be able to complete car and sit to stand transfers with less difficulty, pain overall.    Time 4   Period Weeks   Status Achieved           PT Long Term Goals - 03/04/15 1036    PT LONG TERM GOAL #1   Title Pt will be I with more advanced HEP as of last visit.    Time 8   Period Weeks   Status On-going   PT LONG TERM GOAL #2   Title Pt will be I with body mechanics, posture and carry over to functional mobility, squatting.    Time 8   Period Weeks   Status On-going   PT LONG TERM GOAL #3   Title Pt will be able to walk for 20 min and no increase in leg pain   Time 8   Period Weeks   Status On-going   PT LONG TERM GOAL #4   Title Pt will be able to do light housework without increase in pain.   Time 8   Period Weeks   Status On-going   PT LONG TERM GOAL #5   Title Pt will improve balance score to 48/56 to reduce risk of falls, further disability   Time 8   Period Weeks   Status On-going               Plan - 03/04/15 1112    Clinical Impression Statement Patient is progressing well as STG #2 and STG #4 were achieved in todays session (see flow chart). Todays Tx. was focused on balance training, Lower extremity strengthening, and increasing exercise tolerance, and only a moderate increase in pain or "pressure" per pt. report In Rt. Knee during exercises.   PT Next Visit Plan LE strengthening, incorporate abdominals, and make sure she can squat and transfer with good mechanics.  Problem List Patient Active Problem List   Diagnosis Date Noted  . Trapezius muscle spasm 01/07/2015  . Allergic rhinitis 07/19/2014  . GERD (gastroesophageal reflux disease) 07/19/2014  . Pitting edema 07/19/2014  . Osteoarthritis of right knee 12/26/2012  . GLAUCOMA 11/04/2009  . LOW BACK PAIN SYNDROME 05/10/2009  . URGE INCONTINENCE 06/01/2008  . Overweight (BMI 25.0-29.9) 05/30/2006  . HYPERTENSION, BENIGN SYSTEMIC 05/30/2006  . Youlanda MightyOSTEOARTHRITIS, MULTI SITES 05/30/2006   Kenney HousemanWesley Brianah Hopson, SPTA 03/04/2015 11:23 AM PHONE:[phone] FAX:[phone]  [facility] [address] [place], [place], [address] Phone: [phone]   Fax:  [phone]  Name: Carie Caddyrene C Popiel MRN: [id number] Date of Birth: [birth year]    Karie MainlandJennifer Paa, PT 03/04/2015 12:20 PM Phone: [phone] Fax: [phone]

## 2015-03-08 ENCOUNTER — Ambulatory Visit: Payer: Medicare Other | Admitting: Physical Therapy

## 2015-03-08 DIAGNOSIS — R262 Difficulty in walking, not elsewhere classified: Secondary | ICD-10-CM | POA: Diagnosis not present

## 2015-03-08 DIAGNOSIS — M545 Low back pain, unspecified: Secondary | ICD-10-CM

## 2015-03-08 DIAGNOSIS — G8929 Other chronic pain: Secondary | ICD-10-CM | POA: Diagnosis not present

## 2015-03-08 DIAGNOSIS — M25561 Pain in right knee: Secondary | ICD-10-CM

## 2015-03-08 DIAGNOSIS — M2560 Stiffness of unspecified joint, not elsewhere classified: Secondary | ICD-10-CM

## 2015-03-08 DIAGNOSIS — M25551 Pain in right hip: Secondary | ICD-10-CM | POA: Diagnosis not present

## 2015-03-08 NOTE — Therapy (Signed)
[place], [name], [address] Phone: [phone]   Fax:  [phone]  Physical Therapy Treatment  Patient Details  Name: Laura Griffin MRN: [id number] Date of Birth: Apr 30, 1945 Referring Provider: Ardelia Griffin  Encounter Date: 03/08/2015      PT End of Session - 03/08/15 1147    Visit Number 7   Number of Visits 16   Date for PT Re-Evaluation 04/07/15   PT Start Time 1110   PT Stop Time 1145   PT Time Calculation (min) 35 min   Activity Tolerance Patient tolerated treatment well   Behavior During Therapy [facility] for tasks assessed/performed      Past Medical History  Diagnosis Date  . Left sided sciatica 8/95,11/00    CT confirmed   . Bursitis of shoulder, right 09/19/1999  . Arthritis of knee, right 03/11/2006  . Diverticulosis   . Glaucoma   . Osteoarthritis   . Mitral valve prolapse     per patient heart murmur  . Hypertension   . History of herniated intervertebral disc   . Cervical radiculopathy at C6 05/24/2011    Beginning Nov 2012. Multi-level foraminal narrowing in C-spine plain films   . Sciatica 05/30/2006    Qualifier: Diagnosis of  By: Laura Kehr MD, Wayne  Involved left leg in 02/1999   . DIVERTICULOSIS OF COLON 05/30/2006    Colonoscopy 06/2014 - multiple diverticula, no other abnormalities, no further colonoscopies recommended given patient's age of 67    . Swallowing difficulty 07/06/2013  . Orthostatic hypotension 06/12/2013  . HYPERTENSION, BENIGN SYSTEMIC 05/30/2006        . Allergic rhinitis 07/19/2014  . GERD (gastroesophageal reflux disease) 07/19/2014    Diagnosed clinically by [facility] in January of 2016.    Laura Griffin, [place] SITES 05/30/2006    She is stoic about pain and wants to avoid surgeries Past xrays have documented DJD in right knee and lumbar spine    . Osteoarthritis of right knee 12/26/2012    Confirmed on xray 07/2014   . Urge incontinence 06/01/2008     Qualifier: Diagnosis of  By: Laura Kehr MD, Patrick Jupiter    . Pitting edema 07/19/2014    Bilateral 1+ pitting edema of calves first noted on 07/19/2014.    [name] Overweight (BMI 25.0-29.9) 05/30/2006  . LOW BACK PAIN SYNDROME 05/10/2009    Qualifier: Diagnosis of  By: Laura Griffin CMA,, Thekla  Mild C 3-4, 4-5 lumbar spinal stenosis MRI 08/2011, Diffuse lower thoracic and lumbar DDD   . Hearing decreased 08/07/2011    With whooshing tinnitus Mild bilateral loss on 08/2012 audiology who will recheck her hearing in one year      Past Surgical History  Procedure Laterality Date  . Tubal ligation  1973  . Vulva /perineum biopsy  2010    for hypopigmentation, non-specific result  . Cataract extraction  02/2011    left eye    There were no vitals filed for this visit.  Visit Diagnosis:  Pain in hip joint, right  Right-sided low back pain without sciatica  Joint stiffness of spine  Knee pain, chronic, right  Difficulty in walking involving joint of pelvic region and thigh      Subjective Assessment - 03/08/15 1125    Subjective A little pain in  Rt. knee , the weather gets me.  Pt arr 10 min late.              [facility] Adult PT Treatment/Exercise - 03/08/15  1132    Lumbar Exercises: Stretches   Active Hamstring Stretch 2 reps;30 seconds   Passive Hamstring Stretch 2 reps;30 seconds   Single Knee to Chest Stretch 2 reps;30 seconds   Lumbar Exercises: Aerobic   Stationary Bike NuStep L5, 6 min    Lumbar Exercises: Sidelying   Hip Abduction 10 reps   Knee/Hip Exercises: Seated   Clamshell with TheraBand Red  x 20    Marching Strengthening;Both;1 set;10 reps                PT Education - 03/08/15 1147    Education provided Yes   Education Details hamstring stretch    Person(s) Educated Patient   Methods Explanation;Demonstration;Tactile cues;Handout   Comprehension Verbalized understanding;Returned demonstration;Need further instruction          PT Short Term Goals - 03/04/15 1033     PT SHORT TERM GOAL #1   Title Pt will be I with initial HEP   Baseline cues needed   Time 4   Period Weeks   Status Achieved   PT SHORT TERM GOAL #2   Title Pt will be able to stand for short periods in  the home and have less pain in Rt. LE (25% less)   Time 4   Period Weeks   Status Achieved   PT SHORT TERM GOAL #3   Title Pt will complete balance screen and set goal if appropriate   Time 4   Period Weeks   Status Achieved   PT SHORT TERM GOAL #4   Title Pt will be able to complete car and sit to stand transfers with less difficulty, pain overall.    Time 4   Period Weeks   Status Achieved           PT Long Term Goals - 03/04/15 1036    PT LONG TERM GOAL #1   Title Pt will be I with more advanced HEP as of last visit.    Time 8   Period Weeks   Status On-going   PT LONG TERM GOAL #2   Title Pt will be I with body mechanics, posture and carry over to functional mobility, squatting.    Time 8   Period Weeks   Status On-going   PT LONG TERM GOAL #3   Title Pt will be able to walk for 20 min and no increase in leg pain   Time 8   Period Weeks   Status On-going   PT LONG TERM GOAL #4   Title Pt will be able to do light housework without increase in pain.   Time 8   Period Weeks   Status On-going   PT LONG TERM GOAL #5   Title Pt will improve balance score to 48/56 to reduce risk of falls, further disability   Time 8   Period Weeks   Status On-going               Plan - 03/08/15 1207    Clinical Impression Statement Focused today on hamstring flexibility and lateral hip strength to improve knee mechanics, gait.  No further goals met.    PT Next Visit Plan LE strengthening, incorporate abdominals, and make sure she can squat and transfer with good mechanics.    PT Home Exercise Plan tilt, clams, knee to chest. added seated hamstring and clam with bands   Consulted and Agree with Plan of Care Patient        Problem List Patient  Active Problem List    Diagnosis Date Noted  . Trapezius muscle spasm 01/07/2015  . Allergic rhinitis 07/19/2014  . GERD (gastroesophageal reflux disease) 07/19/2014  . Pitting edema 07/19/2014  . Osteoarthritis of right knee 12/26/2012  . GLAUCOMA 11/04/2009  . LOW BACK PAIN SYNDROME 05/10/2009  . URGE INCONTINENCE 06/01/2008  . Overweight (BMI 25.0-29.9) 05/30/2006  . HYPERTENSION, BENIGN SYSTEMIC 05/30/2006  . OSTEOARTHRITIS, MULTI SITES 05/30/2006    Tobby Fawcett 03/08/2015, 12:10 PM  [facility] [address] [place], [name], [address] Phone: [phone]   Fax:  [phone]  Name: Laura Griffin MRN: [id number] Date of Birth: 13-Jun-1945   Raeford Razor, PT 03/08/2015 12:11 PM Phone: [phone] Fax: [phone]

## 2015-03-08 NOTE — Patient Instructions (Signed)
   Seated marching with band wrapped around knees x 10-20

## 2015-03-10 ENCOUNTER — Ambulatory Visit: Payer: Medicare Other | Admitting: Physical Therapy

## 2015-03-10 DIAGNOSIS — M2560 Stiffness of unspecified joint, not elsewhere classified: Secondary | ICD-10-CM | POA: Diagnosis not present

## 2015-03-10 DIAGNOSIS — M545 Low back pain, unspecified: Secondary | ICD-10-CM

## 2015-03-10 DIAGNOSIS — M25551 Pain in right hip: Secondary | ICD-10-CM | POA: Diagnosis not present

## 2015-03-10 DIAGNOSIS — G8929 Other chronic pain: Secondary | ICD-10-CM

## 2015-03-10 DIAGNOSIS — R262 Difficulty in walking, not elsewhere classified: Secondary | ICD-10-CM | POA: Diagnosis not present

## 2015-03-10 DIAGNOSIS — M25561 Pain in right knee: Secondary | ICD-10-CM | POA: Diagnosis not present

## 2015-03-10 NOTE — Therapy (Signed)
[facility] [address] [place], [place], [address] Phone: [phone]   Fax:  [phone]  Physical Therapy Treatment  Patient Details  Name: Laura Griffin MRN: [id number] Date of Birth: 03/12/1946 Referring Provider: Pollie Meyer  Encounter Date: 03/10/2015      PT End of Session - 03/10/15 1100    Visit Number 8   Number of Visits 16   Date for PT Re-Evaluation 04/07/15   PT Start Time 1020   PT Stop Time 1103   PT Time Calculation (min) 43 min   Equipment Utilized During Treatment Gait belt   Activity Tolerance Patient tolerated treatment well   Behavior During Therapy [facility] for tasks assessed/performed      Past Medical History  Diagnosis Date  . Left sided sciatica 8/95,11/00    CT confirmed   . Bursitis of shoulder, right 09/19/1999  . Arthritis of knee, right 03/11/2006  . Diverticulosis   . Glaucoma   . Osteoarthritis   . Mitral valve prolapse     per patient heart murmur  . Hypertension   . History of herniated intervertebral disc   . Cervical radiculopathy at C6 05/24/2011    Beginning Nov 2012. Multi-level foraminal narrowing in C-spine plain films   . Sciatica 05/30/2006    Qualifier: Diagnosis of  By: Sheffield Slider MD, Wayne  Involved left leg in 02/1999   . DIVERTICULOSIS OF COLON 05/30/2006    Colonoscopy 06/2014 - multiple diverticula, no other abnormalities, no further colonoscopies recommended given patient's age of 44    . Swallowing difficulty 07/06/2013  . Orthostatic hypotension 06/12/2013  . HYPERTENSION, BENIGN SYSTEMIC 05/30/2006        . Allergic rhinitis 07/19/2014  . GERD (gastroesophageal reflux disease) 07/19/2014    Diagnosed clinically by [facility] in January of 2016.    Esaw Grandchild, MULTI SITES 05/30/2006    She is stoic about pain and wants to avoid surgeries Past xrays have documented DJD in right knee and lumbar spine    . Osteoarthritis of right knee 12/26/2012    Confirmed on  xray 07/2014   . Urge incontinence 06/01/2008    Qualifier: Diagnosis of  By: Sheffield Slider MD, Deniece Portela    . Pitting edema 07/19/2014    Bilateral 1+ pitting edema of calves first noted on 07/19/2014.    [name] Overweight (BMI 25.0-29.9) 05/30/2006  . LOW BACK PAIN SYNDROME 05/10/2009    Qualifier: Diagnosis of  By: Lorenda Hatchet CMA,, Thekla  Mild C 3-4, 4-5 lumbar spinal stenosis MRI 08/2011, Diffuse lower thoracic and lumbar DDD   . Hearing decreased 08/07/2011    With whooshing tinnitus Mild bilateral loss on 08/2012 audiology who will recheck her hearing in one year      Past Surgical History  Procedure Laterality Date  . Tubal ligation  1973  . Vulva /perineum biopsy  2010    for hypopigmentation, non-specific result  . Cataract extraction  02/2011    left eye    There were no vitals filed for this visit.  Visit Diagnosis:  Pain in hip joint, right  Right-sided low back pain without sciatica  Joint stiffness of spine  Knee pain, chronic, right  Difficulty in walking involving joint of pelvic region and thigh      Subjective Assessment - 03/10/15 1022    Subjective No pain today, I'm building my confidence.     Currently in Pain? No/denies            [facility] Adult PT  Treatment/Exercise - 03/10/15 1053    High Level Balance   High Level Balance Activities Side stepping;Backward walking;Head turns;Tandem walking   High Level Balance Comments min guard assist   Lumbar Exercises: Stretches   Active Hamstring Stretch 2 reps;30 seconds   Active Hamstring Stretch Limitations seated    Lumbar Exercises: Standing   Row Strengthening;Both;15 reps;Theraband   Theraband Level (Row) Level 1 (Yellow)   Row Limitations on foam    Shoulder Extension Strengthening;Both;20 reps   Theraband Level (Shoulder Extension) Level 1 (Yellow)   Shoulder Extension Limitations on foam    Knee/Hip Exercises: Standing   Hip Flexion Stengthening;Both;1 set;20 reps   Hip Flexion Limitations march on foam with 1 UE    Hip  Abduction Stengthening;Both;2 sets;10 reps   Abduction Limitations on foam    Hip Extension Stengthening;Both;1 set;15 reps;Knee straight   Extension Limitations on foam    Knee/Hip Exercises: Seated   Long Arc Quad Strengthening;Both;1 set;20 reps   Long Arc Quad Limitations with ball squeeze   Ball Squeeze x 10                   PT Short Term Goals - 03/04/15 1033    PT SHORT TERM GOAL #1   Title Pt will be I with initial HEP   Baseline cues needed   Time 4   Period Weeks   Status Achieved   PT SHORT TERM GOAL #2   Title Pt will be able to stand for short periods in  the home and have less pain in Rt. LE (25% less)   Time 4   Period Weeks   Status Achieved   PT SHORT TERM GOAL #3   Title Pt will complete balance screen and set goal if appropriate   Time 4   Period Weeks   Status Achieved   PT SHORT TERM GOAL #4   Title Pt will be able to complete car and sit to stand transfers with less difficulty, pain overall.    Time 4   Period Weeks   Status Achieved           PT Long Term Goals - 03/10/15 1101    PT LONG TERM GOAL #1   Title Pt will be I with more advanced HEP as of last visit.    Status On-going   PT LONG TERM GOAL #2   Title Pt will be I with body mechanics, posture and carry over to functional mobility, squatting.    Status On-going   PT LONG TERM GOAL #3   Title Pt will be able to walk for 20 min and no increase in leg pain   Baseline KNEE pain, not burning   Status On-going   PT LONG TERM GOAL #4   Title Pt will be able to do light housework without increase in pain.   Baseline does minimally and takes lots of breaks, not limited ffrom burning pain    Status Achieved   PT LONG TERM GOAL #5   Title Pt will improve balance score to 48/56 to reduce risk of falls, further disability   Status On-going               Plan - 03/10/15 1155    Clinical Impression Statement Pt was in standing for about 30 min today for balance and ther ex  without increase in back pain.  She is not getting as much burning leg pain now, only at night and it is resolved  with a bit of walking.  Her Rt. knee limits her ability to go for a more purposeful walk.  She tires quickly but is feeling more confident in hr balance and appreciative of PT.    PT Next Visit Plan practice squat-lift, etc. , cont with core/hip strength   PT Home Exercise Plan tilt, clams, knee to chest. added seated hamstring and clam with bands   Consulted and Agree with Plan of Care Patient        Problem List Patient Active Problem List   Diagnosis Date Noted  . Trapezius muscle spasm 01/07/2015  . Allergic rhinitis 07/19/2014  . GERD (gastroesophageal reflux disease) 07/19/2014  . Pitting edema 07/19/2014  . Osteoarthritis of right knee 12/26/2012  . GLAUCOMA 11/04/2009  . LOW BACK PAIN SYNDROME 05/10/2009  . URGE INCONTINENCE 06/01/2008  . Overweight (BMI 25.0-29.9) 05/30/2006  . HYPERTENSION, BENIGN SYSTEMIC 05/30/2006  . OSTEOARTHRITIS, MULTI SITES 05/30/2006    PAA,JENNIFER 03/10/2015, 11:59 AM  [facility] [address] [place], [place], [address] Phone: [phone]   Fax:  [phone]  Name: Laura Griffin MRN: [id number] Date of Birth: December 29, 1945   Karie Mainland, PT 03/10/2015 12:00 PM Phone: [phone] Fax: [phone]

## 2015-03-15 ENCOUNTER — Ambulatory Visit: Payer: Medicare Other | Admitting: Physical Therapy

## 2015-03-15 DIAGNOSIS — M25561 Pain in right knee: Secondary | ICD-10-CM | POA: Diagnosis not present

## 2015-03-15 DIAGNOSIS — M545 Low back pain, unspecified: Secondary | ICD-10-CM

## 2015-03-15 DIAGNOSIS — M2560 Stiffness of unspecified joint, not elsewhere classified: Secondary | ICD-10-CM | POA: Diagnosis not present

## 2015-03-15 DIAGNOSIS — R262 Difficulty in walking, not elsewhere classified: Secondary | ICD-10-CM | POA: Diagnosis not present

## 2015-03-15 DIAGNOSIS — M25551 Pain in right hip: Secondary | ICD-10-CM | POA: Diagnosis not present

## 2015-03-15 DIAGNOSIS — G8929 Other chronic pain: Secondary | ICD-10-CM

## 2015-03-15 NOTE — Therapy (Signed)
[place], [name], [address] Phone: [phone]   Fax:  [phone]  Physical Therapy Treatment  Patient Details  Name: Laura Griffin MRN: [id number] Date of Birth: 1945/04/07 Referring Provider: Ardelia Mems  Encounter Date: 03/15/2015      PT End of Session - 03/15/15 1126    Visit Number 9   Number of Visits 16   Date for PT Re-Evaluation 04/07/15   PT Start Time 1105   PT Stop Time 1150   PT Time Calculation (min) 45 min   Activity Tolerance Patient tolerated treatment well;Patient limited by fatigue   Behavior During Therapy [facility] for tasks assessed/performed      Past Medical History  Diagnosis Date  . Left sided sciatica 8/95,11/00    CT confirmed   . Bursitis of shoulder, right 09/19/1999  . Arthritis of knee, right 03/11/2006  . Diverticulosis   . Glaucoma   . Osteoarthritis   . Mitral valve prolapse     per patient heart murmur  . Hypertension   . History of herniated intervertebral disc   . Cervical radiculopathy at C6 05/24/2011    Beginning Nov 2012. Multi-level foraminal narrowing in C-spine plain films   . Sciatica 05/30/2006    Qualifier: Diagnosis of  By: Walker Kehr MD, Wayne  Involved left leg in 02/1999   . DIVERTICULOSIS OF COLON 05/30/2006    Colonoscopy 06/2014 - multiple diverticula, no other abnormalities, no further colonoscopies recommended given patient's age of 11    . Swallowing difficulty 07/06/2013  . Orthostatic hypotension 06/12/2013  . HYPERTENSION, BENIGN SYSTEMIC 05/30/2006        . Allergic rhinitis 07/19/2014  . GERD (gastroesophageal reflux disease) 07/19/2014    Diagnosed clinically by [facility] in January of 2016.    [name], [place] SITES 05/30/2006    She is stoic about pain and wants to avoid surgeries Past xrays have documented DJD in right knee and lumbar spine    . Osteoarthritis of right knee 12/26/2012    Confirmed on xray 07/2014   .  Urge incontinence 06/01/2008    Qualifier: Diagnosis of  By: Walker Kehr MD, Patrick Jupiter    . Pitting edema 07/19/2014    Bilateral 1+ pitting edema of calves first noted on 07/19/2014.    [name] Overweight (BMI 25.0-29.9) 05/30/2006  . LOW BACK PAIN SYNDROME 05/10/2009    Qualifier: Diagnosis of  By: Javier Glazier CMA,, Thekla  Mild C 3-4, 4-5 lumbar spinal stenosis MRI 08/2011, Diffuse lower thoracic and lumbar DDD   . Hearing decreased 08/07/2011    With whooshing tinnitus Mild bilateral loss on 08/2012 audiology who will recheck her hearing in one year      Past Surgical History  Procedure Laterality Date  . Tubal ligation  1973  . Vulva /perineum biopsy  2010    for hypopigmentation, non-specific result  . Cataract extraction  02/2011    left eye    There were no vitals filed for this visit.  Visit Diagnosis:  Pain in hip joint, right  Right-sided low back pain without sciatica  Joint stiffness of spine  Knee pain, chronic, right  Difficulty in walking involving joint of pelvic region and thigh      Subjective Assessment - 03/15/15 1109    Subjective Pt recovering from upper respiratory infection.  No pain in back, some knee pain.  Very mild. Denies current fever, nausea.    Currently in Pain? No/denies  [facility] Adult PT Treatment/Exercise - 03/15/15 1119    Lumbar Exercises: Stretches   Single Knee to Chest Stretch 3 reps;30 seconds   Pelvic Tilt 5 reps;10 seconds   Pelvic Tilt Limitations x 10    Lumbar Exercises: Supine   Clam 10 reps   Bridge 10 reps;5 seconds  HEP   Bridge Limitations small ROM    Straight Leg Raise 10 reps   Straight Leg Raises Limitations difficult for Rt.   HEP   Lumbar Exercises: Sidelying   Hip Abduction 10 reps   Hip Abduction Weights (lbs) HEP   Moist Heat Therapy   Number Minutes Moist Heat 15 Minutes   Moist Heat Location Lumbar Spine                PT Education - 03/15/15 1145    Education provided Yes   Education Details  importance of daily exercise   Person(s) Educated Patient   Methods Explanation   Comprehension Verbalized understanding;Returned demonstration;Need further instruction          PT Short Term Goals - 03/04/15 1033    PT SHORT TERM GOAL #1   Title Pt will be I with initial HEP   Baseline cues needed   Time 4   Period Weeks   Status Achieved   PT SHORT TERM GOAL #2   Title Pt will be able to stand for short periods in  the home and have less pain in Rt. LE (25% less)   Time 4   Period Weeks   Status Achieved   PT SHORT TERM GOAL #3   Title Pt will complete balance screen and set goal if appropriate   Time 4   Period Weeks   Status Achieved   PT SHORT TERM GOAL #4   Title Pt will be able to complete car and sit to stand transfers with less difficulty, pain overall.    Time 4   Period Weeks   Status Achieved           PT Long Term Goals - 03/10/15 1101    PT LONG TERM GOAL #1   Title Pt will be I with more advanced HEP as of last visit.    Status On-going   PT LONG TERM GOAL #2   Title Pt will be I with body mechanics, posture and carry over to functional mobility, squatting.    Status On-going   PT LONG TERM GOAL #3   Title Pt will be able to walk for 20 min and no increase in leg pain   Baseline KNEE pain, not burning   Status On-going   PT LONG TERM GOAL #4   Title Pt will be able to do light housework without increase in pain.   Baseline does minimally and takes lots of breaks, not limited ffrom burning pain    Status Achieved   PT LONG TERM GOAL #5   Title Pt will improve balance score to 48/56 to reduce risk of falls, further disability   Status On-going               Plan - 03/15/15 1144    Clinical Impression Statement Ended session a bit early due to fatigue from recent sickness. No new goals met.    PT Next Visit Plan practice squat-lift, etc. , cont with core/hip strength. FOTO, GCODE   PT Home Exercise Plan tilt, clams, knee to chest. added  seated hamstring and clam with bands bridge, SLR flexion and abd  Consulted and Agree with Plan of Care Patient        Problem List Patient Active Problem List   Diagnosis Date Noted  . Trapezius muscle spasm 01/07/2015  . Allergic rhinitis 07/19/2014  . GERD (gastroesophageal reflux disease) 07/19/2014  . Pitting edema 07/19/2014  . Osteoarthritis of right knee 12/26/2012  . GLAUCOMA 11/04/2009  . LOW BACK PAIN SYNDROME 05/10/2009  . URGE INCONTINENCE 06/01/2008  . Overweight (BMI 25.0-29.9) 05/30/2006  . HYPERTENSION, BENIGN SYSTEMIC 05/30/2006  . Candiss Norse SITES 05/30/2006    Linkoln Alkire 03/15/2015, 11:47 AM  [place], [name], [address] Phone: [phone]   Fax:  [phone]  Name: Laura Griffin MRN: [id number] Date of Birth: 10-Feb-1946    Raeford Razor, PT 03/15/2015 11:47 AM Phone: [phone] Fax: [phone]

## 2015-03-15 NOTE — Patient Instructions (Signed)
Bridge [employer]ose    Press small of back into mat, maintain pelvic tilt, roll up one vertebrae at a time. Focus on engaging posterior hip muscles. Hold for __1-2__ breaths. Repeat __10__ times.  Copyright  VHI. All rights reserved.  Abduction: Side Leg Lift (Eccentric) - Side-Lying    Lie on side. Lift top leg slightly higher than shoulder level. Keep top leg straight with body, toes pointing forward. Slowly lower for 3-5 seconds. _10__ reps per set, __1_ sets per day, __5_ days per week.  http://ecce.exer.us/63   Copyright  VHI. All rights reserved.  Hip Flexion / Knee Extension: Straight-Leg Raise (Eccentric)    Lie on back. Lift leg with knee straight. Slowly lower leg for 3-5 seconds. _10__ reps per set, _1__ sets per day, __5_ days per week. Lower like elevator, stopping at each floor.   Copyright  VHI. All rights reserved.

## 2015-03-17 ENCOUNTER — Ambulatory Visit: Payer: Medicare Other | Admitting: Physical Therapy

## 2015-03-17 DIAGNOSIS — M545 Low back pain, unspecified: Secondary | ICD-10-CM

## 2015-03-17 DIAGNOSIS — M2560 Stiffness of unspecified joint, not elsewhere classified: Secondary | ICD-10-CM

## 2015-03-17 DIAGNOSIS — M25551 Pain in right hip: Secondary | ICD-10-CM | POA: Diagnosis not present

## 2015-03-17 DIAGNOSIS — M25561 Pain in right knee: Secondary | ICD-10-CM | POA: Diagnosis not present

## 2015-03-17 DIAGNOSIS — R262 Difficulty in walking, not elsewhere classified: Secondary | ICD-10-CM

## 2015-03-17 DIAGNOSIS — G8929 Other chronic pain: Secondary | ICD-10-CM

## 2015-03-17 NOTE — Therapy (Signed)
[facility] [address] [place], [place], [address] Phone: [phone]   Fax:  [phone]  Physical Therapy Treatment  Patient Details  Name: Laura Griffin MRN: [id number] Date of Birth: Sep 27, 1945 Referring Provider: Pollie Meyer  Encounter Date: 03/17/2015    Past Medical History  Diagnosis Date  . Left sided sciatica 8/95,11/00    CT confirmed   . Bursitis of shoulder, right 09/19/1999  . Arthritis of knee, right 03/11/2006  . Diverticulosis   . Glaucoma   . Osteoarthritis   . Mitral valve prolapse     per patient heart murmur  . Hypertension   . History of herniated intervertebral disc   . Cervical radiculopathy at C6 05/24/2011    Beginning Nov 2012. Multi-level foraminal narrowing in C-spine plain films   . Sciatica 05/30/2006    Qualifier: Diagnosis of  By: Sheffield Slider MD, Wayne  Involved left leg in 02/1999   . DIVERTICULOSIS OF COLON 05/30/2006    Colonoscopy 06/2014 - multiple diverticula, no other abnormalities, no further colonoscopies recommended given patient's age of 89    . Swallowing difficulty 07/06/2013  . Orthostatic hypotension 06/12/2013  . HYPERTENSION, BENIGN SYSTEMIC 05/30/2006        . Allergic rhinitis 07/19/2014  . GERD (gastroesophageal reflux disease) 07/19/2014    Diagnosed clinically by [facility] in January of 2016.    Esaw Grandchild, MULTI SITES 05/30/2006    She is stoic about pain and wants to avoid surgeries Past xrays have documented DJD in right knee and lumbar spine    . Osteoarthritis of right knee 12/26/2012    Confirmed on xray 07/2014   . Urge incontinence 06/01/2008    Qualifier: Diagnosis of  By: Sheffield Slider MD, Deniece Portela    . Pitting edema 07/19/2014    Bilateral 1+ pitting edema of calves first noted on 07/19/2014.    [name] Overweight (BMI 25.0-29.9) 05/30/2006  . LOW BACK PAIN SYNDROME 05/10/2009    Qualifier: Diagnosis of  By: Lorenda Hatchet CMA,, Thekla  Mild C 3-4, 4-5 lumbar spinal stenosis MRI  08/2011, Diffuse lower thoracic and lumbar DDD   . Hearing decreased 08/07/2011    With whooshing tinnitus Mild bilateral loss on 08/2012 audiology who will recheck her hearing in one year      Past Surgical History  Procedure Laterality Date  . Tubal ligation  1973  . Vulva /perineum biopsy  2010    for hypopigmentation, non-specific result  . Cataract extraction  02/2011    left eye    There were no vitals filed for this visit.  Visit Diagnosis:  Pain in hip joint, right  Right-sided low back pain without sciatica  Joint stiffness of spine  Knee pain, chronic, right  Difficulty in walking involving joint of pelvic region and thigh      Subjective Assessment - 03/17/15 1345    Subjective Feels better today.  No pain.  Would like to continue POC because she feels more confident in her balance.    Currently in Pain? No/denies                         [facility] Adult PT Treatment/Exercise - 03/17/15 1355    Lumbar Exercises: Stretches   Active Hamstring Stretch 2 reps;30 seconds   Pelvic Tilt Other (comment)   Pelvic Tilt Limitations x 10 with ball    Lumbar Exercises: Aerobic   Tread Mill 1.2 mph no incline 5 min, cues to keep hips  forward   Lumbar Exercises: Supine   Heel Slides 10 reps   Heel Slides Limitations on ball    Bridge 10 reps   Bridge Limitations 2 sets, 1 set toes up and 1 set toes out   Large Ball Oblique Isometric 10 reps   Knee/Hip Exercises: Standing   Hip Abduction Stengthening;Both;1 set;10 reps   Abduction Limitations against wall    Knee/Hip Exercises: Prone   Hamstring Curl 1 set;10 reps   Hip Extension Strengthening;Both;1 set;10 reps   Other Prone Exercises quad set x 10 each side    Moist Heat Therapy   Number Minutes Moist Heat 15 Minutes   Moist Heat Location Lumbar Spine                PT Education - 03/17/15 1423    Education provided Yes   Education Details walking on TM   Person(s) Educated Patient   Methods  Explanation   Comprehension Verbalized understanding          PT Short Term Goals - 03/04/15 1033    PT SHORT TERM GOAL #1   Title Pt will be I with initial HEP   Baseline cues needed   Time 4   Period Weeks   Status Achieved   PT SHORT TERM GOAL #2   Title Pt will be able to stand for short periods in  the home and have less pain in Rt. LE (25% less)   Time 4   Period Weeks   Status Achieved   PT SHORT TERM GOAL #3   Title Pt will complete balance screen and set goal if appropriate   Time 4   Period Weeks   Status Achieved   PT SHORT TERM GOAL #4   Title Pt will be able to complete car and sit to stand transfers with less difficulty, pain overall.    Time 4   Period Weeks   Status Achieved           PT Long Term Goals - 03/10/15 1101    PT LONG TERM GOAL #1   Title Pt will be I with more advanced HEP as of last visit.    Status On-going   PT LONG TERM GOAL #2   Title Pt will be I with body mechanics, posture and carry over to functional mobility, squatting.    Status On-going   PT LONG TERM GOAL #3   Title Pt will be able to walk for 20 min and no increase in leg pain   Baseline KNEE pain, not burning   Status On-going   PT LONG TERM GOAL #4   Title Pt will be able to do light housework without increase in pain.   Baseline does minimally and takes lots of breaks, not limited ffrom burning pain    Status Achieved   PT LONG TERM GOAL #5   Title Pt will improve balance score to 48/56 to reduce risk of falls, further disability   Status On-going               Plan - 03/17/15 1405    Clinical Impression Statement Pt reports being "fair" with doing HEP.  Reinforced today.  She does have less pain overall and has built confidence with gait, balance. Knee pain incr with wall slides.    PT Next Visit Plan repeat Berg balance, cont with strengthen.    PT Home Exercise Plan tilt, clams, knee to chest. added seated hamstring and clam with bands  bridge, SLR  flexion and abd   Consulted and Agree with Plan of Care Patient        Problem List Patient Active Problem List   Diagnosis Date Noted  . Trapezius muscle spasm 01/07/2015  . Allergic rhinitis 07/19/2014  . GERD (gastroesophageal reflux disease) 07/19/2014  . Pitting edema 07/19/2014  . Osteoarthritis of right knee 12/26/2012  . GLAUCOMA 11/04/2009  . LOW BACK PAIN SYNDROME 05/10/2009  . URGE INCONTINENCE 06/01/2008  . Overweight (BMI 25.0-29.9) 05/30/2006  . HYPERTENSION, BENIGN SYSTEMIC 05/30/2006  . OSTEOARTHRITIS, MULTI SITES 05/30/2006    Baneen Wieseler 03/17/2015, 2:32 PM  [facility] [address] [place], [place], [address] Phone: [phone]   Fax:  [phone]  Name: Laura Griffin MRN: [id number] Date of Birth: January 12, 1946    Karie Mainland, PT 03/17/2015 2:37 PM Phone: [phone] Fax: [phone]

## 2015-03-21 ENCOUNTER — Ambulatory Visit: Payer: Medicare Other | Admitting: Physical Therapy

## 2015-03-21 DIAGNOSIS — M545 Low back pain, unspecified: Secondary | ICD-10-CM

## 2015-03-21 DIAGNOSIS — M25561 Pain in right knee: Secondary | ICD-10-CM | POA: Diagnosis not present

## 2015-03-21 DIAGNOSIS — M25551 Pain in right hip: Secondary | ICD-10-CM | POA: Diagnosis not present

## 2015-03-21 DIAGNOSIS — G8929 Other chronic pain: Secondary | ICD-10-CM | POA: Diagnosis not present

## 2015-03-21 DIAGNOSIS — R262 Difficulty in walking, not elsewhere classified: Secondary | ICD-10-CM

## 2015-03-21 DIAGNOSIS — M2560 Stiffness of unspecified joint, not elsewhere classified: Secondary | ICD-10-CM

## 2015-03-21 NOTE — Therapy (Signed)
[name], [name], [address] Phone: [phone]   Fax:  [phone]  Physical Therapy Treatment  Patient Details  Name: Laura Griffin MRN: [id number] Date of Birth: 1946/02/14 Referring Provider: Ardelia Mems  Encounter Date: 03/21/2015      PT End of Session - 03/21/15 1144    Visit Number 11   Number of Visits 16   Date for PT Re-Evaluation 04/07/15   PT Start Time 2800   PT Stop Time 1200   PT Time Calculation (min) 55 min   Equipment Utilized During Treatment Gait belt   Activity Tolerance Patient tolerated treatment well   Behavior During Therapy [facility] for tasks assessed/performed      Past Medical History  Diagnosis Date  . Left sided sciatica 8/95,11/00    CT confirmed   . Bursitis of shoulder, right 09/19/1999  . Arthritis of knee, right 03/11/2006  . Diverticulosis   . Glaucoma   . Osteoarthritis   . Mitral valve prolapse     per patient heart murmur  . Hypertension   . History of herniated intervertebral disc   . Cervical radiculopathy at C6 05/24/2011    Beginning Nov 2012. Multi-level foraminal narrowing in C-spine plain films   . Sciatica 05/30/2006    Qualifier: Diagnosis of  By: Walker Kehr MD, Wayne  Involved left leg in 02/1999   . DIVERTICULOSIS OF COLON 05/30/2006    Colonoscopy 06/2014 - multiple diverticula, no other abnormalities, no further colonoscopies recommended given patient's age of 33    . Swallowing difficulty 07/06/2013  . Orthostatic hypotension 06/12/2013  . HYPERTENSION, BENIGN SYSTEMIC 05/30/2006        . Allergic rhinitis 07/19/2014  . GERD (gastroesophageal reflux disease) 07/19/2014    Diagnosed clinically by [facility] in January of 2016.    [name], [place] SITES 05/30/2006    She is stoic about pain and wants to avoid surgeries Past xrays have documented DJD in right knee and lumbar spine    . Osteoarthritis of right knee 12/26/2012    Confirmed  on xray 07/2014   . Urge incontinence 06/01/2008    Qualifier: Diagnosis of  By: Walker Kehr MD, Patrick Jupiter    . Pitting edema 07/19/2014    Bilateral 1+ pitting edema of calves first noted on 07/19/2014.    [name] Overweight (BMI 25.0-29.9) 05/30/2006  . LOW BACK PAIN SYNDROME 05/10/2009    Qualifier: Diagnosis of  By: Javier Glazier CMA,, Thekla  Mild C 3-4, 4-5 lumbar spinal stenosis MRI 08/2011, Diffuse lower thoracic and lumbar DDD   . Hearing decreased 08/07/2011    With whooshing tinnitus Mild bilateral loss on 08/2012 audiology who will recheck her hearing in one year      Past Surgical History  Procedure Laterality Date  . Tubal ligation  1973  . Vulva /perineum biopsy  2010    for hypopigmentation, non-specific result  . Cataract extraction  02/2011    left eye    There were no vitals filed for this visit.  Visit Diagnosis:  Pain in hip joint, right  Right-sided low back pain without sciatica  Joint stiffness of spine  Knee pain, chronic, right  Difficulty in walking involving joint of pelvic region and thigh      Subjective Assessment - 03/21/15 1116    Subjective I had alot of knee pain this weekend and now its a bit better. Pain with walking from my car.    Currently in Pain? Yes  Pain Score 4    Pain Location Knee   Pain Orientation Right   Pain Descriptors / Indicators Aching   Pain Type Chronic pain   Pain Onset More than a month ago   Pain Frequency Intermittent   Aggravating Factors  weather, walking   Pain Relieving Factors heat, rest    Multiple Pain Sites No            [facility] PT Assessment - 03/21/15 1120    Berg Balance Test   Sit to Stand Able to stand without using hands and stabilize independently   Standing Unsupported Able to stand safely 2 minutes   Sitting with Back Unsupported but Feet Supported on Floor or Stool Able to sit safely and securely 2 minutes   Stand to Sit Sits safely with minimal use of hands   Transfers Able to transfer safely, minor use of hands    Standing Unsupported with Eyes Closed Able to stand 10 seconds safely   Standing Ubsupported with Feet Together Able to place feet together independently and stand 1 minute safely   From Standing, Reach Forward with Outstretched Arm Can reach confidently >25 cm (10")   From Standing Position, Pick up Object from Floor Able to pick up shoe, needs supervision   From Standing Position, Turn to Look Behind Over each Shoulder Looks behind from both sides and weight shifts well   Turn 360 Degrees Able to turn 360 degrees safely in 4 seconds or less   Standing Unsupported, Alternately Place Feet on Step/Stool Able to stand independently and complete 8 steps >20 seconds   Standing Unsupported, One Foot in Front Able to plae foot ahead of the other independently and hold 30 seconds   Standing on One Leg Able to lift leg independently and hold 5-10 seconds   Total Score 52   Berg comment: improved 9 pts             [facility] Adult PT Treatment/Exercise - 03/21/15 1144    Ambulation/Gait   Gait Comments worked on backward walking and sidestepping , pt with increased knee pain, discussed IFC and requested   Lumbar Exercises: Aerobic   Stationary Bike NuStep 7 min L 6 UE and LE for endurance   Modalities   Modalities Electrical Stimulation;Moist Heat   Moist Heat Therapy   Number Minutes Moist Heat 15 Minutes   Moist Heat Location Knee   Electrical Stimulation   Electrical Stimulation Location Rt. knee    Electrical Stimulation Action IFC   Electrical Stimulation Parameters to tol 7   Electrical Stimulation Goals Pain                PT Education - 03/21/15 1143    Education provided Yes   Education Details Balance improvement, IFC for pain    Person(s) Educated Patient   Methods Explanation;Demonstration   Comprehension Verbalized understanding;Returned demonstration          PT Short Term Goals - 03/04/15 1033    PT SHORT TERM GOAL #1   Title Pt will be I with initial HEP    Baseline cues needed   Time 4   Period Weeks   Status Achieved   PT SHORT TERM GOAL #2   Title Pt will be able to stand for short periods in  the home and have less pain in Rt. LE (25% less)   Time 4   Period Weeks   Status Achieved   PT SHORT TERM GOAL #3   Title  Pt will complete balance screen and set goal if appropriate   Time 4   Period Weeks   Status Achieved   PT SHORT TERM GOAL #4   Title Pt will be able to complete car and sit to stand transfers with less difficulty, pain overall.    Time 4   Period Weeks   Status Achieved           PT Long Term Goals - 03/21/15 1148    PT LONG TERM GOAL #5   Title Pt will improve balance score to 48/56 to reduce risk of falls, further disability   Status Achieved               Plan - 03/21/15 1147    Clinical Impression Statement Patient with improvement in Berg Balance score 9 pts.  She was pleased.  Walking increased knee pain. No further goals met.         Problem List Patient Active Problem List   Diagnosis Date Noted  . Trapezius muscle spasm 01/07/2015  . Allergic rhinitis 07/19/2014  . GERD (gastroesophageal reflux disease) 07/19/2014  . Pitting edema 07/19/2014  . Osteoarthritis of right knee 12/26/2012  . GLAUCOMA 11/04/2009  . LOW BACK PAIN SYNDROME 05/10/2009  . URGE INCONTINENCE 06/01/2008  . Overweight (BMI 25.0-29.9) 05/30/2006  . HYPERTENSION, BENIGN SYSTEMIC 05/30/2006  . OSTEOARTHRITIS, MULTI SITES 05/30/2006    Joni Colegrove 03/21/2015, 11:50 AM  [place], [name], [address] Phone: [phone]   Fax:  [phone]  Name: Laura Griffin MRN: [id number] Date of Birth: [birth year] Raeford Razor, PT 03/21/2015 11:50 AM Phone: [phone] Fax: [phone]

## 2015-03-22 ENCOUNTER — Other Ambulatory Visit: Payer: Self-pay | Admitting: Family Medicine

## 2015-03-23 ENCOUNTER — Ambulatory Visit: Payer: Medicare Other | Admitting: Physical Therapy

## 2015-03-23 DIAGNOSIS — G8929 Other chronic pain: Secondary | ICD-10-CM

## 2015-03-23 DIAGNOSIS — R262 Difficulty in walking, not elsewhere classified: Secondary | ICD-10-CM

## 2015-03-23 DIAGNOSIS — M2560 Stiffness of unspecified joint, not elsewhere classified: Secondary | ICD-10-CM | POA: Diagnosis not present

## 2015-03-23 DIAGNOSIS — M545 Low back pain, unspecified: Secondary | ICD-10-CM

## 2015-03-23 DIAGNOSIS — M25551 Pain in right hip: Secondary | ICD-10-CM

## 2015-03-23 DIAGNOSIS — M25561 Pain in right knee: Secondary | ICD-10-CM | POA: Diagnosis not present

## 2015-03-23 NOTE — Therapy (Addendum)
[place], [name], [address] Phone: [phone]   Fax:  [phone]  Physical Therapy Treatment  Patient Details  Name: Laura Griffin MRN: [id number] Date of Birth: 1946/01/10 Referring Provider: Ardelia Mems  Encounter Date: 03/23/2015      PT End of Session - 03/23/15 0905    Visit Number 12   Number of Visits 16   Date for PT Re-Evaluation 04/07/15   PT Start Time 0855  10 min late   PT Stop Time 0945   PT Time Calculation (min) 50 min   Activity Tolerance Patient tolerated treatment well   Behavior During Therapy [facility] for tasks assessed/performed      Past Medical History  Diagnosis Date  . Left sided sciatica 8/95,11/00    CT confirmed   . Bursitis of shoulder, right 09/19/1999  . Arthritis of knee, right 03/11/2006  . Diverticulosis   . Glaucoma   . Osteoarthritis   . Mitral valve prolapse     per patient heart murmur  . Hypertension   . History of herniated intervertebral disc   . Cervical radiculopathy at C6 05/24/2011    Beginning Nov 2012. Multi-level foraminal narrowing in C-spine plain films   . Sciatica 05/30/2006    Qualifier: Diagnosis of  By: Walker Kehr MD, Wayne  Involved left leg in 02/1999   . DIVERTICULOSIS OF COLON 05/30/2006    Colonoscopy 06/2014 - multiple diverticula, no other abnormalities, no further colonoscopies recommended given patient's age of 29    . Swallowing difficulty 07/06/2013  . Orthostatic hypotension 06/12/2013  . HYPERTENSION, BENIGN SYSTEMIC 05/30/2006        . Allergic rhinitis 07/19/2014  . GERD (gastroesophageal reflux disease) 07/19/2014    Diagnosed clinically by [facility] in January of 2016.    [name], [place] SITES 05/30/2006    She is stoic about pain and wants to avoid surgeries Past xrays have documented DJD in right knee and lumbar spine    . Osteoarthritis of right knee 12/26/2012    Confirmed on xray 07/2014   . Urge  incontinence 06/01/2008    Qualifier: Diagnosis of  By: Walker Kehr MD, Patrick Jupiter    . Pitting edema 07/19/2014    Bilateral 1+ pitting edema of calves first noted on 07/19/2014.    [name] Overweight (BMI 25.0-29.9) 05/30/2006  . LOW BACK PAIN SYNDROME 05/10/2009    Qualifier: Diagnosis of  By: Javier Glazier CMA,, Thekla  Mild C 3-4, 4-5 lumbar spinal stenosis MRI 08/2011, Diffuse lower thoracic and lumbar DDD   . Hearing decreased 08/07/2011    With whooshing tinnitus Mild bilateral loss on 08/2012 audiology who will recheck her hearing in one year      Past Surgical History  Procedure Laterality Date  . Tubal ligation  1973  . Vulva /perineum biopsy  2010    for hypopigmentation, non-specific result  . Cataract extraction  02/2011    left eye    There were no vitals filed for this visit.  Visit Diagnosis:  Pain in hip joint, right  Right-sided low back pain without sciatica  Joint stiffness of spine  Knee pain, chronic, right  Difficulty in walking involving joint of pelvic region and thigh      Subjective Assessment - 03/23/15 0859    Subjective It was tough getting up today, I think its my last day. Plans to go to senior center and do classes, aquatics.  Liked stim and heat.     Currently in  Pain? No/denies                         [facility] Adult PT Treatment/Exercise - 03/23/15 0902    Lumbar Exercises: Stretches   Pelvic Tilt Other (comment)   Pelvic Tilt Limitations x 10 with ball    Lumbar Exercises: Aerobic   Stationary Bike NuStep 7 min L 6 UE and LE for endurance   Lumbar Exercises: Supine   Glut Set 10 reps;5 seconds   Bridge 10 reps   Lumbar Exercises: Sidelying   Hip Abduction 10 reps   Knee/Hip Exercises: Supine   Quad Sets Strengthening;Both;1 set;10 reps   Moist Heat Therapy   Number Minutes Moist Heat 15 Minutes   Moist Heat Location Knee   Electrical Stimulation   Electrical Stimulation Location Rt. knee    Electrical Stimulation Action IFC   Electrical  Stimulation Parameters level 9   Electrical Stimulation Goals Pain        Seated hamstring stretch x 3 each side 30- sec.         PT Education - 03/23/15 0904    Education provided Yes   Education Details DC plan , HEP   Person(s) Educated Patient   Methods Explanation;Demonstration;Handout   Comprehension Verbalized understanding;Returned demonstration          PT Short Term Goals - 03/23/15 0910    PT SHORT TERM GOAL #1   Title Pt will be I with initial HEP   Status Achieved   PT SHORT TERM GOAL #2   Title Pt will be able to stand for short periods in  the home and have less pain in Rt. LE (25% less)   Status Achieved   PT SHORT TERM GOAL #3   Title Pt will complete balance screen and set goal if appropriate   Status Achieved   PT SHORT TERM GOAL #4   Title Pt will be able to complete car and sit to stand transfers with less difficulty, pain overall.    Status Achieved           PT Long Term Goals - 03/23/15 0907    PT LONG TERM GOAL #1   Title Pt will be I with more advanced HEP as of last visit.    Status Achieved   PT LONG TERM GOAL #2   Title Pt will be I with body mechanics, posture and carry over to functional mobility, squatting.    Status Achieved   PT LONG TERM GOAL #3   Title Pt will be able to walk for 20 min and no increase in leg pain   Status Achieved   PT LONG TERM GOAL #4   Title Pt will be able to do light housework without increase in pain.   Status Achieved   PT LONG TERM GOAL #5   Title Pt will improve balance score to 48/56 to reduce risk of falls, further disability   Status Achieved               Plan - 03/23/15 0906    Clinical Impression Statement Patient has met all LTG and is pleased with her progress. She will continue to work on her strength and mobility at the [place].     PT Next Visit Plan NA, DC   PT Home Exercise Plan tilt, clams, knee to chest. added seated hamstring and clam with bands bridge, SLR flexion  and abd   Consulted and Agree with  Plan of Care Patient          G-Codes - 2015-03-26 1041    Functional Assessment Tool Used clinical judgement   Functional Limitation Mobility: Walking and moving around   Mobility: Walking and Moving Around Current Status [phone]) At least 20 percent but less than 40 percent impaired, limited or restricted   Mobility: Walking and Moving Around Goal Status [phone]) At least 20 percent but less than 40 percent impaired, limited or restricted   Mobility: Walking and Moving Around Discharge Status [phone]) At least 20 percent but less than 40 percent impaired, limited or restricted      Problem List Patient Active Problem List   Diagnosis Date Noted  . Trapezius muscle spasm 01/07/2015  . Allergic rhinitis 07/19/2014  . GERD (gastroesophageal reflux disease) 07/19/2014  . Pitting edema 07/19/2014  . Osteoarthritis of right knee 12/26/2012  . GLAUCOMA 11/04/2009  . LOW BACK PAIN SYNDROME 05/10/2009  . URGE INCONTINENCE 06/01/2008  . Overweight (BMI 25.0-29.9) 05/30/2006  . HYPERTENSION, BENIGN SYSTEMIC 05/30/2006  . Candiss Norse SITES 05/30/2006    Monesha Monreal 03/26/15, 10:42 AM  [facility] [address] [place], [name], [address] Phone: [phone]   Fax:  [phone]  Name: Laura Griffin MRN: [id number] Date of Birth: [birth year]  PHYSICAL THERAPY DISCHARGE SUMMARY  Visits from Start of Care: 12  Current functional level related to goals / functional outcomes: See above    Remaining deficits: Pain in knee, endurance and hip strength (gen 3+/5 to 4/5)   Education / Equipment: HEP, post rehab plan and lifting, posture  Plan: Patient agrees to discharge.  Patient goals were met. Patient is being discharged due to meeting the stated rehab goals.  ?????   Raeford Razor, PT March 26, 2015 10:42 AM Phone: [phone] Fax: [phone]

## 2015-04-05 DIAGNOSIS — J387 Other diseases of larynx: Secondary | ICD-10-CM | POA: Diagnosis not present

## 2015-04-05 DIAGNOSIS — R05 Cough: Secondary | ICD-10-CM | POA: Diagnosis not present

## 2015-05-05 ENCOUNTER — Encounter: Payer: Self-pay | Admitting: Family Medicine

## 2015-05-05 ENCOUNTER — Ambulatory Visit (INDEPENDENT_AMBULATORY_CARE_PROVIDER_SITE_OTHER): Payer: Medicare Other | Admitting: Family Medicine

## 2015-05-05 VITALS — BP 145/59 | HR 51 | Temp 97.8°F | Ht 64.0 in | Wt 185.0 lb

## 2015-05-05 DIAGNOSIS — K219 Gastro-esophageal reflux disease without esophagitis: Secondary | ICD-10-CM

## 2015-05-05 DIAGNOSIS — E049 Nontoxic goiter, unspecified: Secondary | ICD-10-CM

## 2015-05-05 DIAGNOSIS — F450 Somatization disorder: Secondary | ICD-10-CM | POA: Diagnosis not present

## 2015-05-05 DIAGNOSIS — F458 Other somatoform disorders: Secondary | ICD-10-CM | POA: Diagnosis not present

## 2015-05-05 DIAGNOSIS — R1310 Dysphagia, unspecified: Secondary | ICD-10-CM

## 2015-05-05 DIAGNOSIS — J309 Allergic rhinitis, unspecified: Secondary | ICD-10-CM | POA: Diagnosis not present

## 2015-05-05 DIAGNOSIS — R0989 Other specified symptoms and signs involving the circulatory and respiratory systems: Secondary | ICD-10-CM | POA: Insufficient documentation

## 2015-05-05 DIAGNOSIS — I10 Essential (primary) hypertension: Secondary | ICD-10-CM | POA: Diagnosis not present

## 2015-05-05 LAB — CBC
HCT: 36.9 % (ref 36.0–46.0)
Hemoglobin: 12.2 g/dL (ref 12.0–15.0)
MCH: 29.7 pg (ref 26.0–34.0)
MCHC: 33.1 g/dL (ref 30.0–36.0)
MCV: 89.8 fL (ref 78.0–100.0)
MPV: 9.5 fL (ref 8.6–12.4)
PLATELETS: 322 10*3/uL (ref 150–400)
RBC: 4.11 MIL/uL (ref 3.87–5.11)
RDW: 13.7 % (ref 11.5–15.5)
WBC: 4.7 10*3/uL (ref 4.0–10.5)

## 2015-05-05 LAB — COMPLETE METABOLIC PANEL WITH GFR
ALT: 25 U/L (ref 6–29)
AST: 29 U/L (ref 10–35)
Albumin: 4.5 g/dL (ref 3.6–5.1)
Alkaline Phosphatase: 86 U/L (ref 33–130)
BILIRUBIN TOTAL: 0.6 mg/dL (ref 0.2–1.2)
BUN: 19 mg/dL (ref 7–25)
CO2: 29 mmol/L (ref 20–31)
CREATININE: 1.06 mg/dL — AB (ref 0.60–0.93)
Calcium: 10.2 mg/dL (ref 8.6–10.4)
Chloride: 104 mmol/L (ref 98–110)
GFR, EST NON AFRICAN AMERICAN: 53 mL/min — AB (ref >=60)
GFR, Est African American: 61 mL/min (ref >=60)
GLUCOSE: 96 mg/dL (ref 65–99)
Potassium: 3.9 mmol/L (ref 3.5–5.3)
SODIUM: 142 mmol/L (ref 135–146)
TOTAL PROTEIN: 7.7 g/dL (ref 6.1–8.1)

## 2015-05-05 LAB — LIPID PANEL
CHOL/HDL RATIO: 3.5 ratio (ref <=5.0)
Cholesterol: 173 mg/dL (ref 125–200)
HDL: 50 mg/dL (ref >=46)
LDL Cholesterol: 96 mg/dL (ref <130)
Triglycerides: 133 mg/dL (ref <150)
VLDL: 27 mg/dL (ref <30)

## 2015-05-05 LAB — TSH: TSH: 1.638 u[IU]/mL (ref 0.350–4.500)

## 2015-05-05 MED ORDER — FLUTICASONE PROPIONATE 50 MCG/ACT NA SUSP: 2.0000 | Freq: Every day | NASAL | Status: DC

## 2015-05-05 NOTE — Patient Instructions (Signed)
It was nice to see you today.  Feeling of something in your throat - check ultrasound of the neck to evaluate your thyroid  Runny nose/congestion - start Flonase daily  Trouble swollowing - make a follow up appointment with Dr. Matthias Hughs.   Dr. Randolm Idol will call you with your lab work.

## 2015-05-05 NOTE — Assessment & Plan Note (Signed)
Patient continues to have symptoms of allergic rhinitis. - trial of Flonase daily

## 2015-05-05 NOTE — Assessment & Plan Note (Signed)
Patient reports globus sensation and has mildly enlarged thyroid gland on exam. - check ultrasound of head and neck

## 2015-05-05 NOTE — Progress Notes (Signed)
   Subjective:    Patient ID: Laura Griffin, female    DOB: 07-03-1945, 70 y.o.   MRN: [id number]  HPI 70 y/o female presents for   Dysphagia/GERD - recently seen by GI physician (Buccini) - did not recommend EGD, recommended better compliance with Protonix (was not taking 30 minutes before eating and missing doses), has been better about taking twice daily as prescribed, she has noticed improvement in GERD (now rarely having acid reflux).   Seen by speech in 09/2015 - normal oropharyngeal swallow function, suspected dysphagia   Now reports pain over anterior neck (left side > right), no mass, also sore mouth/tongue, no nausea, no emesis, no fevers, no night sweats, does have cough that is new/some wheezing/nonproductive and dry, does have a tickle in her throat. Also has frequent runny nose/nasal congestion (did have ?flu/virus around Christmas time, improved with otc medications and Tylenol). Has never taken any allergic rhinitis medications. Has trouble swallowing solid foods, occasionally trouble swallowing liquids (both solids and liquids are infrequent). Does feel globus sensation with swallowing.   Social - lives with husband, nonsmoker    Review of Systems See Above    Objective:   Physical Exam Filed Vitals:   05/05/15 0852  BP: 145/59  Pulse: 51  Temp: 97.8 F (36.6 C)   Neuro: Pleasant female, no acute distress Cardiac: Regular rate and rhythm, S1 and S2 present, no murmurs, no heaves or thrills Respiratory: Clear to auscultation bilaterally, normal effort HEENT: Normocephalic, pupils equal round and reactive to light, extraocular movements are intact, no scleral icterus, moist mucous membranes, uvula midline, mild enlargement of the right thyroid gland, no cervical adenopathy however patient did have tenderness of the left submental area, bilateral TMs are pearly-gray Abdomen: Soft, nontender, bowel sounds present  Reviewed previous speech therapy notes.       Assessment & Plan:  Swallowing difficulty Patient continues to have swallowing difficulty associated with both liquids and solids. Likely multifactorial from GERD, allergic rhinitis, possible thyroid enlargement (see individual assessment and plan).  No previous oropharyngeal dysfunction on speech therapy reevaluation in 2016. - patient encouraged to return to GI physician for EGD to evaluate esophagus. - continue individual treatment for GERD and allergic rhinitis  Allergic rhinitis  Patient continues to have symptoms of allergic rhinitis. - trial of Flonase daily  GERD (gastroesophageal reflux disease) Improved with better compliance on taking Protonix. - continue Protonix daily  Globus sensation Patient reports globus sensation and has mildly enlarged thyroid gland on exam. - check ultrasound of head and neck

## 2015-05-05 NOTE — Assessment & Plan Note (Signed)
Improved with better compliance on taking Protonix. - continue Protonix daily

## 2015-05-05 NOTE — Assessment & Plan Note (Signed)
Patient continues to have swallowing difficulty associated with both liquids and solids. Likely multifactorial from GERD, allergic rhinitis, possible thyroid enlargement (see individual assessment and plan).  No previous oropharyngeal dysfunction on speech therapy reevaluation in 2016. - patient encouraged to return to GI physician for EGD to evaluate esophagus. - continue individual treatment for GERD and allergic rhinitis

## 2015-05-09 ENCOUNTER — Telehealth: Payer: Self-pay | Admitting: Family Medicine

## 2015-05-09 NOTE — Telephone Encounter (Signed)
Attempted to contact patient, no answer on cell phone, will call again later today or tomorrow.

## 2015-05-11 NOTE — Telephone Encounter (Signed)
Discussed lab results with patient. Cr slightly elevated to 1.06 (will recheck in 6-12 months) ASCVD 13% - discussed pros/cons of starting statin, patient elected to hold on this medication at this time, discussed importance of heathy diet and regular exercise.

## 2015-05-16 ENCOUNTER — Ambulatory Visit (HOSPITAL_COMMUNITY): Payer: Medicare Other

## 2015-05-23 ENCOUNTER — Ambulatory Visit (HOSPITAL_COMMUNITY)
Admission: RE | Admit: 2015-05-23 | Discharge: 2015-05-23 | Disposition: A | Discharge status: Home / Self Care | Payer: Medicare Other | Source: Ambulatory Visit | Attending: Family Medicine | Admitting: Family Medicine

## 2015-05-23 DIAGNOSIS — J309 Allergic rhinitis, unspecified: Secondary | ICD-10-CM | POA: Diagnosis not present

## 2015-05-23 DIAGNOSIS — E041 Nontoxic single thyroid nodule: Secondary | ICD-10-CM

## 2015-05-23 DIAGNOSIS — E049 Nontoxic goiter, unspecified: Secondary | ICD-10-CM | POA: Diagnosis present

## 2015-05-24 ENCOUNTER — Telehealth: Payer: Self-pay | Admitting: Family Medicine

## 2015-05-24 DIAGNOSIS — E041 Nontoxic single thyroid nodule: Secondary | ICD-10-CM | POA: Insufficient documentation

## 2015-05-24 NOTE — Telephone Encounter (Signed)
Discussed thyroid US results with patient, small left sided nodule (needs follow up US in 12 months, no need for biopsy at this time).  Patient encouraged to follow up with GI for possible EGD to evaluate swallowing.

## 2015-06-02 ENCOUNTER — Other Ambulatory Visit: Payer: Self-pay | Admitting: *Deleted

## 2015-06-02 MED ORDER — LOSARTAN POTASSIUM 100 MG PO TABS: 100.0000 mg | ORAL_TABLET | Freq: Every day | ORAL | Status: DC

## 2015-06-09 ENCOUNTER — Encounter: Payer: Self-pay | Admitting: *Deleted

## 2015-07-18 ENCOUNTER — Other Ambulatory Visit: Payer: Self-pay | Admitting: Family Medicine

## 2015-07-25 ENCOUNTER — Other Ambulatory Visit: Payer: Self-pay | Admitting: Family Medicine

## 2015-07-25 DIAGNOSIS — Z1231 Encounter for screening mammogram for malignant neoplasm of breast: Secondary | ICD-10-CM | POA: Diagnosis not present

## 2015-08-25 DIAGNOSIS — J387 Other diseases of larynx: Secondary | ICD-10-CM | POA: Diagnosis not present

## 2015-08-25 DIAGNOSIS — R05 Cough: Secondary | ICD-10-CM | POA: Diagnosis not present

## 2015-09-16 ENCOUNTER — Ambulatory Visit (INDEPENDENT_AMBULATORY_CARE_PROVIDER_SITE_OTHER): Payer: Medicare Other | Admitting: Family Medicine

## 2015-09-16 ENCOUNTER — Encounter: Payer: Self-pay | Admitting: Family Medicine

## 2015-09-16 DIAGNOSIS — N3941 Urge incontinence: Secondary | ICD-10-CM

## 2015-09-16 DIAGNOSIS — I10 Essential (primary) hypertension: Secondary | ICD-10-CM

## 2015-09-16 LAB — POCT URINALYSIS DIPSTICK
Bilirubin, UA: NEGATIVE
GLUCOSE UA: NEGATIVE
KETONES UA: NEGATIVE
Leukocytes, UA: NEGATIVE
Nitrite, UA: NEGATIVE
Protein, UA: NEGATIVE
RBC UA: NEGATIVE
SPEC GRAV UA: 1.01
UROBILINOGEN UA: 0.2
pH, UA: 6

## 2015-09-16 LAB — COMPLETE METABOLIC PANEL WITH GFR
ALBUMIN: 4.7 g/dL (ref 3.6–5.1)
ALK PHOS: 80 U/L (ref 33–130)
ALT: 27 U/L (ref 6–29)
AST: 32 U/L (ref 10–35)
BUN: 18 mg/dL (ref 7–25)
CO2: 28 mmol/L (ref 20–31)
Calcium: 10.4 mg/dL (ref 8.6–10.4)
Chloride: 102 mmol/L (ref 98–110)
Creat: 0.88 mg/dL (ref 0.60–0.93)
GFR, EST AFRICAN AMERICAN: 76 mL/min (ref >=60)
GFR, EST NON AFRICAN AMERICAN: 66 mL/min (ref >=60)
GLUCOSE: 86 mg/dL (ref 65–99)
POTASSIUM: 3.9 mmol/L (ref 3.5–5.3)
SODIUM: 140 mmol/L (ref 135–146)
Total Bilirubin: 0.6 mg/dL (ref 0.2–1.2)
Total Protein: 8 g/dL (ref 6.1–8.1)

## 2015-09-16 MED ORDER — HYDROCHLOROTHIAZIDE 25 MG PO TABS: 25.0000 mg | ORAL_TABLET | Freq: Every day | ORAL | Status: DC

## 2015-09-16 NOTE — Patient Instructions (Signed)
It was nice to see you today.  BP - increase HCTZ to 25 mg daily, Please come back for a nurse BP check in one week, follow up with Dr. Randolm IdolFletke in 2-3 weeks.  Urge Incontinence - check urine and labs today, Dr. Randolm IdolFletke will call you with your results.

## 2015-09-16 NOTE — Assessment & Plan Note (Signed)
Patient having continued symptoms consistent with urge incontinence.  -Check urine to rule of UTI and labs to evaluate renal function -Call back with results -consider initiation of urge incontinence medication

## 2015-09-16 NOTE — Progress Notes (Deleted)
   Subjective:    Patient ID: Laura Griffin, female    DOB: Nov 27, 1945, 70 y.o.   MRN: [id number]  HPI  Increased urinary frequency - some associated incontinency, denies incontinece with cough/stress, no dysuria, no fevers, incontinency occurs when she can not delay her bladder emptying, also has been drinking a lot of water recently (? Causing symtpoms)  Mammogram - had completed in April  FM Breast Cancer - Sister, Celine Ahrunt (paternal aunt), [facility]  Review of Systems     Objective:   Physical Exam        Assessment & Plan:

## 2015-09-16 NOTE — Assessment & Plan Note (Signed)
Elevated today @ 197/98. No signs/symptoms of end organ damage.  -Recommend increasing HCTZ to 25 MG daily -Have pt return in one week for nurse BP check -Follow up in 2-3 weeks  -check BMP to monitor renal function

## 2015-09-16 NOTE — Progress Notes (Signed)
Subjective:     Patient ID: Laura Griffin, female   DOB: 20-Sep-1945, 70 y.o.   MRN: [id number]  HPI [name] is a 70 y.o. Female who presents with 7-10 day history of HTN.   Acute Concerns:   Hypertension: Has noticed elevated blood pressure for the past 7-10 days and is very concerned. Normal blood pressure is ~130/80 but has recently been consistently running higher than 190 systolic and 90 diastolic. Mother passed away at age 72 from hemorrhagic stroke. She takes all of her medications "religiously" which is part of her concern. Has gained some weight recently. Says her osteoarthritic right knee has been causing her more trouble than usual for the past 7-10 days.  Feels asymptomatic otherwise. Denies headache, chest pain, SOB, vision loss, motor loss, or loss of sensation. Currently taking Toprol 100 mg daily, Losartan 100 mg daily, and HCTZ 12.5 mg daily.   Incontinence: Increased urinary frequency with some associated incontinence. Feels like she cannot delay bladder emptying. Has been drinking a lot of water lately. Produces full stream and denies dribbling. Denies incontinence with stress or cough. Denies dysuria or fever.   Health Maintenance: Completed mammogram 07/2015. Sister and Celine Ahrunt passed away from breast cancer with another sister being treated currently.   Social - nonsmoker    Review of Systems Normal other than stated above.     Objective:   Physical Exam Filed Vitals:   09/16/15 1023  BP: 197/98  Pulse: 66  Temp: 97.6 F (36.4 C)   Blood pressure 197/98, pulse 66, temperature 97.6 F (36.4 C), temperature source Oral, height 5\' 4"  (1.626 m), weight 190 lb 3.2 oz (86.274 kg), last menstrual period 04/02/1996.  Gen: Healthy, non-distressed, pleasant disposition.  HEENT: MMM, normocephalic.  Pulm: CTAB, no labored breathing. CV: RRR, no MRG noted on exam.    Assessment and Plan:     [name] is a 70 y.o. Female who presents with 7-10 day  history of HTN.  HYPERTENSION, BENIGN SYSTEMIC Elevated today @ 197/98. No signs/symptoms of end organ damage.  -Recommend increasing HCTZ to 25 MG daily -Have pt return in one week for nurse BP check -Follow up in 2-3 weeks  -check BMP to monitor renal function  URGE INCONTINENCE Patient having continued symptoms consistent with urge incontinence.  -Check urine to rule of UTI and labs to evaluate renal function -Call back with results -consider initiation of urge incontinence medication  Encouraged patient to follow up with Sports Medicine for further evaluation of right knee OA.

## 2015-09-19 ENCOUNTER — Other Ambulatory Visit: Payer: Self-pay | Admitting: Family Medicine

## 2015-09-19 ENCOUNTER — Telehealth: Payer: Self-pay | Admitting: Family Medicine

## 2015-09-19 DIAGNOSIS — N3941 Urge incontinence: Secondary | ICD-10-CM

## 2015-09-19 MED ORDER — MIRABEGRON ER 25 MG PO TB24: 25.0000 mg | ORAL_TABLET | Freq: Every day | ORAL | Status: DC

## 2015-09-19 NOTE — Telephone Encounter (Signed)
Called patient to discuss lab results.  UA negative for infection. BMP unremarkable.  -start trial of Myrbetriq for treatment of suspected urge incontinence.

## 2015-09-19 NOTE — Assessment & Plan Note (Signed)
UA negative for infection. BMP unremarkable.  -start trial of Myrbetriq for treatment of suspected urge incontinence.

## 2015-09-23 ENCOUNTER — Ambulatory Visit (INDEPENDENT_AMBULATORY_CARE_PROVIDER_SITE_OTHER): Payer: Medicare Other | Admitting: *Deleted

## 2015-09-23 VITALS — BP 138/80 | HR 60

## 2015-09-23 DIAGNOSIS — Z136 Encounter for screening for cardiovascular disorders: Secondary | ICD-10-CM

## 2015-09-23 DIAGNOSIS — Z0130 Encounter for examination of blood pressure without abnormal findings: Secondary | ICD-10-CM

## 2015-09-23 NOTE — Progress Notes (Signed)
   Patient in nurse clinic for blood pressure check.  Patient denies any chest pain, SOB, dizziness, headache or visual changes.  Patient is taking medications as prescribed.  Patient has follow up appointment in two weeks with PCP.     Today's Vitals   09/23/15 0917  BP: 138/80  Pulse: 60  SpO2: 96%  PainSc: 0-No pain  Clovis PuMartin, Teyton Pattillo L, RN

## 2015-09-26 DIAGNOSIS — E119 Type 2 diabetes mellitus without complications: Secondary | ICD-10-CM | POA: Diagnosis not present

## 2015-09-26 DIAGNOSIS — H524 Presbyopia: Secondary | ICD-10-CM | POA: Diagnosis not present

## 2015-10-07 ENCOUNTER — Ambulatory Visit (INDEPENDENT_AMBULATORY_CARE_PROVIDER_SITE_OTHER): Payer: Medicare Other | Admitting: Family Medicine

## 2015-10-07 ENCOUNTER — Encounter: Payer: Self-pay | Admitting: Family Medicine

## 2015-10-07 VITALS — BP 142/60 | HR 53 | Temp 98.6°F | Wt 189.4 lb

## 2015-10-07 DIAGNOSIS — I10 Essential (primary) hypertension: Secondary | ICD-10-CM

## 2015-10-07 DIAGNOSIS — N3941 Urge incontinence: Secondary | ICD-10-CM | POA: Diagnosis not present

## 2015-10-07 DIAGNOSIS — H9192 Unspecified hearing loss, left ear: Secondary | ICD-10-CM

## 2015-10-07 DIAGNOSIS — M5136 Other intervertebral disc degeneration, lumbar region: Secondary | ICD-10-CM | POA: Diagnosis not present

## 2015-10-07 DIAGNOSIS — H9190 Unspecified hearing loss, unspecified ear: Secondary | ICD-10-CM | POA: Insufficient documentation

## 2015-10-07 MED ORDER — AMLODIPINE BESYLATE 5 MG PO TABS: 5.0000 mg | ORAL_TABLET | Freq: Every day | ORAL | Status: DC

## 2015-10-07 NOTE — Patient Instructions (Addendum)
It was nice to see you today.   Mammogram - please call [facility] and have them send me a copy. Thank you.  Blood Pressure - add Norvasc 5 mg daily, continue current dose of Losartan, Toprol, and HCTZ.    Back pain - continue tylenol 3 times per day, apply heat to the affected area 2-3 times per day  Urge Incontinence - I am glad to hear that this has improved, we will continue to monitor this.   Please return in 2 weeks for recheck of blood pressure.

## 2015-10-07 NOTE — Assessment & Plan Note (Signed)
BP today 142/60 but patient recorded a high of 197/101 at home several days prior despite medication aderence. No signs/symptoms of end organ damage.  -start amlodipine 5mg  -continue current regimen of HCTZ 25mg , Losartan 100mg , and Metoprolol 100mg  -follow up in 2 weeks

## 2015-10-07 NOTE — Progress Notes (Deleted)
Subjective:     Patient ID: Laura Griffin, female   DOB: 06-09-1945, 70 y.o.   MRN: [id number]  HPI  [name] is a 70 year-old female who presents to clinic for follow up of hypertension and urinary incontinence.   Acute Concerns:   Hypertension: Has noticed elevated blood pressure for the past several months and is very concerned. Normal blood pressure is ~130/80 but has recently been consistently running higher than 190 systolic and 90 diastolic despite being compliant with her antihypertensive medications. Mother passed away at age 72 from hemorrhagic stroke. The patient has gained approximately 10lbs over the last year. Says her osteoarthritic right knee has been causing her more trouble than usual for the past 7-10 days. Feels asymptomatic otherwise. Denies headache, chest pain, SOB, vision loss, motor loss, or loss of sensation. Currently taking Toprol 100 mg daily, Losartan 100 mg daily, and HCTZ 25 mg daily (increased from 12.5 on last visit).   Incontinence: Patient reports that her urinary incontinence has improved on the Myrbetriq 25mg  and no longer feels 'sudden pressure' before going to the restroom. She does state that she still urinates frequently as a result of the diuretic medication. She denies any hematuria, vaginal discharge, dysuria.  Chronic Back Pain: The patient reports that she has several days of lower back pain localized to the left paraspinal muscles. This is in the setting of chronic back pain, as the patient had a ruptured disc many years ago (1990s) and did not want to undergo surgery. Her back pain has been managed with tylenol tid that she takes for arthritis. She states that her pain improves with leaning over and worsens with straightening or standing up.    Hearing Loss: Has noticed decreased hearing bilaterally over the last several years and more pronounced in the left. She reports difficulty hearing her husband and that she now listens to the television  at "full volume". The patient reports hearing an intermittent "drone" or "freight train" sound.  Health Maintenance: Completed mammogram 07/2015 with [facility] (will follow up for results) Social - nonsmoker   Review of Systems Pertinent positives and negative per above in HPI, otherwise negative.      Objective:   Physical Exam  BP 142/60 mmHg  Pulse 53  Temp(Src) 98.6 F (37 C) (Oral)  Wt 189 lb 6.4 oz (85.911 kg)  LMP 04/02/1996 Gen: Pleasant-appearing elderly woman, NAD HEENT: Normocephalic, MMM. PERRL. EOMI. Sclera anicteric. Tympanic membranes visualized bilaterally Cardiac: RRR. S1 S2 nl. No murmurs on auscultation Pulm: CTAB. Normal work of breathing. MSK: Mild tenderness to palpation in left lower back. Strength and sensation appropriate throughout.    Assessment and Plan:   [name] is a 70 year-old female who presents to clinic for follow up of hypertension and urinary incontinence.  HYPERTENSION, BENIGN SYSTEMIC BP today 142/60 but patient recorded a high of 197/101 at home several days prior despite medication aderence. No signs/symptoms of end organ damage.  -start amlodipine 5mg  -continue current regimen of HCTZ 25mg , Losartan 100mg , and Metoprolol 100mg  -follow up in 2 weeks  URGE INCONTINENCE Improvement of symptoms with medication -continue Myrbetriq  CHRONIC BACK PAIN Several days of localized left lower back pain likely due to inflamed paraspinal muscles. -continue tylenol  -recommend heating pad -follow up if no relief/worsening of symptoms  HEARING LOSS Ears normal on physical exam -refer to audiology for exam

## 2015-10-07 NOTE — Assessment & Plan Note (Signed)
Ears normal on physical exam -refer to audiology for exam

## 2015-10-07 NOTE — Progress Notes (Signed)
   Subjective:    Patient ID: Laura Griffin, female    DOB: [birth year], 70 y.o.   MRN: [id number]  HPI [name] is a 70 year-old female who presents to clinic for follow up of hypertension and urinary incontinence.    Hypertension: Has noticed elevated blood pressure for the past several months and is very concerned. Normal blood pressure is ~130/80 but has recently been consistently running higher than 190 systolic and 90 diastolic despite being compliant with her antihypertensive medications. Mother passed away at age 72 from hemorrhagic stroke. The patient has gained approximately 10lbs over the last year.  Denies headache, chest pain, SOB, vision loss, motor loss, or loss of sensation. Currently taking Toprol 100 mg daily, Losartan 100 mg daily, and HCTZ 25 mg daily (increased from 12.5 on last visit).   Incontinence: Patient reports that her urinary incontinence has improved on the Myrbetriq 25mg  and no longer feels 'sudden pressure' before going to the restroom. She does state that she still urinates frequently as a result of the diuretic medication. She denies any hematuria, vaginal discharge, dysuria.  Chronic Back Pain: The patient reports that she has several days of lower back pain localized to the left paraspinal muscles. This is in the setting of chronic back pain, as the patient had a ruptured disc many years ago (1990s) and did not want to undergo surgery. Her back pain has been managed with tylenol tid that she takes for arthritis. She states that her pain improves with leaning over and worsens with straightening or standing up. No fevers, no saddle anesthesia.    Hearing Loss: Has noticed decreased hearing bilaterally over the last several years and more pronounced in the left. She reports difficulty hearing her husband and that she now listens to the television at "full volume". The patient reports hearing an intermittent "drone" or "freight train" sound.  Health  Maintenance: Completed mammogram 07/2015 with [facility] (will follow up for results)  Social - nonsmoker   Review of Systems  Constitutional: Negative for fever, chills and fatigue.  Respiratory: Negative for cough, choking and shortness of breath.   Gastrointestinal: Negative for nausea, vomiting and diarrhea.       Objective:   Physical Exam BP 142/60 mmHg  Pulse 53  Temp(Src) 98.6 F (37 C) (Oral)  Wt 189 lb 6.4 oz (85.911 kg)  LMP 04/02/1996  Gen: Pleasant-appearing elderly woman, NAD HEENT: Normocephalic, MMM. PERRL. EOMI. Sclera anicteric. Tympanic pearly grey bilaterally Cardiac: RRR. S1 S2 nl. No murmurs on auscultation Pulm: CTAB. Normal work of breathing. MSK: Mild tenderness to palpation in left lower back. SLT negative bilaterally Neuro: Strength bilateral LE 5/5, sensation to light touch intact      Assessment & Plan:  HYPERTENSION, BENIGN SYSTEMIC BP today 142/60 but patient recorded a high of 197/101 at home several days prior despite medication aderence. No signs/symptoms of end organ damage.  -start amlodipine 5mg  -continue current regimen of HCTZ 25mg , Losartan 100mg , and Metoprolol 100mg  -follow up in 2 weeks  URGE INCONTINENCE Improvement of symptoms with medication -continue Myrbetriq  DDD (degenerative disc disease), lumbar Several days of localized left lower back pain likely due to inflamed paraspinal muscles. -continue tylenol  -recommend heating pad -follow up if no relief/worsening of symptoms  Hearing loss Ears normal on physical exam -refer to audiology for exam

## 2015-10-07 NOTE — Assessment & Plan Note (Signed)
Improvement of symptoms with medication -continue Myrbetriq

## 2015-10-07 NOTE — Assessment & Plan Note (Signed)
Several days of localized left lower back pain likely due to inflamed paraspinal muscles. -continue tylenol  -recommend heating pad -follow up if no relief/worsening of symptoms

## 2015-10-10 IMAGING — DX DG KNEE COMPLETE 4+V*R*
3 series · 3 of 3 positions shown · non-contrast
Comparison: 03/04/2006

CLINICAL DATA: Anterior right knee pain.  Osteoarthritis.

EXAM:
RIGHT KNEE - COMPLETE 4+ VIEW

[knee ap]
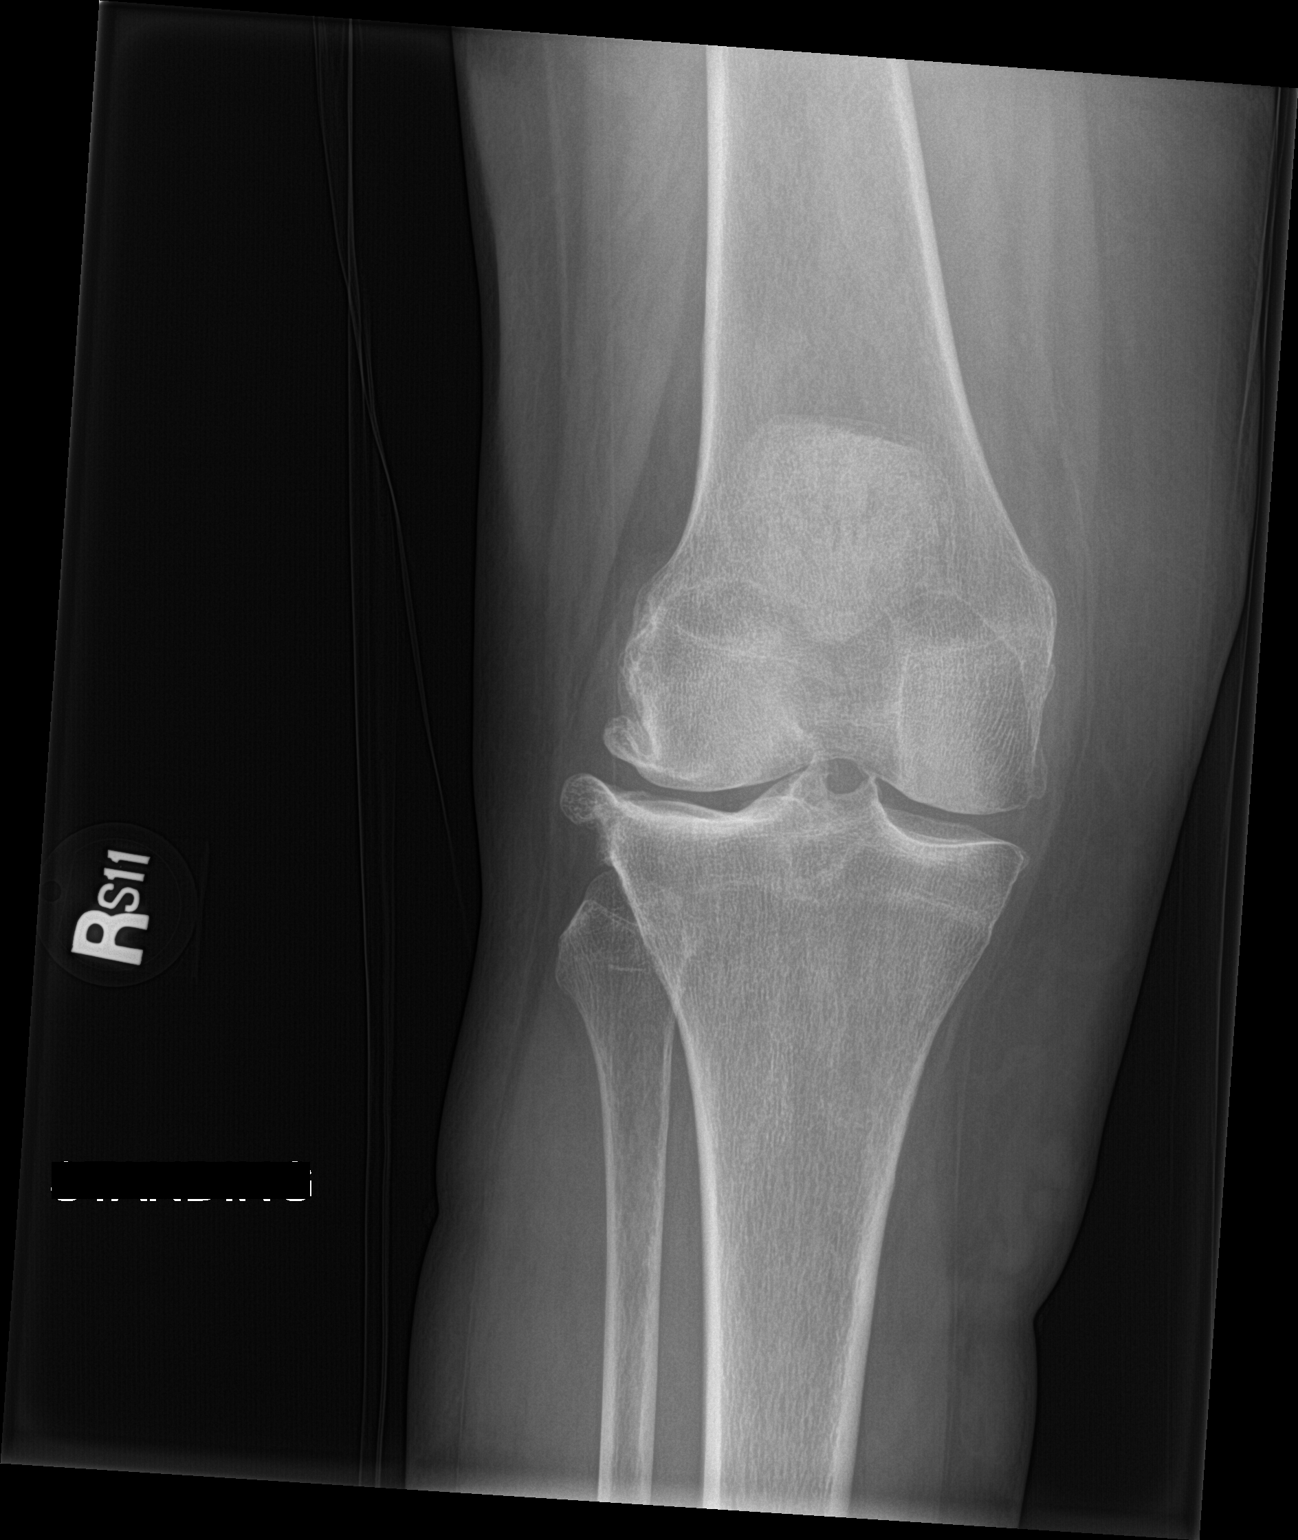

[knee lat]
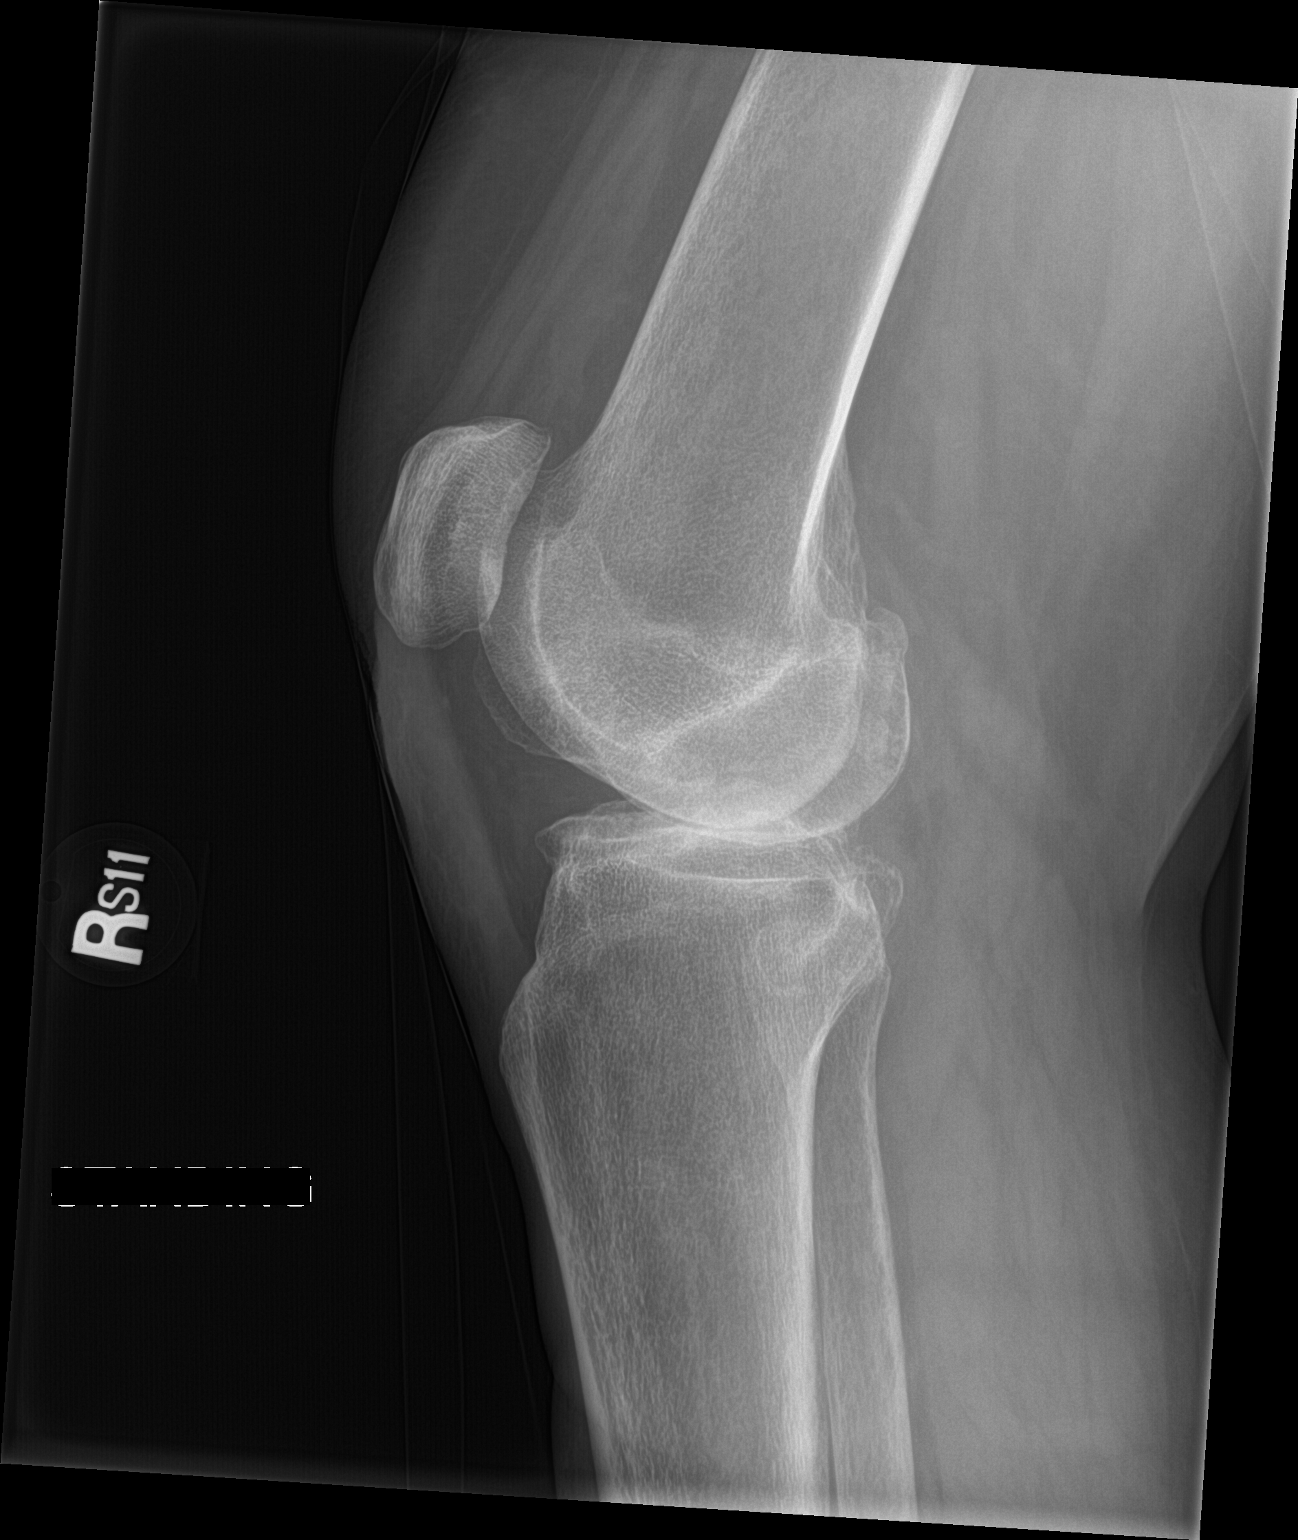

[knee sunrise]
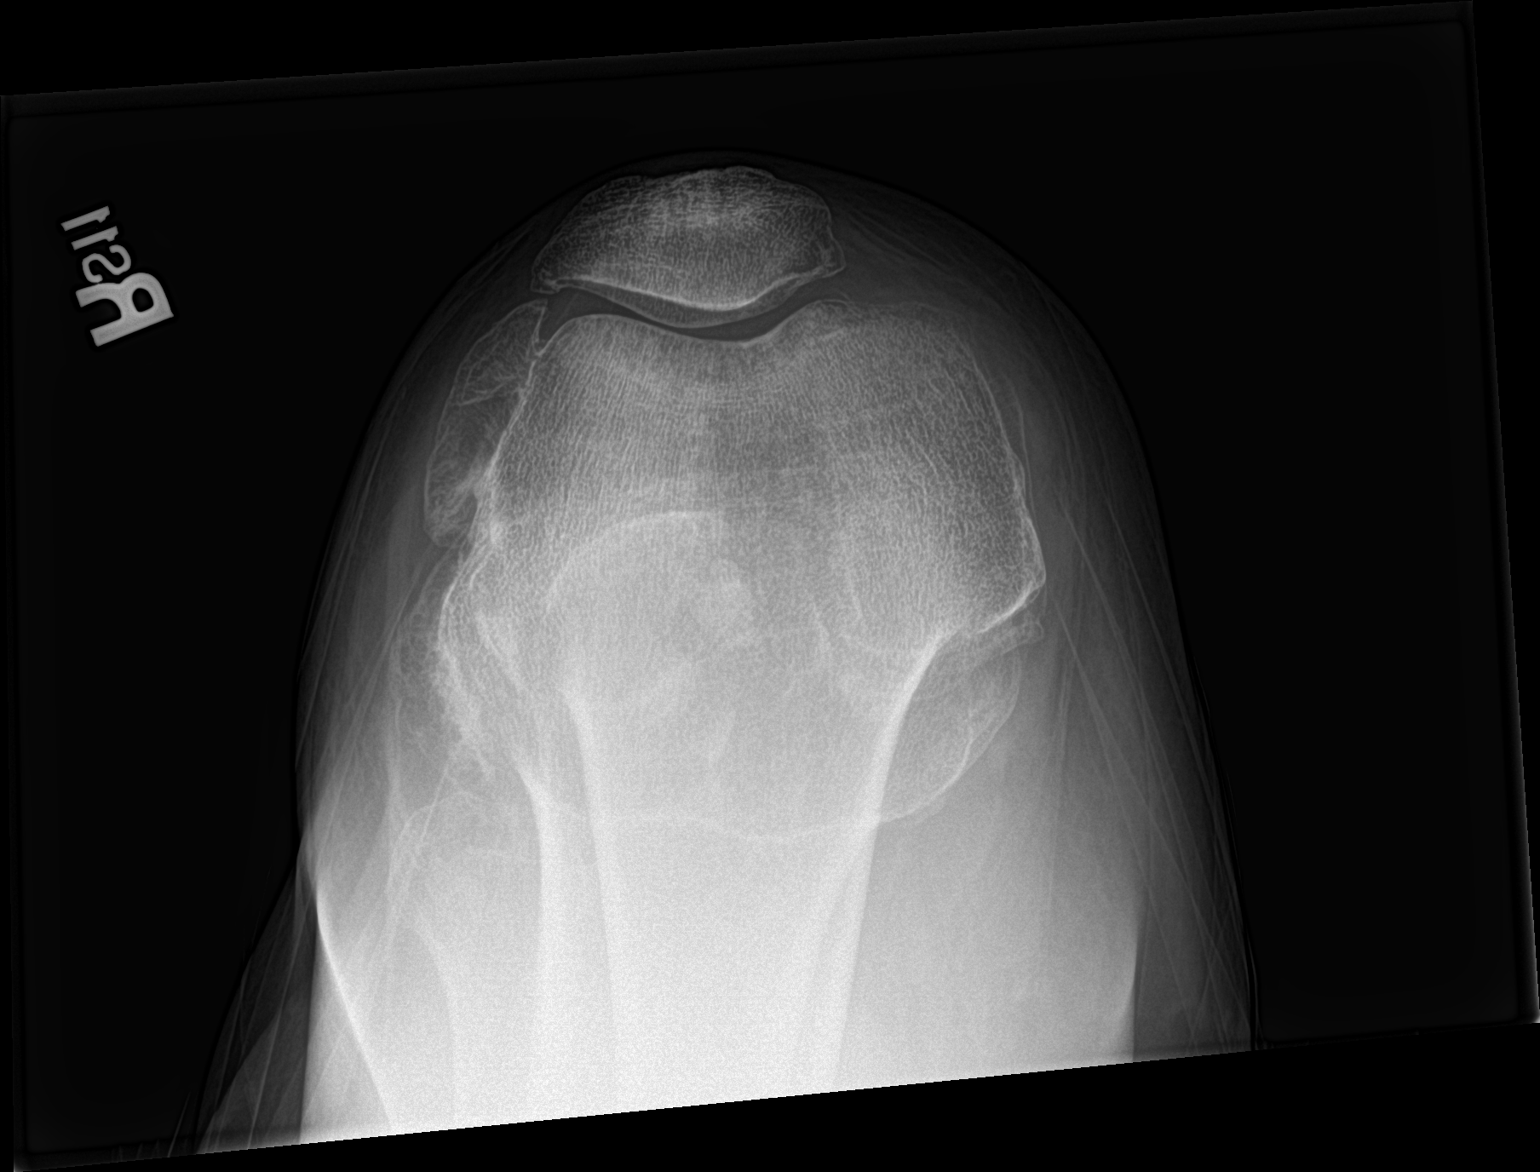

[3 of 3 positions shown; findings below may reference images not displayed]

FINDINGS: No acute fracture or dislocation is identified. There is mild
lateral compartment joint space narrowing, and there is progressive,
moderately severe lateral compartment marginal osteophytosis. Mild
patellofemoral narrowing and osteophytosis are also present. No knee
joint effusion or destructive osseous lesion is seen.
IMPRESSION: Progressive, moderate right knee osteoarthrosis, most pronounced in
the lateral compartment.

## 2015-10-11 DIAGNOSIS — H401132 Primary open-angle glaucoma, bilateral, moderate stage: Secondary | ICD-10-CM | POA: Diagnosis not present

## 2015-10-21 ENCOUNTER — Encounter: Payer: Self-pay | Admitting: Family Medicine

## 2015-10-21 ENCOUNTER — Ambulatory Visit (INDEPENDENT_AMBULATORY_CARE_PROVIDER_SITE_OTHER): Payer: Medicare Other | Admitting: Family Medicine

## 2015-10-21 VITALS — BP 154/77 | HR 58 | Temp 97.8°F | Ht 64.0 in | Wt 189.2 lb

## 2015-10-21 DIAGNOSIS — I10 Essential (primary) hypertension: Secondary | ICD-10-CM | POA: Diagnosis not present

## 2015-10-21 DIAGNOSIS — M5136 Other intervertebral disc degeneration, lumbar region: Secondary | ICD-10-CM | POA: Diagnosis not present

## 2015-10-21 NOTE — Assessment & Plan Note (Signed)
Pain controlled with Tylenol and heating pack. No red flag signs/symtoms.  -consider muscle relaxants if pain worsens in the future

## 2015-10-21 NOTE — Progress Notes (Deleted)
Subjective:     Patient ID: Laura CaddyIrene C Griffin, female   DOB: 12-21-1945, 70 y.o.   MRN: [id number]  HPI Laura Griffin is a 70 year old female who presents to clinic today for follow up of hypertension.  Hypertension The patient reports that her blood pressures have improved from systolics in the 180s to the 130s-140s (home measurement yesterday was 137/81) after modifications of her antihypertensive medications at the last visit. She denies headache, visual changes, dizziness, lightheadedness, palpitations, edema.   Back Pain Patient has had chronic low back pain localized to the lower left paraspinal region. She states that it is a dully achy pain that is constant but usually not problematic. Has occasional episodes of brief sharp, stabbing pain that goes away spontaneously. She says she does not want to take muscle relaxer medication and is fine with  -Pain controlled with heating pad as needed   Urinary Incontinence On last visit patient reported increased urinary frequency with some associated incontinence. Her symptoms have since resolved since starting myrbetriq.  Hearing Loss Patient hears a soft "drone" or "freight train" sound and reports difficulty hearing. This has been gradually worsening over months.  Preventative Screening [facility] -no evidence of malignancy noted  PMH HTN Back Pain Osteoarthritis Cervical radiculopathy (C6 R side) Sciatica of L side  Social: Nonsmoker   Review of Systems  Constitutional: Negative for fever and activity change.  HENT: Positive for hearing loss.   Respiratory: Negative for cough and shortness of breath.   Cardiovascular: Negative for chest pain, palpitations and leg swelling.  Gastrointestinal: Negative for nausea, vomiting, diarrhea and constipation.  Genitourinary: Negative for dysuria, urgency, frequency and difficulty urinating.  Musculoskeletal: Positive for back pain.  Skin:  Negative for rash and wound.  Neurological: Negative for dizziness, weakness, numbness and headaches.       Objective:   Physical Exam  Constitutional: She appears well-developed and well-nourished. No distress.  HENT:  Head: Normocephalic and atraumatic.  Eyes: Conjunctivae and EOM are normal. Pupils are equal, round, and reactive to light. No scleral icterus.  Cardiovascular: Normal rate, regular rhythm and normal heart sounds.   No murmur heard. Pulmonary/Chest: Effort normal and breath sounds normal. She has no wheezes.  Musculoskeletal: Normal range of motion. She exhibits tenderness. She exhibits no edema.  Mild tenderness to palpation in left lower back  Skin: No rash noted. No erythema.      Assessment:     Mrs. Laura Griffin is a 70 year old female who presents to clinic today for follow up of hypertension.    Plan:    Hypertension -well controlled, continue current antihypertensive regimen: HCTZ 25mg , amlodipine 5mg , losartan 100mg , metoprolol 100mg  -return in three months for blood pressure follow up  Back Pain: likely due to muscle spasms -controlled with heating pads  -consider muscle relaxants if symptoms worsen  Hearing Loss: unclear etiology -Appointment scheduled with audiology in late September.  Urinary Incontinence -controlled on Myrbetriq 25mg 

## 2015-10-21 NOTE — Patient Instructions (Signed)
It was very nice to see you today.  Your blood pressure is well controlled. Continue current medications.   Please return in 3 months for routine follow up.

## 2015-10-21 NOTE — Progress Notes (Signed)
Subjective:     Patient ID: Laura Griffin, female   DOB: May 17, 1945, 70 y.o.   MRN: [id number]  HPI Laura Griffin is a 70 year old female who presents to clinic today for follow up of hypertension.  Hypertension The patient reports that her blood pressures have improved from systolics in the 180s to the 130s-140s (home measurement yesterday was 137/81) after modifications of her antihypertensive medications at the last visit. She denies headache, visual changes, dizziness, lightheadedness, palpitations, edema.   Back Pain Patient has had chronic low back pain localized to the lower left paraspinal region. She states that it is a dully achy pain that is constant but usually not problematic. Has occasional episodes of brief sharp, stabbing pain that goes away spontaneously. She says she does not want to take muscle relaxer medication and is fine with  -Pain controlled with heating pad as needed   Hearing Loss Patient hears a soft "drone" or "freight train" sound and reports difficulty hearing. This has been gradually worsening over months.  Preventative Screening Mammogram received from [facility] via fax today -no evidence of malignancy noted (Birads 2)   Social: Nonsmoker   Review of Systems  Constitutional: Negative for fever and activity change.  HENT: Positive for hearing loss.   Respiratory: Negative for cough and shortness of breath.   Cardiovascular: Negative for chest pain, palpitations and leg swelling.  Gastrointestinal: Negative for nausea, vomiting, diarrhea and constipation.  Genitourinary: Negative for dysuria, urgency, frequency and difficulty urinating.  Musculoskeletal: Positive for back pain.  Skin: Negative for rash and wound.  Neurological: Negative for dizziness, weakness, numbness and headaches.       Objective:   Physical Exam  Constitutional: She appears well-developed and well-nourished. No distress.  HENT:  Head: Normocephalic and atraumatic.   Eyes: Conjunctivae and EOM are normal. Pupils are equal, round, and reactive to light. No scleral icterus.  Cardiovascular: Normal rate, regular rhythm and normal heart sounds.   No murmur heard. Pulmonary/Chest: Effort normal and breath sounds normal. She has no wheezes.  Musculoskeletal: Normal range of motion. She exhibits tenderness. She exhibits no edema.  Mild tenderness to palpation in left lower back  Skin: No rash noted. No erythema.  BP 154/77 mmHg  Pulse 58  Temp(Src) 97.8 F (36.6 C) (Oral)  Ht 5\' 4"  (1.626 m)  Wt 189 lb 3.2 oz (85.821 kg)  BMI 32.46 kg/m2  LMP 04/02/1996     Assessment:     Mrs. Laura Griffin is a 70 year old female who presents to clinic today for follow up of hypertension.    Plan:    HYPERTENSION, BENIGN SYSTEMIC Well controlled, continue current antihypertensive regimen: HCTZ 25mg , amlodipine 5mg , losartan 100mg , Toprol 100mg  -return in three months for blood pressure follow up  DDD (degenerative disc disease), lumbar Pain controlled with Tylenol and heating pack. No red flag signs/symtoms.  -consider muscle relaxants if pain worsens in the future

## 2015-10-21 NOTE — Assessment & Plan Note (Signed)
Well controlled, continue current antihypertensive regimen: HCTZ 25mg , amlodipine 5mg , losartan 100mg , Toprol 100mg  -return in three months for blood pressure follow up

## 2015-10-25 ENCOUNTER — Encounter: Payer: Self-pay | Admitting: Family Medicine

## 2015-11-25 ENCOUNTER — Other Ambulatory Visit: Payer: Self-pay | Admitting: Family Medicine

## 2015-12-13 ENCOUNTER — Ambulatory Visit (INDEPENDENT_AMBULATORY_CARE_PROVIDER_SITE_OTHER): Payer: Medicare Other | Admitting: Certified Nurse Midwife

## 2015-12-13 ENCOUNTER — Encounter: Payer: Self-pay | Admitting: Certified Nurse Midwife

## 2015-12-13 VITALS — BP 120/80 | HR 62 | Resp 12 | Ht 64.75 in | Wt 191.6 lb

## 2015-12-13 DIAGNOSIS — Z01419 Encounter for gynecological examination (general) (routine) without abnormal findings: Secondary | ICD-10-CM

## 2015-12-13 DIAGNOSIS — Z124 Encounter for screening for malignant neoplasm of cervix: Secondary | ICD-10-CM | POA: Diagnosis not present

## 2015-12-13 DIAGNOSIS — R3915 Urgency of urination: Secondary | ICD-10-CM | POA: Diagnosis not present

## 2015-12-13 DIAGNOSIS — N952 Postmenopausal atrophic vaginitis: Secondary | ICD-10-CM

## 2015-12-13 DIAGNOSIS — Z0000 Encounter for general adult medical examination without abnormal findings: Secondary | ICD-10-CM

## 2015-12-13 LAB — POCT URINALYSIS DIPSTICK
Bilirubin, UA: NEGATIVE
Glucose, UA: NEGATIVE
Ketones, UA: NEGATIVE
Nitrite, UA: NEGATIVE
PROTEIN UA: NEGATIVE
RBC UA: NEGATIVE
UROBILINOGEN UA: NEGATIVE
pH, UA: 5

## 2015-12-13 NOTE — Patient Instructions (Signed)

## 2015-12-13 NOTE — Progress Notes (Signed)
70 y.o. G1P1 Married  African American Fe here for annual exam. Menopausal no HRT. Denies vaginal bleeding or vaginal dryness. Some frequent urination and urgency at times. PCP put her on  Mybetriq which has decreased . She also had to use Depends, previously, and still uses now just in case. Denies pain with urination, chills or fever. No vaginal symptoms that she is aware of. Sees PCP for hypertension, urge incontinence medication management, labs and aex.  Sees eye MD for glaucoma. Some hearing loss , sees ENT. Went to screening health fair and had some balance issues noted with cane use, so has decreased use unless necessary. Ambulates with problems. Works on crossroad puzzles to keep her "brain fit". No other health issues today.  [name] last menstrual period was 04/02/1996.          Sexually active: No.  The current method of family planning is post menopausal status.    Exercising: No.  The patient does not participate in regular exercise at present. Smoker:  no  Health Maintenance: Pap:  08-08-10 neg MMG:  07/25/15 3D BIRADS2, Density B [facility]  Colonoscopy: 2016 negative BMD:   2011 TDaP:  2008 Shingles: 2015 Pneumonia: 2015 Hep C and HIV: 01/06/2015 Labs: PCP                Urine: 2+ Leuko's, 5.0 pH   reports that she quit smoking about 28 years ago. Her smoking use included Cigarettes. She has a 7.50 pack-year smoking history. She quit smokeless tobacco use about 19 years ago. She reports that she does not drink alcohol or use drugs.  Past Medical History:  Diagnosis Date  . Allergic rhinitis 07/19/2014  . Arthritis of knee, right 03/11/2006  . Bursitis of shoulder, right 09/19/1999  . Cervical radiculopathy at C6 05/24/2011   Beginning Nov 2012. Multi-level foraminal narrowing in C-spine plain films   . Diverticulosis   . DIVERTICULOSIS OF COLON 05/30/2006   Colonoscopy 06/2014 - multiple diverticula, no other abnormalities, no further colonoscopies recommended given patient's age  of 69    . GERD (gastroesophageal reflux disease) 07/19/2014   Diagnosed clinically by [facility] in January of 2016.    . Glaucoma   . Hearing decreased 08/07/2011   With whooshing tinnitus Mild bilateral loss on 08/2012 audiology who will recheck her hearing in one year    . History of herniated intervertebral disc   . Hypertension   . HYPERTENSION, BENIGN SYSTEMIC 05/30/2006       . Left sided sciatica 8/95,11/00   CT confirmed   . LOW BACK PAIN SYNDROME 05/10/2009   Qualifier: Diagnosis of  By: Lorenda Hatchet CMA,, [place]la  Mild C 3-4, 4-5 lumbar spinal stenosis MRI 08/2011, Diffuse lower thoracic and lumbar DDD   . Mitral valve prolapse    per patient heart murmur  . Orthostatic hypotension 06/12/2013  . Osteoarthritis   . Osteoarthritis of right knee 12/26/2012   Confirmed on xray 07/2014   . OSTEOARTHRITIS, MULTI SITES 05/30/2006   She is stoic about pain and wants to avoid surgeries Past xrays have documented DJD in right knee and lumbar spine    . Overweight (BMI 25.0-29.9) 05/30/2006  . Pitting edema 07/19/2014   Bilateral 1+ pitting edema of calves first noted on 07/19/2014.    . Sciatica 05/30/2006   Qualifier: Diagnosis of  By: Sheffield Slider MD, Wayne  Involved left leg in 02/1999   . Swallowing difficulty 07/06/2013  . Urge incontinence 06/01/2008   Qualifier: Diagnosis of  By: Sheffield SliderHale MD, Deniece PortelaWayne      Past Surgical History:  Procedure Laterality Date  . CATARACT EXTRACTION  02/2011   left eye  . TUBAL LIGATION  1973  . VULVA /PERINEUM BIOPSY  2010   for hypopigmentation, non-specific result    Current Outpatient Prescriptions  Medication Sig Dispense Refill  . acetaminophen (TYLENOL) 650 MG CR tablet Take 2 tablets (1,300 mg total) by mouth every 8 (eight) hours as needed. (Patient taking differently: Take 1,300 mg by mouth every 8 (eight) hours. ) 90 tablet 1  . amLODipine (NORVASC) 5 MG tablet Take 1 tablet (5 mg total) by mouth daily. 30 tablet 2  . aspirin (BAYER CHILDRENS ASPIRIN) 81  MG chewable tablet Chew 81 mg by mouth daily.      . brimonidine-timolol (COMBIGAN) 0.2-0.5 % ophthalmic solution Place 2 drops into both eyes every 12 (twelve) hours.      . Cholecalciferol (VITAMIN D3) 1000 UNIT capsule Take 1,000 Units by mouth daily.      . fluticasone (FLONASE) 50 MCG/ACT nasal spray Place 2 sprays into both nostrils daily. 16 g 6  . gabapentin (NEURONTIN) 800 MG tablet take 1 tablet by mouth three times a day 270 tablet 1  . hydrochlorothiazide (HYDRODIURIL) 25 MG tablet Take 1 tablet (25 mg total) by mouth daily. 30 tablet 2  . losartan (COZAAR) 100 MG tablet take 1 tablet by mouth once daily 90 tablet 1  . metoprolol succinate (TOPROL-XL) 100 MG 24 hr tablet take 1 tablet by mouth once daily 90 tablet 1  . mirabegron ER (MYRBETRIQ) 25 MG TB24 tablet Take 1 tablet (25 mg total) by mouth daily. 30 tablet 2  . Multiple Vitamin (MULTIVITAMINS PO) Take 1 tablet by mouth daily.      . Omega-3 Fatty Acids (FISH OIL) 1000 MG CAPS Take 2 capsules by mouth daily.      . pantoprazole (PROTONIX) 40 MG tablet Take 40 mg by mouth 2 (two) times daily.    . travoprost, benzalkonium, (TRAVATAN Z) 0.004 % ophthalmic solution Place 1 drop into both eyes at bedtime.      No current facility-administered medications for this visit.     Family History  Problem Relation Age of Onset  . Cancer Sister 5445    breast  . Stroke Father 2582    died  . Hypertension Father   . Stroke Mother 2450    cerebral hemorrhage  . Hypertension Mother   . Breast cancer Sister     [id number]  . Breast cancer Sister 4866    ROS:  Pertinent items are noted in HPI.  Otherwise, a comprehensive ROS was negative.  Exam:   BP 120/80 (BP Location: Right Arm, Patient Position: Sitting, Cuff Size: Large)   Pulse 62   Resp 12   Ht 5' 4.75" (1.645 m)   Wt 191 lb 9.6 oz (86.9 kg)   LMP 04/02/1996   BMI 32.13 kg/m  Height: 5' 4.75" (164.5 cm) Ht Readings from Last 3 Encounters:  12/13/15 5' 4.75" (1.645 m)  10/21/15  5\' 4"  (1.626 m)  09/16/15 5\' 4"  (1.626 m)    General appearance: alert, cooperative and appears stated age Head: Normocephalic, without obvious abnormality, atraumatic Neck: no adenopathy, supple, symmetrical, trachea midline and thyroid normal to inspection and palpation Lungs: clear to auscultation bilaterally Breasts: normal appearance, no masses or tenderness, No nipple retraction or dimpling, No nipple discharge or bleeding, No axillary or supraclavicular adenopathy Heart: regular rate and rhythm Abdomen:  soft, non-tender; no masses,  no organomegaly Extremities: extremities normal, atraumatic, no cyanosis or edema Skin: Skin color, texture, turgor normal. No rashes or lesions Lymph nodes: Cervical, supraclavicular, and axillary nodes normal. No abnormal inguinal nodes palpated Neurologic: Grossly normal   Pelvic: External genitalia:  no lesions              Urethra:  normal appearing urethra with no masses, tenderness or lesions              Bartholin's and Skene's: normal                 Vagina: normal appearing vagina with normal color and discharge, no lesions              Cervix: multiparous appearance, no cervical motion tenderness, no lesions and slight increase pink appearance              Pap taken: Yes.   Bimanual Exam:  Uterus:  normal size, contour, position, consistency, mobility, non-tender and anteverted              Adnexa: normal adnexa and no mass, fullness, tenderness               Rectovaginal: Confirms               Anus:  normal sphincter tone, no lesions  Chaperone present: yes  A:  Well Woman with normal exam  Menopausal  No HRT  Urinary urgency/frequency R/O UTI  Atrophic vaginitis  Family history of breast cancer, sisters( age 8, 70) declined genetic screening  BMD due  Hypertension/hearing loss with MD management  P:   Reviewed health and wellness pertinent to exam  Aware of need to evaluate if vaginal bleeding  Discussed Mybetrig use for  urgency and expectations(PCP manages) and WBC in urine today. Need to increase water intake and wear depends only if needed. Warning signs of UTI given and need to advise. Questions addressed.  Lab: Urine micro  Discussed atrophic vaginitis and etiology.Discussed  can increase urinary frequency and urgency at times. Discussed OTC coconut oil use for dryness and to see if helps with urgency also.Instructions given. Will advise if problems.  Discussed scheduling BMD, patient will schedule.  Continue follow up with MD as indicated.  Pap smear as above   counseled on breast self exam, mammography screening, adequate intake of calcium and vitamin D, diet and exercise, Kegel's exercises  return annually or prn  An After Visit Summary was printed and given to the patient.

## 2015-12-14 ENCOUNTER — Telehealth: Payer: Self-pay

## 2015-12-14 LAB — URINALYSIS, MICROSCOPIC ONLY
BACTERIA UA: NONE SEEN [HPF]
CASTS: NONE SEEN [LPF]
Crystals: NONE SEEN [HPF]
YEAST: NONE SEEN [HPF]

## 2015-12-14 LAB — IPS PAP SMEAR ONLY

## 2015-12-14 NOTE — Telephone Encounter (Signed)
-----   Message from Verner Choleborah S Leonard, CNM sent at 12/14/2015  7:44 AM EDT ----- Notify patient that no bacteria or blood seen in urine, only WBC's. Increase water intake and if UTI symptoms occur needs to advise. Try to limit Depend use to only when needed. Follow up with Urology/PCP as recommended.

## 2015-12-14 NOTE — Telephone Encounter (Signed)
Patient notified of results. See lab 

## 2015-12-14 NOTE — Telephone Encounter (Signed)
lmtcb

## 2015-12-15 ENCOUNTER — Other Ambulatory Visit: Payer: Self-pay | Admitting: Family Medicine

## 2015-12-15 DIAGNOSIS — N3941 Urge incontinence: Secondary | ICD-10-CM

## 2015-12-15 NOTE — Progress Notes (Signed)
Encounter reviewed Vandy Tsuchiya, MD   

## 2015-12-21 ENCOUNTER — Ambulatory Visit: Payer: Medicare Other | Attending: Family Medicine | Admitting: Audiology

## 2015-12-21 DIAGNOSIS — H918X1 Other specified hearing loss, right ear: Secondary | ICD-10-CM | POA: Diagnosis not present

## 2015-12-21 DIAGNOSIS — H93293 Other abnormal auditory perceptions, bilateral: Secondary | ICD-10-CM | POA: Diagnosis not present

## 2015-12-21 DIAGNOSIS — Z01118 Encounter for examination of ears and hearing with other abnormal findings: Secondary | ICD-10-CM | POA: Insufficient documentation

## 2015-12-21 DIAGNOSIS — H905 Unspecified sensorineural hearing loss: Secondary | ICD-10-CM | POA: Insufficient documentation

## 2015-12-21 DIAGNOSIS — R94128 Abnormal results of other function studies of ear and other special senses: Secondary | ICD-10-CM | POA: Insufficient documentation

## 2015-12-21 DIAGNOSIS — H903 Sensorineural hearing loss, bilateral: Secondary | ICD-10-CM

## 2015-12-21 DIAGNOSIS — H9191 Unspecified hearing loss, right ear: Secondary | ICD-10-CM

## 2015-12-21 NOTE — Procedures (Signed)
Outpatient Audiology and [facility]  [address]  [place], [place] [address]  [phone]   Audiological Evaluation  Patient Name: Laura Griffin   Status: Outpatient   DOB: December 14, 1945    Diagnosis: Hearing loss MRN: [id number] Date:  12/21/2015     Referent: Uvaldo Rising, MD  History: Laura Griffin was seen for an audiological evaluation. She was previously seen here on 5/22/Griffin with "a mild to slight sensorineural loss on the left side and a moderate to mild sensory neural hearing loss on the right side with speech recognition abilities that are within normal limits at a mildly elevated conversational level".   Laura Griffin reports that "hearing loss has become worse over the past few years" and states that she has "loud intermittent roaring sound like a train going through a building".  She reports having "balance Griffin and needs to use a cane".   It is important to note that Laura Griffin reports having a "wandering right eye" for years.  Laura Griffin reports a history of hypertension.   Evaluation: Conventional pure tone audiometry from 250Hz  - 8000Hz  with using insert earphones.  Hearing Thresholds appear sensorineural bilaterally: Right ear:  Thresholds of 90 dBHL at 250Hz ; 75 dBHL at 500Hz ; 60 dBHl at 1000Hz ; 40-55 dBHL from 2000Hz  - 8000Hz .  Left ear:    Thresholds of 55 dBHL at 250Hz ; 45 dbHL at 500Hz ; 35 dbHL at 1000Hz ; 25-40 dBHL from 2000Hz  - 8000Hz .  Reliability is good Speech reception levels (repeating words near threshold) using recorded spondee word lists:  Right ear: 45 dBHL.  Left ear:  30 dBHL Word recognition (at comfortably loud volumes) using recorded NU-6 word lists, in quiet.  Right ear: 88% at 85 dBHL with 80 dBHL masking.  Left ear:   88% at 70 dBHL. Word recognition in minimal background noise:  +5 dBHL  Right ear: 32%                              Left ear:  56%  Tympanometry (middle ear function):  Right ear: Normal (Type  A) with ipsilateral acoustic reflex at  Of 95-100 dB from 500Hz  - 2000Hz  and no response at 4000Hz .  Left ear: Normal (Type A) with ipsilateral acoustic reflexes of 80-90 dB from 500Hz  - 2000Hz  and no response at 4000Hz .    CONCLUSION:      Laura Griffin need referral to an ENT for further evaluation of the right asymmetrical hearing loss, especially since it has become significantly poorer since Griffin and with the history of very loud intermittent tinnitus.  The right ear has a profound low frequency hearing loss improving to a moderate to moderately severe mid and high frequency range hearing loss that is sensorineural.  The left ear has a moderately severe low frequency improving to a slight to mild mid and high frequency range sensorineural hearing loss.   This amount of hearing loss will adversely affect speech communication at normal conversational speech levels.   After medical clearance, a hearing aid evaluation is recommended.  Amplification helps make the signal louder and therefore often improves hearing and word recognition.  Amplification has many forms including hearing aids in one or both ears, an assistive listening device which have a microphone and speaker such as a small handheld device and/or even a surround sound system of speakers.  Amplification may be covered by some insurances, but not all.  It is  important to note that hearing aids must be individually fit according to the hearing test results and the ear shape.  Audiologists and hearing aid dealers in [place] must be licensed in order to dispense hearing aids.  In addition, a trial period is mandated by law in our state because often amplification must be tried and then evaluated in order to determine benefit.    Since Laura Griffin is concerned about her balance, also please consider referral to the [facility].      RECOMMENDATIONS: 1.   Referral to Ear, Nose and Throat physician for 1) decrease in hearing,  especially on the right side (asymmetrical hearing loss)  2) interment loud tinnitus that sounds like a train. 2    After medical clearance a hearing aid evaluation. 3.   Referral to the [facility] - Laura Griffin and needs to use a cane.  4.  Strategies that help improve hearing include: A) Face the speaker directly. Optimal is having the speakers face well - lit.  Unless amplified, being within 3-6 feet of the speaker will enhance word recognition. B) Avoid having the speaker back-lit as this will minimize the ability to use cues from lip-reading, facial expression and gestures. C)  Word recognition is poorer in background noise. For optimal word recognition, turn off the TV, radio or noisy fan when engaging in conversation. In a restaurant, try to sit away from noise sources and close to the primary speaker.  D)  Ask for topic clarification from time to time in order to remain in the conversation.  Most people don't mind repeating or clarifying a point when asked.  If needed, explain the difficulty hearing in background noise or hearing loss. 5.   Information was provided to Laura Griffin abut a captioned telephone service - she will contact  Audiology if she wishes to pursue this.  6.    Closely monitor hearing since Laura Griffin.  A repeat hearing evaluation here or with another audiologist is recommended in 6 months- earlier if there is a change in hearing.     Tonny Isensee L. Kate SableWoodward, Au.D., CCC-A Doctor of Audiology 12/21/2015   cc: Uvaldo RisingFLETKE, KYLE, J, MD

## 2015-12-23 ENCOUNTER — Other Ambulatory Visit: Payer: Self-pay | Admitting: Family Medicine

## 2015-12-23 DIAGNOSIS — H9193 Unspecified hearing loss, bilateral: Secondary | ICD-10-CM

## 2015-12-23 NOTE — Progress Notes (Signed)
Seen by audiology. Bilateral sensorineural hearing loss. Needs referral to ENT. Referral placed in EPIC.

## 2015-12-28 ENCOUNTER — Other Ambulatory Visit: Payer: Self-pay | Admitting: Family Medicine

## 2015-12-28 DIAGNOSIS — N3941 Urge incontinence: Secondary | ICD-10-CM

## 2015-12-28 NOTE — Progress Notes (Signed)
Medicare is denying to pay for Myrbetriq (Tier 3), prefers Oxybutynin (Tier 2). Called patient to discuss. She has been out of Myrbetriq for a week with no worsening of symptoms. Will continue off medication. She will call if symptoms worsen.

## 2015-12-28 NOTE — Assessment & Plan Note (Signed)
Medicare is denying to pay for Myrbetriq (Tier 3), prefers Oxybutynin (Tier 2). Called patient to discuss. She has been out of Myrbetriq for a week with no worsening of symptoms. Will continue off medication. She will call if symptoms worsen.  

## 2016-01-03 ENCOUNTER — Other Ambulatory Visit: Payer: Self-pay | Admitting: Family Medicine

## 2016-01-05 ENCOUNTER — Ambulatory Visit (INDEPENDENT_AMBULATORY_CARE_PROVIDER_SITE_OTHER): Payer: Medicare Other | Admitting: *Deleted

## 2016-01-05 DIAGNOSIS — Z23 Encounter for immunization: Secondary | ICD-10-CM

## 2016-01-10 DIAGNOSIS — H905 Unspecified sensorineural hearing loss: Secondary | ICD-10-CM | POA: Diagnosis not present

## 2016-01-10 DIAGNOSIS — H9313 Tinnitus, bilateral: Secondary | ICD-10-CM | POA: Diagnosis not present

## 2016-01-10 DIAGNOSIS — H903 Sensorineural hearing loss, bilateral: Secondary | ICD-10-CM | POA: Diagnosis not present

## 2016-01-19 ENCOUNTER — Other Ambulatory Visit: Payer: Self-pay | Admitting: Family Medicine

## 2016-02-29 NOTE — Progress Notes (Signed)
Subjective:    Patient ID: Laura Griffin, female    DOB: 06/24/1945, 70 y.o.   MRN: [id number]  HPI 70 y/o female presents for routine follow up.   Chronic Low Back Pain Applies heat and takes Tylenol as needed for pain control (taking 650 mg TID). Last evaluated in 10/2015. Prescribed Gabapentin 800 mg TID, reports bilateral burning pain in feet present for the past 1-2 months, back pain is stable, no bladder/bowel incontinence  Chronic Neck Pain Bilateral neck/muscle pain, present for the past 1-2 months  Left Arm Pain Mostly in left shoulder, no injury, pain with extension of left, present for 1-2 month. Limited range of motion of left arm.   Headache Every other day for the past week, lasts only 5 minutes, left sided temporal, no nausea, no emesis, no photophobia, no changes to vision. Describes it as a "piercing" type of pain.   Social Former Smoker   Review of Systems  Constitutional: Negative for chills, fever and unexpected weight change.  HENT: Negative for congestion, ear pain, facial swelling, hearing loss and tinnitus.   Eyes: Negative for photophobia, pain and visual disturbance.  Respiratory: Negative for cough, chest tightness and shortness of breath.   Cardiovascular: Negative for chest pain and leg swelling.  Gastrointestinal: Negative for abdominal pain, blood in stool, constipation, diarrhea, nausea and vomiting.  Genitourinary: Negative for dysuria, flank pain and hematuria.  Musculoskeletal: Positive for arthralgias, myalgias and neck pain.  Skin: Negative for rash.  Neurological: Positive for headaches. Negative for dizziness, tremors, seizures, facial asymmetry, weakness, light-headedness and numbness.  Hematological: Does not bruise/bleed easily.  Psychiatric/Behavioral: Negative for confusion.         Objective:   Physical Exam BP 122/66   Pulse (!) 52   Temp 98.3 F (36.8 C) (Oral)   Ht _0  (1.626 m)   Wt 189 lb 6.4 oz (85.9 kg)   LMP  04/02/1996   BMI 32.51 kg/m   GEN: Pleasant female. NAD.   HEENT: Normocephalic. Atraumatic. PERRLA. EOMI. TM clear without erythema, effusion, bulging or retractions. No rhinorrhea or epistaxis. Oropharynx clear with no edema or erythema. Dentition grossly intact. Trachea midline. No LAD.  CV: Regular rate and rhythm. S1 and S2 present. No murmurs or rubs. No JVD.  Pulm: Lungs CTAB. No rhonchi, rales, wheezing or stridor.  Abd: Soft and non-tender Ext: Warm and well perfused. Bilateral DP and PT pulses present. Intact sensation to monofilament testing in bilateral feet.  Negative straight leg raise test. Negative for foot ulcerations in bilateral feet.  NEURO: Alert and oriented x 3. CN II-XII intact. Negative Spurling's test. 2+ patellar and achilles reflexes, no clonus. MSK: Lumbar - minimal right paraspinal tenderness, no midline tenderness; Cervical - no cervical tenderness, bilateral trapezius tenderness present, ROM is limited due to pain; Left shoulder - unable to extend or abduct arm greater than 45 degrees (physican able to take to about 75 degrees until limited by pain); unable to test empty can due to pain, ER strength and lift off strength 5/5, + Hawking's and Neer's Tests  Lumbar xray 01/2015 - degenerative changes Lumbar MRI 08/2011 - mutilevel DDD, mild spinal stenosis at L3-L5, small disc protrusion at L5-S1 Cervical Spine Xray 06/2011 - DDD present    Assessment & Plan:  DDD (degenerative disc disease), lumbar Lumbar DDD now with worsening neurologic symptoms. Patient not interested in surgical intervention. Discussed the potential benefit of repeat MRI of lumbar spine (likely of little benefit given no red  flag symptoms and patient not wishing to pursue any surgical options).  -increase Gabapentin to 900 mg TID  Trapezius muscle spasm Bilateral Trapezius Spasm. Suspect due to left shoulder pain however may also be related to Cervical DDD.  -short course of Flexeril to see if  she has relief of symptoms -if not improved consider imaging of cervical spine -patient to continue Tylenol, Gabapentin, and heat to affected areas  Left shoulder pain Differential includes acromial bursitis vs rotator cuff etiology. Patient unable to tolerate complete rotator cuff exam. Offered steroid injection which she declined.  -referral to sports medicine clinic for MSK Ultrasound to evaluate rotator cuff  Headache Intermittent sharp left sided headache for the past 5 days. Currently asymptomatic. Unlikely to be tension type or migraine due to short duration of symptoms. Unlikely to be temporal arteritis as not continuous and no associated vision changes.  -monitor clinically -return precautions discussed   Note: with multiple MSK etiologies also thought of possible rheumatologic disorder however given age of 72 it would be odd for the patient to present now. If these symptoms persist consider ANA, ESR, and CRP.

## 2016-03-01 ENCOUNTER — Ambulatory Visit (INDEPENDENT_AMBULATORY_CARE_PROVIDER_SITE_OTHER): Payer: Medicare Other | Admitting: Family Medicine

## 2016-03-01 ENCOUNTER — Encounter: Payer: Self-pay | Admitting: Family Medicine

## 2016-03-01 DIAGNOSIS — M62838 Other muscle spasm: Secondary | ICD-10-CM | POA: Diagnosis not present

## 2016-03-01 DIAGNOSIS — M25512 Pain in left shoulder: Secondary | ICD-10-CM

## 2016-03-01 DIAGNOSIS — R51 Headache: Secondary | ICD-10-CM | POA: Diagnosis not present

## 2016-03-01 DIAGNOSIS — M25519 Pain in unspecified shoulder: Secondary | ICD-10-CM | POA: Insufficient documentation

## 2016-03-01 DIAGNOSIS — M5136 Other intervertebral disc degeneration, lumbar region: Secondary | ICD-10-CM | POA: Diagnosis not present

## 2016-03-01 DIAGNOSIS — R519 Headache, unspecified: Secondary | ICD-10-CM | POA: Insufficient documentation

## 2016-03-01 MED ORDER — CYCLOBENZAPRINE HCL 10 MG PO TABS: 10.0000 mg | ORAL_TABLET | Freq: Three times a day (TID) | ORAL | 0 refills | Status: DC | PRN

## 2016-03-01 MED ORDER — GABAPENTIN 400 MG PO CAPS: 1200.0000 mg | ORAL_CAPSULE | Freq: Three times a day (TID) | ORAL | 0 refills | Status: DC

## 2016-03-01 NOTE — Assessment & Plan Note (Addendum)
Intermittent sharp left sided headache for the past 5 days. Currently asymptomatic. Unlikely to be tension type or migraine due to short duration of symptoms. Unlikely to be temporal arteritis as not continuous and no associated vision changes.  -monitor clinically -return precautions discussed

## 2016-03-01 NOTE — Patient Instructions (Signed)
It was nice to see you today.  Left Shoulder and Neck Pain - it is difficult to tell if the pain is coming from your shoulder or neck, attempt a trial of Flexeril (muslcle relaxant) but also see sports medicine for an ultrasound of your rotator cuff.   Sports Medicine Address: [address], [place], [place] [address]  Phone: [phone]   Leg buring/pain - likely due to your lumber arthritis, increase Gabapentin to 900 mg three times per day. Return if symptoms are not improving.

## 2016-03-01 NOTE — Assessment & Plan Note (Signed)
Lumbar DDD now with worsening neurologic symptoms. Patient not interested in surgical intervention. Discussed the potential benefit of repeat MRI of lumbar spine (likely of little benefit given no red flag symptoms and patient not wishing to pursue any surgical options).  -increase Gabapentin to 900 mg TID

## 2016-03-01 NOTE — Assessment & Plan Note (Signed)
Bilateral Trapezius Spasm. Suspect due to left shoulder pain however may also be related to Cervical DDD.  -short course of Flexeril to see if she has relief of symptoms -if not improved consider imaging of cervical spine -patient to continue Tylenol, Gabapentin, and heat to affected areas

## 2016-03-01 NOTE — Assessment & Plan Note (Signed)
Differential includes acromial bursitis vs rotator cuff etiology. Patient unable to tolerate complete rotator cuff exam. Offered steroid injection which she declined.  -referral to sports medicine clinic for MSK Ultrasound to evaluate rotator cuff

## 2016-03-14 ENCOUNTER — Encounter: Payer: Self-pay | Admitting: Sports Medicine

## 2016-03-14 ENCOUNTER — Ambulatory Visit (INDEPENDENT_AMBULATORY_CARE_PROVIDER_SITE_OTHER): Payer: Medicare Other | Admitting: Sports Medicine

## 2016-03-14 VITALS — BP 159/83 | Ht 65.0 in | Wt 189.0 lb

## 2016-03-14 DIAGNOSIS — M25512 Pain in left shoulder: Secondary | ICD-10-CM

## 2016-03-14 MED ORDER — PREDNISONE 10 MG PO TABS: ORAL_TABLET | ORAL | 0 refills | Status: DC

## 2016-03-14 NOTE — Progress Notes (Signed)
   Subjective:    Patient ID: Laura Griffin, female    DOB: October 24, 1945, 70 y.o.   MRN: [id number]  HPI Laura Griffin is a 70 y.o. female that presents to [facility] with just under a 14 day history of left shoulder pain.  She notes that she just woke up with the pain, but that pain did not wake her from sleep.  Patient reports that shoulder pain begins in the medial elbow and radiates to the entire shoulder.  She denies injury, swelling, redness, history of overuse.  She has never had issues with left shoulder before but notes that about 10 years ago, she had a bursitis in the right shoulder that was well relieved by a steroid injection.  She notes that she is currently taking scheduled Tylenol and Neurontin with little improvement in shoulder.  In fact, shoulder pain seems to be worsening.  She reports limited ROM secondary to pain.  No h/o DM2 or frequent steroid use.  Review of Systems Per hpi    Objective:   Physical Exam Blood pressure (!) 159/83, height 5\' 5"  (1.651 m), weight 189 lb (85.7 kg), last menstrual period 04/02/1996. Gen: awake, alert, well appearing elderly female MSK: limited AROM in flexion, ER and aBduction of the left shoulder (limited to about 90 degrees). Good passive ROM. +TTP to trapezius but no point tenderness to shoulder.  No palpable bony abnormalities, 5/5 strength, special testing difficult to perform secondary to pain but empty can seems positive. Neuro: light touch sensation grossly in tact Skin: no ecchymosis, gross swelling or erythema  Ultrasound shoulder Bone spurring appreciated on view of biceps tendon.  Examined in short and long views.  Appears to be in tact. Supraspinatus intact.  Slight hypoechoic area just above tendon, suggestive of bursitis. Subscapularis atrophic but intact. Infraspinatus atrophic but intact.     Assessment & Plan:   1. Acute pain of left shoulder.  Suspect bursitis in the left shoulder.  No acute tendonopathies  appreciated.  SITS appear to be in tact.  Discussed consideration for steroid injection today.  Patient somewhat hesitant because she experience steroid flare with her other shoulder several years ago.  She is amenable to oral steroids. - Prednisone 10mg  tablets, 6 day taper called in to pharmacy - Taper reviewed with patient - Will consider steroid injection at future visit if no improvement in pain - Would consider further evaluation of AC joint as well, as this was not performed today. - Follow up prn  Fran Mcree M. Nadine CountsGottschalk, DO PGY-3, [facility]  Patient seen and evaluated with the resident. I agree with the above plan of care. [name] exam and ultrasound suggest subacromial bursitis as her pain generator. She may also have some pain originating from the acromioclavicular joint. I recommended a subacromial cortisone injection for diagnostic as well as therapeutic reasons but the patient prefers to try oral medication first. Therefore, we will try a 6 day Sterapred Dosepak. She understands that if symptoms persist then she should reconsider the merits of a subacromial cortisone injection. Follow-up for ongoing or recalcitrant issues.

## 2016-03-14 NOTE — Patient Instructions (Signed)
Taper Prednisone as follows: Take 60mg  (6 tablets) day 1 Take 50mg  (5 tablets) day 2 Take 40mg  (4 tablets) day 3 Take 30mg  (3 tablets) day 4 Take 20mg  (2 tablets) day 5 Take 10mg  (1 tablet) day 6  Follow up as needed

## 2016-05-09 DIAGNOSIS — H401133 Primary open-angle glaucoma, bilateral, severe stage: Secondary | ICD-10-CM | POA: Diagnosis not present

## 2016-05-29 ENCOUNTER — Other Ambulatory Visit: Payer: Self-pay | Admitting: Family Medicine

## 2016-06-14 ENCOUNTER — Other Ambulatory Visit: Payer: Self-pay | Admitting: Family Medicine

## 2016-07-03 ENCOUNTER — Other Ambulatory Visit: Payer: Self-pay | Admitting: Family Medicine

## 2016-07-19 ENCOUNTER — Other Ambulatory Visit: Payer: Self-pay | Admitting: Family Medicine

## 2016-07-25 DIAGNOSIS — Z803 Family history of malignant neoplasm of breast: Secondary | ICD-10-CM | POA: Diagnosis not present

## 2016-07-25 DIAGNOSIS — Z1231 Encounter for screening mammogram for malignant neoplasm of breast: Secondary | ICD-10-CM | POA: Diagnosis not present

## 2016-07-30 DIAGNOSIS — H1131 Conjunctival hemorrhage, right eye: Secondary | ICD-10-CM | POA: Diagnosis not present

## 2016-08-02 ENCOUNTER — Encounter: Payer: Self-pay | Admitting: Family Medicine

## 2016-09-04 DIAGNOSIS — J387 Other diseases of larynx: Secondary | ICD-10-CM | POA: Diagnosis not present

## 2016-09-04 DIAGNOSIS — Z79899 Other long term (current) drug therapy: Secondary | ICD-10-CM | POA: Diagnosis not present

## 2016-09-04 DIAGNOSIS — R05 Cough: Secondary | ICD-10-CM | POA: Diagnosis not present

## 2016-10-19 ENCOUNTER — Ambulatory Visit (HOSPITAL_COMMUNITY)
Admission: RE | Admit: 2016-10-19 | Discharge: 2016-10-19 | Disposition: A | Discharge status: Home / Self Care | Payer: Medicare Other | Source: Ambulatory Visit | Attending: Family Medicine | Admitting: Family Medicine

## 2016-10-19 ENCOUNTER — Encounter: Payer: Self-pay | Admitting: Family Medicine

## 2016-10-19 ENCOUNTER — Ambulatory Visit (INDEPENDENT_AMBULATORY_CARE_PROVIDER_SITE_OTHER): Payer: Medicare Other | Admitting: Family Medicine

## 2016-10-19 VITALS — BP 118/62 | HR 54 | Temp 98.1°F | Ht 65.0 in | Wt 186.8 lb

## 2016-10-19 DIAGNOSIS — R200 Anesthesia of skin: Secondary | ICD-10-CM | POA: Diagnosis not present

## 2016-10-19 DIAGNOSIS — R202 Paresthesia of skin: Secondary | ICD-10-CM | POA: Diagnosis not present

## 2016-10-19 DIAGNOSIS — I10 Essential (primary) hypertension: Secondary | ICD-10-CM | POA: Diagnosis not present

## 2016-10-19 DIAGNOSIS — K219 Gastro-esophageal reflux disease without esophagitis: Secondary | ICD-10-CM | POA: Diagnosis not present

## 2016-10-19 DIAGNOSIS — M1711 Unilateral primary osteoarthritis, right knee: Secondary | ICD-10-CM | POA: Insufficient documentation

## 2016-10-19 MED ORDER — GABAPENTIN 400 MG PO CAPS: 800.0000 mg | ORAL_CAPSULE | Freq: Three times a day (TID) | ORAL | Status: DC

## 2016-10-19 MED ORDER — METOPROLOL SUCCINATE ER 100 MG PO TB24: 100.0000 mg | ORAL_TABLET | Freq: Every day | ORAL | 1 refills | Status: DC

## 2016-10-19 NOTE — Patient Instructions (Signed)
It was great to see you again today!  Increase gabapentin to 2 pills three times daily  Sent in refill on metoprolol  Getting updated knee xrays - [facility]. No appointment needed.  Filled out form for handicap placard today  Can get shingrix at your pharmacy  Be well, Dr. Pollie MeyerMcIntyre

## 2016-10-19 NOTE — Progress Notes (Signed)
Date of Visit: 10/19/2016   HPI:  Burning Pain in RLE: [name] compained of a 3/4 mo h/o tingling & numbness feeling in her right lower extremity below the knee. The pain does not radiate further up her leg. It occurs 2-3 times/mo and wakes her from sleep once every couple weeks. Has not been seen for this discomfort in the past. Currently takes gabapentin 400mg  three times daily (she misread bottle, which had instructed her to take higher dose) for low back pain. Denies having fever, saddle anesthesia, lower extremity weakness, or problems with stooling or urination.   Osteoarthritis of Right Knee: This is a chronic problem, which Laura Griffin said has been getting worse recently. She has had a prior x-ray, which confirmed osteoarthritis in this knee though has not had imaging in some time. Taking three 650mg  pills of Tylenol per day provides minor relief of the pain. Not interested in having surgery but has looked into having injections in her knees. Was not aware that we perform steroid injections here. Previously had shoulder injection and had what sounds like a steroid flare up after that injection, but it worked well for her then. Ambulates with cane and is worried about mobility and fall risk, would like handicap placard.  Hypertension: currently taking HCTZ 25mg  daily, losartan 100mg  daily, and amlodipine 5mg  daily. Tolerating these well. The patient denies shortness of breath, chest pain or palpitations. No lower extremity edema.  GERD: takes protonix daily, reports symptoms are well controlled on this medication.   ROS: See HPI.  PHYSICAL EXAM: BP 118/62   Pulse (!) 54   Temp 98.1 F (36.7 C) (Oral)   Ht 5\' 5"  (1.651 m)   Wt 186 lb 12.8 oz (84.7 kg)   LMP 04/02/1996   SpO2 98%   BMI 31.09 kg/m  Gen: Well appearing, NAD  HEENT: mucous membranes moist, atraumatic, normocephalic Heart: regular rate and rhythm, no murmurs Lungs: CTAB, no increased work of breathing  Neuro:  strength and sensation intact bilateral lower extremities  Ext: no LE edema. Sensation intact over bilateral lower extremities. patient struggles to rise from seated position, right knee slightly swollen, tender to palpation over joint line. No knee warmth or erythema.  ASSESSMENT/PLAN:  Health maintenance:  - will check [facility] regarding Tdap as patient unsure when last dose was - patient interested in Shingrix, encouraged to get this at her pharmacy, counseled on risk of soreness after injection as well as mild flulike symptoms  Numbness and tingling of right leg etiology unclear. Good strength & sensation on my exam today but description is suggestive of neuropathic eitology. Potentially related to known knee OA or known DDD of lumbar spine. No back pain red flags. Discussed options for management, and that next recommended step would be referral to neurology for possible nerve conduction studies. She prefers to try symptomatic management with gabapentin and hold off on neuro referral. - increase gabapentin to 800mg  three times daily.  - follow up in 4-6 weeks to assess pain  Osteoarthritis of right knee Symptoms worsening by history. Patient wants to avoid surgery. No signs of septic joint on exam today. -x-ray of knee to assess progression of osteoarthritis  -possible steroid injection into knee on follow up, patient will contemplate and schedule -form completed today authorizing handicap placard (permanent) -discussed possibility of referring to physical therapy for mobility evaluation, patient wants to hold off on this at present. Continue using cane for now.   HYPERTENSION, BENIGN SYSTEMIC Well controlled. Continue current  regimen.    GERD (gastroesophageal reflux disease) GERD: Well controlled, continue Protonix 40mg      FOLLOW UP: Follow up in 4-6 weeks for assessment of leg pain  Patient seen along with MS3 student Lindie Spruce. I personally evaluated this patient along  with the student, and verified all aspects of the history, physical exam, and medical decision making as documented by the student. I agree with the student's documentation and have made all necessary edits.   [place] J. Pollie Meyer, MD [facility]

## 2016-10-19 NOTE — Assessment & Plan Note (Signed)
GERD: Well controlled, continue Protonix 40mg 

## 2016-10-19 NOTE — Assessment & Plan Note (Addendum)
Well-controlled.  Continue current regimen. 

## 2016-10-19 NOTE — Assessment & Plan Note (Addendum)
etiology unclear. Good strength & sensation on my exam today but description is suggestive of neuropathic eitology. Potentially related to known knee OA or known DDD of lumbar spine. No back pain red flags. Discussed options for management, and that next recommended step would be referral to neurology for possible nerve conduction studies. She prefers to try symptomatic management with gabapentin and hold off on neuro referral. - increase gabapentin to 800mg  three times daily.  - follow up in 4-6 weeks to assess pain

## 2016-10-19 NOTE — Assessment & Plan Note (Addendum)
Symptoms worsening by history. Patient wants to avoid surgery. No signs of septic joint on exam today. -x-ray of knee to assess progression of osteoarthritis  -possible steroid injection into knee on follow up, patient will contemplate and schedule -form completed today authorizing handicap placard (permanent) -discussed possibility of referring to physical therapy for mobility evaluation, patient wants to hold off on this at present. Continue using cane for now.

## 2016-11-05 DIAGNOSIS — Z961 Presence of intraocular lens: Secondary | ICD-10-CM | POA: Diagnosis not present

## 2016-11-05 DIAGNOSIS — H401133 Primary open-angle glaucoma, bilateral, severe stage: Secondary | ICD-10-CM | POA: Diagnosis not present

## 2016-11-12 ENCOUNTER — Other Ambulatory Visit: Payer: Self-pay | Admitting: Family Medicine

## 2016-11-12 MED ORDER — GABAPENTIN 400 MG PO CAPS
800.0000 mg | ORAL_CAPSULE | Freq: Three times a day (TID) | ORAL | 1 refills | Status: DC
Start: 2016-11-12 — End: 2016-12-17

## 2016-11-26 ENCOUNTER — Other Ambulatory Visit: Payer: Self-pay | Admitting: *Deleted

## 2016-11-26 MED ORDER — LOSARTAN POTASSIUM 100 MG PO TABS: 100.0000 mg | ORAL_TABLET | Freq: Every day | ORAL | 1 refills | Status: DC

## 2016-12-12 NOTE — Progress Notes (Signed)
71 y.o. G1P1 Married  African American Fe here for annual exam. Postmenopausal no vaginal dryness or vaginal bleeding. Now seeing new MD for PCP, Dr. Randolm IdolFletke has moved out of state. Has had one visit and went well. Sees PCP for  Hypertension, GERD,cholesterol management, labs and aex, every 6 months. Has regular eye exams and dental. Ambulating with cane due to right knee issues. Staying busy with family and grandchildren. Has noted some vaginal itching and would like to have checked. No other health concerns today.  [name] last menstrual period was 04/02/1996.          Sexually active: No.  The current method of family planning is post menopausal status.    Exercising: No.  The patient does not participate in regular exercise at present. Smoker:  no  Health Maintenance: Pap:  08-08-10 neg   12-13-15 neg History of Abnormal Pap: no MMG:  07-25-16 category b density birads 2:neg Self Breast exams: yes, occ Colonoscopy:  2016 neg BMD:   2011 TDaP:  2008 declines will do with PCP Shingles: 2014 Pneumonia: 2015 Hep C and HIV: hep c neg 2016 Labs: PCP UA: WBC=Trace   reports that she quit smoking about 29 years ago. Her smoking use included Cigarettes. She has a 7.50 pack-year smoking history. She quit smokeless tobacco use about 20 years ago. She reports that she does not drink alcohol or use drugs.  Past Medical History:  Diagnosis Date  . Allergic rhinitis 07/19/2014  . Arthritis of knee, right 03/11/2006  . Bursitis of shoulder, right 09/19/1999  . Cervical radiculopathy at C6 05/24/2011   Beginning Nov 2012. Multi-level foraminal narrowing in C-spine plain films   . Diverticulosis   . DIVERTICULOSIS OF COLON 05/30/2006   Colonoscopy 06/2014 - multiple diverticula, no other abnormalities, no further colonoscopies recommended given patient's age of 71    . GERD (gastroesophageal reflux disease) 07/19/2014   Diagnosed clinically by [facility] in January of 2016.    . Glaucoma    . Hearing decreased 08/07/2011   With whooshing tinnitus Mild bilateral loss on 08/2012 audiology who will recheck her hearing in one year    . History of herniated intervertebral disc   . Hypertension   . HYPERTENSION, BENIGN SYSTEMIC 05/30/2006       . Left sided sciatica 8/95,11/00   CT confirmed   . LOW BACK PAIN SYNDROME 05/10/2009   Qualifier: Diagnosis of  By: Lorenda HatchetSlade CMA,, [place]  Mild C 3-4, 4-5 lumbar spinal stenosis MRI 08/2011, Diffuse lower thoracic and lumbar DDD   . Mitral valve prolapse    per patient heart murmur  . Orthostatic hypotension 06/12/2013  . Osteoarthritis   . Osteoarthritis of right knee 12/26/2012   Confirmed on xray 07/2014   . OSTEOARTHRITIS, MULTI SITES 05/30/2006   She is stoic about pain and wants to avoid surgeries Past xrays have documented DJD in right knee and lumbar spine    . Overweight (BMI 25.0-29.9) 05/30/2006  . Pitting edema 07/19/2014   Bilateral 1+ pitting edema of calves first noted on 07/19/2014.    . Sciatica 05/30/2006   Qualifier: Diagnosis of  By: Sheffield SliderHale MD, Wayne  Involved left leg in 02/1999   . Swallowing difficulty 07/06/2013  . Urge incontinence 06/01/2008   Qualifier: Diagnosis of  By: Sheffield SliderHale MD, Deniece PortelaWayne      Past Surgical History:  Procedure Laterality Date  . CATARACT EXTRACTION  02/2011   left eye  . TUBAL LIGATION  1973  . VULVA /  PERINEUM BIOPSY  2010   for hypopigmentation, non-specific result    Current Outpatient Prescriptions  Medication Sig Dispense Refill  . acetaminophen (TYLENOL) 650 MG CR tablet Take 2 tablets (1,300 mg total) by mouth every 8 (eight) hours as needed. (Patient taking differently: Take 1,300 mg by mouth every 8 (eight) hours. ) 90 tablet 1  . amLODipine (NORVASC) 5 MG tablet take 1 tablet by mouth once daily 90 tablet 1  . aspirin (BAYER CHILDRENS ASPIRIN) 81 MG chewable tablet Chew 81 mg by mouth daily.      . brimonidine-timolol (COMBIGAN) 0.2-0.5 % ophthalmic solution Place 2 drops into both eyes every 12  (twelve) hours.      . Cholecalciferol (VITAMIN D3) 1000 UNIT capsule Take 1,000 Units by mouth daily.      [name] gabapentin (NEURONTIN) 400 MG capsule Take 2 capsules (800 mg total) by mouth 3 (three) times daily. 180 capsule 1  . hydrochlorothiazide (HYDRODIURIL) 25 MG tablet take 1 tablet by mouth once daily 90 tablet 1  . latanoprost (XALATAN) 0.005 % ophthalmic solution   0  . losartan (COZAAR) 100 MG tablet Take 1 tablet (100 mg total) by mouth daily. 90 tablet 1  . metoprolol succinate (TOPROL-XL) 100 MG 24 hr tablet Take 1 tablet (100 mg total) by mouth daily. Take with or immediately following a meal. 90 tablet 1  . Multiple Vitamin (MULTIVITAMINS PO) Take 1 tablet by mouth daily.      . Omega-3 Fatty Acids (FISH OIL) 1000 MG CAPS Take 2 capsules by mouth daily.      . pantoprazole (PROTONIX) 40 MG tablet Take 40 mg by mouth 2 (two) times daily.    . travoprost, benzalkonium, (TRAVATAN Z) 0.004 % ophthalmic solution Place 1 drop into both eyes at bedtime.      No current facility-administered medications for this visit.     Family History  Problem Relation Age of Onset  . Cancer Sister 32       breast  . Breast cancer Sister        25  . Breast cancer Sister 68  . Stroke Father 52       died  . Hypertension Father   . Stroke Mother 55       cerebral hemorrhage  . Hypertension Mother     ROS:  Pertinent items are noted in HPI.  Otherwise, a comprehensive ROS was negative.  Exam:   BP 122/80 (BP Location: Right Arm, Patient Position: Sitting, Cuff Size: Large)   Pulse 60   Resp 16   Ht  (1.626 m)   Wt 185 lb (83.9 kg)   LMP 04/02/1996   BMI 31.76 kg/m  Height:  (162.6 cm) Ht Readings from Last 3 Encounters:  12/13/16  (1.626 m)  10/19/16  (1.651 m)  03/14/16  (1.651 m)    General appearance: alert, cooperative and appears stated age Head: Normocephalic, without obvious abnormality, atraumatic Neck: no adenopathy, supple, symmetrical,  trachea midline and thyroid normal to inspection and palpation Lungs: clear to auscultation bilaterally Breasts: normal appearance, no masses or tenderness, No nipple retraction or dimpling, No nipple discharge or bleeding, No axillary or supraclavicular adenopathy Heart: regular rate and rhythm Abdomen: soft, non-tender; no masses,  no organomegaly Extremities: extremities normal, atraumatic, no cyanosis or edema Skin: Skin color, texture, turgor normal. No rashes or lesions Lymph nodes: Cervical, supraclavicular, and axillary nodes normal. No abnormal inguinal nodes palpated Neurologic: Grossly normal  Pelvic: External genitalia:  no lesions              Urethra:  normal appearing urethra with no masses, tenderness or lesions              Bartholin's and Skene's: normal                 Vagina: white thick non odorous discharge with atrophic appearing vagina with normal color and , no lesions              Cervix: multiparous appearance, no cervical motion tenderness and no lesions              Pap taken: No. Bimanual Exam:  Uterus:  normal size, contour, position, consistency, mobility, non-tender and anteverted              Adnexa: normal adnexa and no mass, fullness, tenderness               Rectovaginal: Confirms               Anus:  normal sphincter tone, no lesions  Chaperone present: yes  A:  Well Woman with normal exam  Menopausal  No HRT  Atrophic vaginitis  Yeast vaginitis  Hypertension, cholesterol, GERD management with PCP  Family History of Breast cancer sisters age 71, 19, no genetic screening desired.  Immunization update  P:   Reviewed health and wellness pertinent to exam  Aware of need to advise if vaginal bleeding  Discussed vaginal dryness and use of coconut oil for dryness. Patient has this and will start using once yeast treatment completed. Will call if issues.   Discussed yeast finding and etiology with vaginal dryness. Questions addressed.  Rx Terazol 7  vaginal cream see order with instructions  Continue follow up with PCP as indicated  Declines TDAP will do with PCP  Pap smear: no   counseled on breast self exam, mammography screening, feminine hygiene, osteoporosis, adequate intake of calcium and vitamin D, diet and exercise  return annually or prn  An After Visit Summary was printed and given to the patient.

## 2016-12-13 ENCOUNTER — Encounter: Payer: Self-pay | Admitting: Certified Nurse Midwife

## 2016-12-13 ENCOUNTER — Ambulatory Visit (INDEPENDENT_AMBULATORY_CARE_PROVIDER_SITE_OTHER): Payer: Medicare Other | Admitting: Certified Nurse Midwife

## 2016-12-13 VITALS — BP 122/80 | HR 60 | Resp 16 | Ht 64.0 in | Wt 185.0 lb

## 2016-12-13 DIAGNOSIS — N952 Postmenopausal atrophic vaginitis: Secondary | ICD-10-CM

## 2016-12-13 DIAGNOSIS — Z01419 Encounter for gynecological examination (general) (routine) without abnormal findings: Secondary | ICD-10-CM | POA: Diagnosis not present

## 2016-12-13 DIAGNOSIS — B373 Candidiasis of vulva and vagina: Secondary | ICD-10-CM

## 2016-12-13 DIAGNOSIS — Z0000 Encounter for general adult medical examination without abnormal findings: Secondary | ICD-10-CM | POA: Diagnosis not present

## 2016-12-13 DIAGNOSIS — B3731 Acute candidiasis of vulva and vagina: Secondary | ICD-10-CM

## 2016-12-13 DIAGNOSIS — Z124 Encounter for screening for malignant neoplasm of cervix: Secondary | ICD-10-CM | POA: Diagnosis not present

## 2016-12-13 LAB — POCT URINALYSIS DIPSTICK
BILIRUBIN UA: NEGATIVE
GLUCOSE UA: NEGATIVE
Ketones, UA: NEGATIVE
Nitrite, UA: NEGATIVE
Protein, UA: NEGATIVE
RBC UA: NEGATIVE
Urobilinogen, UA: 0.2 E.U./dL
pH, UA: 5 (ref 5.0–8.0)

## 2016-12-13 MED ORDER — TERCONAZOLE 0.4 % VA CREA: 1.0000 | TOPICAL_CREAM | Freq: Every day | VAGINAL | 0 refills | Status: DC

## 2016-12-13 NOTE — Patient Instructions (Signed)

## 2016-12-14 ENCOUNTER — Ambulatory Visit: Payer: Medicare Other | Admitting: Family Medicine

## 2016-12-17 ENCOUNTER — Encounter: Payer: Self-pay | Admitting: Internal Medicine

## 2016-12-17 ENCOUNTER — Ambulatory Visit (INDEPENDENT_AMBULATORY_CARE_PROVIDER_SITE_OTHER): Payer: Medicare Other | Admitting: Internal Medicine

## 2016-12-17 DIAGNOSIS — M1711 Unilateral primary osteoarthritis, right knee: Secondary | ICD-10-CM | POA: Diagnosis not present

## 2016-12-17 DIAGNOSIS — R200 Anesthesia of skin: Secondary | ICD-10-CM | POA: Diagnosis present

## 2016-12-17 DIAGNOSIS — R202 Paresthesia of skin: Secondary | ICD-10-CM | POA: Diagnosis not present

## 2016-12-17 DIAGNOSIS — M542 Cervicalgia: Secondary | ICD-10-CM

## 2016-12-17 MED ORDER — GABAPENTIN 400 MG PO CAPS: 800.0000 mg | ORAL_CAPSULE | Freq: Three times a day (TID) | ORAL | 1 refills | Status: DC

## 2016-12-17 MED ORDER — AMLODIPINE BESYLATE 5 MG PO TABS: 5.0000 mg | ORAL_TABLET | Freq: Every day | ORAL | 3 refills | Status: DC

## 2016-12-17 MED ORDER — HYDROCHLOROTHIAZIDE 25 MG PO TABS: 25.0000 mg | ORAL_TABLET | Freq: Every day | ORAL | 3 refills | Status: DC

## 2016-12-17 NOTE — Progress Notes (Signed)
   Subjective:   Patient: Laura Griffin       Birthdate: 1945-10-04       MRN: [id number]      HPI  MICKIE BADDERS is a 71 y.o. female presenting for f/u of chronic R knee pain, R leg neuropathy, and neck pain.   R knee pain Has been worsening since last visit, however patient realized she is not taking the full amount of Tylenol as prescribed. She bought OTC Tylenol from [employer] because it was cheaper, and as such did not have the dosing instructions. She has only been taking one  tablet every 8 hours, but realizes now that she is actually prescribed two. She thinks by taking the appropriate dose her pain will improve again. Says the pain has not increased in severity, but occurs more frequently. She also thinks the damp weather may be contributing to this, as this typically worsens her pain. Pain located mostly lateral. Describes pain as primarily achy. No pain currently.   R leg numbness/tingling Significantly improved with gabapentin  TID. Says she does occasionally still have some burning in her R leg, but this is occurring less frequently. Intensity of pain is unchanged. Thinks that her symptoms are well-controlled and is not interested in increasing gabapentin any further. Says gabapentin is "doing its job."   Neck pain Chronic issue but says has significantly improved since increasing dose of gabapentin at last visit. Now has increased ROM of her neck and less pain in general. Is very pleased with results.   Smoking status reviewed. Patient is former smoker.   Review of Systems See HPI.     Objective:  Physical Exam  Constitutional: She is oriented to person, place, and time and well-developed, well-nourished, and in no distress.  HENT:  Head: Normocephalic and atraumatic.  Pulmonary/Chest: Effort normal. No respiratory distress.  Musculoskeletal:  Mild TTP of R knee, primarily over patella and laterally to patella. No swelling, erythema, ecchymosis. No crepitus.  Able to sit, stand, and ambulate without assistance.   Neurological: She is alert and oriented to person, place, and time.  Skin: Skin is warm and dry.  Psychiatric: Affect and judgment normal.      Assessment & Plan:  Numbness and tingling of right leg Improvement with increased dose of gabapentin. Still some burning, though less frequent. Patient wishing to remain on current dose.  - Continue gabapentin  TID  Osteoarthritis of right knee Xray obtained at last visit which did not show worsening of OA. Patient taking less Tylenol than was previously prescribed recently, which is likely explanation for worsening pain. Is able to ambulate, sit and stand without assistance. Some TTP on physical exam but no bony abnormalities, swelling, redness, or crepitus appreciated. Patient does not want to pursue further work-up at this time and would prefer instead to resume previously prescribed dose of Tylenol.  - Resume Tylenol  q8  Neck pain Neck pain significantly improved with gabapentin  TID. Pain likely related to cervical DDD.  - Continue gabapentin  Tarri Abernethy, MD, MPH PGY-3 [facility] Pager [phone]

## 2016-12-17 NOTE — Assessment & Plan Note (Signed)
Xray obtained at last visit which did not show worsening of OA. Patient taking less Tylenol than was previously prescribed recently, which is likely explanation for worsening pain. Is able to ambulate, sit and stand without assistance. Some TTP on physical exam but no bony abnormalities, swelling, redness, or crepitus appreciated. Patient does not want to pursue further work-up at this time and would prefer instead to resume previously prescribed dose of Tylenol.  - Resume Tylenol  q8

## 2016-12-17 NOTE — Patient Instructions (Addendum)
It was nice meeting you today Ms. Staley!  For your knee pain, you can take two Tylenol tablets (1300 mg total) every 8 hours.   Please continue taking gabapentin 800 mg every 8 hours as well.   Continue taking your other medications as you were.   If you have any questions or concerns, please feel free to call the clinic.   Be well,  Dr. Natale Milch

## 2016-12-17 NOTE — Assessment & Plan Note (Signed)
Neck pain significantly improved with gabapentin  TID. Pain likely related to cervical DDD.  - Continue gabapentin

## 2016-12-17 NOTE — Assessment & Plan Note (Signed)
Improvement with increased dose of gabapentin. Still some burning, though less frequent. Patient wishing to remain on current dose.  - Continue gabapentin  TID

## 2016-12-21 ENCOUNTER — Ambulatory Visit: Payer: Commercial Indemnity

## 2016-12-25 ENCOUNTER — Ambulatory Visit: Payer: Medicare Other | Admitting: *Deleted

## 2017-01-01 DIAGNOSIS — Z23 Encounter for immunization: Secondary | ICD-10-CM | POA: Diagnosis not present

## 2017-01-04 ENCOUNTER — Ambulatory Visit: Payer: Medicare Other | Admitting: *Deleted

## 2017-01-22 ENCOUNTER — Ambulatory Visit: Payer: Medicare Other | Admitting: *Deleted

## 2017-01-23 ENCOUNTER — Encounter: Payer: Self-pay | Admitting: *Deleted

## 2017-01-23 ENCOUNTER — Ambulatory Visit (INDEPENDENT_AMBULATORY_CARE_PROVIDER_SITE_OTHER): Payer: Medicare Other | Admitting: *Deleted

## 2017-01-23 VITALS — BP 116/76 | HR 57 | Temp 97.7°F | Ht 64.0 in | Wt 186.0 lb

## 2017-01-23 DIAGNOSIS — Z0000 Encounter for general adult medical examination without abnormal findings: Secondary | ICD-10-CM | POA: Diagnosis not present

## 2017-01-23 NOTE — Patient Instructions (Addendum)
Laura Griffin,  Thank you for taking time to come for yourMedicare Wellness Visit. I appreciate your ongoing commitment to your health goals. Please review the following plan we discussed and let me know if I can assist you in the future.   These are the goals we discussed:  Goals    . Blood Pressure < 140/90    . Eat more fruits and vegetables    . Exercise 3x per week (30 min per time)    . Weight (lb) < 176 lb (79.8 kg) (pt-stated)          5 % weight loss         Fall Prevention in the Home Falls can cause injuries. They can happen to people of all ages. There are many things you can do to make your home safe and to help prevent falls. What can I do on the outside of my home?  Regularly fix the edges of walkways and driveways and fix any cracks.  Remove anything that might make you trip as you walk through a door, such as a raised step or threshold.  Trim any bushes or trees on the path to your home.  Use bright outdoor lighting.  Clear any walking paths of anything that might make someone trip, such as rocks or tools.  Regularly check to see if handrails are loose or broken. Make sure that both sides of any steps have handrails.  Any raised decks and porches should have guardrails on the edges.  Have any leaves, snow, or ice cleared regularly.  Use sand or salt on walking paths during winter.  Clean up any spills in your garage right away. This includes oil or grease spills. What can I do in the bathroom?  Use night lights.  Install grab bars by the toilet and in the tub and shower. Do not use towel bars as grab bars.  Use non-skid mats or decals in the tub or shower.  If you need to sit down in the shower, use a plastic, non-slip stool.  Keep the floor dry. Clean up any water that spills on the floor as soon as it happens.  Remove soap buildup in the tub or shower regularly.  Attach bath mats securely with double-sided non-slip rug tape.  Do not  have throw rugs and other things on the floor that can make you trip. What can I do in the bedroom?  Use night lights.  Make sure that you have a light by your bed that is easy to reach.  Do not use any sheets or blankets that are too big for your bed. They should not hang down onto the floor.  Have a firm chair that has side arms. You can use this for support while you get dressed.  Do not have throw rugs and other things on the floor that can make you trip. What can I do in the kitchen?  Clean up any spills right away.  Avoid walking on wet floors.  Keep items that you use a lot in easy-to-reach places.  If you need to reach something above you, use a strong step stool that has a grab bar.  Keep electrical cords out of the way.  Do not use floor polish or wax that makes floors slippery. If you must use wax, use non-skid floor wax.  Do not have throw rugs and other things on the floor that can make you trip. What can I do with my stairs?  Do  not leave any items on the stairs.  Make sure that there are handrails on both sides of the stairs and use them. Fix handrails that are broken or loose. Make sure that handrails are as long as the stairways.  Check any carpeting to make sure that it is firmly attached to the stairs. Fix any carpet that is loose or worn.  Avoid having throw rugs at the top or bottom of the stairs. If you do have throw rugs, attach them to the floor with carpet tape.  Make sure that you have a light switch at the top of the stairs and the bottom of the stairs. If you do not have them, ask someone to add them for you. What else can I do to help prevent falls?  Wear shoes that: ? Do not have high heels. ? Have rubber bottoms. ? Are comfortable and fit you well. ? Are closed at the toe. Do not wear sandals.  If you use a stepladder: ? Make sure that it is fully opened. Do not climb a closed stepladder. ? Make sure that both sides of the stepladder are  locked into place. ? Ask someone to hold it for you, if possible.  Clearly mark and make sure that you can see: ? Any grab bars or handrails. ? First and last steps. ? Where the edge of each step is.  Use tools that help you move around (mobility aids) if they are needed. These include: ? Canes. ? Walkers. ? Scooters. ? Crutches.  Turn on the lights when you go into a dark area. Replace any light bulbs as soon as they burn out.  Set up your furniture so you have a clear path. Avoid moving your furniture around.  If any of your floors are uneven, fix them.  If there are any pets around you, be aware of where they are.  Review your medicines with your doctor. Some medicines can make you feel dizzy. This can increase your chance of falling. Ask your doctor what other things that you can do to help prevent falls. This information is not intended to replace advice given to you by your health care provider. Make sure you discuss any questions you have with your health care provider. Document Released: 01/13/2009 Document Revised: 08/25/2015 Document Reviewed: 04/23/2014 Elsevier Interactive Patient Education  2018 [place] Maintenance, Female Adopting a healthy lifestyle and getting preventive care can go a long way to promote health and wellness. Talk with your health care provider about what schedule of regular examinations is right for you. This is a good chance for you to check in with your provider about disease prevention and staying healthy. In between checkups, there are plenty of things you can do on your own. Experts have done a lot of research about which lifestyle changes and preventive measures are most likely to keep you healthy. Ask your health care provider for more information. Weight and diet Eat a healthy diet  Be sure to include plenty of vegetables, fruits, low-fat dairy products, and lean protein.  Do not eat a lot of foods high in solid fats, added  sugars, or salt.  Get regular exercise. This is one of the most important things you can do for your health. ? Most adults should exercise for at least 150 minutes each week. The exercise should increase your heart rate and make you sweat (moderate-intensity exercise). ? Most adults should also do strengthening exercises at least twice a week. This  is in addition to the moderate-intensity exercise.  Maintain a healthy weight  Body mass index (BMI) is a measurement that can be used to identify possible weight problems. It estimates body fat based on height and weight. Your health care provider can help determine your BMI and help you achieve or maintain a healthy weight.  For females 73 years of age and older: ? A BMI below 18.5 is considered underweight. ? A BMI of 18.5 to 24.9 is normal. ? A BMI of 25 to 29.9 is considered overweight. ? A BMI of 30 and above is considered obese.  Watch levels of cholesterol and blood lipids  You should start having your blood tested for lipids and cholesterol at 73 years of age, then have this test every 5 years.  You may need to have your cholesterol levels checked more often if: ? Your lipid or cholesterol levels are high. ? You are older than 73 years of age. ? You are at high risk for heart disease.  Cancer screening Lung Cancer  Lung cancer screening is recommended for adults 80-17 years old who are at high risk for lung cancer because of a history of smoking.  A yearly low-dose CT scan of the lungs is recommended for people who: ? Currently smoke. ? Have quit within the past 15 years. ? Have at least a 30-pack-year history of smoking. A pack year is smoking an average of one pack of cigarettes a day for 1 year.  Yearly screening should continue until it has been 15 years since you quit.  Yearly screening should stop if you develop a health problem that would prevent you from having lung cancer treatment.  Breast Cancer  Practice breast  self-awareness. This means understanding how your breasts normally appear and feel.  It also means doing regular breast self-exams. Let your health care provider know about any changes, no matter how small.  If you are in your 20s or 30s, you should have a clinical breast exam (CBE) by a health care provider every 1-3 years as part of a regular health exam.  If you are 46 or older, have a CBE every year. Also consider having a breast X-ray (mammogram) every year.  If you have a family history of breast cancer, talk to your health care provider about genetic screening.  If you are at high risk for breast cancer, talk to your health care provider about having an MRI and a mammogram every year.  Breast cancer gene (BRCA) assessment is recommended for women who have family members with BRCA-related cancers. BRCA-related cancers include: ? Breast. ? Ovarian. ? Tubal. ? Peritoneal cancers.  Results of the assessment will determine the need for genetic counseling and BRCA1 and BRCA2 testing.  Cervical Cancer Your health care provider may recommend that you be screened regularly for cancer of the pelvic organs (ovaries, uterus, and vagina). This screening involves a pelvic examination, including checking for microscopic changes to the surface of your cervix (Pap test). You may be encouraged to have this screening done every 3 years, beginning at age 57.  For women ages 49-65, health care providers may recommend pelvic exams and Pap testing every 3 years, or they may recommend the Pap and pelvic exam, combined with testing for human papilloma virus (HPV), every 5 years. Some types of HPV increase your risk of cervical cancer. Testing for HPV may also be done on women of any age with unclear Pap test results.  Other health care providers may  not recommend any screening for nonpregnant women who are considered low risk for pelvic cancer and who do not have symptoms. Ask your health care provider if a  screening pelvic exam is right for you.  If you have had past treatment for cervical cancer or a condition that could lead to cancer, you need Pap tests and screening for cancer for at least 20 years after your treatment. If Pap tests have been discontinued, your risk factors (such as having a new sexual partner) need to be reassessed to determine if screening should resume. Some women have medical problems that increase the chance of getting cervical cancer. In these cases, your health care provider may recommend more frequent screening and Pap tests.  Colorectal Cancer  This type of cancer can be detected and often prevented.  Routine colorectal cancer screening usually begins at 73 years of age and continues through 73 years of age.  Your health care provider may recommend screening at an earlier age if you have risk factors for colon cancer.  Your health care provider may also recommend using home test kits to check for hidden blood in the stool.  A small camera at the end of a tube can be used to examine your colon directly (sigmoidoscopy or colonoscopy). This is done to check for the earliest forms of colorectal cancer.  Routine screening usually begins at age 28.  Direct examination of the colon should be repeated every 5-10 years through 73 years of age. However, you may need to be screened more often if early forms of precancerous polyps or small growths are found.  Skin Cancer  Check your skin from head to toe regularly.  Tell your health care provider about any new moles or changes in moles, especially if there is a change in a mole's shape or color.  Also tell your health care provider if you have a mole that is larger than the size of a pencil eraser.  Always use sunscreen. Apply sunscreen liberally and repeatedly throughout the day.  Protect yourself by wearing long sleeves, pants, a wide-brimmed hat, and sunglasses whenever you are outside.  Heart disease, diabetes, and  high blood pressure  High blood pressure causes heart disease and increases the risk of stroke. High blood pressure is more likely to develop in: ? People who have blood pressure in the high end of the normal range (130-139/85-89 mm Hg). ? People who are overweight or obese. ? People who are African American.  If you are 59-61 years of age, have your blood pressure checked every 3-5 years. If you are 75 years of age or older, have your blood pressure checked every year. You should have your blood pressure measured twice-once when you are at a hospital or clinic, and once when you are not at a hospital or clinic. Record the average of the two measurements. To check your blood pressure when you are not at a hospital or clinic, you can use: ? An automated blood pressure machine at a pharmacy. ? A home blood pressure monitor.  If you are between 69 years and 72 years old, ask your health care provider if you should take aspirin to prevent strokes.  Have regular diabetes screenings. This involves taking a blood sample to check your fasting blood sugar level. ? If you are at a normal weight and have a low risk for diabetes, have this test once every three years after 73 years of age. ? If you are overweight and have a  high risk for diabetes, consider being tested at a younger age or more often. Preventing infection Hepatitis B  If you have a higher risk for hepatitis B, you should be screened for this virus. You are considered at high risk for hepatitis B if: ? You were born in a country where hepatitis B is common. Ask your health care provider which countries are considered high risk. ? Your parents were born in a high-risk country, and you have not been immunized against hepatitis B (hepatitis B vaccine). ? You have HIV or AIDS. ? You use needles to inject street drugs. ? You live with someone who has hepatitis B. ? You have had sex with someone who has hepatitis B. ? You get hemodialysis  treatment. ? You take certain medicines for conditions, including cancer, organ transplantation, and autoimmune conditions.  Hepatitis C  Blood testing is recommended for: ? Everyone born from [birth year] through 1967. ? Anyone with known risk factors for hepatitis C.  Sexually transmitted infections (STIs)  You should be screened for sexually transmitted infections (STIs) including gonorrhea and chlamydia if: ? You are sexually active and are younger than 73 years of age. ? You are older than 73 years of age and your health care provider tells you that you are at risk for this type of infection. ? Your sexual activity has changed since you were last screened and you are at an increased risk for chlamydia or gonorrhea. Ask your health care provider if you are at risk.  If you do not have HIV, but are at risk, it may be recommended that you take a prescription medicine daily to prevent HIV infection. This is called pre-exposure prophylaxis (PrEP). You are considered at risk if: ? You are sexually active and do not regularly use condoms or know the HIV status of your partner(s). ? You take drugs by injection. ? You are sexually active with a partner who has HIV.  Talk with your health care provider about whether you are at high risk of being infected with HIV. If you choose to begin PrEP, you should first be tested for HIV. You should then be tested every 3 months for as long as you are taking PrEP. Pregnancy  If you are premenopausal and you may become pregnant, ask your health care provider about preconception counseling.  If you may become pregnant, take 400 to 800 micrograms (mcg) of folic acid every day.  If you want to prevent pregnancy, talk to your health care provider about birth control (contraception). Osteoporosis and menopause  Osteoporosis is a disease in which the bones lose minerals and strength with aging. This can result in serious bone fractures. Your risk for osteoporosis  can be identified using a bone density scan.  If you are 70 years of age or older, or if you are at risk for osteoporosis and fractures, ask your health care provider if you should be screened.  Ask your health care provider whether you should take a calcium or vitamin D supplement to lower your risk for osteoporosis.  Menopause may have certain physical symptoms and risks.  Hormone replacement therapy may reduce some of these symptoms and risks. Talk to your health care provider about whether hormone replacement therapy is right for you. Follow these instructions at home:  Schedule regular health, dental, and eye exams.  Stay current with your immunizations.  Do not use any tobacco products including cigarettes, chewing tobacco, or electronic cigarettes.  If you are pregnant, do not  drink alcohol.  If you are breastfeeding, limit how much and how often you drink alcohol.  Limit alcohol intake to no more than 1 drink per day for nonpregnant women. One drink equals 12 ounces of beer, 5 ounces of wine, or 1 ounces of hard liquor.  Do not use street drugs.  Do not share needles.  Ask your health care provider for help if you need support or information about quitting drugs.  Tell your health care provider if you often feel depressed.  Tell your health care provider if you have ever been abused or do not feel safe at home. This information is not intended to replace advice given to you by your health care provider. Make sure you discuss any questions you have with your health care provider. Document Released: 10/02/2010 Document Revised: 08/25/2015 Document Reviewed: 12/21/2014 Elsevier Interactive Patient Education  [employer].

## 2017-01-23 NOTE — Progress Notes (Signed)
Subjective:   Laura Griffin is a 71 y.o. female who presents for Medicare Annual (Subsequent) preventive examination.  Cardiac Risk Factors include: advanced age (>38men, >103 women);hypertension;obesity (BMI >30kg/m2);sedentary lifestyle;smoking/ tobacco exposure     Objective:     Vitals: BP 116/76 (BP Location: Right Arm, Patient Position: Sitting, Cuff Size: Normal)   Pulse (!) 57   Temp 97.7 F (36.5 C) (Oral)   Ht 5\' 4"  (1.626 m)   Wt 186 lb (84.4 kg)   LMP 04/02/1996   SpO2 98%   BMI 31.93 kg/m   Body mass index is 31.93 kg/m.   Tobacco History  Smoking Status  . Former Smoker  . Packs/day: 0.50  . Years: 15.00  . Types: Cigarettes  . Quit date: 04/02/1994  Smokeless Tobacco  . Never Used     Counseling given: Yes Patient is former smoker with no plans to restart   Past Medical History:  Diagnosis Date  . Allergic rhinitis 07/19/2014  . Arthritis of knee, right 03/11/2006  . Bursitis of shoulder, right 09/19/1999  . Cervical radiculopathy at C6 05/24/2011   Beginning Nov 2012. Multi-level foraminal narrowing in C-spine plain films   . Diverticulosis   . DIVERTICULOSIS OF COLON 05/30/2006   Colonoscopy 06/2014 - multiple diverticula, no other abnormalities, no further colonoscopies recommended given patient's age of 27    . GERD (gastroesophageal reflux disease) 07/19/2014   Diagnosed clinically by [facility] in January of 2016.    . Glaucoma   . Hearing decreased 08/07/2011   With whooshing tinnitus Mild bilateral loss on 08/2012 audiology who will recheck her hearing in one year    . History of herniated intervertebral disc   . Hypertension   . HYPERTENSION, BENIGN SYSTEMIC 05/30/2006       . Left sided sciatica 8/95,11/00   CT confirmed   . LOW BACK PAIN SYNDROME 05/10/2009   Qualifier: Diagnosis of  By: Lorenda Hatchet CMA,, [place]  Mild C 3-4, 4-5 lumbar spinal stenosis MRI 08/2011, Diffuse lower thoracic and lumbar DDD   . Mitral valve prolapse    per patient heart murmur  . Orthostatic hypotension 06/12/2013  . Osteoarthritis   . Osteoarthritis of right knee 12/26/2012   Confirmed on xray 07/2014   . OSTEOARTHRITIS, MULTI SITES 05/30/2006   She is stoic about pain and wants to avoid surgeries Past xrays have documented DJD in right knee and lumbar spine    . Overweight (BMI 25.0-29.9) 05/30/2006  . Pitting edema 07/19/2014   Bilateral 1+ pitting edema of calves first noted on 07/19/2014.    . Sciatica 05/30/2006   Qualifier: Diagnosis of  By: Sheffield Slider MD, Wayne  Involved left leg in 02/1999   . Swallowing difficulty 07/06/2013  . Urge incontinence 06/01/2008   Qualifier: Diagnosis of  By: Sheffield Slider MD, Deniece Portela     Past Surgical History:  Procedure Laterality Date  . CATARACT EXTRACTION  02/2011   left eye  . TUBAL LIGATION  1973  . VULVA /PERINEUM BIOPSY  2010   for hypopigmentation, non-specific result   Family History  Problem Relation Age of Onset  . Cancer Sister 77       breast  . Breast cancer Sister        59  . Breast cancer Sister 72  . Hypertension Father   . Heart disease Father   . Stroke Mother 22       cerebral hemorrhage  . Hypertension Mother    History  Sexual Activity  .  Sexual activity: No    Comment: BTSP    Outpatient Encounter Prescriptions as of 01/23/2017  Medication Sig  . acetaminophen (TYLENOL) 650 MG CR tablet Take 2 tablets (1,300 mg total) by mouth every 8 (eight) hours as needed. (Patient taking differently: Take 1,300 mg by mouth every 8 (eight) hours. )  . amLODipine (NORVASC) 5 MG tablet Take 1 tablet (5 mg total) by mouth daily.  [name]. aspirin (BAYER CHILDRENS ASPIRIN) 81 MG chewable tablet Chew 81 mg by mouth daily.    . brimonidine-timolol (COMBIGAN) 0.2-0.5 % ophthalmic solution Place 2 drops into both eyes every 12 (twelve) hours.    . Cholecalciferol (VITAMIN D3) 1000 UNIT capsule Take 1,000 Units by mouth daily.    [name]. gabapentin (NEURONTIN) 400 MG capsule Take 2 capsules (800 mg total) by mouth  3 (three) times daily.  . hydrochlorothiazide (HYDRODIURIL) 25 MG tablet Take 1 tablet (25 mg total) by mouth daily.  [name]. latanoprost (XALATAN) 0.005 % ophthalmic solution   . losartan (COZAAR) 100 MG tablet Take 1 tablet (100 mg total) by mouth daily.  . metoprolol succinate (TOPROL-XL) 100 MG 24 hr tablet Take 1 tablet (100 mg total) by mouth daily. Take with or immediately following a meal.  . Multiple Vitamin (MULTIVITAMINS PO) Take 1 tablet by mouth daily.    . Omega-3 Fatty Acids (FISH OIL) 1000 MG CAPS Take 2 capsules by mouth daily.    . pantoprazole (PROTONIX) 40 MG tablet Take 40 mg by mouth 2 (two) times daily.  . [DISCONTINUED] terconazole (TERAZOL 7) 0.4 % vaginal cream Place 1 applicator vaginally at bedtime. (Patient not taking: Reported on 01/23/2017)  . [DISCONTINUED] travoprost, benzalkonium, (TRAVATAN Z) 0.004 % ophthalmic solution Place 1 drop into both eyes at bedtime.    No facility-administered encounter medications on file as of 01/23/2017.     Activities of Daily Living In your present state of health, do you have any difficulty performing the following activities: 01/23/2017  Hearing? Y  Vision? Y  Difficulty concentrating or making decisions? N  Walking or climbing stairs? Y  Dressing or bathing? N  Doing errands, shopping? N  Preparing Food and eating ? N  Using the Toilet? N  In the past six months, have you accidently leaked urine? Y  Do you have problems with loss of bowel control? N  Managing your Medications? N  Managing your Finances? N  Housekeeping or managing your Housekeeping? N  Some recent data might be hidden   Home Safety:  My home has a working smoke alarm:  Yes X 1           My home throw rugs have been fastened down to the floor or removed:  Taped down I have a non-slip surface or non-slip mats in the bathtub and shower:  Mat        All my home's stairs have handrails, including any outdoor stairs  One level home with 3 outside steps with  handrails          My home's floors, stairs and hallways are free from clutter, wires and cords:  Yes     I have animals in my home  No I wear seatbelts consistently:  Yes   Patient Care Team: Latrelle DodrillMcIntyre, Brittany J, MD as PCP - General (Family Medicine) Althea CharonBertrand, Margaret Lins, MD (Radiology) Glenford PeersMcFarland, Howard, OD (Optometry) Iona HansenHyman, Mark, DDS (Dentistry) Bernette RedbirdBuccini, Robert, MD as Consulting Physician (Gastroenterology) Jerene BearsMiller, Mary S, MD as Consulting Physician (Gynecology) Verner CholLeonard, Deborah S, CNM as  Referring Physician (Certified Nurse Midwife)  Dr. [facility]   Assessment:     Exercise Activities and Dietary recommendations Current Exercise Habits: The patient does not participate in regular exercise at present, Exercise limited by: orthopedic condition(s)  Goals    . Blood Pressure < 140/90    . Eat more fruits and vegetables    . Exercise 3x per week (30 min per time)    . Weight (lb) < 176 lb (79.8 kg) (pt-stated)          5 % weight loss      Fall Risk Fall Risk  01/23/2017 12/17/2016 10/19/2016 09/23/2015 02/08/2015  Falls in the past year? No No No No No  Risk for fall due to : - - - - -   TUG Test:  Done in 22 seconds. Patient used both hands to push out of chair and to sit back down. Ambulated with cane in left hand. Patient very stiff upon standing. Slow tentative pace noted. Discussed increasing walking to improve mobility. [place] [facility] [place] 5 [facility] per [facility]. Will think about joining him. Falls prevention discussed in detail and literature given.  Cognitive Function: Mini-Cog  Passed with score 5/5   Depression Screen PHQ 2/9 Scores 01/23/2017 12/17/2016 10/19/2016 03/01/2016  PHQ - 2 Score 0 0 0 0  PHQ- 9 Score - - - 0     Cognitive Function MMSE - Mini Mental State Exam 01/06/2015 07/09/2013 10/23/2010  Not completed: (No Data) - -  Orientation to time - 5 5  Orientation to Place - 5 5  Registration - 3 3  Attention/ Calculation - 5 5  Recall - 3 3    Language- name 2 objects - 2 2  Language- repeat - 1 1  Language- follow 3 step command - 3 3  Language- read & follow direction - 1 1  Write a sentence - 1 1  Copy design - 1 1  Total score - 30 30        Immunization History  Administered Date(s) Administered  . Influenza Split 02/20/2011, 01/16/2012  . Influenza Whole 01/17/2009, 01/10/2010  . Influenza,inj,Quad PF,6+ Mos 12/26/2012, 12/21/2013, 01/06/2015, 01/05/2016  . Pneumococcal Conjugate-13 07/09/2013  . Pneumococcal Polysaccharide-23 07/13/2008  . Td 04/02/2004  . Tdap 04/02/2006  . Zoster 04/02/2012   Screening Tests Health Maintenance  Topic Date Due  . TETANUS/TDAP  04/02/2016  . INFLUENZA VACCINE  10/31/2016  . MAMMOGRAM  07/25/2017  . COLONOSCOPY  06/08/2024  . DEXA SCAN  Completed  . Hepatitis C Screening  Completed  . PNA vac Low Risk Adult  Completed  Patient received flu vaccine at [facility] on 12/25/2016. Added to historical immunizations. Discussed receiving TDaP at local pharmacy    Plan:      I have personally reviewed and noted the following in the patient's chart:   . Medical and social history . Use of alcohol, tobacco or illicit drugs  . Current medications and supplements . Functional ability and status . Nutritional status . Physical activity . Advanced directives . List of other physicians . Hospitalizations, surgeries, and ER visits in previous 12 months . Vitals . Screenings to include cognitive, depression, and falls . Referrals and appointments  In addition, I have reviewed and discussed with patient certain preventive protocols, quality metrics, and best practice recommendations. A written personalized care plan for preventive services as well as general preventive health recommendations were provided to patient.     Fredderick Severance, RN  01/23/2017     

## 2017-01-29 ENCOUNTER — Encounter: Payer: Self-pay | Admitting: *Deleted

## 2017-01-30 ENCOUNTER — Encounter: Payer: Self-pay | Admitting: *Deleted

## 2017-01-30 NOTE — Progress Notes (Signed)
I have reviewed this visit and discussed with Lauren Ducatte, RN, BSN, and agree with her documentation.   Brittany J McIntyre, MD  

## 2017-03-21 ENCOUNTER — Telehealth: Payer: Self-pay | Admitting: Family Medicine

## 2017-03-21 NOTE — Telephone Encounter (Signed)
Patient called & left a message on the nurse line saying that a pharmacist was refusing to fill one of her blood pressure medications due to a recall. We are not aware of any recalls involving her medications. I called her pharmacy [facility] [place]) and they confirmed none of her medications were under recall.  They did apparently have the incorrect DEA # listed for me and therefore weren't able to run her HCTZ. This is now fixed.  Lauren, can you touch base w/ patient about this? If the issue was filling it due to a clerical error in their records for my DEA #, it may now be fixed.  Thanks Laura DodrillBrittany J McIntyre, MD

## 2017-03-21 NOTE — Telephone Encounter (Signed)
Left message on patient's VM requesting return call to relay info below. Kinnie FeilL. Deaisha Welborn, RN, BSN

## 2017-03-21 NOTE — Telephone Encounter (Signed)
Pt informed. Dannette Kinkaid Dawn, CMA  

## 2017-04-19 ENCOUNTER — Telehealth: Payer: Self-pay | Admitting: Family Medicine

## 2017-04-19 ENCOUNTER — Other Ambulatory Visit: Payer: Self-pay

## 2017-04-19 ENCOUNTER — Ambulatory Visit (INDEPENDENT_AMBULATORY_CARE_PROVIDER_SITE_OTHER): Payer: Medicare Other | Admitting: Family Medicine

## 2017-04-19 ENCOUNTER — Encounter: Payer: Self-pay | Admitting: Family Medicine

## 2017-04-19 VITALS — BP 120/80 | HR 50 | Temp 97.4°F | Ht 64.0 in | Wt 187.4 lb

## 2017-04-19 DIAGNOSIS — I10 Essential (primary) hypertension: Secondary | ICD-10-CM

## 2017-04-19 DIAGNOSIS — R350 Frequency of micturition: Secondary | ICD-10-CM

## 2017-04-19 DIAGNOSIS — N3941 Urge incontinence: Secondary | ICD-10-CM

## 2017-04-19 DIAGNOSIS — E041 Nontoxic single thyroid nodule: Secondary | ICD-10-CM | POA: Diagnosis not present

## 2017-04-19 LAB — POCT URINALYSIS DIP (MANUAL ENTRY)
BILIRUBIN UA: NEGATIVE
GLUCOSE UA: NEGATIVE mg/dL
Ketones, POC UA: NEGATIVE mg/dL
NITRITE UA: NEGATIVE
RBC UA: NEGATIVE
Spec Grav, UA: 1.015 (ref 1.010–1.025)
Urobilinogen, UA: 0.2 E.U./dL
pH, UA: 6 (ref 5.0–8.0)

## 2017-04-19 MED ORDER — TETANUS-DIPHTH-ACELL PERTUSSIS 5-2.5-18.5 LF-MCG/0.5 IM SUSP: 0.5000 mL | Freq: Once | INTRAMUSCULAR | 0 refills | Status: AC

## 2017-04-19 NOTE — Assessment & Plan Note (Signed)
Nonpalpable on exam today.  Reviewed options for surveillance with patient including repeating ultrasound versus exams.  She prefers to do clinical exams for a year, and will consider having an ultrasound in 1 year.

## 2017-04-19 NOTE — Telephone Encounter (Signed)
Attempted to reach patient to tell her also to get some blood work from her, which I forgot to mention during today's visit.  She has a lab visit scheduled for next Wednesday.  No interim sound, and no voicemail set up.  I will try to call her on Tuesday when I return.  Want to check CMET and lipids, as it's been a while since she had these.  Latrelle DodrillBrittany J Michalla Ringer, MD

## 2017-04-19 NOTE — Patient Instructions (Addendum)
[facility] is going to call you about your medications to ensure they weren't impacted by the recall.  Come back another time and give us a bigger sample of urine  Call your insurance company about incontinence medications - find out which they cover the best.  Follow up with me in 3 months, sooner if needed.  Be well, Dr. Pollie MeyerMcIntyre

## 2017-04-19 NOTE — Assessment & Plan Note (Signed)
Well-controlled on current regimen.  I called her pharmacy to inquire whether her losartan was impacted by the recall.  The pharmacist stated they would call patient later today after reviewing lot numbers.  Patient was pleased with this

## 2017-04-19 NOTE — Assessment & Plan Note (Signed)
Worsening symptoms lately.  Wearing incontinent supplies frequently.  Urinalysis obtained today but unfortunately quantity was insufficient for microscopic evaluation.  Significant for trace protein and some leukocytes.  After discussion of options with patient, we decided to have her return for a lab visit to obtain another urinalysis when she can give a larger sample.  Want to rule out infection.  She is also interested in medication therapy for incontinence, but was not able to afford Myrbetriq.  She will call her insurance company and find out what options are most affordable and let me know.

## 2017-04-19 NOTE — Progress Notes (Signed)
Date of Visit: 04/19/2017   HPI:  Patient presents for routine follow-up.  Hypertension: Currently taking losartan 100 mg daily, metoprolol XL 100 mg daily, amlodipine 5 mg daily, and HCTZ 25 mg daily.  Tolerating all these medications well.  No chest pain or shortness of breath.  Does endorse being concerned about the recent recall on losartan products.  She is concerned that perhaps her medications were affected.  Urinary incontinence: Previously took Myrbetriq for this with improvement but it became too expensive for her to take.  Over the last 5-6 months has had increased urinary frequency and urgency.  No stress incontinence symptoms.  Occasionally has dysuria, though it is not every day.  Is not interested in any surgical procedures for incontinence.  Thyroid nodule: Noted on problem list.  Had thyroid ultrasound done on February 2017 to evaluate for possible right-sided thyromegaly.  Thyroid was normal except for left sided nodule that did not meet criteria for biopsy.  Patient denies any history of radiation to the head or neck, also denies any family history of thyroid cancer, or personal history of other cancer.  ROS: See HPI.  PMFSH: History of allergic rhinitis, GERD, glaucoma, hypertension, osteoarthritis, thyroid nodule, urge incontinence  PHYSICAL EXAM: BP 120/80   Pulse (!) 50   Temp (!) 97.4 F (36.3 C) (Oral)   Ht 5\' 4"  (1.626 m)   Wt 187 lb 6.4 oz (85 kg)   LMP 04/02/1996   SpO2 98%   BMI 32.17 kg/m  Gen: no acute distress, pleasant, cooperative, well appearing HEENT: normocephalic, atraumatic, moist mucous membranes. Mild fullness of R thyroid. No nodules palpable either right side or left.  Heart: regular rate and rhythm, no murmur Lungs: clear to auscultation bilaterally normal work of breathing  Neuro: alert, grossly nonfocal, speech normal Ext: No appreciable lower extremity edema bilaterally   ASSESSMENT/PLAN:  Health maintenance:  -printed rx for Tdap &  instructed patient to take to her pharmacy -reviewed risks/benefits of new shingles vaccine (patient inquired about it) - discussed significant post immunization pain/discomfort as risk. Patient will think about it. -otherwise UTD on HM items  Thyroid nodule Nonpalpable on exam today.  Reviewed options for surveillance with patient including repeating ultrasound versus exams.  She prefers to do clinical exams for a year, and will consider having an ultrasound in 1 year.  HYPERTENSION, BENIGN SYSTEMIC Well-controlled on current regimen.  I called her pharmacy to inquire whether her losartan was impacted by the recall.  The pharmacist stated they would call patient later today after reviewing lot numbers.  Patient was pleased with this  URGE INCONTINENCE Worsening symptoms lately.  Wearing incontinent supplies frequently.  Urinalysis obtained today but unfortunately quantity was insufficient for microscopic evaluation.  Significant for trace protein and some leukocytes.  After discussion of options with patient, we decided to have her return for a lab visit to obtain another urinalysis when she can give a larger sample.  Want to rule out infection.  She is also interested in medication therapy for incontinence, but was not able to afford Myrbetriq.  She will call her insurance company and find out what options are most affordable and let me know.  FOLLOW UP: Follow up in 3 months for above issues Schedule lab visit  GrenadaBrittany J. Pollie MeyerMcIntyre, MD [facility]

## 2017-04-23 ENCOUNTER — Other Ambulatory Visit: Payer: Self-pay

## 2017-04-24 ENCOUNTER — Other Ambulatory Visit (INDEPENDENT_AMBULATORY_CARE_PROVIDER_SITE_OTHER): Payer: Medicare Other

## 2017-04-24 DIAGNOSIS — R350 Frequency of micturition: Secondary | ICD-10-CM

## 2017-04-24 LAB — POCT UA - MICROSCOPIC ONLY

## 2017-04-24 LAB — POCT URINALYSIS DIPSTICK
Bilirubin, UA: NEGATIVE
Glucose, UA: NEGATIVE
Ketones, UA: NEGATIVE
NITRITE UA: NEGATIVE
PROTEIN UA: NEGATIVE
RBC UA: NEGATIVE
Spec Grav, UA: 1.015 (ref 1.010–1.025)
Urobilinogen, UA: 0.2 E.U./dL
pH, UA: 6 (ref 5.0–8.0)

## 2017-04-26 MED ORDER — METOPROLOL SUCCINATE ER 100 MG PO TB24: 100.0000 mg | ORAL_TABLET | Freq: Every day | ORAL | 1 refills | Status: DC

## 2017-04-30 ENCOUNTER — Telehealth: Payer: Self-pay | Admitting: *Deleted

## 2017-04-30 NOTE — Telephone Encounter (Signed)
Patient called in states she has received 2 letters from her [facility] pharmacist that her losartan is on the recall list. No substitute was suggested. Please advise. Laura FeilL. Ducatte, RN, BSN

## 2017-04-30 NOTE — Telephone Encounter (Signed)
Attempted again to reach patient, no answer.   Laura DodrillBrittany J Bijan Ridgley, MD

## 2017-05-01 NOTE — Telephone Encounter (Signed)
Attempted to reach patient to discuss (called at [phone]), no answer. Left voicemail asking her to call back. Latrelle DodrillBrittany J Lexani Corona, MD

## 2017-05-28 ENCOUNTER — Other Ambulatory Visit: Payer: Self-pay | Admitting: Family Medicine

## 2017-05-31 ENCOUNTER — Encounter: Payer: Self-pay | Admitting: Family Medicine

## 2017-06-04 ENCOUNTER — Telehealth: Payer: Self-pay

## 2017-06-04 NOTE — Telephone Encounter (Signed)
Pt called nurse line, would like to speak with MD regarding her Losartan- she is aware of the recall and had questions about changing to a different med vs continuing with current regimen. Pt can be reached at [phone] Shawna OrleansMeredith B Aydian Dimmick, RN

## 2017-06-06 NOTE — Telephone Encounter (Signed)
Spoke with her She stopped her losartan on Sunday - 4 days ago  She checked her blood pressure yesterday and was good We decided to monitor her blood pressure and she would call if either number exceeded 140/90 If so likely would start candesartan

## 2017-06-14 ENCOUNTER — Other Ambulatory Visit: Payer: Self-pay | Admitting: Internal Medicine

## 2017-07-02 ENCOUNTER — Ambulatory Visit (INDEPENDENT_AMBULATORY_CARE_PROVIDER_SITE_OTHER): Payer: Medicare Other | Admitting: Family Medicine

## 2017-07-02 ENCOUNTER — Encounter: Payer: Self-pay | Admitting: Family Medicine

## 2017-07-02 VITALS — BP 118/80 | HR 58 | Temp 98.6°F | Ht 64.0 in | Wt 187.2 lb

## 2017-07-02 DIAGNOSIS — I10 Essential (primary) hypertension: Secondary | ICD-10-CM

## 2017-07-02 DIAGNOSIS — N3941 Urge incontinence: Secondary | ICD-10-CM | POA: Diagnosis not present

## 2017-07-02 NOTE — Progress Notes (Signed)
Date of Visit: 07/02/2017   HPI:  [name] is a 72 y.o. woman here today for follow up of chronic medical conditions.  Hypertension: Patient reports that she stopped taking losartan at the end of January due to the recalls on this medication. She has been taking blood pressure at home regularly, and highest systolic has been 131. Diastolic number is usually in high 70s, low 80s. Denies chest pain or shortness of breath. Currently taking amlodipine 5mg  daily, HCTZ 25mg  daily, and metoprolol XL 100mg  daily. Tolerating these medications well.    Urge incontinence: Has previously discussed starting Myrbetriq with Dr. Randolm IdolFletke. Says that the medication was very expensive and that it wasn't worth paying that much for it. She does not have problems with leakage but finds that she has to get to a bathroom quickly when she has the urge to urinate. She wears incontinent supplies when she is out for an extended period of time and has established a habit of identifying where the restroom is when she goes somewhere she hasn't been before. Overall, she feels like her urgency and frequency of urination are about the same as 3 months ago.  ROS: See HPI.  PMFSH: Hypertension, GERD, thyroid nodule, urge incontinence  PHYSICAL EXAM: BP 118/80 (BP Location: Left Arm, Patient Position: Sitting, Cuff Size: Normal)   Pulse (!) 58   Temp 98.6 F (37 C) (Oral)   Ht 5\' 4"  (1.626 m)   Wt 187 lb 3.2 oz (84.9 kg)   LMP 04/02/1996   SpO2 97%   BMI 32.13 kg/m  Gen: Well-appearing, sitting comfortably in chair HEENT: normocephalic, atraumatic, moist mucous membranes  Heart: RRR, no M/R/G Lungs: CTAB, normal work of breathing Neuro: alert, grossly nonfocal, speech normal Ext: No appreciable lower extremity edema bilaterally   ASSESSMENT/PLAN:  Health maintenance:  -Has Rx for TdaP to bring to pharmacy -Follow up on this at next visit to make sure TdaP received  URGE INCONTINENCE Stable since last visit on  04/19/17. Patient is not interested in trying Myrbetriq at this time due to its high cost and ability to manage the symptoms of her incontinence by planning ahead and using incontinence supplies as needed.   HYPERTENSION, BENIGN SYSTEMIC Well-controlled off of losartan 100 mg daily. Continue other medications as prescribed.  Check CMET & lipids today.  FOLLOW UP: Follow up in 6-9 months to discuss issues above.  Note written by medical student Gracy BruinsMichelle Ikoma.  Patient seen along with [employer] student Gracy BruinsMichelle Ikoma. I personally evaluated this patient along with the student, and verified all aspects of the history, physical exam, and medical decision making as documented by the student. I agree with the student's documentation and have made all necessary edits.   Levert FeinsteinBrittany McIntyre, MD [facility]

## 2017-07-02 NOTE — Patient Instructions (Signed)
It was great to see you again today!  Checking labs today Blood pressure looks great Stay on current medications Follow up with me in 6-8 months.  Get Tdap when you're able.  Be well, Dr. Pollie MeyerMcIntyre

## 2017-07-02 NOTE — Assessment & Plan Note (Signed)
Well-controlled off of losartan 100 mg daily. Continue other medications as prescribed.

## 2017-07-02 NOTE — Assessment & Plan Note (Addendum)
Stable since last visit on 04/19/17. Patient is not interested in trying Myrbetriq at this time due to its high cost and ability to manage the symptoms of her incontinence by planning ahead and using incontinence supplies as needed.

## 2017-07-03 LAB — CMP14+EGFR
A/G RATIO: 1.5 (ref 1.2–2.2)
ALT: 21 IU/L (ref 0–32)
AST: 28 IU/L (ref 0–40)
Albumin: 4.8 g/dL (ref 3.5–4.8)
Alkaline Phosphatase: 78 IU/L (ref 39–117)
BILIRUBIN TOTAL: 0.5 mg/dL (ref 0.0–1.2)
BUN/Creatinine Ratio: 15 (ref 12–28)
BUN: 15 mg/dL (ref 8–27)
CALCIUM: 10.5 mg/dL — AB (ref 8.7–10.3)
CO2: 27 mmol/L (ref 20–29)
Chloride: 100 mmol/L (ref 96–106)
Creatinine, Ser: 1 mg/dL (ref 0.57–1.00)
GFR, EST AFRICAN AMERICAN: 64 mL/min/{1.73_m2} (ref >59)
GFR, EST NON AFRICAN AMERICAN: 56 mL/min/{1.73_m2} — AB (ref >59)
GLOBULIN, TOTAL: 3.3 g/dL (ref 1.5–4.5)
Glucose: 93 mg/dL (ref 65–99)
Potassium: 3.7 mmol/L (ref 3.5–5.2)
Sodium: 143 mmol/L (ref 134–144)
TOTAL PROTEIN: 8.1 g/dL (ref 6.0–8.5)

## 2017-07-03 LAB — LIPID PANEL
CHOL/HDL RATIO: 3.8 ratio (ref 0.0–4.4)
Cholesterol, Total: 186 mg/dL (ref 100–199)
HDL: 49 mg/dL (ref >39)
LDL Calculated: 107 mg/dL — ABNORMAL HIGH (ref 0–99)
Triglycerides: 150 mg/dL — ABNORMAL HIGH (ref 0–149)
VLDL CHOLESTEROL CAL: 30 mg/dL (ref 5–40)

## 2017-07-11 ENCOUNTER — Other Ambulatory Visit: Payer: Self-pay | Admitting: Family Medicine

## 2017-07-19 ENCOUNTER — Encounter: Payer: Self-pay | Admitting: Family Medicine

## 2017-07-31 DIAGNOSIS — Z803 Family history of malignant neoplasm of breast: Secondary | ICD-10-CM | POA: Diagnosis not present

## 2017-07-31 DIAGNOSIS — Z1231 Encounter for screening mammogram for malignant neoplasm of breast: Secondary | ICD-10-CM | POA: Diagnosis not present

## 2017-08-06 ENCOUNTER — Telehealth: Payer: Self-pay

## 2017-08-06 NOTE — Telephone Encounter (Signed)
Patient received lab results letter sent by PCP. She was to call back to discuss. Aware PCP out of office a few days.  Call back is [phone]  Ples Specter, RN [facility] RN)

## 2017-08-20 ENCOUNTER — Encounter: Payer: Self-pay | Admitting: Family Medicine

## 2017-08-29 ENCOUNTER — Other Ambulatory Visit: Payer: Self-pay | Admitting: Family Medicine

## 2017-08-30 NOTE — Telephone Encounter (Signed)
Called patient and was able to reach her. This is her new correct number.  Reviewed labs with patient.  Calcium mildly high, recommend rechecking. Lab visit scheduled for 6/3. If remains elevated will likely stop HCTZ or at least reduce dose.  Reviewed lipids and discussed 10 year risk scoring with patient. She prefers not to start cholesterol medication at this time, which I think is very reasonable. We will just monitor her cholesterol.  Patient appreciative of the call. Latrelle DodrillBrittany J McIntyre, MD

## 2017-09-02 ENCOUNTER — Other Ambulatory Visit: Payer: Medicare Other

## 2017-09-03 LAB — BASIC METABOLIC PANEL
BUN/Creatinine Ratio: 16 (ref 12–28)
BUN: 15 mg/dL (ref 8–27)
CALCIUM: 10.3 mg/dL (ref 8.7–10.3)
CHLORIDE: 104 mmol/L (ref 96–106)
CO2: 25 mmol/L (ref 20–29)
Creatinine, Ser: 0.95 mg/dL (ref 0.57–1.00)
GFR calc Af Amer: 68 mL/min/{1.73_m2} (ref >59)
GFR calc non Af Amer: 59 mL/min/{1.73_m2} — ABNORMAL LOW (ref >59)
GLUCOSE: 91 mg/dL (ref 65–99)
Potassium: 3.7 mmol/L (ref 3.5–5.2)
Sodium: 144 mmol/L (ref 134–144)

## 2017-09-06 ENCOUNTER — Encounter: Payer: Self-pay | Admitting: Family Medicine

## 2017-09-17 ENCOUNTER — Other Ambulatory Visit: Payer: Self-pay | Admitting: Family Medicine

## 2017-09-23 DIAGNOSIS — J387 Other diseases of larynx: Secondary | ICD-10-CM | POA: Diagnosis not present

## 2017-09-23 DIAGNOSIS — R1312 Dysphagia, oropharyngeal phase: Secondary | ICD-10-CM | POA: Diagnosis not present

## 2017-10-17 ENCOUNTER — Other Ambulatory Visit: Payer: Self-pay | Admitting: Family Medicine

## 2017-11-12 ENCOUNTER — Telehealth: Payer: Self-pay | Admitting: Family Medicine

## 2017-11-12 NOTE — Telephone Encounter (Signed)
Pt will call back - Return in about 6 months (around 01/01/2018).

## 2017-11-15 DIAGNOSIS — H524 Presbyopia: Secondary | ICD-10-CM | POA: Diagnosis not present

## 2017-11-15 DIAGNOSIS — H401133 Primary open-angle glaucoma, bilateral, severe stage: Secondary | ICD-10-CM | POA: Diagnosis not present

## 2017-11-15 DIAGNOSIS — Z961 Presence of intraocular lens: Secondary | ICD-10-CM | POA: Diagnosis not present

## 2017-11-15 DIAGNOSIS — H52223 Regular astigmatism, bilateral: Secondary | ICD-10-CM | POA: Diagnosis not present

## 2017-11-15 DIAGNOSIS — H5203 Hypermetropia, bilateral: Secondary | ICD-10-CM | POA: Diagnosis not present

## 2017-12-06 DIAGNOSIS — H401133 Primary open-angle glaucoma, bilateral, severe stage: Secondary | ICD-10-CM | POA: Diagnosis not present

## 2017-12-06 DIAGNOSIS — Z961 Presence of intraocular lens: Secondary | ICD-10-CM | POA: Diagnosis not present

## 2017-12-20 ENCOUNTER — Encounter: Payer: Self-pay | Admitting: Certified Nurse Midwife

## 2017-12-20 ENCOUNTER — Ambulatory Visit (INDEPENDENT_AMBULATORY_CARE_PROVIDER_SITE_OTHER): Payer: Medicare Other | Admitting: Certified Nurse Midwife

## 2017-12-20 ENCOUNTER — Other Ambulatory Visit: Payer: Self-pay

## 2017-12-20 VITALS — BP 120/68 | HR 68 | Resp 16 | Ht 63.5 in | Wt 184.0 lb

## 2017-12-20 DIAGNOSIS — Z87448 Personal history of other diseases of urinary system: Secondary | ICD-10-CM | POA: Diagnosis not present

## 2017-12-20 DIAGNOSIS — N951 Menopausal and female climacteric states: Secondary | ICD-10-CM

## 2017-12-20 DIAGNOSIS — L900 Lichen sclerosus et atrophicus: Secondary | ICD-10-CM | POA: Diagnosis not present

## 2017-12-20 DIAGNOSIS — Z01419 Encounter for gynecological examination (general) (routine) without abnormal findings: Secondary | ICD-10-CM

## 2017-12-20 DIAGNOSIS — N898 Other specified noninflammatory disorders of vagina: Secondary | ICD-10-CM | POA: Diagnosis not present

## 2017-12-20 DIAGNOSIS — Z01411 Encounter for gynecological examination (general) (routine) with abnormal findings: Secondary | ICD-10-CM

## 2017-12-20 MED ORDER — CLOBETASOL PROPIONATE 0.05 % EX OINT: TOPICAL_OINTMENT | CUTANEOUS | 0 refills | Status: DC

## 2017-12-20 NOTE — Progress Notes (Signed)
72 y.o. G1P1 Married  African American Fe here for annual exam. Denies vaginal bleeding. Continues to have vaginal itching external and  now limiting Depends,due to irritation with use. Occasional urine incontinence. Some anal soreness,  No blood in stool or tarrry stool. Just feels different. Sees Dr. Pollie Meyer for aex/labs/hypertension,GERD, cholesterol management, labs and aex. Sees PCP every 6 months for medication management. Continues to uses cane and does not want to have knee surgery. Occasional balance issue, but no falls. Eating well and has good family support. No other health concerns today.  [name] last menstrual period was 04/02/1996.          Sexually active: No.  The current method of family planning is post menopausal status & BTL    Exercising: No.  exercise Smoker:  no  Review of Systems  Constitutional: Negative.   HENT: Positive for hearing loss.   Eyes: Negative.   Respiratory: Negative.   Cardiovascular: Negative.   Gastrointestinal: Negative.   Genitourinary:       Vaginal itching,  Musculoskeletal:       Muscle/joint pain  Skin: Negative.   Neurological: Negative.   Endo/Heme/Allergies: Negative.   Psychiatric/Behavioral: Negative.     Health Maintenance: Pap:  12-13-15 neg History of Abnormal Pap: no MMG:  07-31-17 category b density birads 2:neg Self Breast exams: yes Colonoscopy: 2016 neg BMD:   2011, unsure if update TDaP:  2019 Shingles: 2014 Pneumonia: 2015 Hep C and HIV: hep c neg 2016 Labs: if needed   reports that she quit smoking about 23 years ago. Her smoking use included cigarettes. She has a 7.50 pack-year smoking history. She has never used smokeless tobacco. She reports that she does not drink alcohol or use drugs.  Past Medical History:  Diagnosis Date  . Allergic rhinitis 07/19/2014  . Arthritis of knee, right 03/11/2006  . Bursitis of shoulder, right 09/19/1999  . Cervical radiculopathy at C6 05/24/2011   Beginning Nov 2012.  Multi-level foraminal narrowing in C-spine plain films   . Diverticulosis   . DIVERTICULOSIS OF COLON 05/30/2006   Colonoscopy 06/2014 - multiple diverticula, no other abnormalities, no further colonoscopies recommended given patient's age of 64    . GERD (gastroesophageal reflux disease) 07/19/2014   Diagnosed clinically by [facility] in January of 2016.    . Glaucoma   . Hearing decreased 08/07/2011   With whooshing tinnitus Mild bilateral loss on 08/2012 audiology who will recheck her hearing in one year    . History of herniated intervertebral disc   . Hypertension   . HYPERTENSION, BENIGN SYSTEMIC 05/30/2006       . Left sided sciatica 8/95,11/00   CT confirmed   . LOW BACK PAIN SYNDROME 05/10/2009   Qualifier: Diagnosis of  By: Lorenda Hatchet CMA,, [place]  Mild C 3-4, 4-5 lumbar spinal stenosis MRI 08/2011, Diffuse lower thoracic and lumbar DDD   . Mitral valve prolapse    per patient heart murmur  . Orthostatic hypotension 06/12/2013  . Osteoarthritis   . Osteoarthritis of right knee 12/26/2012   Confirmed on xray 07/2014   . OSTEOARTHRITIS, MULTI SITES 05/30/2006   She is stoic about pain and wants to avoid surgeries Past xrays have documented DJD in right knee and lumbar spine    . Overweight (BMI 25.0-29.9) 05/30/2006  . Pitting edema 07/19/2014   Bilateral 1+ pitting edema of calves first noted on 07/19/2014.    . Sciatica 05/30/2006   Qualifier: Diagnosis of  By: Sheffield Slider MD, Deniece Portela  Involved left leg in 02/1999   . Swallowing difficulty 07/06/2013  . Urge incontinence 06/01/2008   Qualifier: Diagnosis of  By: Sheffield SliderHale MD, Deniece PortelaWayne      Past Surgical History:  Procedure Laterality Date  . CATARACT EXTRACTION  02/2011   left eye  . TUBAL LIGATION  1973  . VULVA /PERINEUM BIOPSY  2010   for hypopigmentation, non-specific result    Current Outpatient Medications  Medication Sig Dispense Refill  . acetaminophen (TYLENOL) 650 MG CR tablet Take 2 tablets (1,300 mg total) by mouth every 8 (eight)  hours as needed. (Patient taking differently: Take 1,300 mg by mouth daily. ) 90 tablet 1  . amLODipine (NORVASC) 5 MG tablet Take 1 tablet (5 mg total) by mouth daily. 90 tablet 3  . aspirin EC 81 MG tablet Take 81 mg by mouth daily.    . brimonidine-timolol (COMBIGAN) 0.2-0.5 % ophthalmic solution Place 2 drops into both eyes every 12 (twelve) hours.      . Cholecalciferol (VITAMIN D3) 1000 UNIT capsule Take 1,000 Units by mouth daily.      [name]. gabapentin (NEURONTIN) 400 MG capsule TAKE 2 CAPSULES BY MOUTH THREE TIMES A DAY 540 capsule 1  . hydrochlorothiazide (HYDRODIURIL) 25 MG tablet TAKE 1 TABLET BY MOUTH EVERY DAY 90 tablet 1  . latanoprost (XALATAN) 0.005 % ophthalmic solution   0  . metoprolol succinate (TOPROL-XL) 100 MG 24 hr tablet TAKE 1 TABLET BY MOUTH DAILY. TAKE WITH OR IMMEDIATELY FOLLOWING A MEAL 90 tablet 3  . Multiple Vitamin (MULTIVITAMINS PO) Take 1 tablet by mouth daily.      . Omega-3 Fatty Acids (FISH OIL) 1000 MG CAPS Take 2 capsules by mouth daily.      . pantoprazole (PROTONIX) 40 MG tablet Take 40 mg by mouth daily.      No current facility-administered medications for this visit.     Family History  Problem Relation Age of Onset  . Breast cancer Sister        [id number]  . Breast cancer Sister 666  . Hypertension Father   . Heart disease Father   . Stroke Mother [id number]       cerebral hemorrhage  . Hypertension Mother     ROS:  Pertinent items are noted in HPI.  Otherwise, a comprehensive ROS was negative.  Exam:   BP 120/68   Pulse 68   Resp 16   Ht 5' 3.5" (1.613 m)   Wt 184 lb (83.5 kg)   LMP 04/02/1996   BMI 32.08 kg/m  Height: 5' 3.5" (161.3 cm) Ht Readings from Last 3 Encounters:  12/20/17 5' 3.5" (1.613 m)  07/02/17 5\' 4"  (1.626 m)  04/19/17 5\' 4"  (1.626 m)    General appearance: alert, cooperative and appears stated age Head: Normocephalic, without obvious abnormality, atraumatic Neck: no adenopathy, supple, symmetrical, trachea midline and  thyroid normal to inspection and palpation Lungs: clear to auscultation bilaterally Breasts: normal appearance, no masses or tenderness, No nipple retraction or dimpling, No nipple discharge or bleeding, No axillary or supraclavicular adenopathy Heart: regular rate and rhythm Abdomen: soft, non-tender; no masses,  no organomegaly Extremities: extremities normal, atraumatic, no cyanosis or edema Skin: Skin color, texture, turgor normal. No rashes or lesions Lymph nodes: Cervical, supraclavicular, and axillary nodes normal. No abnormal inguinal nodes palpated Neurologic: Grossly normal   Pelvic: External genitalia:  Normal female with decrease pigmentation in vulva area with LS lace like appearance, with ? Skin change on mid area of  right vulva, Shown to patient with mirror. Vaseline residue noted on skin.              Urethra:  normal appearing urethra with no masses, tenderness or lesions              Bartholin's and Skene's: normal                 Vagina: normal appearing vagina with normal color and discharge, no lesions              Cervix: no cervical motion tenderness, no lesions and normal appearance              Pap taken: No. Bimanual Exam:  Uterus:  normal size, contour, position, consistency, mobility, non-tender and anteverted              Adnexa: normal adnexa and no mass, fullness, tenderness               Rectovaginal: Confirms               Anus:  normal sphincter tone, no lesions  Anal area decrease pigmentation noted consistent with LS changes. Small pea size area of skin change above rectal opening, not bleeding fleshy color, non tender ( area of itching per patient). Area shown to patient in mirror.  Chaperone present: yes  A:  Well Woman with normal exam  Post menopausal  Lichen Sclerosus in vulva/perineal/anal area with decrease skin pigmentation  R/O vaginal infection  Balance/ambulation with cane assistance now  Hypertension, Cholesterol, GERD, Vitamin D  management with PCP    P:   Reviewed health and wellness pertinent to exam  Aware of need to advise if vaginal bleeding  Discussed Lichen Sclerosus finding in vulva/perineal/anal area and etiology. Discussed treatment with Clobetasol ointment which should decease itching and skin change. Patient agreeable.  Rx Clobetasol ointment see order with instructions for twice daily use  Lab: Affirm  Discussed with patient the two areas of concern and may need biopsy of tissue. Recommend recheck in 2 weeks with Dr.Miller( she has seen before) and if needed biopsy of area. Questions addressed. Patient agreeable. Will wait for affirm results and schedule for patient.  Pap smear: no   counseled on breast self exam, mammography screening, feminine hygiene, adequate intake of calcium and vitamin D, diet and exercise, Kegel's exercises  return annually or prn  An After Visit Summary was printed and given to the patient.

## 2017-12-20 NOTE — Patient Instructions (Signed)

## 2017-12-21 LAB — VAGINITIS/VAGINOSIS, DNA PROBE
CANDIDA SPECIES: NEGATIVE
Gardnerella vaginalis: NEGATIVE
Trichomonas vaginosis: NEGATIVE

## 2017-12-23 ENCOUNTER — Telehealth: Payer: Self-pay | Admitting: Certified Nurse Midwife

## 2017-12-23 NOTE — Telephone Encounter (Signed)
Spoke with patient. Patient calling to request PA for clobetasol ointment.   Per review of Epic, PA submitted today via [email or web address]. Advised patient can take up to 5 business days for response. Once response received our office will notify you of response. OV canceled for 2wk recheck on 10/4. Patient will reschedule OV when she starts medication. Patient verbalizes understanding.  Routing to YahooShannon Levens, CMA  Encounter closed  Cc: Leota Sauerseborah Leonard, CNM

## 2017-12-23 NOTE — Telephone Encounter (Signed)
Patient is asking to talk with Verner Choleborah Leonard's nurse regarding the medication she was prescribed at her last visit.

## 2017-12-30 ENCOUNTER — Other Ambulatory Visit: Payer: Self-pay | Admitting: Internal Medicine

## 2018-01-02 ENCOUNTER — Telehealth: Payer: Self-pay | Admitting: Certified Nurse Midwife

## 2018-01-02 NOTE — Telephone Encounter (Signed)
Laura Griffin -per review of Epic, PA submitted on 9/23 for clobetasol through [email or web address], have you received a response?  Cc: Leota Sauers, CNM

## 2018-01-02 NOTE — Telephone Encounter (Signed)
Patient states insurance is waiting on approval from [place] for prescription. OK to leave detailed message if she needs to be contacted.

## 2018-01-03 ENCOUNTER — Ambulatory Visit: Payer: Medicare Other | Admitting: Certified Nurse Midwife

## 2018-01-03 NOTE — Telephone Encounter (Signed)
Reference number for prior authorization for clobetasol is [id number].

## 2018-01-03 NOTE — Telephone Encounter (Signed)
So has this been released for refill

## 2018-01-03 NOTE — Telephone Encounter (Signed)
Clobetasol ointment has been approved.  Pharmacy and patient aware.

## 2018-01-03 NOTE — Telephone Encounter (Signed)
After reviewing covermymeds the prior auth was unable to be processed.  Called pharmacy to get more information Laura Griffin is saying the clobetasol is nonformulary.  Can call [phone] to process prior auth.

## 2018-01-03 NOTE — Telephone Encounter (Signed)
Initiated prior authorization for clobetasol over the phone with Rosann Auerbach as an urgent request.  Waiting decision.

## 2018-01-10 ENCOUNTER — Other Ambulatory Visit: Payer: Self-pay | Admitting: Family Medicine

## 2018-01-14 NOTE — Telephone Encounter (Signed)
2nd request

## 2018-01-17 ENCOUNTER — Other Ambulatory Visit: Payer: Self-pay

## 2018-01-17 ENCOUNTER — Ambulatory Visit (INDEPENDENT_AMBULATORY_CARE_PROVIDER_SITE_OTHER): Payer: Medicare Other | Admitting: Family Medicine

## 2018-01-17 ENCOUNTER — Encounter: Payer: Self-pay | Admitting: Family Medicine

## 2018-01-17 VITALS — BP 142/88 | HR 52 | Temp 97.5°F | Wt 185.0 lb

## 2018-01-17 DIAGNOSIS — Z23 Encounter for immunization: Secondary | ICD-10-CM

## 2018-01-17 DIAGNOSIS — R51 Headache: Secondary | ICD-10-CM | POA: Diagnosis not present

## 2018-01-17 DIAGNOSIS — R519 Headache, unspecified: Secondary | ICD-10-CM

## 2018-01-17 DIAGNOSIS — R2681 Unsteadiness on feet: Secondary | ICD-10-CM

## 2018-01-17 DIAGNOSIS — E782 Mixed hyperlipidemia: Secondary | ICD-10-CM | POA: Diagnosis not present

## 2018-01-17 NOTE — Patient Instructions (Signed)
It was wonderful to see you today.  Thank you for choosing [facility].   Please call [phone] with any questions about today's appointment.  Please be sure to schedule follow up at the front  desk before you leave today.   Carina Brown, MD  Family Medicine    

## 2018-01-17 NOTE — Progress Notes (Signed)
Patient Name: Laura Griffin Date of Birth: September 04, 1945 Date of Visit: 01/17/18 PCP: Latrelle Dodrill, MD  Chief Complaint: mild headache   Subjective: Laura Griffin is a pleasant 72 y.o. year old with history of hypertension, mixed urinary incontinence, osteoarthritis and chronic low back pain presenting today for routine follow-up.  She reports overall she is doing well.   Mrs. Laura Griffin reports she is taking her medications as prescribed.  She continues to watch her diet closely.  She reports her husband has not been watching his diet so closely.   Patient reports 3 to 4 weeks of an intermittent headache.  The headache is at the end of the day after dinner.  The headache lasts 20 to 30 minutes.  The headache is bitemporal and mild in nature.  It is a 2 out of 3 at worst.  She is a remote history of headaches but has not had any in many years.  She denies fevers, weight loss, back pain, headaches which wake her up at night, worsening of her urinary incontinence or bowel incontinence.  She denies numbness or weakness.  She denies falls.  She denies recent trauma.  The headaches completely resolve with Tylenol.  She is up-to-date on all of her cancer screening.  The patient reports a long-standing history of gait instability.  Reports several falls in the last year.  She has not fallen in the last 3 months.  She reports her gait has always been unsteady.  She previously did physical therapy for this which helped.  She would like to return to this.   ROS:  ROS Negative except for as above.   I have reviewed the patient's medical, surgical, family, and social history as appropriate.   Vitals:   01/17/18 1106 01/17/18 1128  BP: (!) 162/92 (!) 142/88  Pulse: (!) 52   Temp: (!) 97.5 F (36.4 C)   SpO2: 97%    Filed Weights   01/17/18 1106  Weight: 185 lb (83.9 kg)  BP on repeat 142/88  HEENT: Sclera anicteric. Dentition is moderate. Appears well hydrated. Neck:  Supple Cardiac: Regular rate and rhythm. Normal S1/S2. No murmurs, rubs, or gallops appreciated. Lungs: Clear bilaterally to ascultation.  Abdomen: Normoactive bowel sounds. No tenderness to deep or light palpation. No rebound or guarding.  Extremities: Warm, well perfused without edema.  Skin: Warm, dry Psych: Pleasant and appropriate  Neuro: Cranial nerves II through XII sitting intact.  Symmetric finger-to-nose normal gait.  Negative Romberg   Laura Griffin was seen today for follow-up.  Diagnoses and all orders for this visit:  Mixed hyperlipidemia -     Lipid Panel  Gait instability -     Ambulatory referral to Physical Therapy  Intermittent headache, differential is broad, this is new onset in an elderly patient. No red flag features but certainly new headache at this agent warrants additional evaluation.  Differential includes tension type headaches, change in vision causing headaches given patient's history of long-standing glaucoma, less likely malignancy.  We discussed the need for imaging given that her age and the new onset of her headaches.  She would like to wait another week to see if this resolves itself.  will send myself a reminder to call her in 1 week's time.  Viewed return precautions including worsening headaches nausea, vomiting, weakness or numbness.  Her last head imaging was in 2002.  Encounter for immunization -     Flu vaccine HIGH DOSE PF  Terisa Starr, MD  Family Medicine  Teaching Service

## 2018-01-18 LAB — LIPID PANEL
CHOLESTEROL TOTAL: 178 mg/dL (ref 100–199)
Chol/HDL Ratio: 3 ratio (ref 0.0–4.4)
HDL: 59 mg/dL (ref >39)
LDL CALC: 101 mg/dL — AB (ref 0–99)
TRIGLYCERIDES: 92 mg/dL (ref 0–149)
VLDL Cholesterol Cal: 18 mg/dL (ref 5–40)

## 2018-01-20 ENCOUNTER — Telehealth: Payer: Self-pay | Admitting: Family Medicine

## 2018-01-20 DIAGNOSIS — E785 Hyperlipidemia, unspecified: Secondary | ICD-10-CM

## 2018-01-20 NOTE — Telephone Encounter (Signed)
Left voicemail on patient's home machine with generic voicemail indicating she should return our call.  Nursing if patient calls back please let her know the following information:  Her cholesterol panel is improved from last year.  However her overall risk of having a heart attack or stroke is above the threshold at which would recommend a statin.  This is a medication which improves cholesterol and reduces the risk of having a heart attack or stroke.  I would like to discuss this with her further.  Please ask the patient what time is best to call her during the day or evening.

## 2018-01-21 ENCOUNTER — Ambulatory Visit (INDEPENDENT_AMBULATORY_CARE_PROVIDER_SITE_OTHER): Payer: Medicare Other | Admitting: Certified Nurse Midwife

## 2018-01-21 ENCOUNTER — Other Ambulatory Visit: Payer: Self-pay

## 2018-01-21 ENCOUNTER — Encounter: Payer: Self-pay | Admitting: Certified Nurse Midwife

## 2018-01-21 VITALS — BP 118/64 | HR 68 | Resp 16 | Wt 183.0 lb

## 2018-01-21 DIAGNOSIS — L900 Lichen sclerosus et atrophicus: Secondary | ICD-10-CM | POA: Diagnosis not present

## 2018-01-21 NOTE — Patient Instructions (Signed)
Lichen Sclerosus Lichen sclerosus is a skin problem. It can happen on any part of the body. It happens most often in the anal or genital areas. It can cause itching and discomfort. Treatment can help to control symptoms. This skin problem is not passed from one person to another (not contagious). The cause is not known. Follow these instructions at home:  Take over-the-counter and prescription medicines only as told by your doctor.  Use creams or ointments as told by your doctor.  Do not scratch the affected areas of skin.  Women should keep the vagina as clean and dry as they can.  Keep all follow-up visits as told by your doctor. This is important. Contact a doctor if:  Your redness, swelling, or pain gets worse.  You have fluid, blood, or pus coming from the area.  You have new patches (lesions) on your skin.  You have a fever.  You have pain during sex. This information is not intended to replace advice given to you by your health care provider. Make sure you discuss any questions you have with your health care provider. Document Released: 03/01/2008 Document Revised: 08/25/2015 Document Reviewed: 06/14/2014 Elsevier Interactive Patient Education  2018 Elsevier Inc.  

## 2018-01-21 NOTE — Progress Notes (Signed)
  Subjective:     Patient ID: Laura Griffin, female   DOB: [birth year], 72 y.o.   MRN: [id number]  72 yo african [place] female here to recheck area of vulva with LS. She is using Clobetasol twice daily as discussed. Itching and burning have resolved with use. No bleeding from area, feels better. Patient feels that the area is healing, not as tend Also no feeling area of prominent skin in rectal area now. No other health issues today.    Review of Systems  Constitutional: Negative.   HENT: Negative.   Eyes: Negative.   Respiratory: Negative.   Cardiovascular: Negative.   Endocrine: Negative.   Genitourinary: Negative.  Negative for dysuria, genital sores and vaginal bleeding. Difficulty urinating: external vaginal skin feels better.  Neurological: Negative.   Hematological: Negative.   Psychiatric/Behavioral: Negative.        Objective:   Physical Exam  Constitutional: She is oriented to person, place, and time. She appears well-developed and well-nourished.  Abdominal: Soft. There is no tenderness.  Genitourinary:    Pelvic exam was performed with patient supine. There is no rash, tenderness or lesion on the right labia. There is no rash, tenderness or lesion on the left labia. No vaginal discharge found.  Lymphadenopathy: No inguinal adenopathy noted on the right or left side.  Neurological: She is alert and oriented to person, place, and time.  Skin: Skin is warm and dry.  Psychiatric: Her behavior is normal. Judgment and thought content normal.       Assessment:     Lichen sclerosis areas per diagram improving with Clobetasol ointment use. Areas of concern in rectal area and fourchette have resolved    Plan:     Discussed improvement noted of Lichen and need for continued use of Clobetasol bid x 2 more weeks and recheck. Warning signs with use given and need to advise if vaginal bleeding. Questions addressed.  RV 2 weeks,prn

## 2018-01-23 MED ORDER — ROSUVASTATIN CALCIUM 5 MG PO TABS: 5.0000 mg | ORAL_TABLET | Freq: Every day | ORAL | 3 refills | Status: DC

## 2018-01-23 NOTE — Telephone Encounter (Signed)
Patient returned call. Information given. She can be reached at [phone] today until 6 pm.  Ples Specter, RN [facility] RN)

## 2018-01-23 NOTE — Addendum Note (Signed)
Addended by: Manson Passey, Angi Goodell on: 01/23/2018 03:23 PM   Modules accepted: Orders

## 2018-01-23 NOTE — Telephone Encounter (Signed)
Called patient regarding recommendation for statin therapy.  Reviewed the most recent guidelines with the patient who is very educated.  Reviewed the potential benefits and possible side effects of the medication.  Her family history is significant for stroke and coronary artery disease in her father in his 41s.  Her mother had an intracranial hemorrhage in her 30s.  We reviewed that after age 74 there is minimal data supporting continued use.  After a lengthy discussion she elected to try the medication for several months to see if she tolerated it.  Prescription sent to pharmacy

## 2018-01-24 ENCOUNTER — Telehealth: Payer: Self-pay | Admitting: Family Medicine

## 2018-01-24 NOTE — Telephone Encounter (Signed)
Called patient at 5 PM today to follow up from visit last week. She has had no recurrence of headaches (had a few days of mild headaches when seen last week). She reports she feels well. She will call if these recur. Reviewed return precautions at length.

## 2018-01-27 ENCOUNTER — Other Ambulatory Visit: Payer: Self-pay | Admitting: Certified Nurse Midwife

## 2018-01-27 DIAGNOSIS — L900 Lichen sclerosus et atrophicus: Secondary | ICD-10-CM

## 2018-01-28 NOTE — Telephone Encounter (Signed)
Medication refill request: Temovate Last AEX:  12/20/17 Next AEX: 12/25/18 Last MMG (if hormonal medication request): 07/31/17 Bi-rads 2 Benign  Refill authorized: 60g with 0RF

## 2018-02-04 ENCOUNTER — Ambulatory Visit (INDEPENDENT_AMBULATORY_CARE_PROVIDER_SITE_OTHER): Payer: Medicare Other | Admitting: Certified Nurse Midwife

## 2018-02-04 ENCOUNTER — Encounter: Payer: Self-pay | Admitting: Certified Nurse Midwife

## 2018-02-04 ENCOUNTER — Other Ambulatory Visit: Payer: Self-pay

## 2018-02-04 VITALS — BP 116/68 | HR 64 | Resp 16 | Wt 184.0 lb

## 2018-02-04 DIAGNOSIS — L900 Lichen sclerosus et atrophicus: Secondary | ICD-10-CM

## 2018-02-04 NOTE — Progress Notes (Signed)
Review of Systems  Constitutional: Negative.   HENT: Negative.   Eyes: Negative.   Respiratory: Negative.   Cardiovascular: Negative.   Gastrointestinal: Negative.   Genitourinary: Positive for frequency. Negative for dysuria and urgency.       Vaginal itching better  Musculoskeletal: Negative.   Skin: Negative.   Neurological: Negative.   Endo/Heme/Allergies: Negative.   Psychiatric/Behavioral: Negative.     72 y.o. Married [place] American female G1P1 here for follow up of Lichen Sclerosis treated with Clobetasol initiated on 12/20/17. Patient has been using medication twice daily for two weeks as instructed at last visit. Feels the itching has definitely improved and feel less irritated. Denies vaginal bleeding or vaginal changes. Not sexually active at present. No urinary issues. No other health issues today.   Physical Exam  Constitutional: She is oriented to person, place, and time. She appears well-developed and well-nourished.  Abdominal: Soft.  Genitourinary: Rectum normal and vagina normal.    Pelvic exam was performed with patient supine. There is no rash, tenderness or lesion on the right labia. There is no rash, tenderness or lesion on the left labia.  Genitourinary Comments: Vaginal moisture noted, no odor or excessive discharge , vaginal exam with speculum only, no internal palpation  Lymphadenopathy: No inguinal adenopathy noted on the right or left side.  Neurological: She is alert and oriented to person, place, and time.  Skin: Skin is warm and dry.  Psychiatric: She has a normal mood and affect. Her behavior is normal. Judgment and thought content normal.     A:Lichen Sclerosis responding well to Clobetasol ointment No new areas noted of Lichen     P: Discussed findings of external pelvic exam and Lichen responding well to treatment. Instructed to use once daily now for the next month and recheck in one month. Notify if patient starts with itching again or  other issues. Rx Clobetasol updated see order with instructions.  Rv prn, 4 weeks

## 2018-02-04 NOTE — Patient Instructions (Signed)
Use clobetasol ointment once daily in am( for 2 weeks)  and coconut oil at bedtime daily

## 2018-03-04 ENCOUNTER — Other Ambulatory Visit: Payer: Self-pay

## 2018-03-04 ENCOUNTER — Encounter: Payer: Self-pay | Admitting: Certified Nurse Midwife

## 2018-03-04 ENCOUNTER — Ambulatory Visit (INDEPENDENT_AMBULATORY_CARE_PROVIDER_SITE_OTHER): Payer: Medicare Other | Admitting: Certified Nurse Midwife

## 2018-03-04 VITALS — BP 114/68 | HR 60 | Resp 16 | Wt 186.0 lb

## 2018-03-04 DIAGNOSIS — N904 Leukoplakia of vulva: Secondary | ICD-10-CM | POA: Diagnosis not present

## 2018-03-04 NOTE — Progress Notes (Signed)
  Subjective:     Patient ID: Laura Griffin, female   DOB: [birth year], 72 y.o.   MRN: [id number]  72 yo african [place] female here for follow up of Lichen Sclerosis follow up in genital area, mainly clitoral area. Patient using Clobetasol once daily now for past month. All itching has stopped per patient. She had rash on lower back and arms from unknown source, but has resolved. Denies any vaginal bleeding or concerns.    Review of Systems  Constitutional: Negative.   HENT: Negative.   Eyes: Negative.   Respiratory: Negative.   Cardiovascular: Negative.   Endocrine: Negative.   Genitourinary: Negative.        Lichen Sclerosis is better, no itching   Skin: Positive for rash.       On lower back and arms now resolved  Allergic/Immunologic: Negative.   Neurological: Negative.   Hematological: Negative.   Psychiatric/Behavioral: Negative.        Objective:   Physical Exam  Constitutional: She is oriented to person, place, and time. She appears well-developed and well-nourished.  Genitourinary:    Pelvic exam was performed with patient supine. There is no rash, tenderness or lesion on the right labia. There is no rash, tenderness or lesion on the left labia.  Lymphadenopathy: No inguinal adenopathy noted on the right or left side.       Right: No inguinal adenopathy present.       Left: No inguinal adenopathy present.  Neurological: She is alert and oriented to person, place, and time.  Skin: Skin is warm and dry.     Psychiatric: She has a normal mood and affect. Her speech is normal and behavior is normal. Judgment and thought content normal. Cognition and memory are normal.       Assessment:     Chronic history of Lichen sclerosis of vulva and surrounding area. No active areas today. Responded well to Clobetasol use. Previous skin rash? Etiology resolved.    Plan:     Discussed finding and shown to patient in mirror. Instructed if itching begins again to look  with mirror if notes same appearance start with Clobetasol ointment again twice daily x 4 weeks, come in for appointment to have checked. Patient agreeable and has Rx. Questions addressed.  Rv prn, aex

## 2018-03-14 ENCOUNTER — Encounter: Payer: Self-pay | Admitting: Family Medicine

## 2018-03-14 ENCOUNTER — Ambulatory Visit (INDEPENDENT_AMBULATORY_CARE_PROVIDER_SITE_OTHER): Payer: Medicare Other | Admitting: Family Medicine

## 2018-03-14 ENCOUNTER — Other Ambulatory Visit: Payer: Self-pay

## 2018-03-14 DIAGNOSIS — K219 Gastro-esophageal reflux disease without esophagitis: Secondary | ICD-10-CM | POA: Diagnosis not present

## 2018-03-14 DIAGNOSIS — L439 Lichen planus, unspecified: Secondary | ICD-10-CM

## 2018-03-14 DIAGNOSIS — E785 Hyperlipidemia, unspecified: Secondary | ICD-10-CM | POA: Diagnosis not present

## 2018-03-14 DIAGNOSIS — I10 Essential (primary) hypertension: Secondary | ICD-10-CM

## 2018-03-14 NOTE — Patient Instructions (Signed)
Stop the crestor Follow up with me in 6 months  Be well, Dr. Pollie MeyerMcIntyre

## 2018-03-14 NOTE — Progress Notes (Signed)
Date of Visit: 03/14/2018   HPI:  Patient presents for routine follow up.  Hypertension - currently taking amlodipine 5mg  daily, HCTZ 25mg  daily, and metoprolol succinate 100mg  daily. Tolerating these medications well. No chest pain, shortness of breath, or swelling.  GERD - taking protonix 40mg  daily. Tolerating well, symptoms well controlled on this medication.  Lichen planus - has seen GYN for lichen planus, resolved at last visit with GYN. Has clobetasol she uses if needed for it.  Hyperlipidemia - was started on crestor 5mg  recently. Has been getting lots of aching in her legs and cramping in calves since starting the medication.   ROS: See HPI.  PMFSH: history of hypertension, GERD, allergic rhinitis, urge incontinence  PHYSICAL EXAM: BP (!) 142/82   Pulse (!) 54   Temp 97.9 F (36.6 C) (Oral)   Ht 5' 3.5" (1.613 m)   Wt 186 lb 6.4 oz (84.6 kg)   LMP 04/02/1996   SpO2 99%   BMI 32.50 kg/m  Gen: no acute distress, pleasant, cooperative, well appearing HEENT: normocephalic, atraumatic, mmm Heart:  Regular rate and rhythm, no murmur Lungs: clear to auscultation bilaterally, normal work of breathing  Neuro: alert, speech normal, grossly nonfocal Ext: No appreciable lower extremity edema bilaterally. No calf tenderness, swelling, warmth, or erythema.   ASSESSMENT/PLAN:  Health maintenance:  -UTD on HM items  HYPERTENSION, BENIGN SYSTEMIC Well controlled. Continue current regimen.   GERD (gastroesophageal reflux disease) Well controlled. Continue current regimen.   Hyperlipidemia Has not tolerated crestor. Will stop it at this time as side effects outweigh benefits.  Lichen planus Following up with GYN as needed for this  FOLLOW UP: Follow up in 6 mos for routine medical issues  [place]adaBrittany J. Pollie MeyerMcIntyre, MD [facility]

## 2018-03-17 ENCOUNTER — Other Ambulatory Visit: Payer: Self-pay | Admitting: Family Medicine

## 2018-03-17 DIAGNOSIS — E785 Hyperlipidemia, unspecified: Secondary | ICD-10-CM | POA: Insufficient documentation

## 2018-03-17 DIAGNOSIS — L439 Lichen planus, unspecified: Secondary | ICD-10-CM | POA: Insufficient documentation

## 2018-03-17 NOTE — Assessment & Plan Note (Signed)
Well-controlled.  Continue current regimen. 

## 2018-03-17 NOTE — Assessment & Plan Note (Signed)
Has not tolerated crestor. Will stop it at this time as side effects outweigh benefits.

## 2018-03-17 NOTE — Assessment & Plan Note (Signed)
Following up with GYN as needed for this

## 2018-03-19 NOTE — Telephone Encounter (Signed)
2nd request. Rosa Gambale Dawn, CMA  

## 2018-03-22 ENCOUNTER — Other Ambulatory Visit: Payer: Self-pay | Admitting: Family Medicine

## 2018-04-14 ENCOUNTER — Other Ambulatory Visit: Payer: Self-pay | Admitting: Family Medicine

## 2018-05-28 ENCOUNTER — Ambulatory Visit (INDEPENDENT_AMBULATORY_CARE_PROVIDER_SITE_OTHER): Payer: Medicare Other | Admitting: Certified Nurse Midwife

## 2018-05-28 ENCOUNTER — Encounter: Payer: Self-pay | Admitting: Certified Nurse Midwife

## 2018-05-28 ENCOUNTER — Other Ambulatory Visit: Payer: Self-pay

## 2018-05-28 VITALS — BP 116/68 | HR 68 | Resp 16 | Wt 188.0 lb

## 2018-05-28 DIAGNOSIS — N904 Leukoplakia of vulva: Secondary | ICD-10-CM

## 2018-05-28 DIAGNOSIS — N898 Other specified noninflammatory disorders of vagina: Secondary | ICD-10-CM

## 2018-05-28 DIAGNOSIS — N393 Stress incontinence (female) (male): Secondary | ICD-10-CM

## 2018-05-28 NOTE — Patient Instructions (Signed)
Lichen Sclerosus  Lichen sclerosus is a skin problem. It can happen on any part of the body, but it commonly involves the anal or genital areas. It can cause itching and discomfort in these areas. Treatment can help to control symptoms. When the genital area is affected, getting treatment is important because the condition can cause scarring that may lead to other problems.  What are the causes?  The cause of this condition is not known. It may be related to an overactive immune system or a lack of certain hormones. Lichen sclerosus is not an infection or a fungus, and it is not passed from one person to another (not contagious).  What increases the risk?  This condition is more likely to develop in women, usually after menopause.  What are the signs or symptoms?  Symptoms of this condition include:   Thin, wrinkled, white areas on the skin.   Thickened white areas on the skin.   Red and swollen patches (lesions) on the skin.   Tears or cracks in the skin.   Bruising.   Blood blisters.   Severe itching.   Pain, itching, or burning when urinating.  Constipation is also common in people with lichen sclerosus.  How is this diagnosed?  This condition may be diagnosed with a physical exam. In some cases, a tissue sample (biopsy sample) may be removed to be looked at under a microscope.  How is this treated?  This condition is usually treated with medicated creams or ointments (topical steroids) that are applied over the affected areas. In some cases, treatment may also include medicines that are taken by mouth. Surgery may be needed in more severe cases that are causing problems such as scarring.  Follow these instructions at home:   Take or use over-the-counter and prescription medicines only as told by your health care provider.   Use creams or ointments as told by your health care provider.   Do not scratch the affected areas of skin.   If you are a woman, be sure to keep the vaginal area as clean and dry  as possible.   Clean the affected area of skin gently with water. Avoid using rough towels or toilet paper.   Keep all follow-up visits as told by your health care provider. This is important.  Contact a health care provider if:   You have increasing redness, swelling, or pain in the affected area.   You have fluid, blood, or pus coming from the affected area.   You have new lesions on your skin.   You have a fever.   You have pain during sex.  Summary   Lichen sclerosus is a skin problem. When the genital area is affected, getting treatment is important because the condition can cause scarring that may lead to other problems.   This condition is usually treated with medicated creams or ointments (topical steroids) that are applied over the affected areas.   Take or use over-the-counter and prescription medicines only as told by your health care provider.   Contact a health care provider if you have new lesions on your skin, have pain during sex, or have increasing redness, swelling, or pain in the affected area.   Keep all follow-up visits as told by your health care provider. This is important.  This information is not intended to replace advice given to you by your health care provider. Make sure you discuss any questions you have with your health care provider.  Document Released:   08/09/2010 Document Revised: 08/01/2017 Document Reviewed: 08/01/2017  Elsevier Interactive Patient Education  2019 Elsevier Inc[employer]

## 2018-05-28 NOTE — Progress Notes (Signed)
73 y.o. Married [place] American female G1P1 here with complaint of increase incontinence with some burning when touches skin and possible prior to urinating. Wearing poise pad if needed. No odorous urine and  cloudy appearance. Takes medication for hypertension which increases urination. No change.  Vaginal symptoms of itching, burning and feels like it may be Lichen Sclerosis or just yeast. No increase discharge. Onset of symptoms for the past couple of months.. Has increased over last 2 weeks.. Denies new personal products or vaginal dryness or vaginal bleeding that she has seen. Menopausal. Less active due to problems with walking with her knees. Uses cane which helps. Has seen orthopedic for evaluation and knee replacement recommended. No other issues today.  Review of Systems  Constitutional: Negative.   HENT: Negative.   Eyes: Negative.   Respiratory: Negative.   Cardiovascular: Negative.   Gastrointestinal: Negative.   Genitourinary: Negative.   Musculoskeletal: Negative.   Skin:       Vaginal itching & burning  Neurological: Negative.   Endo/Heme/Allergies: Negative.   Psychiatric/Behavioral: Negative.     O:Healthy female WDWN Affect: normal, orientation x 3  Physical Exam Exam conducted with a chaperone present.  Cardiovascular:     Rate and Rhythm: Normal rate.  Pulmonary:     Effort: Pulmonary effort is normal.  Abdominal:     Palpations: Abdomen is soft.  Genitourinary:    General: Normal vulva.     Pubic Area: No rash.      Labia:        Right: Tenderness present.        Left: Tenderness present.      Urethra: No prolapse, urethral pain, urethral swelling or urethral lesion.     Vagina: Tenderness present. No vaginal discharge or lesions.     Cervix: Normal.     Uterus: Normal.      Adnexa: Right adnexa normal and left adnexa normal.       Comments: Vaginal dryness noted with atrophic appearance, slight pink discharge from speculum use only. Lab Affirm  taken Musculoskeletal:     Comments: Walks with cane assistance  Lymphadenopathy:     Lower Body: No right inguinal adenopathy. No left inguinal adenopathy.  Skin:    General: Skin is warm and dry.  Neurological:     Mental Status: She is alert and oriented to person, place, and time.  Psychiatric:        Attention and Perception: Attention and perception normal.        Mood and Affect: Mood and affect normal.        Speech: Speech normal.        Behavior: Behavior normal.        Thought Content: Thought content normal.        Cognition and Memory: Cognition and memory normal.        Judgment: Judgment normal.     A:Normal pelvic exam Atrophic vaginitis Lichen Sclerosis history with flare apparent R/O vaginitis  Incontinence R/O UTI   P:Discussed findings of Lichen sclerosis and need to start treating area again with Clobetasol twice daily for two weeks and come in to evaluate. Discussed the moisture with incontinence has increased the irritation. Discussed will also screen for vaginal infection which can increase irritation. Lab: affirm Discussed atrophic vaginitis which she is aware of. Prefers no treatment unless needed.Discussed screening for UTI due to increase in incontinence and no other changes noted. Lab: Urine culture.   Warning signs of UTI given and need  to advise if vaginal bleeding.Questions addressed.  Rv prn

## 2018-05-29 LAB — VAGINITIS/VAGINOSIS, DNA PROBE
Candida Species: NEGATIVE
Gardnerella vaginalis: NEGATIVE
Trichomonas vaginosis: NEGATIVE

## 2018-05-29 LAB — URINE CULTURE: ORGANISM ID, BACTERIA: NO GROWTH

## 2018-05-30 DIAGNOSIS — H52223 Regular astigmatism, bilateral: Secondary | ICD-10-CM | POA: Diagnosis not present

## 2018-05-30 DIAGNOSIS — H524 Presbyopia: Secondary | ICD-10-CM | POA: Diagnosis not present

## 2018-05-30 DIAGNOSIS — H5203 Hypermetropia, bilateral: Secondary | ICD-10-CM | POA: Diagnosis not present

## 2018-05-30 DIAGNOSIS — Z961 Presence of intraocular lens: Secondary | ICD-10-CM | POA: Diagnosis not present

## 2018-05-30 DIAGNOSIS — H401133 Primary open-angle glaucoma, bilateral, severe stage: Secondary | ICD-10-CM | POA: Diagnosis not present

## 2018-06-03 ENCOUNTER — Ambulatory Visit (INDEPENDENT_AMBULATORY_CARE_PROVIDER_SITE_OTHER): Payer: Medicare Other | Admitting: Family Medicine

## 2018-06-03 DIAGNOSIS — M1711 Unilateral primary osteoarthritis, right knee: Secondary | ICD-10-CM

## 2018-06-03 DIAGNOSIS — N3941 Urge incontinence: Secondary | ICD-10-CM

## 2018-06-03 NOTE — Progress Notes (Signed)
Date of Visit: 06/03/2018   HPI:  Patient presents to discuss several things:  - urinary incontinence - having increased symptoms lately. Symptoms are mostly urgency and frequency. No stress incontinence. Cannot always make it to bathroom in time. Previously took [employer] but cannot afford it and her insurance will not cover it.  - R knee pain - has history of pain in R knee, never tried any medications other than tylenol in the past. Was contemplating flexogenic clinic where they do gel injections. Never had steroid injection before but is willing. Has xrays from a few years ago that showed lateral compartment arthritis. Has pain when sitting or lying, often improves some when she is more active. Pain in lateral and anterior aspect of knee.  ROS: See HPI.  PMFSH: history of GERD, hypertension, allergic rhinitis, R knee OA, hyperlipidemia   PHYSICAL EXAM: BP 130/60   Pulse (!) 55   Temp 98.1 F (36.7 C) (Oral)   Wt 187 lb 9.6 oz (85.1 kg)   LMP 04/02/1996   SpO2 98%   BMI 32.71 kg/m  Gen: no acute distress, pleasant, cooperative, well appearing HEENT: normocephalic, atraumatic  Heart: regular rate and rhythm Lungs: normal work of breathing  Neuro: alert, grossly nonfocal, speech normal Ext: R knee without effusion, warmth, or erythema. Full active ROM without crepitus.  ASSESSMENT/PLAN:  Health maintenance:  -current on HM items  Osteoarthritis of right knee Patient willing to try steroid injection but did not want this today She will schedule at her convenience  URGE INCONTINENCE Worsening symptoms lately, patient now willing to take medication for it. Gave options of empiric trial of a cheaper agent (oxybutynin), urology referral, or patient contacting her insurance to see what medications would be covered. She prefers to contact her insurance. She will let me know what is cheapest and I will send it in.  FOLLOW UP: Schedule knee injection at patient's  convenience.  [place] J. Pollie Meyer, MD [facility]

## 2018-06-03 NOTE — Assessment & Plan Note (Signed)
Worsening symptoms lately, patient now willing to take medication for it. Gave options of empiric trial of a cheaper agent (oxybutynin), urology referral, or patient contacting her insurance to see what medications would be covered. She prefers to contact her insurance. She will let me know what is cheapest and I will send it in.

## 2018-06-03 NOTE — Assessment & Plan Note (Signed)
Patient willing to try steroid injection but did not want this today She will schedule at her convenience

## 2018-06-11 ENCOUNTER — Other Ambulatory Visit: Payer: Self-pay

## 2018-06-11 ENCOUNTER — Encounter: Payer: Self-pay | Admitting: Certified Nurse Midwife

## 2018-06-11 ENCOUNTER — Ambulatory Visit (INDEPENDENT_AMBULATORY_CARE_PROVIDER_SITE_OTHER): Payer: Medicare Other | Admitting: Certified Nurse Midwife

## 2018-06-11 VITALS — BP 118/70 | HR 68 | Resp 16 | Wt 186.0 lb

## 2018-06-11 DIAGNOSIS — N952 Postmenopausal atrophic vaginitis: Secondary | ICD-10-CM | POA: Diagnosis not present

## 2018-06-11 DIAGNOSIS — L900 Lichen sclerosus et atrophicus: Secondary | ICD-10-CM | POA: Diagnosis not present

## 2018-06-11 MED ORDER — CLOBETASOL PROPIONATE 0.05 % EX OINT: TOPICAL_OINTMENT | CUTANEOUS | 0 refills | Status: DC

## 2018-06-11 NOTE — Patient Instructions (Signed)
Lichen Sclerosus  Lichen sclerosus is a skin problem. It can happen on any part of the body. It happens most often in the anal or genital areas. It can cause itching and discomfort. Treatment can help to control symptoms. It can also help prevent scarring that may lead to other problems.  What are the causes?  The cause of this condition is not known. It is not passed from one person to another (not contagious).  What increases the risk?  This condition is more likely to develop in women. It most often occurs after menopause.  What are the signs or symptoms?  Symptoms of this condition include:  Thin, wrinkled, white areas on the skin.  Thickened white areas on the skin.  Red and swollen patches (lesions) on the skin.  Tears or cracks in the skin.  Bruising.  Blood blisters.  Very bad itching.  Pain, itching, or burning when peeing (urinating).  Trouble pooping (constipation).  How is this diagnosed?  This condition may be diagnosed with a physical exam. A sample of your skin may also be removed to be looked at under a microscope (biopsy).  How is this treated?  This condition may be treated with:  Creams or ointments (topical steroids) that are put on the skin in the affected areas. This is the most common treatment.  Medicines that are taken by mouth.  Surgery. This is only needed if the condition is very bad and is causing problems such as scarring.  Follow these instructions at home:  Take or use over-the-counter and prescription medicines only as told by your doctor.  Use creams or ointments as told by your doctor.  Do not scratch the affected areas of skin.  If you are a woman, keep the vagina as clean and dry as you can.  Clean the affected area of skin gently with water. Avoid using rough towels or toilet paper.  Keep all follow-up visits as told by your doctor. This is important.  Contact a doctor if:  Your redness, swelling, or pain gets worse.  You have fluid, blood, or pus coming from the area.  You have  new red and swollen patches on your skin.  You have a fever.  You have pain during sex.  Summary  Lichen sclerosus is a skin problem. It can cause itching and discomfort.  This condition is usually treated with creams or ointments that are put on the skin in the affected areas.  Use medicines only as told by your doctor.  Do not scratch the affected areas of skin.  Keep all follow-up visits as told by your doctor. This is important.  This information is not intended to replace advice given to you by your health care provider. Make sure you discuss any questions you have with your health care provider.  Document Released: 03/01/2008 Document Revised: 08/01/2017 Document Reviewed: 08/01/2017  Elsevier Interactive Patient Education  2019 Elsevier Inc.

## 2018-06-11 NOTE — Progress Notes (Signed)
Review of Systems  Constitutional: Negative.   HENT: Negative.   Eyes: Negative.   Respiratory: Negative.   Cardiovascular: Negative.   Gastrointestinal: Negative.   Genitourinary: Negative.   Musculoskeletal: Negative.   Skin: Negative.   Neurological: Negative.   Endo/Heme/Allergies: Negative.   Psychiatric/Behavioral: Negative.     73 y.o. Married [place] American female G1P1 here for follow up of Lichen Sclerosis treated with Clobetasol initiated on 05/28/2018. Patient using this twice daily as discussed. The itching  discomfort has improved and weeping has decreased. Burning has stopped. Still having urinary frequency and urgency uses Rx from PCP with good results. Looking at other alternatives for Rx Mybetrique now due to cost. Wearing depends to help with leakage at times. Denies any symptoms of vaginal odor, sores or bleeding or itching now. No other health issues today.  O: Healthy WD,WN female Affect: normal Skin:warm and dry Abdomen:soft, non tender :Physical Exam Exam conducted with a chaperone present.  Constitutional:      Appearance: Normal appearance.  Cardiovascular:     Rate and Rhythm: Normal rate.  Pulmonary:     Effort: Pulmonary effort is normal.  Genitourinary:    Exam position: Lithotomy position.     Pubic Area: No rash.      Labia:        Right: No tenderness or lesion.        Left: No tenderness or lesion.      Urethra: No prolapse.     Vagina: No vaginal discharge, tenderness, bleeding or lesions.     Cervix: No cervical motion tenderness.     Uterus: Normal. Not tender.      Adnexa: Right adnexa normal and left adnexa normal.       Right: No mass, tenderness or fullness.         Left: No mass, tenderness or fullness.       Rectum: Normal.    Lymphadenopathy:     Lower Body: No right inguinal adenopathy. No left inguinal adenopathy.  Neurological:     Mental Status: She is alert and oriented to person, place, and time.  Psychiatric:      Mood and Affect: Mood normal.        Behavior: Behavior normal.        Thought Content: Thought content normal.        Judgment: Judgment normal.     A:Lichen Sclerosis of vulva and around rectal area improved with Clobetasol use as directed Normal pelvic exam Atrophic vaginitis Chronic urinary urgency and frequency with PCP management    P: Discussed findings of normal pelvic exam and area of Lichen improved. Continue with twice daily on right for one week and once daily on left for one week. Recheck in 3 weeks or prn if needed. Rx Clobetasol see order with instructions Discussed vaginal dryness and coconut oil use to help with this. Patient agreeable. Continue follow up with PCP as indicated.  Rv prn 3 weeks

## 2018-06-22 ENCOUNTER — Other Ambulatory Visit: Payer: Self-pay | Admitting: Family Medicine

## 2018-06-25 ENCOUNTER — Telehealth: Payer: Self-pay | Admitting: Certified Nurse Midwife

## 2018-06-25 NOTE — Telephone Encounter (Signed)
Called and left patient a message to call back to reschedule her cancelled appointment for a three month recheck with Leota Sauers, CNM on 07/02/18.

## 2018-07-02 ENCOUNTER — Ambulatory Visit: Payer: Medicare Other | Admitting: Certified Nurse Midwife

## 2018-07-30 ENCOUNTER — Telehealth: Payer: Self-pay

## 2018-07-30 NOTE — Telephone Encounter (Signed)
Called patient and she denies calling office with questions.  Patient states that she is fine and not in need of our services at this time.  Laura Griffin

## 2018-08-27 DIAGNOSIS — Z1231 Encounter for screening mammogram for malignant neoplasm of breast: Secondary | ICD-10-CM | POA: Diagnosis not present

## 2018-08-27 DIAGNOSIS — Z803 Family history of malignant neoplasm of breast: Secondary | ICD-10-CM | POA: Diagnosis not present

## 2018-09-02 ENCOUNTER — Encounter: Payer: Self-pay | Admitting: Certified Nurse Midwife

## 2018-09-09 NOTE — Progress Notes (Signed)
Review of Systems  Constitutional: Negative.   HENT: Negative.   Respiratory: Negative.   Cardiovascular: Negative.   Gastrointestinal: Negative.   Genitourinary: Negative.        Incontinence has increased slightly  Musculoskeletal: Negative.   Skin:       Itching in vaginal area began using clobetasol for 4 days and now having burning.  Neurological: Negative.   Endo/Heme/Allergies: Negative.   Psychiatric/Behavioral: Negative.     73 y.o. Married [place] American female G1P1 here with complaint of vaginal burning and irritation. Started about 2 weeks ago. Started using Clobetasol cream with relief after 2-3 days and then began having burning. Has noted increase in incontinence and wondered if this cause. Denies urinary frequency or burning or pain with urination.  Takes a tub bath which seems to also help. Denies vaginal bleeding or back pain. Spouse doing well and staying home. No other health issues today.   O: Healthy WD,WN female Affect: Normal Skin: warm and dry Abdomen:soft, nontender, normal bowel sounds Pelvic exam:EXTERNAL GENITALIA:atrophic appearing vulva with redness noted and some dryness, labia bilateral with excoriation noted, no bleeding noted, No supra pubic tenderness. Small area of Lichen Sclerosis noted on right vulva Urethral meatus: not red, not tender VAGINA: white thick discharge noted, affirm taken CERVIX: no lesions or cervical motion tenderness and normal appearance,  UTERUS: normal, non tender ADNEXA: no masses palpable and nontender Perineal area: no redness or Lichen noted  A:Normal pelvic exam History of Lichen Sclerosis R/O yeast vulvitis/vaginitis Incontinence known   P: Discussed findings of normal pelvic exam. Discussed Lichen is present, along with what appears to be yeast. Will send vaginal swab to lab to make sure before starting oral medication if needed. Patient agreeable. Discussed Aveeno Sitz bath for comfort. Shown area to patient in  mirror, to help with applying Clobetasol and topical antifungal cream Clotrimazole to obtain from pharmacy(OTC). Questions and instructions addressed and written down.  Lab: Affirm Try a different type of panties for incontinence to see if this provides relief also.  Rv prn

## 2018-09-10 ENCOUNTER — Ambulatory Visit (INDEPENDENT_AMBULATORY_CARE_PROVIDER_SITE_OTHER): Payer: Medicare Other | Admitting: Certified Nurse Midwife

## 2018-09-10 ENCOUNTER — Encounter: Payer: Self-pay | Admitting: Certified Nurse Midwife

## 2018-09-10 ENCOUNTER — Other Ambulatory Visit: Payer: Self-pay

## 2018-09-10 ENCOUNTER — Telehealth: Payer: Self-pay | Admitting: Certified Nurse Midwife

## 2018-09-10 VITALS — BP 116/78 | HR 68 | Temp 97.5°F | Resp 16 | Wt 189.0 lb

## 2018-09-10 DIAGNOSIS — L900 Lichen sclerosus et atrophicus: Secondary | ICD-10-CM | POA: Diagnosis not present

## 2018-09-10 DIAGNOSIS — N898 Other specified noninflammatory disorders of vagina: Secondary | ICD-10-CM

## 2018-09-10 NOTE — Telephone Encounter (Signed)
Left message regarding upcoming appointment has been canceled and needs to be rescheduled. °

## 2018-09-11 LAB — VAGINITIS/VAGINOSIS, DNA PROBE
Candida Species: NEGATIVE
Gardnerella vaginalis: NEGATIVE
Trichomonas vaginosis: NEGATIVE

## 2018-09-13 ENCOUNTER — Other Ambulatory Visit: Payer: Self-pay | Admitting: Certified Nurse Midwife

## 2018-09-13 DIAGNOSIS — L900 Lichen sclerosus et atrophicus: Secondary | ICD-10-CM

## 2018-09-13 MED ORDER — CLOBETASOL PROPIONATE 0.05 % EX OINT: TOPICAL_OINTMENT | CUTANEOUS | 1 refills | Status: DC

## 2018-09-16 ENCOUNTER — Ambulatory Visit: Payer: Medicare Other | Admitting: Certified Nurse Midwife

## 2018-09-23 NOTE — Progress Notes (Signed)
Review of Systems  Constitutional: Negative.   HENT: Negative.   Eyes: Negative.   Respiratory: Negative.   Gastrointestinal: Negative.   Genitourinary: Negative.   Musculoskeletal: Negative.   Skin: Negative.        Area of irritation better   Neurological: Negative.   Endo/Heme/Allergies: Negative.   Psychiatric/Behavioral: Negative.     73 y.o. Married [place] American female G1P1 here for follow up of Lichen Sclerosus treated with Clobetasol cream and yeast vulvitis treated with OTC Clotrimazole initiated on June 01/2019. [name] Completed all medication as directed. She is continuing with Clobetasol twice weekly as directed.  Denies any symptoms of itching, scaling , broken skin, vaginal bleeding or pain now. " Feels so much better".Jenetta Downer: Healthy WD,WN female Affect: normal, orientation x 3 Skin:warm and dry Inguinal lymph nodes: no enlargement or tenderness Pelvic exam:EXTERNAL GENITALIA: normal appearing vulva with no masses, tenderness or lesions, no cracked or scaling skin or new areas of pigmentation loss,on tender VAGINA: no abnormal discharge or lesions and normal appearance, no scaling or cracked skin or increased loss or pigmentation   Physical Exam Genitourinary:       A:Chronic history of Lichen Sclerosus Recent flare with good response to treatment with Clobetasol No active Lichen noted.   P: Discussed findings of very normal appearance of vagina and vulva. No active Lichen noted. Discussed use twice week sparingly if itching only. Aware of concerns with long term use.. If no itching, use her Olive oil to prevent dryness. Questions addressed. Has Rx.    Rv prn

## 2018-09-24 ENCOUNTER — Encounter: Payer: Self-pay | Admitting: Certified Nurse Midwife

## 2018-09-24 ENCOUNTER — Ambulatory Visit (INDEPENDENT_AMBULATORY_CARE_PROVIDER_SITE_OTHER): Payer: Medicare Other | Admitting: Certified Nurse Midwife

## 2018-09-24 ENCOUNTER — Other Ambulatory Visit: Payer: Self-pay

## 2018-09-24 DIAGNOSIS — N904 Leukoplakia of vulva: Secondary | ICD-10-CM | POA: Diagnosis not present

## 2018-09-25 ENCOUNTER — Encounter: Payer: Self-pay | Admitting: Certified Nurse Midwife

## 2018-09-25 DIAGNOSIS — N904 Leukoplakia of vulva: Secondary | ICD-10-CM | POA: Insufficient documentation

## 2018-10-12 ENCOUNTER — Other Ambulatory Visit: Payer: Self-pay | Admitting: Family Medicine

## 2018-10-20 ENCOUNTER — Other Ambulatory Visit: Payer: Self-pay | Admitting: Family Medicine

## 2018-11-07 DIAGNOSIS — R1312 Dysphagia, oropharyngeal phase: Secondary | ICD-10-CM | POA: Diagnosis not present

## 2018-11-07 DIAGNOSIS — J387 Other diseases of larynx: Secondary | ICD-10-CM | POA: Diagnosis not present

## 2018-11-17 DIAGNOSIS — H52223 Regular astigmatism, bilateral: Secondary | ICD-10-CM | POA: Diagnosis not present

## 2018-11-17 DIAGNOSIS — H401133 Primary open-angle glaucoma, bilateral, severe stage: Secondary | ICD-10-CM | POA: Diagnosis not present

## 2018-11-17 DIAGNOSIS — H524 Presbyopia: Secondary | ICD-10-CM | POA: Diagnosis not present

## 2018-11-17 DIAGNOSIS — Z961 Presence of intraocular lens: Secondary | ICD-10-CM | POA: Diagnosis not present

## 2018-11-17 DIAGNOSIS — H5203 Hypermetropia, bilateral: Secondary | ICD-10-CM | POA: Diagnosis not present

## 2018-12-15 ENCOUNTER — Ambulatory Visit (INDEPENDENT_AMBULATORY_CARE_PROVIDER_SITE_OTHER): Payer: Medicare Other | Admitting: Family Medicine

## 2018-12-15 ENCOUNTER — Other Ambulatory Visit: Payer: Self-pay

## 2018-12-15 ENCOUNTER — Encounter: Payer: Self-pay | Admitting: Family Medicine

## 2018-12-15 VITALS — BP 160/88 | HR 57

## 2018-12-15 DIAGNOSIS — Z23 Encounter for immunization: Secondary | ICD-10-CM | POA: Diagnosis not present

## 2018-12-15 DIAGNOSIS — E041 Nontoxic single thyroid nodule: Secondary | ICD-10-CM

## 2018-12-15 DIAGNOSIS — M67431 Ganglion, right wrist: Secondary | ICD-10-CM | POA: Diagnosis not present

## 2018-12-15 DIAGNOSIS — K219 Gastro-esophageal reflux disease without esophagitis: Secondary | ICD-10-CM | POA: Diagnosis not present

## 2018-12-15 DIAGNOSIS — I10 Essential (primary) hypertension: Secondary | ICD-10-CM | POA: Diagnosis not present

## 2018-12-15 NOTE — Patient Instructions (Signed)
Bump on your wrist is a ganglion cyst Let me know if it is worsening or causing you pain.  Flu shot today  Checking kidneys and liver today  Schedule nurse visit in 1 week to check blood pressure  Follow up with me in 6 months as long as blood pressure is better  Be well, Dr. Pollie MeyerMcIntyre      Ganglion Cyst  A ganglion cyst is a non-cancerous, fluid-filled lump that occurs near a joint or tendon. The cyst grows out of a joint or the lining of a tendon. Ganglion cysts most often develop in the hand or wrist, but they can also develop in the shoulder, elbow, hip, knee, ankle, or foot. Ganglion cysts are ball-shaped or egg-shaped. Their size can range from the size of a pea to larger than a grape. Increased activity may cause the cyst to get bigger because more fluid starts to build up. What are the causes? The exact cause of this condition is not known, but it may be related to:  Inflammation or irritation around the joint.  An injury.  Repetitive movements or overuse.  Arthritis. What increases the risk? You are more likely to develop this condition if:  You are a woman.  You are 3315-75 years old. What are the signs or symptoms? The main symptom of this condition is a lump. It most often appears on the hand or wrist. In many cases, there are no other symptoms, but a cyst can sometimes cause:  Tingling.  Pain.  Numbness.  Muscle weakness.  Weak grip.  Less range of motion in a joint. How is this diagnosed? Ganglion cysts are usually diagnosed based on a physical exam. Your health care provider will feel the lump and may shine a light next to it. If it is a ganglion cyst, the light will likely shine through it. Your health care provider may order an X-ray, ultrasound, or MRI to rule out other conditions. How is this treated? Ganglion cysts often go away on their own without treatment. If you have pain or other symptoms, treatment may be needed. Treatment is also needed  if the ganglion cyst limits your movement or if it gets infected. Treatment may include:  Wearing a brace or splint on your wrist or finger.  Taking anti-inflammatory medicine.  Having fluid drained from the lump with a needle (aspiration).  Getting a steroid injected into the joint.  Having surgery to remove the ganglion cyst.  Placing a pad on your shoe or wearing shoes that will not rub against the cyst if it is on your foot. Follow these instructions at home:  Do not press on the ganglion cyst, poke it with a needle, or hit it.  Take over-the-counter and prescription medicines only as told by your health care provider.  If you have a brace or splint: ? Wear it as told by your health care provider. ? Remove it as told by your health care provider. Ask if you need to remove it when you take a shower or a bath.  Watch your ganglion cyst for any changes.  Keep all follow-up visits as told by your health care provider. This is important. Contact a health care provider if:  Your ganglion cyst becomes larger or more painful.  You have pus coming from the lump.  You have weakness or numbness in the affected area.  You have a fever or chills. Get help right away if:  You have a fever and have any of these  in the cyst area: ? Increased redness. ? Red streaks. ? Swelling. Summary  A ganglion cyst is a non-cancerous, fluid-filled lump that occurs near a joint or tendon.  Ganglion cysts most often develop in the hand or wrist, but they can also develop in the shoulder, elbow, hip, knee, ankle, or foot.  Ganglion cysts often go away on their own without treatment. This information is not intended to replace advice given to you by your health care provider. Make sure you discuss any questions you have with your health care provider. Document Released: 03/16/2000 Document Revised: 03/01/2017 Document Reviewed: 11/16/2016 Elsevier Patient Education  2020 [employer].

## 2018-12-15 NOTE — Assessment & Plan Note (Signed)
Asymptomatic. Monitor & observe conservatively at this time. Patient will let me know if it becomes enlarged or painful, or changes.

## 2018-12-15 NOTE — Assessment & Plan Note (Signed)
Again could not palpate thyroid nodule on exam today. Continue to monitor clinically.

## 2018-12-15 NOTE — Assessment & Plan Note (Addendum)
Blood pressure elevated, including on recheck. Gave option of increasing amlodipine to 10mg  daily or following up for RN blood pressure check in the next week. She prefers to recheck in a week. If persistently elevated at that time will increase amlodipine. Check CMET today.

## 2018-12-15 NOTE — Progress Notes (Signed)
Date of Visit: 12/15/2018   HPI:  Laura Griffin presents for follow up.  Knot on wrist - last week noticed knot on the dorsal aspect of her R wrist. Does not hurt, just noticed it last week. No history of this in the past.   Hypertension - denies chest pain or shortness of breath. Does check blood pressure and usually gets 140s/70s or 80s at home. Took her blood pressure medications this AM. Current regimen includes amlodipine 5mg  daily, HCTZ 25mg  daily, metoprolol XL 100mg  daily.  GERD - earlier in the year finished taking protonix. Saw her GI doctor earlier in the year and they decided she would try to go without it. Has not had any reflux symptoms since stopping it and is happy with being off it.  Would like high dose flu shot today  ROS: See HPI.  [place]: history of lichen sclerosis, hypertension, gerd, hyperlipidemia, OA  PHYSICAL EXAM: BP (!) 160/88   Pulse (!) 57   LMP 04/02/1996   SpO2 98%  Gen: no acute distress, pleasant, cooperative HEENT: no thyroid nodules or masses palpable. No anterior cervical or supraclavicular lymphadenopathy.  Heart: regular rate and rhythm, no murmur Lungs: clear to auscultation bilaterally, normal work of breathing  Neuro: alert, speech normal Ext: No appreciable lower extremity edema bilaterally  R wrist with small 1cm mobile nontender nodule on dorsal aspect of wrist, consistent with ganglion cyst.  ASSESSMENT/PLAN:  Health maintenance:  -high dose flu shot given today  Ganglion cyst of dorsum of right wrist Asymptomatic. Monitor & observe conservatively at this time. Patient will let me know if it becomes enlarged or painful, or changes.  HYPERTENSION, BENIGN SYSTEMIC Blood pressure elevated, including on recheck. Gave option of increasing amlodipine to 10mg  daily or following up for RN blood pressure check in the next week. She prefers to recheck in a week. If persistently elevated at that time will increase amlodipine. Check CMET  today.  GERD (gastroesophageal reflux disease) Doing well, now off medications.   Thyroid nodule Again could not palpate thyroid nodule on exam today. Continue to monitor clinically.   FOLLOW UP: Follow up in 1 week for RN blood pressure check Follow up with me in 6 months as long as blood pressure is better at blood pressure check visit   [place] J. Ardelia Mems, [place]

## 2018-12-15 NOTE — Assessment & Plan Note (Signed)
Doing well, now off medications.

## 2018-12-16 LAB — CMP14+EGFR
ALT: 22 IU/L (ref 0–32)
AST: 26 IU/L (ref 0–40)
Albumin/Globulin Ratio: 1.7 (ref 1.2–2.2)
Albumin: 4.9 g/dL — ABNORMAL HIGH (ref 3.7–4.7)
Alkaline Phosphatase: 72 IU/L (ref 39–117)
BUN/Creatinine Ratio: 16 (ref 12–28)
BUN: 15 mg/dL (ref 8–27)
Bilirubin Total: 0.5 mg/dL (ref 0.0–1.2)
CO2: 27 mmol/L (ref 20–29)
Calcium: 11.2 mg/dL — ABNORMAL HIGH (ref 8.7–10.3)
Chloride: 102 mmol/L (ref 96–106)
Creatinine, Ser: 0.96 mg/dL (ref 0.57–1.00)
GFR calc Af Amer: 67 mL/min/{1.73_m2} (ref >59)
GFR calc non Af Amer: 58 mL/min/{1.73_m2} — ABNORMAL LOW (ref >59)
Globulin, Total: 2.9 g/dL (ref 1.5–4.5)
Glucose: 92 mg/dL (ref 65–99)
Potassium: 3.4 mmol/L — ABNORMAL LOW (ref 3.5–5.2)
Sodium: 144 mmol/L (ref 134–144)
Total Protein: 7.8 g/dL (ref 6.0–8.5)

## 2018-12-17 ENCOUNTER — Other Ambulatory Visit: Payer: Self-pay | Admitting: Family Medicine

## 2018-12-19 ENCOUNTER — Telehealth: Payer: Self-pay | Admitting: Family Medicine

## 2018-12-19 NOTE — Telephone Encounter (Signed)
Called patient to discuss labs. Ca was high. Will repeat, she is coming in on Monday anyways for a blood pressure check with nurse.  If remains high will likely need to stop HCTZ. Will refill HCTZ in the meantime.  Patient appreciative.  Leeanne Rio, MD

## 2018-12-22 ENCOUNTER — Other Ambulatory Visit: Payer: Self-pay

## 2018-12-22 ENCOUNTER — Ambulatory Visit (INDEPENDENT_AMBULATORY_CARE_PROVIDER_SITE_OTHER): Payer: Medicare Other | Admitting: Family Medicine

## 2018-12-22 ENCOUNTER — Other Ambulatory Visit: Payer: Medicare Other

## 2018-12-22 NOTE — Progress Notes (Signed)
Patient here today for BP check.      Last BP was on 160/80 and was on 12/15/18.  BP today is 160/92.  Pt admits to not taking BP meds this am because she woke up late.  Discuss the importance of taking meds daily.  She also states that she has been eating more pork recently. Advised that was high in sodium and to try an cut back on that.  She will return next Tuesday for another RN visit (BP Check) unless differently indicated by Dr. Ardelia Mems.  Checked BP in left arm with regular cuff.    Symptoms present: none.   Patient last took BP med yesterday.  Routed note to PCP.      Pt also had future orders for lab.  Drawn today.   Christen Bame, CMA

## 2018-12-22 NOTE — Progress Notes (Signed)
Visit was inadvertently scheduled as office visit. No charge from me - I did not see patient. Agree with rechecking blood pressure in a week after patient has taken medications.  Leeanne Rio, MD

## 2018-12-23 ENCOUNTER — Telehealth: Payer: Self-pay | Admitting: Family Medicine

## 2018-12-23 LAB — BASIC METABOLIC PANEL
BUN/Creatinine Ratio: 13 (ref 12–28)
BUN: 11 mg/dL (ref 8–27)
CO2: 26 mmol/L (ref 20–29)
Calcium: 11.4 mg/dL — ABNORMAL HIGH (ref 8.7–10.3)
Chloride: 102 mmol/L (ref 96–106)
Creatinine, Ser: 0.83 mg/dL (ref 0.57–1.00)
GFR calc Af Amer: 80 mL/min/{1.73_m2} (ref >59)
GFR calc non Af Amer: 69 mL/min/{1.73_m2} (ref >59)
Glucose: 89 mg/dL (ref 65–99)
Potassium: 3.6 mmol/L (ref 3.5–5.2)
Sodium: 143 mmol/L (ref 134–144)

## 2018-12-23 LAB — ALBUMIN: Albumin: 5 g/dL — ABNORMAL HIGH (ref 3.7–4.7)

## 2018-12-23 MED ORDER — AMLODIPINE BESYLATE 10 MG PO TABS: 10.0000 mg | ORAL_TABLET | Freq: Every day | ORAL | 1 refills | Status: DC

## 2018-12-23 NOTE — Telephone Encounter (Signed)
Called patient about lab results - calcium still high Will stop HCTZ. Increase amlodipine to 10mg  daily. Recheck calcium/albumin and blood pressure next week. Lab visit scheduled at same time as RN blood pressure check. Patient appreciative.  Leeanne Rio, MD

## 2018-12-24 ENCOUNTER — Other Ambulatory Visit (HOSPITAL_COMMUNITY)
Admission: RE | Admit: 2018-12-24 | Discharge: 2018-12-24 | Disposition: A | Discharge status: Home / Self Care | Payer: Medicare Other | Source: Ambulatory Visit | Attending: Certified Nurse Midwife | Admitting: Certified Nurse Midwife

## 2018-12-24 ENCOUNTER — Encounter: Payer: Self-pay | Admitting: Certified Nurse Midwife

## 2018-12-24 ENCOUNTER — Ambulatory Visit (INDEPENDENT_AMBULATORY_CARE_PROVIDER_SITE_OTHER): Payer: Medicare Other | Admitting: Certified Nurse Midwife

## 2018-12-24 ENCOUNTER — Other Ambulatory Visit: Payer: Self-pay

## 2018-12-24 VITALS — BP 130/64 | HR 68 | Temp 97.1°F | Resp 16 | Ht 63.75 in | Wt 185.0 lb

## 2018-12-24 DIAGNOSIS — N904 Leukoplakia of vulva: Secondary | ICD-10-CM

## 2018-12-24 DIAGNOSIS — Z780 Asymptomatic menopausal state: Secondary | ICD-10-CM | POA: Diagnosis not present

## 2018-12-24 DIAGNOSIS — Z124 Encounter for screening for malignant neoplasm of cervix: Secondary | ICD-10-CM

## 2018-12-24 DIAGNOSIS — Z1151 Encounter for screening for human papillomavirus (HPV): Secondary | ICD-10-CM | POA: Insufficient documentation

## 2018-12-24 DIAGNOSIS — Z01419 Encounter for gynecological examination (general) (routine) without abnormal findings: Secondary | ICD-10-CM | POA: Diagnosis not present

## 2018-12-24 NOTE — Patient Instructions (Signed)
EXERCISE AND DIET:  We recommended that you start or continue a regular exercise program for good health. Regular exercise means any activity that makes your heart beat faster and makes you sweat.  We recommend exercising at least 30 minutes per day at least 3 days a week, preferably 4 or 5.  We also recommend a diet low in fat and sugar.  Inactivity, poor dietary choices and obesity can cause diabetes, heart attack, stroke, and kidney damage, among others.    ALCOHOL AND SMOKING:  Women should limit their alcohol intake to no more than 7 drinks/beers/glasses of wine (combined, not each!) per week. Moderation of alcohol intake to this level decreases your risk of breast cancer and liver damage. And of course, no recreational drugs are part of a healthy lifestyle.  And absolutely no smoking or even second hand smoke. Most people know smoking can cause heart and lung diseases, but did you know it also contributes to weakening of your bones? Aging of your skin?  Yellowing of your teeth and nails?  CALCIUM AND VITAMIN D:  Adequate intake of calcium and Vitamin D are recommended.  The recommendations for exact amounts of these supplements seem to change often, but generally speaking 600 mg of calcium (either carbonate or citrate) and 800 units of Vitamin D per day seems prudent. Certain women may benefit from higher intake of Vitamin D.  If you are among these women, your doctor will have told you during your visit.    PAP SMEARS:  Pap smears, to check for cervical cancer or precancers,  have traditionally been done yearly, although recent scientific advances have shown that most women can have pap smears less often.  However, every woman still should have a physical exam from her gynecologist every year. It will include a breast check, inspection of the vulva and vagina to check for abnormal growths or skin changes, a visual exam of the cervix, and then an exam to evaluate the size and shape of the uterus and  ovaries.  And after 75 years of age, a rectal exam is indicated to check for rectal cancers. We will also provide age appropriate advice regarding health maintenance, like when you should have certain vaccines, screening for sexually transmitted diseases, bone density testing, colonoscopy, mammograms, etc.   MAMMOGRAMS:  All women over 40 years old should have a yearly mammogram. Many facilities now offer a "3D" mammogram, which may cost around $50 extra out of pocket. If possible,  we recommend you accept the option to have the 3D mammogram performed.  It both reduces the number of women who will be called back for extra views which then turn out to be normal, and it is better than the routine mammogram at detecting truly abnormal areas.    COLONOSCOPY:  Colonoscopy to screen for colon cancer is recommended for all women at age 50.  We know, you hate the idea of the prep.  We agree, BUT, having colon cancer and not knowing it is worse!!  Colon cancer so often starts as a polyp that can be seen and removed at colonscopy, which can quite literally save your life!  And if your first colonoscopy is normal and you have no family history of colon cancer, most women don't have to have it again for 10 years.  Once every ten years, you can do something that may end up saving your life, right?  We will be happy to help you get it scheduled when you are ready.    Be sure to check your insurance coverage so you understand how much it will cost.  It may be covered as a preventative service at no cost, but you should check your particular policy.      Lichen Sclerosus Lichen sclerosus is a skin problem. It can happen on any part of the body. It happens most often in the anal or genital areas. It can cause itching and discomfort. Treatment can help to control symptoms. It can also help prevent scarring that may lead to other problems. What are the causes? The cause of this condition is not known. It is not passed from  one person to another (not contagious). What increases the risk? This condition is more likely to develop in women. It most often occurs after menopause. What are the signs or symptoms? Symptoms of this condition include:  Thin, wrinkled, white areas on the skin.  Thickened white areas on the skin.  Red and swollen patches (lesions) on the skin.  Tears or cracks in the skin.  Bruising.  Blood blisters.  Very bad itching.  Pain, itching, or burning when peeing (urinating).  Trouble pooping (constipation). How is this diagnosed? This condition may be diagnosed with a physical exam. A sample of your skin may also be removed to be looked at under a microscope (biopsy). How is this treated? This condition may be treated with:  Creams or ointments (topical steroids) that are put on the skin in the affected areas. This is the most common treatment.  Medicines that are taken by mouth.  Surgery. This is only needed if the condition is very bad and is causing problems such as scarring. Follow these instructions at home:  Take or use over-the-counter and prescription medicines only as told by your doctor.  Use creams or ointments as told by your doctor.  Do not scratch the affected areas of skin.  If you are a woman, keep the vagina as clean and dry as you can.  Clean the affected area of skin gently with water. Avoid using rough towels or toilet paper.  Keep all follow-up visits as told by your doctor. This is important. Contact a doctor if:  Your redness, swelling, or pain gets worse.  You have fluid, blood, or pus coming from the area.  You have new red and swollen patches on your skin.  You have a fever.  You have pain during sex. Summary  Lichen sclerosus is a skin problem. It can cause itching and discomfort.  This condition is usually treated with creams or ointments that are put on the skin in the affected areas.  Use medicines only as told by your doctor.   Do not scratch the affected areas of skin.  Keep all follow-up visits as told by your doctor. This is important. This information is not intended to replace advice given to you by your health care provider. Make sure you discuss any questions you have with your health care provider. Document Released: 03/01/2008 Document Revised: 08/01/2017 Document Reviewed: 08/01/2017 Elsevier Patient Education  2020 [employer].

## 2018-12-24 NOTE — Progress Notes (Signed)
73 y.o. G1P1 Married  African American Fe here for annual exam. Post menopausal no vaginal bleeding or vaginal dryness change. Lichen Sclerosus is stable at present. No flares recently. Seeing PCP Dr. Fortunato Curling for hypertension , Vitamin D, Gabapentin management. Denies urinary or bowel issues today. Ambulates with cane assistance no falls! Staying busy with grandchildren.  [name] last menstrual period was 04/02/1996.          Sexually active: No.  The current method of family planning is tubal ligation and post menopausal status.    Exercising: No.  exercise Smoker:  no  Review of Systems  Constitutional: Negative.   HENT: Negative.   Eyes: Negative.   Respiratory: Negative.   Cardiovascular: Negative.   Gastrointestinal: Negative.   Genitourinary: Negative.   Musculoskeletal: Negative.   Skin: Negative.   Neurological: Negative.   Endo/Heme/Allergies: Negative.   Psychiatric/Behavioral: Negative.     Health Maintenance: Pap:  12-13-15 neg History of Abnormal Pap: no MMG:  08-27-18 category b density birads 1:neg Self Breast exams: yes Colonoscopy:  2016 neg BMD:   2011, unsure if update done, PCP manages TDaP:  2019 Shingles: 2014 Pneumonia: 2015 Hep C and HIV: hep c neg 2016 Labs: PCP does all labs   reports that she quit smoking about 24 years ago. Her smoking use included cigarettes. She has a 7.50 pack-year smoking history. She has never used smokeless tobacco. She reports that she does not drink alcohol or use drugs.  Past Medical History:  Diagnosis Date  . Allergic rhinitis 07/19/2014  . Arthritis of knee, right 03/11/2006  . Bursitis of shoulder, right 09/19/1999  . Cervical radiculopathy at C6 05/24/2011   Beginning Nov 2012. Multi-level foraminal narrowing in C-spine plain films   . Diverticulosis   . DIVERTICULOSIS OF COLON 05/30/2006   Colonoscopy 06/2014 - multiple diverticula, no other abnormalities, no further colonoscopies recommended given patient's age of  69    . GERD (gastroesophageal reflux disease) 07/19/2014   Diagnosed clinically by [facility] in January of 2016.    . Glaucoma   . Hearing decreased 08/07/2011   With whooshing tinnitus Mild bilateral loss on 08/2012 audiology who will recheck her hearing in one year    . History of herniated intervertebral disc   . Hypertension   . HYPERTENSION, BENIGN SYSTEMIC 05/30/2006       . Left sided sciatica 8/95,11/00   CT confirmed   . LOW BACK PAIN SYNDROME 05/10/2009   Qualifier: Diagnosis of  By: Javier Glazier CMA,, [place]  Mild C 3-4, 4-5 lumbar spinal stenosis MRI 08/2011, Diffuse lower thoracic and lumbar DDD   . Mitral valve prolapse    per patient heart murmur  . Orthostatic hypotension 06/12/2013  . Osteoarthritis   . Osteoarthritis of right knee 12/26/2012   Confirmed on xray 07/2014   . OSTEOARTHRITIS, MULTI SITES 05/30/2006   She is stoic about pain and wants to avoid surgeries Past xrays have documented DJD in right knee and lumbar spine    . Overweight (BMI 25.0-29.9) 05/30/2006  . Pitting edema 07/19/2014   Bilateral 1+ pitting edema of calves first noted on 07/19/2014.    . Sciatica 05/30/2006   Qualifier: Diagnosis of  By: Walker Kehr MD, [place]  Involved left leg in 02/1999   . Swallowing difficulty 07/06/2013  . Urge incontinence 06/01/2008   Qualifier: Diagnosis of  By: Walker Kehr MD, Patrick [place]      Past Surgical History:  Procedure Laterality Date  . CATARACT EXTRACTION  02/2011  left eye  . TUBAL LIGATION  1973  . VULVA /PERINEUM BIOPSY  2010   for hypopigmentation, non-specific result    Current Outpatient Medications  Medication Sig Dispense Refill  . acetaminophen (TYLENOL) 650 MG CR tablet Take 2 tablets (1,300 mg total) by mouth every 8 (eight) hours as needed. (Patient taking differently: Take 1,300 mg by mouth 3 (three) times daily. ) 90 tablet 1  . amLODipine (NORVASC) 10 MG tablet Take 1 tablet (10 mg total) by mouth at bedtime. 90 tablet 1  . aspirin EC 81 MG tablet Take 81 mg  by mouth daily.    . brimonidine-timolol (COMBIGAN) 0.2-0.5 % ophthalmic solution Place 2 drops into both eyes every 12 (twelve) hours.      . Cholecalciferol (VITAMIN D3) 1000 UNIT capsule Take 1,000 Units by mouth daily.      . clobetasol ointment (TEMOVATE) 0.05 % Apply thinly to vulva area as instructed twice daily for one week and then once daily for one week 60 g 1  . gabapentin (NEURONTIN) 400 MG capsule TAKE 2 CAPSULES BY MOUTH THREE TIMES DAILY 540 capsule 1  . hydrochlorothiazide (HYDRODIURIL) 25 MG tablet TAKE 1 TABLET BY MOUTH EVERY DAY 90 tablet 1  . latanoprost (XALATAN) 0.005 % ophthalmic solution PLACE 1 DROP INTO BOTH EYES EVERY EVENING/BEDTIME    . metoprolol succinate (TOPROL-XL) 100 MG 24 hr tablet TAKE 1 TABLET BY MOUTH DAILY. TAKE WITH OR IMMEDIATELY FOLLOWING A MEAL 90 tablet 3  . Multiple Vitamin (MULTIVITAMIN) tablet Take 1 tablet by mouth daily.    . Omega-3 Fatty Acids (FISH OIL PO) Take by mouth.     No current facility-administered medications for this visit.     Family History  Problem Relation Age of Onset  . Breast cancer Sister        69  . Breast cancer Sister 80  . Hypertension Father   . Heart disease Father   . Stroke Mother 37       cerebral hemorrhage  . Hypertension Mother     ROS:  Pertinent items are noted in HPI.  Otherwise, a comprehensive ROS was negative.  Exam:   BP 130/64   Pulse 68   Temp (!) 97.1 F (36.2 C) (Skin)   Resp 16   Ht 5' 3.75" (1.619 m)   Wt 185 lb (83.9 kg)   LMP 04/02/1996   BMI 32.00 kg/m  Height: 5' 3.75" (161.9 cm) Ht Readings from Last 3 Encounters:  12/24/18 5' 3.75" (1.619 m)  03/14/18 5' 3.5" (1.613 m)  12/20/17 5' 3.5" (1.613 m)    General appearance: alert, cooperative and appears stated age Head: Normocephalic, without obvious abnormality, atraumatic Neck: no adenopathy, supple, symmetrical, trachea midline and thyroid normal to inspection and palpation Lungs: clear to auscultation  bilaterally Breasts: normal appearance, no masses or tenderness, No nipple retraction or dimpling, No nipple discharge or bleeding, No axillary or supraclavicular adenopathy, large pendulous Heart: regular rate and rhythm Abdomen: soft, non-tender; no masses,  no organomegaly Extremities: extremities normal, atraumatic, no cyanosis or edema Skin: Skin color, texture, turgor normal. No rashes or lesions Lymph nodes: Cervical, supraclavicular, and axillary nodes normal. No abnormal inguinal nodes palpated Neurologic: Grossly normal   Pelvic: External genitalia:  no lesions, loss of skin pigmentation from Lichen Sclerosus              Urethra:  normal appearing urethra with no masses, tenderness or lesions  Bartholin's and Skene's: normal                 Vagina: normal appearing vagina with normal color and discharge, no lesions              Cervix: no cervical motion tenderness, no lesions and normal appearance              Pap taken: Yes.   Bimanual Exam:  Uterus:  normal size, contour, position, consistency, mobility, non-tender and anteverted              Adnexa: normal adnexa and no mass, fullness, tenderness               Rectovaginal: Confirms               Anus:  normal sphincter tone, no lesions, loss of skin pigmentation due to Lichen  Chaperone present: yes  A:  Well Woman with normal exam  Menopausal no HRT  Atrophic vaginitis  Lichen sclerosus chronic Clobetasol working well to control flares  Hypertension, Vitamin D management with PCP  P:   Reviewed health and wellness pertinent to exam  Aware of need to advise if vaginal bleeding or vaginal dryness has increased.  Patient aware to use Clobetasol only during flares for recommended treatment length. Has refill.  Continue follow up with PCP as indicated.  Pap smear: yes   counseled on breast self exam, mammography screening, feminine hygiene, adequate intake of calcium and vitamin D, diet and exercise,  Kegel's exercises  return annually or prn  An After Visit Summary was printed and given to the patient.

## 2018-12-25 ENCOUNTER — Ambulatory Visit: Payer: Medicare Other | Admitting: Certified Nurse Midwife

## 2018-12-30 ENCOUNTER — Other Ambulatory Visit: Payer: Medicare Other

## 2018-12-30 ENCOUNTER — Other Ambulatory Visit: Payer: Self-pay

## 2018-12-30 ENCOUNTER — Ambulatory Visit: Payer: Medicare Other

## 2018-12-30 DIAGNOSIS — I10 Essential (primary) hypertension: Secondary | ICD-10-CM

## 2018-12-30 NOTE — Progress Notes (Signed)
Patient presents in nurse clinic for blood pressure check. Patient stopped HCTZ and increased Amlodipine, as directed on 9/22. Patients BP today 132/80 checked in left arm with large cuff. Rechecked 15 minutes later, 130/75. Patient took Amlodipine and Metoprolol at 9:00am this morning. Patient denies SOB, headache, or blurry vision. Patient was sent to lab for calcium/albumin recheck.

## 2018-12-31 LAB — ALBUMIN: Albumin: 4.8 g/dL — ABNORMAL HIGH (ref 3.7–4.7)

## 2018-12-31 LAB — BASIC METABOLIC PANEL
BUN/Creatinine Ratio: 14 (ref 12–28)
BUN: 13 mg/dL (ref 8–27)
CO2: 23 mmol/L (ref 20–29)
Calcium: 10.9 mg/dL — ABNORMAL HIGH (ref 8.7–10.3)
Chloride: 103 mmol/L (ref 96–106)
Creatinine, Ser: 0.94 mg/dL (ref 0.57–1.00)
GFR calc Af Amer: 69 mL/min/{1.73_m2} (ref >59)
GFR calc non Af Amer: 60 mL/min/{1.73_m2} (ref >59)
Glucose: 97 mg/dL (ref 65–99)
Potassium: 3.7 mmol/L (ref 3.5–5.2)
Sodium: 142 mmol/L (ref 134–144)

## 2019-01-01 LAB — CYTOLOGY - PAP
Diagnosis: NEGATIVE
High risk HPV: NEGATIVE
Molecular Disclaimer: 56
Molecular Disclaimer: NORMAL

## 2019-01-09 ENCOUNTER — Telehealth: Payer: Self-pay | Admitting: Family Medicine

## 2019-01-09 NOTE — Telephone Encounter (Signed)
Called patient to discuss labs. Ca lower but still above normal despite stopping HCTZ. Will have her come in for PTH check. Order placed, lab visit scheduled for Tues. Patient appreciative  Laura Rio, MD

## 2019-01-13 ENCOUNTER — Other Ambulatory Visit: Payer: Medicare Other

## 2019-01-13 ENCOUNTER — Other Ambulatory Visit: Payer: Self-pay

## 2019-01-14 LAB — PTH, INTACT AND CALCIUM
Calcium: 10.6 mg/dL — ABNORMAL HIGH (ref 8.7–10.3)
PTH: 41 pg/mL (ref 15–65)

## 2019-02-04 ENCOUNTER — Telehealth: Payer: Self-pay | Admitting: Family Medicine

## 2019-02-04 NOTE — Telephone Encounter (Signed)
Called patient to discuss labs. Calcium remains mildly elevated and PTH, though technically normal, is inappropriately high given her calcium elevation. Suggestive of possible primary hyperparathyroidism but first need to evaluate for [place]. Explained need for 24h urine calcium measurement. Order placed. I am contacting Schuyler Amor in the lab to determine what patient needs to do to have this collected. Advised patient will hear from [place] and should wait for further instruction. Patient appreciative.  Leeanne Rio, MD

## 2019-02-05 NOTE — Telephone Encounter (Signed)
Yes, we have the 24 hr collection container here. If Laura Griffin will come in, I will be glad to get her set up with the proper collection and storage instructions. No need for a lab appointment as she will just be picking up the collection container and instructions. She can just let the front desk know when she gets here that she needs to pick up something from the lab. [name] [name]

## 2019-02-05 NOTE — Telephone Encounter (Signed)
Red team, can you contact patient and let her know that she can just stop by at any time to get the urine collection container and receive instructions on how to collect her urine? See Robert's message below.  Thanks Leeanne Rio, MD

## 2019-02-06 NOTE — Telephone Encounter (Signed)
Left message for patient to call office.  Patient needs to come in to pick up 24 hour collection container from [place] in the lab but does not need appointment.  This can be done at anytime.  Ozella Almond, [place]

## 2019-02-06 NOTE — Telephone Encounter (Signed)
Patient calls nurse line stating she missed a call. Patient informed she can come by our office this afternoon to collect container. Patient verbalized understanding.

## 2019-02-09 ENCOUNTER — Other Ambulatory Visit: Payer: Self-pay

## 2019-02-10 LAB — CALCIUM, URINE, 24 HOUR
Calcium, 24H Urine: 91 mg/24 hr (ref 26–354)
Calcium, Urine: 6.2 mg/dL

## 2019-02-12 ENCOUNTER — Telehealth: Payer: Self-pay | Admitting: Family Medicine

## 2019-02-12 NOTE — Telephone Encounter (Signed)
Called patient. Received 24h urine calcium results. Results seem low. Could be [place] or still could be primary hyperparthyroidism. I think at this point she is best served by seeing an endocrinologist to further evaluate. She is agreeable. Will place referral. Patient appreciative.  Leeanne Rio, MD

## 2019-04-28 ENCOUNTER — Other Ambulatory Visit: Payer: Self-pay

## 2019-04-28 MED ORDER — GABAPENTIN 400 MG PO CAPS: 800.0000 mg | ORAL_CAPSULE | Freq: Three times a day (TID) | ORAL | 0 refills | Status: DC

## 2019-04-29 ENCOUNTER — Other Ambulatory Visit: Payer: Self-pay

## 2019-04-29 DIAGNOSIS — L900 Lichen sclerosus et atrophicus: Secondary | ICD-10-CM

## 2019-04-29 MED ORDER — CLOBETASOL PROPIONATE 0.05 % EX OINT: TOPICAL_OINTMENT | CUTANEOUS | 1 refills | Status: DC

## 2019-04-29 NOTE — Telephone Encounter (Signed)
Medication refill request: Clobetasol 60gm Last AEX:  12-24-18 Next AEX: 12-25-19 Last MMG (if hormonal medication request): n/a Refill authorized: Please refill if appropriate

## 2019-05-13 ENCOUNTER — Ambulatory Visit: Payer: 59 | Admitting: Internal Medicine

## 2019-05-18 DIAGNOSIS — H5203 Hypermetropia, bilateral: Secondary | ICD-10-CM | POA: Diagnosis not present

## 2019-05-18 DIAGNOSIS — H524 Presbyopia: Secondary | ICD-10-CM | POA: Diagnosis not present

## 2019-05-18 DIAGNOSIS — H401133 Primary open-angle glaucoma, bilateral, severe stage: Secondary | ICD-10-CM | POA: Diagnosis not present

## 2019-05-18 DIAGNOSIS — Z961 Presence of intraocular lens: Secondary | ICD-10-CM | POA: Diagnosis not present

## 2019-05-18 DIAGNOSIS — H52223 Regular astigmatism, bilateral: Secondary | ICD-10-CM | POA: Diagnosis not present

## 2019-05-22 ENCOUNTER — Other Ambulatory Visit: Payer: Self-pay

## 2019-05-26 ENCOUNTER — Encounter: Payer: Self-pay | Admitting: Internal Medicine

## 2019-05-26 ENCOUNTER — Other Ambulatory Visit: Payer: Self-pay

## 2019-05-26 ENCOUNTER — Ambulatory Visit (INDEPENDENT_AMBULATORY_CARE_PROVIDER_SITE_OTHER): Payer: Medicare Other | Admitting: Internal Medicine

## 2019-05-26 DIAGNOSIS — E041 Nontoxic single thyroid nodule: Secondary | ICD-10-CM | POA: Diagnosis not present

## 2019-05-26 LAB — TSH: TSH: 1.87 u[IU]/mL (ref 0.35–4.50)

## 2019-05-26 LAB — VITAMIN D 25 HYDROXY (VIT D DEFICIENCY, FRACTURES): VITD: 31.79 ng/mL (ref 30.00–100.00)

## 2019-05-26 NOTE — Progress Notes (Addendum)
Patient ID: Laura Griffin, female   DOB: 1946-01-25, 74 y.o.   MRN: [id number]   This visit occurred during the SARS-CoV-2 public health emergency.  Safety protocols were in place, including screening questions prior to the visit, additional usage of staff PPE, and extensive cleaning of exam room while observing appropriate contact time as indicated for disinfecting solutions.   HPI  Laura Griffin is a 74 y.o.-year-old female, referred by her PCP, Dr. Ardelia Mems, for evaluation for hypercalcemia/hyperparathyroidism.  Pt mentions she was found to have a high calcium level in ~2005 when she was on calcium supplements.  She was advised to stop the supplements and calcium normalized since then until 2019, when her calcium started to return elevated again.  She is taking multivitamins with calcium.  I reviewed pt's pertinent labs: Lab Results  Component Value Date   PTH 41 01/13/2019   PTH Comment 01/13/2019   PTH 44.3 04/25/2011   PTH 23.8 08/28/2007   CALCIUM 10.6 (H) 01/13/2019   CALCIUM 10.9 (H) 12/30/2018   CALCIUM 11.4 (H) 12/22/2018   CALCIUM 11.2 (H) 12/15/2018   CALCIUM 10.3 09/02/2017   CALCIUM 10.5 (H) 07/02/2017   CALCIUM 10.4 09/16/2015   CALCIUM 10.2 05/05/2015   CALCIUM 10.4 08/02/2014   CALCIUM 10.4 01/15/2014   Of note, patient does have a high albumin, however, the corrected calcium level was also slightly high: 12/15/2018: Corrected calcium 10.48 (ULN 10.3).  No history of osteoporosis.  I reviewed pt's DXA scan report from 2011 and her T-scores were normal.  No fractures or falls.   No h/o kidney stones.  24-hour urine calcium (02/08/2019) was normal, with a calcium of 91 mg/24 h, however, a urine creatinine level was not checked at that time, so it is unclear if this was an appropriate collection.  No h/o CKD. Last BUN/Cr: Lab Results  Component Value Date   BUN 13 12/30/2018   BUN 11 12/22/2018   CREATININE 0.94 12/30/2018   CREATININE 0.83 12/22/2018    Pt is not on HCTZ now.  She was on this before, stopped in 08-12/2018.  No h/o vitamin D deficiency. Reviewed vit D levels: No results found for: VD25OH  Pt is on calcium (multivitamins - 300 mg) + 1000 units vitamin D daily.  Pt does not have a FH of hypercalcemia, pituitary tumors, thyroid cancer, or osteoporosis.   Pt. also has a history of HTN, OA, GERD (not on PPIs), sciatica.  Her mother died of cerebral hemorrhage in her late 76P as a complication of uncontrolled hypertension.  ROS: Constitutional: no weight gain/loss, no fatigue, no subjective hyperthermia/hypothermia, + excessive urination and nocturia Eyes: no blurry vision, no xerophthalmia ENT: no sore throat, no nodules palpated in throat, + dysphagia, no hoarseness, + decreased hearing Cardiovascular: no CP/SOB/palpitations/leg swelling Respiratory: + Cough/no SOB/+ wheezing Gastrointestinal: no N/V/D/C Musculoskeletal: no muscle/+ joint aches Skin: no rashes, + itching Neurological: no tremors/numbness/tingling/dizziness Psychiatric: no depression/anxiety  Past Medical History:  Diagnosis Date  . Allergic rhinitis 07/19/2014  . Arthritis of knee, right 03/11/2006  . Bursitis of shoulder, right 09/19/1999  . Cervical radiculopathy at C6 05/24/2011   Beginning Nov 2012. Multi-level foraminal narrowing in C-spine plain films   . Diverticulosis   . DIVERTICULOSIS OF COLON 05/30/2006   Colonoscopy 06/2014 - multiple diverticula, no other abnormalities, no further colonoscopies recommended given patient's age of 1    . GERD (gastroesophageal reflux disease) 07/19/2014   Diagnosed clinically by [facility] in January of 2016.    [name]  Glaucoma   . Hearing decreased 08/07/2011   With whooshing tinnitus Mild bilateral loss on 08/2012 audiology who will recheck her hearing in one year    . History of herniated intervertebral disc   . Hypertension   . HYPERTENSION, BENIGN SYSTEMIC 05/30/2006       . Left sided  sciatica 8/95,11/00   CT confirmed   . LOW BACK PAIN SYNDROME 05/10/2009   Qualifier: Diagnosis of  By: Javier Glazier CMA,, [place]  Mild C 3-4, 4-5 lumbar spinal stenosis MRI 08/2011, Diffuse lower thoracic and lumbar DDD   . Mitral valve prolapse    per patient heart murmur  . Orthostatic hypotension 06/12/2013  . Osteoarthritis   . Osteoarthritis of right knee 12/26/2012   Confirmed on xray 07/2014   . OSTEOARTHRITIS, MULTI SITES 05/30/2006   She is stoic about pain and wants to avoid surgeries Past xrays have documented DJD in right knee and lumbar spine    . Overweight (BMI 25.0-29.9) 05/30/2006  . Pitting edema 07/19/2014   Bilateral 1+ pitting edema of calves first noted on 07/19/2014.    . Sciatica 05/30/2006   Qualifier: Diagnosis of  By: Walker Kehr MD, [place]  Involved left leg in 02/1999   . Swallowing difficulty 07/06/2013  . Urge incontinence 06/01/2008   Qualifier: Diagnosis of  By: Walker Kehr MD, Patrick [place]     Past Surgical History:  Procedure Laterality Date  . CATARACT EXTRACTION  02/2011   left eye  . TUBAL LIGATION  1973  . VULVA /PERINEUM BIOPSY  2010   for hypopigmentation, non-specific result   Social History   Socioeconomic History  . Marital status: Married    Spouse name: Jeneen Rinks  . Number of children: 1  . Years of education: 18  . Highest education level: Not on file  Occupational History  . Occupation: retired    Fish farm manager: [employer]    Comment: career development  Tobacco Use  . Smoking status: Former Smoker    Packs/day: 0.50    Years: 15.00    Pack years: 7.50    Types: Cigarettes    Quit date: 04/02/1994    Years since quitting: 25.1  . Smokeless tobacco: Never Used  Substance and Sexual Activity  . Alcohol use: No  . Drug use: No  . Sexual activity: Not Currently    Partners: Male    Birth control/protection: Surgical    Comment: BTSP  Other Topics Concern  . Not on file  Social History Narrative   Husband in remission for multiple [place]:     Emergency Contact: husband, Jeneen Rinks, (c) [phone]   End of Life Plan:  Pt reported done, will bring at next office visit 07/2013   Diet: Patient has a varied diet of protein, starch, and vegetables.   Exercise: Pt does not have any regular exercise routine.   Seatbelts: Pt reports wearing seat belt when in vehicle.    Hobbies: crosswords, shopping, reading.       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 Ducatte, RN, BSN                                                                                                          Social Determinants of Health   Financial Resource Strain:   . Difficulty of Paying Living Expenses: Not on file  Food Insecurity:   . Worried About Charity fundraiser in the Last Year: Not on file  . Ran Out of Food in the Last Year: Not on file  Transportation Needs:   . Lack of Transportation (Medical): Not on file  . Lack of Transportation (Non-Medical): Not on file  Physical Activity:   . Days of Exercise per Week: Not on file  . Minutes of Exercise per Session: Not on file  Stress:   . Feeling of Stress : Not on file  Social Connections:   . Frequency  of Communication with Friends and Family: Not on file  . Frequency of Social Gatherings with Friends and Family: Not on file  . Attends Religious Services: Not on file  . Active Member of Clubs or Organizations: Not on file  . Attends Archivist Meetings: Not on file  . Marital Status: Not on file  Intimate Partner Violence:   . Fear of Current or Ex-Partner: Not on file  . Emotionally Abused: Not on file  . Physically Abused: Not on file  . Sexually Abused: Not on file   Current Outpatient Medications on File Prior to Visit  Medication Sig Dispense Refill  . acetaminophen (TYLENOL) 650 MG CR tablet Take 2 tablets (1,300 mg total) by mouth every 8 (eight) hours as needed. (Patient taking differently: Take 1,300 mg by mouth 3 (three) times daily. ) 90 tablet 1  . amLODipine (NORVASC) 10 MG tablet Take 1 tablet (10 mg total) by mouth at bedtime. 90 tablet 1  . aspirin EC 81 MG tablet Take 81 mg by mouth daily.    . brimonidine-timolol (COMBIGAN) 0.2-0.5 % ophthalmic solution Place 2 drops into both eyes every 12 (twelve) hours.      . Cholecalciferol (VITAMIN D3) 1000 UNIT capsule Take 1,000 Units by mouth daily.      . clobetasol ointment (TEMOVATE) 0.05 % Apply thinly to vulva area as instructed twice daily for one week and then once daily for one week and stop 60 g 1  . gabapentin (NEURONTIN) 400 MG capsule Take 2 capsules (800 mg total) by mouth 3 (three) times daily. 540 capsule 0  . latanoprost (XALATAN) 0.005 % ophthalmic solution PLACE 1 DROP INTO BOTH EYES EVERY EVENING/BEDTIME    . metoprolol succinate (TOPROL-XL) 100 MG 24 hr tablet TAKE 1  TABLET BY MOUTH DAILY. TAKE WITH OR IMMEDIATELY FOLLOWING A MEAL 90 tablet 3  . Multiple Vitamin (MULTIVITAMIN) tablet Take 1 tablet by mouth daily.    . Omega-3 Fatty Acids (FISH OIL PO) Take by mouth.     No current facility-administered medications on file prior to visit.   Allergies  Allergen Reactions  .  Sulfamethoxazole-Trimethoprim     REACTION: rash: nausea, swelling  . Hctz [Hydrochlorothiazide] Other (See Comments)    Elevated calcium   Family History  Problem Relation Age of Onset  . Breast cancer Sister        17  . Breast cancer Sister 31  . Hypertension Father   . Heart disease Father   . Stroke Mother 3       cerebral hemorrhage  . Hypertension Mother     PE: BP 130/90   Pulse (!) 56   Ht 5' 3.75" (1.619 m)   Wt 187 lb (84.8 kg)   LMP 04/02/1996   SpO2 98%   BMI 32.35 kg/m  Wt Readings from Last 3 Encounters:  12/24/18 185 lb (83.9 kg)  09/24/18 187 lb (84.8 kg)  09/10/18 189 lb (85.7 kg)   Constitutional: overweight, in NAD Eyes: PERRLA, EOMI, no exophthalmos ENT: moist mucous membranes, no thyromegaly, no cervical lymphadenopathy Cardiovascular: RRR, No MRG Respiratory: CTA B Gastrointestinal: abdomen soft, NT, ND, BS+ Musculoskeletal: no deformities, strength intact in all 4 Skin: moist, warm, no rashes Neurological: no tremor with outstretched hands, DTR normal in all 4  Assessment: 1. Hypercalcemia/hyperparathyroidism  2.  Thyroid nodule  Plan: Patient has had several instances of elevated calcium, with the highest level being at 11.4. An intact PTH level was nonsuppressed, at 41 for a calcium of 10.6.  - it is unclear whether patient also has vitamin D deficiency or not.  We will need to recheck a vitamin D level today. - No apparent complications from hypercalcemia: no h/o nephrolithiasis, no osteoporosis, no fractures. No abdominal pain, depression, bone pain. - I discussed with the patient about the physiology of calcium and parathyroid hormone, and possible side effects from increased PTH, including kidney stones, osteoporosis, abdominal pain, etc.  - We discussed that we need to check whether her hyperparathyroidism is primary (Familial hypercalcemic hypocalciuria or parathyroid adenoma) or secondary (to conditions like: vitamin D deficiency,  calcium malabsorption, hypercalciuria, renal insufficiency, etc.). - I discussed with her that we first need to make sure her vitamin D level is normal so we can further investigate the parathyroid status. I explained that in the setting of a low vitamin D, the parathyroid hormone can be elevated, which is not a pathologic finding. However, if the PTH is elevated in the setting of a normal vitamin D, we will further need to investigate her for primary or secondary hyperparathyroidism. - For now, I advised her to stop her multivitamin, but after the results of the vitamin D level are back, we may need to add an extra 1000 units vitamin D daily to account for stopping the multivitamins. - after we normalize the vitamin D level, we'll need to check: calcium level intact PTH ([facility]) Magnesium Phosphorus vitamin D- 25 HO and 1,25 HO 24h urinary calcium/creatinine ratio -we may need to repeat a collection, discussed about correct collection - We discussed possible consequences of hyperparathyroidism: ~1/3 pts will develop complications over 15 years (OP, nephrolithiasis).  - If the tests indicate a parathyroid adenoma, she agrees with a referral to surgery.  - Criteria for parathyroid surgery  are:  . Increased calcium by more than 1 mg/dL above the upper limit of normal  . Kidney ds.  . Osteoporosis (or vertebral fracture) . Age <40 years old Newer criteria (2013): [name] High UCa >400 mg/d and increased stone risk by biochemical stone risk analysis . Presence of nephrolithiasis or nephrocalcinosis . Pt's preference!  - We may need to re-check a DXA scan (since last was 10 years ago) + add a 33% distal radius for evaluation of cortical bone, which is predominantly affected by hyperparathyroidism.  - I will advise her about vitamin D supplement dose when the results of the vitamin D level are back. - I will see the patient back in 4 months  2.  Thyroid nodule -No neck compression symptoms -Patient  has a small left inferior thyroid nodule measuring 8 x 6 x 7 mm, without internal calcification or increased blood flow on her thyroid ultrasound from 05/23/2015 -We discussed that no intervention or follow-up is needed for this nodule  Component     Latest Ref Rng & Units 05/26/2019  TSH     0.35 - 4.50 uIU/mL 1.87  VITD     30.00 - 100.00 ng/mL 31.79  Labs normal.  Vitamin D on the low end of normal.  We will increase her vitamin D supplement to 2000 units daily, now that we are stopping her multivitamins.  We will order the rest of the parathyroid labs to be done after she is off the multivitamins for approximately 2 weeks: Orders Placed This Encounter  Procedures  . BASIC METABOLIC PANEL WITH GFR  . Parathyroid hormone, intact (no Ca)  . Magnesium  . Phosphorus  . Vitamin D 1,25 dihydroxy  . Calcium, urine, 24 hour  . Creatinine, urine, 24 hour   Component     Latest Ref Rng & Units 06/10/2019  Vitamin D 1, 25 (OH) Total     18 - 72 pg/mL 30  Vitamin D3 1, 25 (OH)     pg/mL 30  Vitamin D2 1, 25 (OH)     pg/mL <8  PTH, Intact     15 - 65 pg/mL 29  Phosphorus     2.3 - 4.6 mg/dL 3.1  Magnesium     1.5 - 2.5 mg/dL 1.8   Component     Latest Ref Rng & Units 06/15/2019  Calcium     8.4 - 10.5 mg/dL 10.5   Component     Latest Ref Rng & Units 06/12/2019  Creatinine, 24H Ur     0.50 - 2.15 g/24 h 0.77  Calcium, 24H Urine     mg/24 h 66   Labs normal after stopping the multivitamins.  Unfortunately, the BMP was not sent and I could only obtain a calcium level from the blood already drawn.  We do not have a kidney function so I could calculate a fractional excretion of calcium.  However, since labs are normal for now, I would suggest to just repeat them at next visit, in 4 months.  Philemon Kingdom, MD PhD [facility] End[facility]

## 2019-05-26 NOTE — Patient Instructions (Addendum)
Please stop at the lab.  Stop Multivitamins for now.  Continue 1000 units vit D daily.  Please come back for a follow-up appointment in 4 months.   Hypercalcemia Hypercalcemia is when the level of calcium in a person's blood is above normal. The body needs calcium to make bones and keep them strong. Calcium also helps the muscles, nerves, brain, and heart work the way they should. Most of the calcium in the body is in the bones. There is also some calcium in the blood. Hypercalcemia can happen when calcium comes out of the bones, or when the kidneys are not able to remove calcium from the blood. Hypercalcemia can be mild or severe. What are the causes? There are many possible causes of hypercalcemia. Common causes of this condition include:  Hyperparathyroidism. This is a condition in which the body produces too much parathyroid hormone. There are four parathyroid glands in your neck. These glands produce a chemical messenger (hormone) that helps the body absorb calcium from foods and helps your bones release calcium.  Certain kinds of cancer. Less common causes of hypercalcemia include:  Getting too much calcium or vitamin D from your diet.  Kidney failure.  Hyperthyroidism.  Severe dehydration.  Being on bed rest or being inactive for a long time.  Certain medicines.  Infections. What increases the risk? You are more likely to develop this condition if you:  Are female.  Are 72 years of age or older.  Have a family history of hypercalcemia. What are the signs or symptoms? Mild hypercalcemia that starts slowly may not cause symptoms. Severe, sudden hypercalcemia is more likely to cause symptoms, such as:  Being more thirsty than usual.  Needing to urinate more often than usual.  Abdominal pain.  Nausea and vomiting.  Constipation.  Muscle pain, twitching, or weakness.  Feeling very tired. How is this diagnosed?  Hypercalcemia is usually diagnosed with a  blood test. You may also have tests to help determine what is causing this condition, such as imaging tests and more blood tests. How is this treated? Treatment for hypercalcemia depends on the cause. Treatment may include:  Receiving fluids through an IV.  Medicines that: ? Keep calcium levels steady after receiving fluids (loop diuretics). ? Keep calcium in your bones (bisphosphonates). ? Lower the calcium level in your blood.  Surgery to remove overactive parathyroid glands.  A procedure that filters your blood to correct calcium levels (hemodialysis). Follow these instructions at home:   Take over-the-counter and prescription medicines only as told by your health care provider.  Follow instructions from your health care provider about eating or drinking restrictions.  Drink enough fluid to keep your urine pale yellow.  Stay active. Weight-bearing exercise helps to keep calcium in your bones. Follow instructions from your health care provider about what type and level of exercise is safe for you.  Keep all follow-up visits as told by your health care provider. This is important. Contact a health care provider if you have:  A fever.  A heartbeat that is irregular or very fast.  Changes in mood, memory, or personality. Get help right away if you:  Have severe abdominal pain.  Have chest pain.  Have trouble breathing.  Become very confused and sleepy.  Lose consciousness. Summary  Hypercalcemia is when the level of calcium in a person's blood is above normal. The body needs calcium to make bones and keep them strong. Calcium also helps the muscles, nerves, brain, and heart work the  way they should.  There are many possible causes of hypercalcemia, and treatment depends on the cause.  Take over-the-counter and prescription medicines only as told by your health care provider.  Follow instructions from your health care provider about eating or drinking  restrictions. This information is not intended to replace advice given to you by your health care provider. Make sure you discuss any questions you have with your health care provider. Document Revised: 04/15/2018 Document Reviewed: 12/23/2017 Elsevier Patient Education  2020 [employer].

## 2019-05-27 ENCOUNTER — Telehealth: Payer: Self-pay

## 2019-05-27 NOTE — Telephone Encounter (Signed)
Notified patient of message from Dr. Elvera Lennox, patient expressed understanding and agreement. No further questions.  Lab appt scheduled.

## 2019-05-27 NOTE — Telephone Encounter (Deleted)
-----   Message from Cristina Gherghe, MD sent at 05/26/2019  1:22 PM EST ----- Lyndsi Altic, can you please call pt: Her vitamin D level and TSH are normal.  Please advise her to double up on her vitamin D to 2000 units daily now that we are stopping her multivitamins.  Approximately 2 weeks after stopping the multivitamins, she needs to come back for labs.  Labs are in.  We will also give her a urine jug at that time for the 24-hour urine collection.  

## 2019-05-27 NOTE — Telephone Encounter (Signed)
-----   Message from Carlus Pavlov, MD sent at 05/26/2019  1:22 PM EST ----- Efraim Kaufmann, can you please call pt: Her vitamin D level and TSH are normal.  Please advise her to double up on her vitamin D to 2000 units daily now that we are stopping her multivitamins.  Approximately 2 weeks after stopping the multivitamins, she needs to come back for labs.  Labs are in.  We will also give her a urine jug at that time for the 24-hour urine collection.

## 2019-06-10 ENCOUNTER — Ambulatory Visit: Payer: 59 | Admitting: Family Medicine

## 2019-06-10 ENCOUNTER — Other Ambulatory Visit: Payer: Self-pay

## 2019-06-10 ENCOUNTER — Other Ambulatory Visit (INDEPENDENT_AMBULATORY_CARE_PROVIDER_SITE_OTHER): Payer: Medicare Other

## 2019-06-10 LAB — PHOSPHORUS: Phosphorus: 3.1 mg/dL (ref 2.3–4.6)

## 2019-06-10 LAB — MAGNESIUM: Magnesium: 1.8 mg/dL (ref 1.5–2.5)

## 2019-06-11 LAB — PARATHYROID HORMONE, INTACT (NO CA): PTH: 29 pg/mL (ref 15–65)

## 2019-06-12 ENCOUNTER — Other Ambulatory Visit: Payer: Medicare Other

## 2019-06-12 ENCOUNTER — Other Ambulatory Visit: Payer: Self-pay

## 2019-06-12 ENCOUNTER — Ambulatory Visit (INDEPENDENT_AMBULATORY_CARE_PROVIDER_SITE_OTHER): Payer: Medicare Other | Admitting: Family Medicine

## 2019-06-12 ENCOUNTER — Encounter: Payer: Self-pay | Admitting: Family Medicine

## 2019-06-12 DIAGNOSIS — M25512 Pain in left shoulder: Secondary | ICD-10-CM

## 2019-06-12 MED ORDER — DICLOFENAC SODIUM 1 % EX GEL: 2.0000 g | Freq: Four times a day (QID) | CUTANEOUS | 1 refills | Status: DC

## 2019-06-12 NOTE — Progress Notes (Signed)
    SUBJECTIVE:   CHIEF COMPLAINT / HPI:   Neck/Shoulder pain Patient presenting for neck pain which radiates to her shoulder. Pain has been present x1 week. Started in neck but now radiates throughout her left shoulder. Thought initially that she slept wrong and did not seek care. States she has a low pain threshold so wanted to be seen. States pain is daily, 24/7. States she is fairly sedentary so is not doing any heavy lifting or pulling/pushing. Denies any trauma to the area. Did ger her 2nd covid vaccine 3 weeks ago in her left arm. Did have a bruise after the injection. Denies edema. Took tylenol and gabapentin (these are chronic meds) which helped a little.   PERTINENT  PMH / PSH: HTN, GERD, DDD, HLD, Nekc pain, overweight   OBJECTIVE:   BP 126/68   Pulse (!) 58   Ht 5\' 4"  (1.626 m)   Wt 184 lb 9.6 oz (83.7 kg)   LMP 04/02/1996   SpO2 98%   BMI 31.69 kg/m   Shoulder: Inspection reveals no obvious deformity, atrophy, or asymmetry. No bruising. No swelling Palpation is normal with no TTP over [facility] joint or bicipital groove. TTP of posterior shoulder  Full ROM in abduction, internal/external rotation. Limited ROM in flexion 2/2 pain  NV intact distally Normal scapular function observed. Special Tests:  - Impingement: Pos Hawkins, neers, empty can sign. - Supraspinatous: Pos empty can.  5/5 strength with resisted flexion at 20 degrees - Infraspinatous/Teres Minor: 5/5 strength with ER - Subscapularis: 5/5 strength with IR - Biceps tendon: Negative Speeds, Yerrgason's  - Labrum: good stability - No painful arc and no drop arm sign    ASSESSMENT/PLAN:   Shoulder pain Patient with left shoulder pain radiating to neck. Differential include muscle strain vs. Osteoarthritis vs. Impingement. Unlikely tear given no h/o trauma or increased activity. Will plan to treat as impingement given positive empty can test, positive neer's sign and positive hawkin's sign. Advised continued  shoulder exercises as well as anti-inflammatory use with voltaren gel. RX provided. Advised to f/u in 2 weeks. If no improvement will consider shoulder injection. Patient agreeable with plan. F/u in 2 weeks, sooner if worsening.      [facility], DO  PGY-3 [facility]

## 2019-06-12 NOTE — Assessment & Plan Note (Signed)
Patient with left shoulder pain radiating to neck. Differential include muscle strain vs. Osteoarthritis vs. Impingement. Unlikely tear given no h/o trauma or increased activity. Will plan to treat as impingement given positive empty can test, positive neer's sign and positive hawkin's sign. Advised continued shoulder exercises as well as anti-inflammatory use with voltaren gel. RX provided. Advised to f/u in 2 weeks. If no improvement will consider shoulder injection. Patient agreeable with plan. F/u in 2 weeks, sooner if worsening.

## 2019-06-12 NOTE — Patient Instructions (Signed)
Shoulder Impingement Syndrome  Shoulder impingement syndrome is a condition that causes pain when connective tissues (tendons) surrounding the shoulder joint become pinched. These tendons are part of the group of muscles and tissues that help to stabilize the shoulder (rotator cuff). Beneath the rotator cuff is a fluid-filled sac (bursa) that allows the muscles and tendons to glide smoothly. The bursa may become swollen or irritated (bursitis). Bursitis, swelling in the rotator cuff tendons, or both conditions can decrease how much space is under a bone in the shoulder joint (acromion), resulting in impingement. What are the causes? Shoulder impingement syndrome may be caused by bursitis or swelling of the rotator cuff tendons, which may result from:  Repetitive overhead arm movements.  Falling onto the shoulder.  Weakness in the shoulder muscles. What increases the risk? You may be more likely to develop this condition if you:  Play sports that involve throwing, such as baseball.  Participate in sports such as tennis, volleyball, and swimming.  Work as a painter, carpenter, or fruit picker. Some people are also more likely to develop impingement syndrome because of the shape of their acromion bone. What are the signs or symptoms? The main symptom of this condition is pain on the front or side of the shoulder. The pain may:  Get worse when lifting or raising the arm.  Get worse at night.  Wake you up from sleeping.  Feel sharp when the shoulder is moved and then fade to an ache. Other symptoms may include:  Tenderness.  Stiffness.  Inability to raise the arm above shoulder level or behind the body.  Weakness. How is this diagnosed? This condition may be diagnosed based on:  Your symptoms and medical history.  A physical exam.  Imaging tests, such as: ? X-rays. ? MRI. ? Ultrasound. How is this treated? This condition may be treated by:  Resting your shoulder and  avoiding all activities that cause pain or put stress on the shoulder.  Icing your shoulder.  NSAIDs to help reduce pain and swelling.  One or more injections of medicines to numb the area and reduce inflammation.  Physical therapy.  Surgery. This may be needed if nonsurgical treatments have not helped. Surgery may involve repairing the rotator cuff, reshaping the acromion, or removing the bursa. Follow these instructions at home: Managing pain, stiffness, and swelling   If directed, put ice on the injured area. ? Put ice in a plastic bag. ? Place a towel between your skin and the bag. ? Leave the ice on for 20 minutes, 2-3 times a day. Activity  Rest and return to your normal activities as told by your health care provider. Ask your health care provider what activities are safe for you.  Do exercises as told by your health care provider. General instructions  Do not use any products that contain nicotine or tobacco, such as cigarettes, e-cigarettes, and chewing tobacco. These can delay healing. If you need help quitting, ask your health care provider.  Ask your health care provider when it is safe for you to drive.  Take over-the-counter and prescription medicines only as told by your health care provider.  Keep all follow-up visits as told by your health care provider. This is important. How is this prevented?  Give your body time to rest between periods of activity.  Be safe and responsible while being active. This will help you avoid falls.  Maintain physical fitness, including strength and flexibility. Contact a health care provider if:  Your   symptoms have not improved after 1-2 months of treatment and rest.  You cannot lift your arm away from your body. Summary  Shoulder impingement syndrome is a condition that causes pain when connective tissues (tendons) surrounding the shoulder joint become pinched.  The main symptom of this condition is pain on the front or  side of the shoulder.  This condition is usually treated with rest, ice, and pain medicines as needed. This information is not intended to replace advice given to you by your health care provider. Make sure you discuss any questions you have with your health care provider. Document Revised: 07/11/2018 Document Reviewed: 09/11/2017 Elsevier Patient Education  2020 [employer]   Shoulder Exercises Ask your health care provider which exercises are safe for you. Do exercises exactly as told by your health care provider and adjust them as directed. It is normal to feel mild stretching, pulling, tightness, or discomfort as you do these exercises. Stop right away if you feel sudden pain or your pain gets worse. Do not begin these exercises until told by your health care provider. Stretching exercises External rotation and abduction This exercise is sometimes called corner stretch. This exercise rotates your arm outward (external rotation) and moves your arm out from your body (abduction). 1. Stand in a doorway with one of your feet slightly in front of the other. This is called a staggered stance. If you cannot reach your forearms to the door frame, stand facing a corner of a room. 2. Choose one of the following positions as told by your health care provider: ? Place your hands and forearms on the door frame above your head. ? Place your hands and forearms on the door frame at the height of your head. ? Place your hands on the door frame at the height of your elbows. 3. Slowly move your weight onto your front foot until you feel a stretch across your chest and in the front of your shoulders. Keep your head and chest upright and keep your abdominal muscles tight. 4. Hold for __________ seconds. 5. To release the stretch, shift your weight to your back foot. Repeat __________ times. Complete this exercise __________ times a day. Extension, standing 1. Stand and hold a broomstick, a cane, or a  similar object behind your back. ? Your hands should be a little wider than shoulder width apart. ? Your palms should face away from your back. 2. Keeping your elbows straight and your shoulder muscles relaxed, move the stick away from your body until you feel a stretch in your shoulders (extension). ? Avoid shrugging your shoulders while you move the stick. Keep your shoulder blades tucked down toward the middle of your back. 3. Hold for __________ seconds. 4. Slowly return to the starting position. Repeat __________ times. Complete this exercise __________ times a day. Range-of-motion exercises Pendulum  1. Stand near a wall or a surface that you can hold onto for balance. 2. Bend at the waist and let your left / right arm hang straight down. Use your other arm to support you. Keep your back straight and do not lock your knees. 3. Relax your left / right arm and shoulder muscles, and move your hips and your trunk so your left / right arm swings freely. Your arm should swing because of the motion of your body, not because you are using your arm or shoulder muscles. 4. Keep moving your hips and trunk so your arm swings in the following directions, as told by   your health care provider: ? Side to side. ? Forward and backward. ? In clockwise and counterclockwise circles. 5. Continue each motion for __________ seconds, or for as long as told by your health care provider. 6. Slowly return to the starting position. Repeat __________ times. Complete this exercise __________ times a day. Shoulder flexion, standing  1. Stand and hold a broomstick, a cane, or a similar object. Place your hands a little more than shoulder width apart on the object. Your left / right hand should be palm up, and your other hand should be palm down. 2. Keep your elbow straight and your shoulder muscles relaxed. Push the stick up with your healthy arm to raise your left / right arm in front of your body, and then over your  head until you feel a stretch in your shoulder (flexion). ? Avoid shrugging your shoulder while you raise your arm. Keep your shoulder blade tucked down toward the middle of your back. 3. Hold for __________ seconds. 4. Slowly return to the starting position. Repeat __________ times. Complete this exercise __________ times a day. Shoulder abduction, standing 1. Stand and hold a broomstick, a cane, or a similar object. Place your hands a little more than shoulder width apart on the object. Your left / right hand should be palm up, and your other hand should be palm down. 2. Keep your elbow straight and your shoulder muscles relaxed. Push the object across your body toward your left / right side. Raise your left / right arm to the side of your body (abduction) until you feel a stretch in your shoulder. ? Do not raise your arm above shoulder height unless your health care provider tells you to do that. ? If directed, raise your arm over your head. ? Avoid shrugging your shoulder while you raise your arm. Keep your shoulder blade tucked down toward the middle of your back. 3. Hold for __________ seconds. 4. Slowly return to the starting position. Repeat __________ times. Complete this exercise __________ times a day. Internal rotation  1. Place your left / right hand behind your back, palm up. 2. Use your other hand to dangle an exercise band, a towel, or a similar object over your shoulder. Grasp the band with your left / right hand so you are holding on to both ends. 3. Gently pull up on the band until you feel a stretch in the front of your left / right shoulder. The movement of your arm toward the center of your body is called internal rotation. ? Avoid shrugging your shoulder while you raise your arm. Keep your shoulder blade tucked down toward the middle of your back. 4. Hold for __________ seconds. 5. Release the stretch by letting go of the band and lowering your hands. Repeat __________  times. Complete this exercise __________ times a day. Strengthening exercises External rotation  1. Sit in a stable chair without armrests. 2. Secure an exercise band to a stable object at elbow height on your left / right side. 3. Place a soft object, such as a folded towel or a small pillow, between your left / right upper arm and your body to move your elbow about 4 inches (10 cm) away from your side. 4. Hold the end of the exercise band so it is tight and there is no slack. 5. Keeping your elbow pressed against the soft object, slowly move your forearm out, away from your abdomen (external rotation). Keep your body steady so only your forearm   moves. 6. Hold for __________ seconds. 7. Slowly return to the starting position. Repeat __________ times. Complete this exercise __________ times a day. Shoulder abduction  1. Sit in a stable chair without armrests, or stand up. 2. Hold a __________ weight in your left / right hand, or hold an exercise band with both hands. 3. Start with your arms straight down and your left / right palm facing in, toward your body. 4. Slowly lift your left / right hand out to your side (abduction). Do not lift your hand above shoulder height unless your health care provider tells you that this is safe. ? Keep your arms straight. ? Avoid shrugging your shoulder while you do this movement. Keep your shoulder blade tucked down toward the middle of your back. 5. Hold for __________ seconds. 6. Slowly lower your arm, and return to the starting position. Repeat __________ times. Complete this exercise __________ times a day. Shoulder extension 1. Sit in a stable chair without armrests, or stand up. 2. Secure an exercise band to a stable object in front of you so it is at shoulder height. 3. Hold one end of the exercise band in each hand. Your palms should face each other. 4. Straighten your elbows and lift your hands up to shoulder height. 5. Step back, away from the  secured end of the exercise band, until the band is tight and there is no slack. 6. Squeeze your shoulder blades together as you pull your hands down to the sides of your thighs (extension). Stop when your hands are straight down by your sides. Do not let your hands go behind your body. 7. Hold for __________ seconds. 8. Slowly return to the starting position. Repeat __________ times. Complete this exercise __________ times a day. Shoulder row 1. Sit in a stable chair without armrests, or stand up. 2. Secure an exercise band to a stable object in front of you so it is at waist height. 3. Hold one end of the exercise band in each hand. Position your palms so that your thumbs are facing the ceiling (neutral position). 4. Bend each of your elbows to a 90-degree angle (right angle) and keep your upper arms at your sides. 5. Step back until the band is tight and there is no slack. 6. Slowly pull your elbows back behind you. 7. Hold for __________ seconds. 8. Slowly return to the starting position. Repeat __________ times. Complete this exercise __________ times a day. Shoulder press-ups  1. Sit in a stable chair that has armrests. Sit upright, with your feet flat on the floor. 2. Put your hands on the armrests so your elbows are bent and your fingers are pointing forward. Your hands should be about even with the sides of your body. 3. Push down on the armrests and use your arms to lift yourself off the chair. Straighten your elbows and lift yourself up as much as you comfortably can. ? Move your shoulder blades down, and avoid letting your shoulders move up toward your ears. ? Keep your feet on the ground. As you get stronger, your feet should support less of your body weight as you lift yourself up. 4. Hold for __________ seconds. 5. Slowly lower yourself back into the chair. Repeat __________ times. Complete this exercise __________ times a day. Wall push-ups  1. Stand so you are facing a  stable wall. Your feet should be about one arm-length away from the wall. 2. Lean forward and place your palms on the wall   at shoulder height. 3. Keep your feet flat on the floor as you bend your elbows and lean forward toward the wall. 4. Hold for __________ seconds. 5. Straighten your elbows to push yourself back to the starting position. Repeat __________ times. Complete this exercise __________ times a day. This information is not intended to replace advice given to you by your health care provider. Make sure you discuss any questions you have with your health care provider. Document Revised: 07/11/2018 Document Reviewed: 04/18/2018 Elsevier Patient Education  2020 [employer]  

## 2019-06-13 LAB — VITAMIN D 1,25 DIHYDROXY
Vitamin D 1, 25 (OH)2 Total: 30 pg/mL (ref 18–72)
Vitamin D2 1, 25 (OH)2: <8 pg/mL
Vitamin D3 1, 25 (OH)2: 30 pg/mL

## 2019-06-13 LAB — CALCIUM, URINE, 24 HOUR: Calcium, 24H Urine: 66 mg/24 h

## 2019-06-13 LAB — CREATININE, URINE, 24 HOUR: Creatinine, 24H Ur: 0.77 g/(24.h) (ref 0.50–2.15)

## 2019-06-15 ENCOUNTER — Other Ambulatory Visit: Payer: Self-pay

## 2019-06-15 ENCOUNTER — Other Ambulatory Visit: Payer: 59

## 2019-06-15 ENCOUNTER — Other Ambulatory Visit (INDEPENDENT_AMBULATORY_CARE_PROVIDER_SITE_OTHER): Payer: Medicare Other

## 2019-06-15 ENCOUNTER — Other Ambulatory Visit: Payer: Self-pay | Admitting: Internal Medicine

## 2019-06-15 LAB — CALCIUM: Calcium: 10.5 mg/dL (ref 8.4–10.5)

## 2019-06-17 ENCOUNTER — Telehealth: Payer: Self-pay

## 2019-06-17 NOTE — Telephone Encounter (Signed)
-----   Message from Carlus Pavlov, MD sent at 06/17/2019 10:00 AM EDT ----- Laura Griffin, can you please call pt:  [name] labs are normal after stopping the multivitamins.  Calcium is 10.5, which is at the upper limit of normal, but still in the normal range. Since labs are normal for now, I would suggest to just repeat them at next visit, in 4 months, but no intervention is needed for now.

## 2019-06-19 NOTE — Telephone Encounter (Signed)
Notified patient of message from Dr. Gherghe, patient expressed understanding and agreement. No further questions.  

## 2019-06-21 ENCOUNTER — Other Ambulatory Visit: Payer: Self-pay | Admitting: Family Medicine

## 2019-06-24 ENCOUNTER — Encounter: Payer: Self-pay | Admitting: Certified Nurse Midwife

## 2019-06-26 ENCOUNTER — Other Ambulatory Visit: Payer: Self-pay

## 2019-06-26 ENCOUNTER — Encounter: Payer: Self-pay | Admitting: Family Medicine

## 2019-06-26 ENCOUNTER — Ambulatory Visit (INDEPENDENT_AMBULATORY_CARE_PROVIDER_SITE_OTHER): Payer: Medicare Other | Admitting: Family Medicine

## 2019-06-26 DIAGNOSIS — M25512 Pain in left shoulder: Secondary | ICD-10-CM | POA: Diagnosis not present

## 2019-06-26 NOTE — Progress Notes (Signed)
    SUBJECTIVE:   CHIEF COMPLAINT / HPI:   Shoulder pain Patient presenting for follow-up of shoulder pain.  Was seen on 3/12 for left shoulder pain.  At that time was recommended to use Voltaren gel as well as shoulder exercises.  Patient reports significant improvement afterwards.  States that she has been doing home exercises and has improved pain as well as range of motion.  Patient did note that recently she has been using her cane more in her left hand which she thinks may have exacerbated the issue.  States that she still sometimes has pain but it is not as bad as it was when she initially saw me.  PERTINENT  PMH / PSH: HTN, GERD, DDD, HLD, neck pain, overweight   OBJECTIVE:   BP (!) 142/88   Pulse (!) 58   Ht 5\' 4"  (1.626 m)   Wt 185 lb (83.9 kg)   LMP 04/02/1996   SpO2 99%   BMI 31.76 kg/m   Shoulder: Inspection reveals no obvious deformity, atrophy, or asymmetry. No bruising. No swelling  Palpation is normal with no TTP over [facility] joint or bicipital groove. Full ROM in flexion, abduction, internal/external rotation NV intact distally Normal scapular function observed. Special Tests:  - Impingement: Neg Hawkins, neers, empty can sign. - Supraspinatous: Negative empty can.  5/5 strength with resisted flexion - Infraspinatous/Teres Minor: 5/5 strength with ER - Subscapularis: 5/5 strength with IR - AC Joint: Negative cross arm - No painful arc and no drop arm sign  ASSESSMENT/PLAN:   Shoulder pain Significant improvement with Voltaren gel as well as shoulder exercises.  Advised to continue shoulder exercises daily to help improve range of motion as well as provide patient daily exercise.  Advised to follow-up as needed if shoulder pain worsens.  Patient she will alternate her cane in her right and her left hand to avoid exacerbation of pain.  Follow-up if no improvement.  Will avoid steroid injection today as she is no longer in pain.  Strict return precautions given.     [facility], DO [facility]

## 2019-06-26 NOTE — Patient Instructions (Signed)
It was a pleasure seeing you today.   Today we discussed your shoulder pain  For your pain: I am so happy to see how well you are improving. Continue the shoulder exercises and voltaren gel as needed. If you feel like the pain is coming back or you want a steroid injection please schedule a follow up visit with me.   Please follow up as needed or sooner if symptoms persist or worsen. Please call the clinic immediately if you have any concerns.   Our clinic's number is [phone]. Please call with questions or concerns.    Thank you,  Oralia Manis, DO

## 2019-06-28 NOTE — Assessment & Plan Note (Signed)
Significant improvement with Voltaren gel as well as shoulder exercises.  Advised to continue shoulder exercises daily to help improve range of motion as well as provide patient daily exercise.  Advised to follow-up as needed if shoulder pain worsens.  Patient she will alternate her cane in her right and her left hand to avoid exacerbation of pain.  Follow-up if no improvement.  Will avoid steroid injection today as she is no longer in pain.  Strict return precautions given.

## 2019-07-27 ENCOUNTER — Other Ambulatory Visit: Payer: Self-pay | Admitting: Family Medicine

## 2019-07-29 ENCOUNTER — Other Ambulatory Visit: Payer: Self-pay

## 2019-07-29 NOTE — Telephone Encounter (Signed)
Patient calling to check the status, patient is completely out.

## 2019-08-28 ENCOUNTER — Encounter: Payer: Self-pay | Admitting: Family Medicine

## 2019-08-28 DIAGNOSIS — Z1231 Encounter for screening mammogram for malignant neoplasm of breast: Secondary | ICD-10-CM | POA: Diagnosis not present

## 2019-09-08 ENCOUNTER — Encounter: Payer: Self-pay | Admitting: Family Medicine

## 2019-09-08 DIAGNOSIS — R921 Mammographic calcification found on diagnostic imaging of breast: Secondary | ICD-10-CM | POA: Diagnosis not present

## 2019-09-16 ENCOUNTER — Encounter: Payer: Self-pay | Admitting: Family Medicine

## 2019-09-16 ENCOUNTER — Other Ambulatory Visit: Payer: Self-pay | Admitting: Radiology

## 2019-09-23 ENCOUNTER — Encounter: Payer: Self-pay | Admitting: Internal Medicine

## 2019-09-23 ENCOUNTER — Ambulatory Visit (INDEPENDENT_AMBULATORY_CARE_PROVIDER_SITE_OTHER): Payer: Medicare Other | Admitting: Internal Medicine

## 2019-09-23 ENCOUNTER — Other Ambulatory Visit: Payer: Self-pay

## 2019-09-23 ENCOUNTER — Other Ambulatory Visit: Payer: Self-pay | Admitting: Internal Medicine

## 2019-09-23 DIAGNOSIS — E041 Nontoxic single thyroid nodule: Secondary | ICD-10-CM

## 2019-09-23 LAB — BASIC METABOLIC PANEL
BUN: 16 mg/dL (ref 6–23)
CO2: 30 mEq/L (ref 19–32)
Calcium: 10.4 mg/dL (ref 8.4–10.5)
Chloride: 101 mEq/L (ref 96–112)
Creatinine, Ser: 1.1 mg/dL (ref 0.40–1.20)
GFR: 58.38 mL/min — ABNORMAL LOW (ref >60.00)
Glucose, Bld: 93 mg/dL (ref 70–99)
Potassium: 3.7 mEq/L (ref 3.5–5.1)
Sodium: 137 mEq/L (ref 135–145)

## 2019-09-23 NOTE — Progress Notes (Signed)
Patient ID: Laura Griffin, female   DOB: 1945-05-16, 74 y.o.   MRN: [id number]   This visit occurred during the SARS-CoV-2 public health emergency.  Safety protocols were in place, including screening questions prior to the visit, additional usage of staff PPE, and extensive cleaning of exam room while observing appropriate contact time as indicated for disinfecting solutions.   HPI  Laura Griffin is a 74 y.o.-year-old female, initially referred by her PCP, Dr. Ardelia Mems, returning for follow-up for hypercalcemia/hyperparathyroidism and thyroid nodule.  She had a benign L breast Bx recently.  Patient was found to have an elevated calcium in approximately 2005 when she was on calcium supplements. She was advised to stop the supplements and calcium normalized since then until 2019, when her calcium started to return elevated again.    At last visit (in 05/2019), she was taking multivitamins with calcium.  We stopped them.  Subsequent calcium and PTH levels were normal.  I reviewed her pertinent labs: Lab Results  Component Value Date   PTH 29 06/10/2019   PTH 41 01/13/2019   PTH Comment 01/13/2019   PTH 44.3 04/25/2011   PTH 23.8 08/28/2007   CALCIUM 10.5 06/15/2019   CALCIUM 10.6 (H) 01/13/2019   CALCIUM 10.9 (H) 12/30/2018   CALCIUM 11.4 (H) 12/22/2018   CALCIUM 11.2 (H) 12/15/2018   CALCIUM 10.3 09/02/2017   CALCIUM 10.5 (H) 07/02/2017   CALCIUM 10.4 09/16/2015   CALCIUM 10.2 05/05/2015   CALCIUM 10.4 08/02/2014   Of note, patient does have a high albumin, however, the corrected calcium level was also slightly high: 12/15/2018: Corrected calcium 10.48 (ULN 10.3).  No history of osteoporosis.  I  reviewed patient's DXA scan report from 2011 and her T-scores were normal.  No fractures or falls.  No history of kidney stones.  24-hour urine calcium (02/08/2019) was normal, with a calcium of 91 mg/24 h, however, a urine creatinine level was not checked at that time, so it is  unclear if this was an appropriate collection.  We checked this again at last visit but unfortunately a BMP was not drawn so we could not calculate the fractional excretion of calcium: Component     Latest Ref Rng & Units 06/12/2019  Creatinine, 24H Ur     0.50 - 2.15 g/24 h 0.77  Calcium, 24H Urine     mg/24 h 66   After stopping her calcium supplement (multivitamin), her calcium, PTH, calcitriol levels were normal, as were her phosphorus and magnesium: Component     Latest Ref Rng & Units 06/10/2019  Vitamin D 1, 25 (OH) Total     18 - 72 pg/mL 30  Vitamin D3 1, 25 (OH)     pg/mL 30  Vitamin D2 1, 25 (OH)     pg/mL <8  PTH, Intact     15 - 65 pg/mL 29  Phosphorus     2.3 - 4.6 mg/dL 3.1  Magnesium     1.5 - 2.5 mg/dL 1.8   Component     Latest Ref Rng & Units 06/15/2019  Calcium     8.4 - 10.5 mg/dL 10.5   No history of CKD.  Latest BUN/Cr: Lab Results  Component Value Date   BUN 13 12/30/2018   BUN 11 12/22/2018   CREATININE 0.94 12/30/2018   CREATININE 0.83 12/22/2018   Not on HCTZ.  She was on this before, stopped in 08-12/2018.  No history of vitamin D deficiency.  Reviewed her vitamin D levels:  Lab Results  Component Value Date   VD25OH 31.79 05/26/2019   At last visit she was on calcium (multivitamins - 300 mg) + 1000 units vitamin D daily.  We stopped the calcium and I advised her to increase her vitamin D daily dose to 2000 units daily. She is now on this dose.  Pt does not have a FH of hypercalcemia, pituitary tumors, thyroid cancer, or osteoporosis.   Pt. also has a history of HTN, OA, GERD (not on PPIs), sciatica.  Her mother died of cerebral hemorrhage in her late 76A as a complication of uncontrolled hypertension.  Thyroid nodule: -Subcentimeter -Without worrisome features  Reviewed thyroid ultrasound from 2017: Right thyroid lobe : 2.6 cm x 0.8 cm x 1.7 cm. Heterogeneous appearance of the right thyroid tissue without significantly increased  flow.  Left thyroid lobe : 3.6 cm x 1.2 cm x 1.3 cm. Heterogeneous appearance of left-sided thyroid tissue without significantly increased flow. Nodule at the inferior left thyroid measures 8 mm x 6 mm x 7 mm with no internal calcifications.  Isthmus Thickness: 5 mm.  No nodules visualized.  Lymphadenopathy: None visualized.  IMPRESSION: Heterogeneous thyroid with single left-sided nodule.  Findings do not meet current SRU consensus criteria for biopsy. Follow-up by clinical exam is recommended. In a patient of this age with no risk factors for thyroid carcinoma, lengthening the surveillance interval beyond 12 months is a reasonable strategy, as thyroid cancers <2cm are known to be indolent with nearly 100% 10-year survival. If the patient has known risk factors for thyroid carcinoma, a follow-up ultrasound in 12 months is recommended.  Pt denies: - feeling nodules in neck - hoarseness - dysphagia - choking - SOB with lying down  No family history of thyroid cancer.  No history of radiation to head or neck.  ROS: Constitutional: no weight gain/no weight loss, no fatigue, no subjective hyperthermia, no subjective hypothermia, + excessive urination and nocturia Eyes: no blurry vision, no xerophthalmia ENT: no sore throat, + see HPI Cardiovascular: no CP/no SOB/no palpitations/no leg swelling Respiratory: no cough/no SOB/no wheezing Gastrointestinal: no N/no V/no D/no C/no acid reflux Musculoskeletal: + muscle aches (L arm)/+ joint aches Skin: no rashes, no hair loss Neurological: no tremors/no numbness/no tingling/no dizziness  I reviewed pt's medications, allergies, PMH, social hx, family hx, and changes were documented in the history of present illness. Otherwise, unchanged from my initial visit note.  Past Medical History:  Diagnosis Date  . Allergic rhinitis 07/19/2014  . Arthritis of knee, right 03/11/2006  . Bursitis of shoulder, right 09/19/1999  . Cervical  radiculopathy at C6 05/24/2011   Beginning Nov 2012. Multi-level foraminal narrowing in C-spine plain films   . Diverticulosis   . DIVERTICULOSIS OF COLON 05/30/2006   Colonoscopy 06/2014 - multiple diverticula, no other abnormalities, no further colonoscopies recommended given patient's age of 26    . GERD (gastroesophageal reflux disease) 07/19/2014   Diagnosed clinically by [facility] in January of 2016.    . Glaucoma   . Hearing decreased 08/07/2011   With whooshing tinnitus Mild bilateral loss on 08/2012 audiology who will recheck her hearing in one year    . History of herniated intervertebral disc   . Hypertension   . HYPERTENSION, BENIGN SYSTEMIC 05/30/2006       . Left sided sciatica 8/95,11/00   CT confirmed   . LOW BACK PAIN SYNDROME 05/10/2009   Qualifier: Diagnosis of  By: Javier Glazier CMA,, [place]  Mild C 3-4, 4-5 lumbar spinal stenosis  MRI 08/2011, Diffuse lower thoracic and lumbar DDD   . Mitral valve prolapse    per patient heart murmur  . Orthostatic hypotension 06/12/2013  . Osteoarthritis   . Osteoarthritis of right knee 12/26/2012   Confirmed on xray 07/2014   . OSTEOARTHRITIS, MULTI SITES 05/30/2006   She is stoic about pain and wants to avoid surgeries Past xrays have documented DJD in right knee and lumbar spine    . Overweight (BMI 25.0-29.9) 05/30/2006  . Pitting edema 07/19/2014   Bilateral 1+ pitting edema of calves first noted on 07/19/2014.    . Sciatica 05/30/2006   Qualifier: Diagnosis of  By: Walker Kehr MD, [place]  Involved left leg in 02/1999   . Swallowing difficulty 07/06/2013  . Urge incontinence 06/01/2008   Qualifier: Diagnosis of  By: Walker Kehr MD, Patrick [place]     Past Surgical History:  Procedure Laterality Date  . CATARACT EXTRACTION  02/2011   left eye  . TUBAL LIGATION  1973  . VULVA /PERINEUM BIOPSY  2010   for hypopigmentation, non-specific result   Social History   Socioeconomic History  . Marital status: Married    Spouse name: Jeneen Rinks  . Number of children:  1  . Years of education: 65  . Highest education level: Not on file  Occupational History  . Occupation: retired    Fish farm manager: [employer]    Comment: career development  Tobacco Use  . Smoking status: Former Smoker    Packs/day: 0.50    Years: 15.00    Pack years: 7.50    Types: Cigarettes    Quit date: 04/02/1994    Years since quitting: 25.4  . Smokeless tobacco: Never Used  Vaping Use  . Vaping Use: Never used  Substance and Sexual Activity  . Alcohol use: No  . Drug use: No  . Sexual activity: Not Currently    Partners: Male    Birth control/protection: Surgical    Comment: BTSP  Other Topics Concern  . Not on file  Social History Narrative   Husband in remission for multiple [place]:    Emergency Contact: husband, Jeneen Rinks, (c) [phone]   End of Life Plan:  Pt reported done, will bring at next office visit 07/2013   Diet: Patient has a varied diet of protein, starch, and vegetables.   Exercise: Pt does not have any regular exercise routine.   Seatbelts: Pt reports wearing seat belt when in vehicle.    Hobbies: crosswords, shopping, reading.       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 Silvano Rusk, RN, BSN  Social Determinants of Health   Financial Resource Strain:   . Difficulty of Paying Living Expenses:   Food Insecurity:   . Worried About Charity fundraiser in the Last Year:   . Arboriculturist in the Last Year:   Transportation Needs:   . Film/video editor (Medical):   [name] Lack of Transportation (Non-Medical):   Physical Activity:   . Days of Exercise per Week:   . Minutes of Exercise per Session:   Stress:   . Feeling of Stress :   Social Connections:   . Frequency of Communication with Friends and Family:   . Frequency of Social Gatherings with Friends and Family:   . Attends Religious Services:   . Active Member of Clubs or Organizations:   . Attends Archivist Meetings:   [name] Marital Status:   Intimate Partner Violence:   . Fear of Current or Ex-Partner:   . Emotionally Abused:   [name] Physically Abused:   . Sexually Abused:    Current Outpatient Medications on File Prior to Visit  Medication Sig Dispense Refill  . acetaminophen (TYLENOL) 650 MG CR tablet Take 2 tablets (1,300 mg total) by mouth every 8 (eight) hours as needed. (Patient taking differently: Take 1,300 mg by mouth 3 (three) times daily. ) 90 tablet 1  . amLODipine (NORVASC) 10 MG tablet TAKE 1 TABLET(10 MG) BY MOUTH AT BEDTIME 90 tablet 1  . aspirin EC 81 MG tablet Take 81 mg by mouth daily.    . brimonidine-timolol (COMBIGAN) 0.2-0.5 % ophthalmic solution Place 2 drops into both eyes every 12 (twelve) hours.      . Cholecalciferol (VITAMIN D3) 1000 UNIT capsule Take 1,000 Units by mouth daily.      . clobetasol ointment (TEMOVATE) 0.05 % Apply thinly to vulva area as instructed twice daily for one week and then once daily for one week and stop 60 g 1  . diclofenac Sodium (VOLTAREN) 1 % GEL Apply 2 g topically 4 (four) times daily.  100 g 1  . gabapentin (NEURONTIN) 400 MG capsule TAKE 2 CAPSULES(800 MG) BY MOUTH THREE TIMES DAILY 540 capsule 1  . latanoprost (XALATAN) 0.005 % ophthalmic solution PLACE 1 DROP INTO BOTH EYES EVERY EVENING/BEDTIME    . metoprolol succinate (TOPROL-XL) 100 MG 24 hr tablet TAKE 1 TABLET BY MOUTH DAILY. TAKE WITH OR IMMEDIATELY FOLLOWING A MEAL 90 tablet 3  . Multiple Vitamin (MULTIVITAMIN) tablet Take 1 tablet by mouth daily.    . Omega-3 Fatty Acids (FISH OIL PO) Take by mouth.     No current facility-administered medications on file prior to visit.   Allergies  Allergen Reactions  . Sulfamethoxazole-Trimethoprim     REACTION: rash: nausea, swelling  . Hctz [Hydrochlorothiazide] Other (See Comments)    Elevated calcium   Family History  Problem Relation Age of Onset  . Breast cancer Sister        17  . Breast cancer Sister 44  . Hypertension Father   . Heart disease Father   . Stroke Mother 34       cerebral hemorrhage  . Hypertension Mother     PE: BP 118/70   Pulse (!) 52   Ht _0  (1.626 m)   Wt 182 lb (82.6 kg)   LMP 04/02/1996   SpO2 99%   BMI 31.24 kg/m  Wt Readings from Last 3 Encounters:  09/23/19 182 lb (82.6 kg)  06/26/19 185 lb (83.9 kg)  06/12/19 184  lb 9.6 oz (83.7 kg)   Constitutional: overweight, in NAD Eyes: PERRLA, EOMI, no exophthalmos ENT: moist mucous membranes, no thyromegaly, no cervical lymphadenopathy Cardiovascular: RRR, No MRG Respiratory: CTA B Gastrointestinal: abdomen soft, NT, ND, BS+ Musculoskeletal: no deformities, strength intact in all 4 Skin: moist, warm, no rashes Neurological: no tremor with outstretched hands, DTR normal in all 4  Assessment: 1. Hypercalcemia/hyperparathyroidism  2.  Thyroid nodule  Plan: Patient has had several instances of elevated calcium, with the highest being 11.4.  An intact PTH level was nonsuppressed, at 41, for a calcium of 10.6. -Patient does not have a known history of vitamin D  deficiency.  At last visit, this was normal. -She has no apparent complications from hypercalcemia: No history of nephrolithiasis, no osteoporosis, no fractures, no abdominal pain, depression, bone pain. -At last visit, we discussed about possible etiologies for her hypercalcemia to include: Medications (she was on HCTZ, but this was stopped), excess use of calcium supplements (we stopped her multivitamin at last visit), primary hyperparathyroidism, familial hypercalcemic hypercalciuria, renal insufficiency, etc. -After we stopped her calcium supplement and made sure that her vitamin D level was normal, I had the patient return for labs and they were all normal: Calcium, PTH, calcitriol, magnesium, phosphorus, and 24-hour urine calcium.  Unfortunately, BMP was not drawn at the time of her blood work so I could not calculate fractional excretion of calcium.  However, my suspicion for [place] is low, since she had numerous instances with normal calcium in the past and also her magnesium level is not elevated. -At this visit, we will recheck calcium level intact PTH ([facility]) vitamin D- 25 HO -We again discussed about possible consequences of hyperparathyroidism: ~1/3 pts will develop complications over 15 years (OP, nephrolithiasis).  -If the tests indicate a parathyroid adenoma, she agrees with a referral to surgery -Reviewed criteria for surgery: . Increased calcium by more than 1 mg/dL above the upper limit of normal  . Kidney ds.  . Osteoporosis (or vertebral fracture) . Age <42 years old Newer criteria (2013): [name] High UCa >400 mg/d and increased stone risk by biochemical stone risk analysis . Presence of nephrolithiasis or nephrocalcinosis . Pt's preference!  -We may need to check a new DXA scan, since last was 10 years ago + add a 33% distal radius for evaluation of cortical bone, which is predominantly affected by hyperparathyroidism.  - I will see the patient back in 6 months or sooner,  depending on the results.  2.  Thyroid nodule -Denies neck compression symptoms -Reviewed her thyroid ultrasound report from 05/23/2015: She has a small T.  Thyroid nodule measuring 8 x 6 x 7 mm -She has no risk factors for thyroid cancer -No follow-up is needed unless nodule appears to be growing tingling  Component     Latest Ref Rng & Units 09/23/2019  Sodium     135 - 145 mEq/L 137  Potassium     3.5 - 5.1 mEq/L 3.7  Chloride     96 - 112 mEq/L 101  CO2     19 - 32 mEq/L 30  Glucose     70 - 99 mg/dL 93  BUN     6 - 23 mg/dL 16  Creatinine     0.40 - 1.20 mg/dL 1.10  GFR     >60.00 mL/min 58.38 (L)  Calcium     8.4 - 10.5 mg/dL 10.4  PTH, Intact     15 - 65 pg/mL 40  Vitamin  D, 25-Hydroxy     30.0 - 100.0 ng/mL 30.8   Labs remain normal, including calcium. No intervention needed for now.  Philemon Kingdom, MD PhD [facility]

## 2019-09-23 NOTE — Patient Instructions (Signed)
Please stop at the lab.  Continue 2000 units vit D daily.  Please come back for a follow-up appointment in 6 months.

## 2019-09-24 LAB — PARATHYROID HORMONE, INTACT (NO CA): PTH: 40 pg/mL (ref 15–65)

## 2019-09-24 LAB — VITAMIN D 25 HYDROXY (VIT D DEFICIENCY, FRACTURES): Vit D, 25-Hydroxy: 30.8 ng/mL (ref 30.0–100.0)

## 2019-10-04 ENCOUNTER — Other Ambulatory Visit: Payer: Self-pay | Admitting: Family Medicine

## 2019-11-23 DIAGNOSIS — H5203 Hypermetropia, bilateral: Secondary | ICD-10-CM | POA: Diagnosis not present

## 2019-11-23 DIAGNOSIS — H401133 Primary open-angle glaucoma, bilateral, severe stage: Secondary | ICD-10-CM | POA: Diagnosis not present

## 2019-11-23 DIAGNOSIS — H52223 Regular astigmatism, bilateral: Secondary | ICD-10-CM | POA: Diagnosis not present

## 2019-11-23 DIAGNOSIS — Z961 Presence of intraocular lens: Secondary | ICD-10-CM | POA: Diagnosis not present

## 2019-11-23 DIAGNOSIS — H524 Presbyopia: Secondary | ICD-10-CM | POA: Diagnosis not present

## 2019-12-21 ENCOUNTER — Other Ambulatory Visit: Payer: Self-pay | Admitting: Family Medicine

## 2019-12-25 ENCOUNTER — Ambulatory Visit: Payer: Medicare Other | Admitting: Certified Nurse Midwife

## 2019-12-30 ENCOUNTER — Ambulatory Visit: Payer: 59

## 2020-01-04 ENCOUNTER — Other Ambulatory Visit: Payer: Self-pay | Admitting: Family Medicine

## 2020-01-08 ENCOUNTER — Other Ambulatory Visit: Payer: Self-pay

## 2020-01-08 ENCOUNTER — Ambulatory Visit (INDEPENDENT_AMBULATORY_CARE_PROVIDER_SITE_OTHER): Payer: Medicare Other

## 2020-01-08 DIAGNOSIS — Z23 Encounter for immunization: Secondary | ICD-10-CM

## 2020-01-08 NOTE — Progress Notes (Signed)
Patient presents to nurse clinic for flu vaccination. Patient denies previous allergic reaction to vaccine or components. Administered in LD, site unremarkable, tolerated injection well.   Veronda Prude, RN

## 2020-01-14 NOTE — Progress Notes (Signed)
74 y.o. G1P1 Married [place] American female here for annual exam.    Has chronic vulvar itching.  No pain.  No prior biopsy of vulva.  Saw Dr. Cruzita Lederer for elevated calcium.   Married for 52 years.  Husband has multiple myeloma and doing well with that. Two grandchildren 44 and 55 yo.   Covid vaccine completed.  Will do her booster.  Flu vaccine one week ago.   PCP:   Delorse Limber. Ardelia Mems, MD  [name] last menstrual period was 04/02/1996.           Sexually active: No.  The current method of family planning is tubal ligation.    Exercising: No.  The patient does not participate in regular exercise at present. Smoker:  no  Health Maintenance: Pap: 12-24-18 Neg:Neg HR HPV, 12-13-15 Neg History of abnormal Pap:  no MMG: 09-16-19 see Epic.  Bx showed calcifications.   Due in June 2022.  Colonoscopy: 06-09-14  BMD: 2011  Result :Normal TDaP:  09-02-17 Gardasil:   no IWL:NLGXQ Hep C: 2016 Neg Screening Labs:  PCP   reports that she quit smoking about 25 years ago. Her smoking use included cigarettes. She has a 7.50 pack-year smoking history. She has never used smokeless tobacco. She reports that she does not drink alcohol and does not use drugs.  Past Medical History:  Diagnosis Date  . Allergic rhinitis 07/19/2014  . Arthritis of knee, right 03/11/2006  . Bursitis of shoulder, right 09/19/1999  . Cervical radiculopathy at C6 05/24/2011   Beginning Nov 2012. Multi-level foraminal narrowing in C-spine plain films   . Diverticulosis   . DIVERTICULOSIS OF COLON 05/30/2006   Colonoscopy 06/2014 - multiple diverticula, no other abnormalities, no further colonoscopies recommended given patient's age of 32    . GERD (gastroesophageal reflux disease) 07/19/2014   Diagnosed clinically by [facility] in January of 2016.    . Glaucoma   . Hearing decreased 08/07/2011   With whooshing tinnitus Mild bilateral loss on 08/2012 audiology who will recheck her hearing in one year    .  History of herniated intervertebral disc   . Hypertension   . HYPERTENSION, BENIGN SYSTEMIC 05/30/2006       . Left sided sciatica 8/95,11/00   CT confirmed   . LOW BACK PAIN SYNDROME 05/10/2009   Qualifier: Diagnosis of  By: Javier Glazier CMA,, Thekla  Mild C 3-4, 4-5 lumbar spinal stenosis MRI 08/2011, Diffuse lower thoracic and lumbar DDD   . Mitral valve prolapse    per patient heart murmur  . Orthostatic hypotension 06/12/2013  . Osteoarthritis   . Osteoarthritis of right knee 12/26/2012   Confirmed on xray 07/2014   . OSTEOARTHRITIS, MULTI SITES 05/30/2006   She is stoic about pain and wants to avoid surgeries Past xrays have documented DJD in right knee and lumbar spine    . Overweight (BMI 25.0-29.9) 05/30/2006  . Pitting edema 07/19/2014   Bilateral 1+ pitting edema of calves first noted on 07/19/2014.    . Sciatica 05/30/2006   Qualifier: Diagnosis of  By: Walker Kehr MD, Wayne  Involved left leg in 02/1999   . Swallowing difficulty 07/06/2013  . Urge incontinence 06/01/2008   Qualifier: Diagnosis of  By: Walker Kehr MD, Patrick Jupiter      Past Surgical History:  Procedure Laterality Date  . CATARACT EXTRACTION  02/2011   left eye  . TUBAL LIGATION  1973  . VULVA /PERINEUM BIOPSY  2010   for hypopigmentation, non-specific result    Current  Outpatient Medications  Medication Sig Dispense Refill  . acetaminophen (TYLENOL) 650 MG CR tablet Take 2 tablets (1,300 mg total) by mouth every 8 (eight) hours as needed. (Patient taking differently: Take 1,300 mg by mouth 3 (three) times daily. ) 90 tablet 1  . amLODipine (NORVASC) 10 MG tablet TAKE 1 TABLET(10 MG) BY MOUTH AT BEDTIME 90 tablet 1  . aspirin EC 81 MG tablet Take 81 mg by mouth daily.    . brimonidine-timolol (COMBIGAN) 0.2-0.5 % ophthalmic solution Place 2 drops into both eyes every 12 (twelve) hours.      . Cholecalciferol (VITAMIN D3) 1000 UNIT capsule Take 1,000 Units by mouth daily.      . diclofenac Sodium (VOLTAREN) 1 % GEL Apply 2 g topically 4  (four) times daily. 100 g 1  . gabapentin (NEURONTIN) 400 MG capsule TAKE 2 CAPSULES(800 MG) BY MOUTH THREE TIMES DAILY 540 capsule 1  . latanoprost (XALATAN) 0.005 % ophthalmic solution PLACE 1 DROP INTO BOTH EYES EVERY EVENING/BEDTIME    . metoprolol succinate (TOPROL-XL) 100 MG 24 hr tablet TAKE 1 TABLET BY MOUTH DAILY WITH OR IMMEDIATELY FOLLOWING A MEAL 90 tablet 1  . Omega-3 Fatty Acids (FISH OIL PO) Take by mouth.    . clobetasol ointment (TEMOVATE) 0.05 % Apply thinly to vulva area as instructed twice daily for one week and then once daily for one week and stop (Patient not taking: Reported on 01/18/2020) 60 g 1   No current facility-administered medications for this visit.    Family History  Problem Relation Age of Onset  . Breast cancer Sister        73  . Breast cancer Sister 61  . Hypertension Father   . Heart disease Father   . Stroke Mother 75       cerebral hemorrhage  . Hypertension Mother   . Breast cancer Maternal Aunt     Review of Systems  Constitutional: Negative.   HENT: Negative.   Eyes: Negative.   Respiratory: Negative.   Cardiovascular: Negative.   Gastrointestinal: Negative.   Endocrine: Negative.   Genitourinary: Negative.   Musculoskeletal: Negative.   Skin: Negative.   Allergic/Immunologic: Negative.   Neurological: Negative.   Hematological: Negative.   Psychiatric/Behavioral: Negative.     Exam:   BP 120/72 (BP Location: Left Arm, Patient Position: Sitting, Cuff Size: Normal)   Pulse (!) 56   Resp 14   Ht _0  (1.6 m)   Wt 175 lb (79.4 kg)   LMP 04/02/1996   BMI 31.00 kg/m     General appearance: alert, cooperative and appears stated age Head: normocephalic, without obvious abnormality, atraumatic Neck: no adenopathy, supple, symmetrical, trachea midline and thyroid normal to inspection and palpation Lungs: clear to auscultation bilaterally Breasts: normal appearance, no masses or tenderness, No nipple retraction or dimpling, No  nipple discharge or bleeding, No axillary adenopathy Heart: regular rate and rhythm Abdomen: soft, non-tender; no masses, no organomegaly Extremities: extremities normal, atraumatic, no cyanosis or edema Skin: skin color, texture, turgor normal. No rashes or lesions Lymph nodes: cervical, supraclavicular, and axillary nodes normal. Neurologic: grossly normal  Pelvic: External genitalia:  Hypopigmentation of the vulva and perianal region with skin breakdown and fissure formation.               No abnormal inguinal nodes palpated.              Urethra:  normal appearing urethra with no masses, tenderness or lesions  Bartholins and Skenes: normal                 Vagina: normal appearing vagina with normal color and discharge, no lesions              Cervix: no lesions              Pap taken: No. Bimanual Exam:  Uterus:  normal size, contour, position, consistency, mobility, non-tender              Adnexa: no mass, fullness, tenderness              Rectal exam: Yes.  .  Confirms.              Anus:  normal sphincter tone, no lesions  Chaperone was present for exam.  Assessment:   Well woman visit with normal exam. Hypopigmentation of the vulva.  Prior dx of lichen sclerosus.  FH breast cancer - two sisters, one paternal aunt.  Hx elevated calcium level.  Normal PTH.   Plan: Mammogram screening discussed. Self breast awareness reviewed. Pap and HR HPV as above. Guidelines for Calcium, Vitamin D, regular exercise program including cardiovascular and weight bearing exercise. Return for vulvar biopsy.  Will wait to refill clobetasol, ok per patient.  Follow up annually and prn.

## 2020-01-18 ENCOUNTER — Other Ambulatory Visit: Payer: Self-pay

## 2020-01-18 ENCOUNTER — Ambulatory Visit (INDEPENDENT_AMBULATORY_CARE_PROVIDER_SITE_OTHER): Payer: Medicare Other | Admitting: Obstetrics and Gynecology

## 2020-01-18 ENCOUNTER — Encounter: Payer: Self-pay | Admitting: Obstetrics and Gynecology

## 2020-01-18 VITALS — BP 120/72 | HR 56 | Resp 14 | Ht 63.0 in | Wt 175.0 lb

## 2020-01-18 DIAGNOSIS — Z01419 Encounter for gynecological examination (general) (routine) without abnormal findings: Secondary | ICD-10-CM | POA: Diagnosis not present

## 2020-01-18 DIAGNOSIS — N9089 Other specified noninflammatory disorders of vulva and perineum: Secondary | ICD-10-CM | POA: Diagnosis not present

## 2020-01-18 NOTE — Patient Instructions (Signed)

## 2020-01-26 ENCOUNTER — Ambulatory Visit: Payer: 59 | Admitting: Family Medicine

## 2020-01-28 ENCOUNTER — Encounter: Payer: Self-pay | Admitting: Family Medicine

## 2020-01-28 ENCOUNTER — Other Ambulatory Visit: Payer: Self-pay

## 2020-01-28 ENCOUNTER — Ambulatory Visit (INDEPENDENT_AMBULATORY_CARE_PROVIDER_SITE_OTHER): Payer: Medicare Other | Admitting: Family Medicine

## 2020-01-28 VITALS — BP 120/80 | HR 48 | Wt 178.8 lb

## 2020-01-28 DIAGNOSIS — M19012 Primary osteoarthritis, left shoulder: Secondary | ICD-10-CM

## 2020-01-28 DIAGNOSIS — R2681 Unsteadiness on feet: Secondary | ICD-10-CM

## 2020-01-28 DIAGNOSIS — M542 Cervicalgia: Secondary | ICD-10-CM

## 2020-01-28 DIAGNOSIS — Z13220 Encounter for screening for lipoid disorders: Secondary | ICD-10-CM

## 2020-01-28 DIAGNOSIS — E785 Hyperlipidemia, unspecified: Secondary | ICD-10-CM | POA: Diagnosis not present

## 2020-01-28 DIAGNOSIS — Z23 Encounter for immunization: Secondary | ICD-10-CM | POA: Diagnosis not present

## 2020-01-28 DIAGNOSIS — I10 Essential (primary) hypertension: Secondary | ICD-10-CM

## 2020-01-28 NOTE — Assessment & Plan Note (Signed)
Pain of L shoulder consistent with arthritic changes. Not interfering with her range of motion or daily activities at this time. No history of imaging. Patient has Voltaren gel at home, but is doing okay without it. Not interested in steroid injection at this time. - No intervention at this time.

## 2020-01-28 NOTE — Progress Notes (Signed)
SUBJECTIVE:   CHIEF COMPLAINT / HPI:  Check-up  PERTINENT  PMH / PSH:   Trouble walking: Ms. Eastmond reports that for the past several weeks she has had the sensation of "tripping over something" with her feet "but nothing is there." She denies any sensation of disequilibrium, vertigo, lightheadedness, or dizziness. She has not fallen, but is concerned for falls. She is a long-time cane user and tries to alternate which hand she holds her cane in. She thinks that her longstanding knee arthritis may be contributing as she sometimes "feels like my knee is going to give out." She had physical therapy several years ago and felt that it was helpful. Is amenable to trying PT again.   Hypertension - currently taking toprol XL 100mg  daily, amlodipine 10mg  daily. Tolerating well.  Osteoarthritis of L shoulder: She reports that her L shoulder pain is unchanged from previous. It responded well to Voltaren gel, but she has not been using this recently. It does not affect her range of motion. She reports pain that "comes and goes" but does not interfere with her day-to-day activities. She is not interested in a steroid injection at this time; feels she can manage it on her own. She does take gabapentin for chronic neck radicular pain, works well.  Hyperlipidemia: Takes fish oil daily. Has not had lipid panel in several years.   Vit D Levels:  She reports that her endocrinologist doubled her daily Vit D supplement in June. Vit D level was 30.8 at that time. She is interested in getting this rechecked today.   Health maintenance: Interested in COVID booster today.  She is in need for her Medicare annual wellness exam and would like to schedule that today.   OBJECTIVE:   BP 120/80   Pulse (!) 48   Wt 178 lb 12.8 oz (81.1 kg)   LMP 04/02/1996   SpO2 98%   BMI 31.67 kg/m   Physical Exam Vitals reviewed.  Constitutional:      General: She is not in acute distress. Cardiovascular:     Rate  and Rhythm: regular rate and rhythm      Heart sounds: Normal heart sounds. No murmur heard.  No friction rub. No gallop.   Pulmonary:     Effort: Pulmonary effort is normal.     Breath sounds: Normal breath sounds.  Musculoskeletal:     Cervical back: Neck supple.     Right lower leg: No edema.     Left lower leg: No edema.  Full ROM of L shoulder with active movement Lymphadenopathy:     Cervical: No cervical adenopathy.  Neurological:     Mental Status: She is alert.     Comments: Strength 5/5 throughout LE bilaterally.  Gait symmetrical, slightly slow/tentative. Psychiatric:        Mood and Affect: Mood normal.        Thought Content: Thought content normal.        Judgment: Judgment normal.     ASSESSMENT/PLAN:   Gait instability Uncertain etiology. No evidence of neurological weakness or cerebellar ataxia on exam. Strength and sensation intact throughout. No history of falls or near falls, but walks with significant caution. She would likely benefit from PT for strength and balance.  - Referral to PT   Osteoarthritis Pain of L shoulder consistent with arthritic changes. Not interfering with her range of motion or daily activities at this time. No history of imaging. Patient has Voltaren gel at home, but is  doing okay without it. Not interested in steroid injection at this time. - No intervention at this time.   Hyperlipidemia - Lipid panel today  Neck pain Doing well on gabapentin, continue this medication    Vit D Level: - Vitamin D 25 today per patient request   Health Maintenance:  - COVID booster administered in clinic - Patient to schedule Medicare annual exam   Follow up in 6 months, sooner if needed  Dorothyann Gibbs, Medical Student [facility]   Patient seen along with MS3 student Dorothyann Gibbs. I personally evaluated this patient along with the student, and verified all aspects of the history, physical exam, and medical  decision making as documented by the student. I agree with the student's documentation and have made all necessary edits.  Levert Feinstein, MD  [facility]

## 2020-01-28 NOTE — Patient Instructions (Signed)
Checking vit D and cholesterol today Referring to physical therapy Schedule your Annual Wellness Visit   COVID booster today  Follow up with me in 6 months, sooner if needed  Be well, Dr. Pollie Meyer

## 2020-01-28 NOTE — Progress Notes (Signed)
   Covid-19 Vaccination Clinic  Name:  SIDNEE GAMBRILL    MRN: [id number] DOB: 1945-10-29  01/28/2020  Ms. Mroz was observed post Covid-19 immunization for 15 minutes without incident. She was provided with Vaccine Information Sheet and instruction to access the V-Safe system.   Ms. Deleon was instructed to call 911 with any severe reactions post vaccine: [name] Difficulty breathing  . Swelling of face and throat  . A fast heartbeat  . A bad rash all over body  . Dizziness and weakness

## 2020-01-28 NOTE — Assessment & Plan Note (Signed)
Lipid panel today

## 2020-01-29 LAB — LIPID PANEL
Chol/HDL Ratio: 3.7 ratio (ref 0.0–4.4)
Cholesterol, Total: 194 mg/dL (ref 100–199)
HDL: 52 mg/dL (ref >39)
LDL Chol Calc (NIH): 121 mg/dL — ABNORMAL HIGH (ref 0–99)
Triglycerides: 118 mg/dL (ref 0–149)
VLDL Cholesterol Cal: 21 mg/dL (ref 5–40)

## 2020-01-29 LAB — VITAMIN D 25 HYDROXY (VIT D DEFICIENCY, FRACTURES): Vit D, 25-Hydroxy: 37.4 ng/mL (ref 30.0–100.0)

## 2020-01-29 NOTE — Assessment & Plan Note (Signed)
Well controlled. Continue current medication regimen.  

## 2020-01-29 NOTE — Assessment & Plan Note (Signed)
Doing well on gabapentin, continue this medication

## 2020-02-02 ENCOUNTER — Telehealth: Payer: Self-pay

## 2020-02-02 DIAGNOSIS — N9089 Other specified noninflammatory disorders of vulva and perineum: Secondary | ICD-10-CM

## 2020-02-02 NOTE — Telephone Encounter (Signed)
Spoke with pt. Pt calling to schedule vulvar bx per Dr Edward Jolly from 01/18/20 AEX visit. Pt scheduled for 11/9 at 1130 am with Dr Edward Jolly. Pt agreeable and verbalized understanding to date and time of appt. Pt aware of PR $0, will file first.  Pt thankful for returned call and scheduling procedure. Pt advised can take Motrin 800 mg with food and water one hour before procedure. Pt agreeable.  Cc: Hayley for update  Encounter closed Orders placed. Appt Linked

## 2020-02-02 NOTE — Telephone Encounter (Signed)
Patient wants to speak with nurse regarding last appointment.

## 2020-02-08 ENCOUNTER — Other Ambulatory Visit: Payer: Self-pay | Admitting: Family Medicine

## 2020-02-08 NOTE — Progress Notes (Signed)
GYNECOLOGY  VISIT   HPI: 74 y.o.   Married  [place] American  female   G1P1 with [name] last menstrual period was 04/02/1996.   here for vulvar biopsy.  Has hypopigmentation of the vulva.  Has treated in the past with Clobetasol, not used for several months.   GYNECOLOGIC HISTORY: [name] last menstrual period was 04/02/1996. Contraception:  tubal Menopausal hormone therapy:  none Last mammogram:  09-16-19 see Epic.  Bx showed calcifications.   Due in June 2022.  Last pap smear: 12-24-18 Neg:Neg HR HPV, 12-13-15 Neg        OB History    Gravida  1   Para  1   Term      Preterm      AB      Living  1     SAB      TAB      Ectopic      Multiple      Live Births  1              Patient Active Problem List   Diagnosis Date Noted  . Ganglion cyst of dorsum of right wrist 12/15/2018  . Lichen sclerosus of female genitalia 09/25/2018    Class: Chronic  . Hyperlipidemia 03/17/2018  . Lichen planus 21/19/4174  . Numbness and tingling of right leg 10/19/2016  . Shoulder pain 03/01/2016  . Headache 03/01/2016  . Asymmetric SNHL (sensorineural hearing loss) 01/10/2016  . Tinnitus of both ears 01/10/2016  . Hearing loss 10/07/2015  . Thyroid nodule 05/24/2015  . Globus sensation 05/05/2015  . Neck pain 01/07/2015  . Allergic rhinitis 07/19/2014  . GERD (gastroesophageal reflux disease) 07/19/2014  . Pitting edema 07/19/2014  . Swallowing difficulty 07/06/2013  . Osteoarthritis of right knee 12/26/2012  . GLAUCOMA 11/04/2009  . DDD (degenerative disc disease), lumbar 05/10/2009  . URGE INCONTINENCE 06/01/2008  . Overweight (BMI 25.0-29.9) 05/30/2006  . HYPERTENSION, BENIGN SYSTEMIC 05/30/2006  . Osteoarthritis 05/30/2006    Past Medical History:  Diagnosis Date  . Allergic rhinitis 07/19/2014  . Arthritis of knee, right 03/11/2006  . Bursitis of shoulder, right 09/19/1999  . Cervical radiculopathy at C6 05/24/2011   Beginning Nov 2012. Multi-level  foraminal narrowing in C-spine plain films   . Diverticulosis   . DIVERTICULOSIS OF COLON 05/30/2006   Colonoscopy 06/2014 - multiple diverticula, no other abnormalities, no further colonoscopies recommended given patient's age of 74    . GERD (gastroesophageal reflux disease) 07/19/2014   Diagnosed clinically by [facility] in January of 2016.    . Glaucoma   . Hearing decreased 08/07/2011   With whooshing tinnitus Mild bilateral loss on 08/2012 audiology who will recheck her hearing in one year    . History of herniated intervertebral disc   . Hypertension   . HYPERTENSION, BENIGN SYSTEMIC 05/30/2006       . Left sided sciatica 8/95,11/00   CT confirmed   . LOW BACK PAIN SYNDROME 05/10/2009   Qualifier: Diagnosis of  By: Javier Glazier CMA,, Thekla  Mild C 3-4, 4-5 lumbar spinal stenosis MRI 08/2011, Diffuse lower thoracic and lumbar DDD   . Mitral valve prolapse    per patient heart murmur  . Orthostatic hypotension 06/12/2013  . Osteoarthritis   . Osteoarthritis of right knee 12/26/2012   Confirmed on xray 07/2014   . OSTEOARTHRITIS, MULTI SITES 05/30/2006   She is stoic about pain and wants to avoid surgeries Past xrays have documented DJD in right knee and lumbar  spine    . Overweight (BMI 25.0-29.9) 05/30/2006  . Pitting edema 07/19/2014   Bilateral 1+ pitting edema of calves first noted on 07/19/2014.    . Sciatica 05/30/2006   Qualifier: Diagnosis of  By: Walker Kehr MD, Wayne  Involved left leg in 02/1999   . Swallowing difficulty 07/06/2013  . Urge incontinence 06/01/2008   Qualifier: Diagnosis of  By: Walker Kehr MD, Patrick Jupiter      Past Surgical History:  Procedure Laterality Date  . CATARACT EXTRACTION  02/2011   left eye  . TUBAL LIGATION  1973  . VULVA /PERINEUM BIOPSY  2010   for hypopigmentation, non-specific result    Current Outpatient Medications  Medication Sig Dispense Refill  . acetaminophen (TYLENOL) 650 MG CR tablet Take 2 tablets (1,300 mg total) by mouth every 8 (eight) hours as  needed. (Patient taking differently: Take 1,300 mg by mouth 3 (three) times daily. ) 90 tablet 1  . amLODipine (NORVASC) 10 MG tablet TAKE 1 TABLET(10 MG) BY MOUTH AT BEDTIME 90 tablet 1  . aspirin EC 81 MG tablet Take 81 mg by mouth daily.    . brimonidine-timolol (COMBIGAN) 0.2-0.5 % ophthalmic solution Place 2 drops into both eyes every 12 (twelve) hours.      . Cholecalciferol (VITAMIN D3) 1000 UNIT capsule Take 1,000 Units by mouth daily.      [name] gabapentin (NEURONTIN) 400 MG capsule TAKE 2 CAPSULES(800 MG) BY MOUTH THREE TIMES DAILY 540 capsule 1  . latanoprost (XALATAN) 0.005 % ophthalmic solution PLACE 1 DROP INTO BOTH EYES EVERY EVENING/BEDTIME    . metoprolol succinate (TOPROL-XL) 100 MG 24 hr tablet TAKE 1 TABLET BY MOUTH DAILY WITH OR IMMEDIATELY FOLLOWING A MEAL 90 tablet 1  . Omega-3 Fatty Acids (FISH OIL PO) Take by mouth.    . clobetasol ointment (TEMOVATE) 0.05 % Apply thinly to vulva area as instructed twice daily for one week and then once daily for one week and stop (Patient not taking: Reported on 02/09/2020) 60 g 1   No current facility-administered medications for this visit.     ALLERGIES: Sulfamethoxazole-trimethoprim and Hctz [hydrochlorothiazide]  Family History  Problem Relation Age of Onset  . Breast cancer Sister        14  . Breast cancer Sister 42  . Hypertension Father   . Heart disease Father   . Stroke Mother 56       cerebral hemorrhage  . Hypertension Mother   . Breast cancer Maternal Aunt     Social History   Socioeconomic History  . Marital status: Married    Spouse name: Jeneen Rinks  . Number of children: 1  . Years of education: 72  . Highest education level: Not on file  Occupational History  . Occupation: retired    Fish farm manager: [employer]    Comment: career development  Tobacco Use  . Smoking status: Former Smoker    Packs/day: 0.50    Years: 15.00    Pack years: 7.50    Types: Cigarettes    Quit date: 04/02/1994    Years since  quitting: 25.8  . Smokeless tobacco: Never Used  Vaping Use  . Vaping Use: Never used  Substance and Sexual Activity  . Alcohol use: No  . Drug use: No  . Sexual activity: Not Currently    Partners: Male    Birth control/protection: Surgical    Comment: BTSP  Other Topics Concern  . Not on file  Social History Narrative   Husband in remission  for multiple myeloma      Health Care POA:    Emergency Contact: husband, Jeneen Rinks, (c) [phone]   End of Life Plan:  Pt reported done, will bring at next office visit 07/2013   Diet: Patient has a varied diet of protein, starch, and vegetables.   Exercise: Pt does not have any regular exercise routine.   Seatbelts: Pt reports wearing seat belt when in vehicle.    Hobbies: crosswords, shopping, reading.       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 Ducatte, RN, BSN                                                                                                          Social Determinants of Health   Financial Resource Strain:   . Difficulty of Paying Living Expenses: Not on file  Food Insecurity:   . Worried About Charity fundraiser in  the Last Year: Not on file  . Ran Out of Food in the Last Year: Not on file  Transportation Needs:   . Lack of Transportation (Medical): Not on file  . Lack of Transportation (Non-Medical): Not on file  Physical Activity:   . Days of Exercise per Week: Not on file  . Minutes of Exercise per Session: Not on file  Stress:   . Feeling of Stress : Not on file  Social Connections:   . Frequency of Communication with Friends and Family: Not on file  . Frequency of Social Gatherings with Friends and Family: Not on file  . Attends Religious Services: Not on file  . Active Member of Clubs or Organizations: Not on file  . Attends Archivist Meetings: Not on file  . Marital Status: Not on file  Intimate Partner Violence:   . Fear of Current or Ex-Partner: Not on file  . Emotionally Abused: Not on file  . Physically Abused: Not on file  . Sexually Abused: Not on file    Review of Systems  Constitutional: Negative.   HENT: Negative.   Eyes: Negative.   Respiratory: Negative.   Cardiovascular: Negative.   Gastrointestinal: Negative.   Endocrine: Negative.   Genitourinary: Negative.   Musculoskeletal: Negative.  Skin: Negative.   Allergic/Immunologic: Negative.   Neurological: Negative.   Hematological: Negative.   Psychiatric/Behavioral: Negative.     PHYSICAL EXAMINATION:    BP (!) 148/90 (BP Location: Right Arm, Patient Position: Sitting, Cuff Size: Normal)   Pulse 80   Resp 16   Ht _0  (1.6 m)   Wt 174 lb (78.9 kg)   LMP 04/02/1996   BMI 30.82 kg/m     General appearance: alert, cooperative and appears stated age   Pelvic: External genitalia:  no lesions.  Hypopigmentation of vulva and perianal region.  Skin ulceration also noted in the right labia minora just adjacent tot he clitoris.   Biopsy of right labia minora. Consent for procedure.  Sterile prep.  Local 1% lidocaine, lot [id number], exp 5/24. 4 mm punch biopsy used.  Tissue to pathology.  Silver  nitrate used.  Single suture of 3/0 Vicryl.  Minimal EBL.  No complications.              Chaperone was present for exam.  ASSESSMENT  Vulvar lesion.  Suspected lichen sclerosus.   PLAN  FU biopsy.  Post biopsy precautions.  Anticipate continued Clobetasol ointment treatment.

## 2020-02-09 ENCOUNTER — Other Ambulatory Visit (HOSPITAL_COMMUNITY)
Admission: RE | Admit: 2020-02-09 | Discharge: 2020-02-09 | Disposition: A | Discharge status: Home / Self Care | Payer: Medicare Other | Source: Ambulatory Visit | Attending: Obstetrics and Gynecology | Admitting: Obstetrics and Gynecology

## 2020-02-09 ENCOUNTER — Ambulatory Visit (INDEPENDENT_AMBULATORY_CARE_PROVIDER_SITE_OTHER): Payer: Medicare Other | Admitting: Obstetrics and Gynecology

## 2020-02-09 ENCOUNTER — Encounter: Payer: Self-pay | Admitting: Obstetrics and Gynecology

## 2020-02-09 ENCOUNTER — Other Ambulatory Visit: Payer: Self-pay

## 2020-02-09 DIAGNOSIS — N904 Leukoplakia of vulva: Secondary | ICD-10-CM | POA: Diagnosis not present

## 2020-02-09 DIAGNOSIS — N9089 Other specified noninflammatory disorders of vulva and perineum: Secondary | ICD-10-CM

## 2020-02-09 NOTE — Patient Instructions (Signed)
Vulva Biopsy, Care After This sheet gives you information about how to care for yourself after your procedure. Your doctor may also give you more specific instructions. If you have problems or questions, contact your doctor. What can I expect after the procedure? After the procedure, it is common to have:  Slight bleeding from the biopsy site.  Soreness at the biopsy site. Follow these instructions at home: Biopsy site care   Follow instructions from your doctor about how to take care of your biopsy site. Make sure you: ? Clean the area using water and mild soap two times a day or as told by your doctor. Gently pat the area dry. ? If you were prescribed an antibiotic ointment, apply it as told by your doctor. Do not stop using the antibiotic even if your condition gets better. ? Take a warm water bath (sitz bath) as needed to help with pain. A sitz bath is taken while you are sitting down. The water should only come up to your hips and cover your butt. ? Leave stitches (sutures), skin glue, or skin tape (adhesive) strips in place. They may need to stay in place for 2 weeks or longer. If tape strips get loose and curl up, you may trim the loose edges. Do not remove tape strips completely unless your doctor says it is okay. ? Check your biopsy area every day for signs of infection. Check for:  More redness, swelling, or pain.  More fluid or blood.  Warmth.  Pus or a bad smell. ? Do not rub the biopsy area after peeing (urinating).  Gently pat the area dry, or use a bottle filled with warm water (peri-bottle) to clean the area.  Gently wipe from front to back. Lifestyle  Wear loose, cotton underwear.  Do not wear tight pants.  Do not use a tampon, douche, or put anything in your vagina for at least 1 week or until your doctor says it is okay.  Do not have sex for at least 1 week or until your doctor says it is okay.  Do not exercise until your doctor says it is okay.  Do not  swim or use a hot tub until your doctor says it is okay. You may shower or take a sitz bath. General instructions  Take over-the-counter and prescription medicines only as told by your doctor.  Use a sanitary pad until the bleeding stops.  Keep all follow-up visits as told by your doctor. This is important. Contact a doctor if:  You have more redness, swelling, or pain around your biopsy site.  You have more fluid or blood coming from your biopsy site.  Your biopsy site feels warm when you touch it.  Medicines do not help with your pain. Get help right away if:  You have a lot of bleeding from the vulva.  You have pus or a bad smell coming from your biopsy site.  You have a fever.  You have pain in the lower belly (abdomen). Summary  After the procedure, it is common to have slight bleeding and soreness at the biopsy site.  Follow all instructions as told by your doctor. Clean the area with water and mild soap. Do not rub. Pat the area dry.  Take sitz baths as needed. Leave any stitches in place.  Check your biopsy site for infection. Signs include more redness, swelling, pain, fluid, or blood, or feeling warm when you touch it.  Get help right away if you have a   lot of bleeding, a fever, pus or a bad smell, or pain in your lower belly. This information is not intended to replace advice given to you by your health care provider. Make sure you discuss any questions you have with your health care provider. Document Revised: 09/19/2017 Document Reviewed: 09/19/2017 Elsevier Patient Education  2020 Elsevier Inc.  

## 2020-02-12 ENCOUNTER — Other Ambulatory Visit: Payer: Self-pay

## 2020-02-12 LAB — SURGICAL PATHOLOGY

## 2020-02-17 ENCOUNTER — Ambulatory Visit (INDEPENDENT_AMBULATORY_CARE_PROVIDER_SITE_OTHER): Payer: Medicare Other

## 2020-02-17 ENCOUNTER — Other Ambulatory Visit: Payer: Self-pay

## 2020-02-17 VITALS — Ht 64.5 in | Wt 174.0 lb

## 2020-02-17 DIAGNOSIS — Z0000 Encounter for general adult medical examination without abnormal findings: Secondary | ICD-10-CM | POA: Diagnosis not present

## 2020-02-17 NOTE — Progress Notes (Signed)
Subjective:   Laura Griffin is a 74 y.o. female who presents for Medicare Annual (Subsequent) preventive examination.  Review of Systems: Defer to PCP.  Objective:   Vitals: Ht 5' 4.5" (1.638 m)   Wt 174 lb (78.9 kg)   LMP 04/02/1996   BMI 29.41 kg/m   Body mass index is 29.41 kg/m.  Advanced Directives 02/17/2020 01/28/2020 12/15/2018 01/17/2018 07/02/2017 04/19/2017 01/23/2017  Does Patient Have a Medical Advance Directive? Yes No No No No No Yes  Type of Paramedic of [place];Living will - - - - - Living will  Does patient want to make changes to medical advance directive? - - - - - - No - Patient declined  Copy of [place] in Chart? No - copy requested - - - - - -  Would patient like information on creating a medical advance directive? No - Patient declined No - Patient declined No - Patient declined No - Patient declined No - Patient declined No - Patient declined No - Patient declined   Tobacco Social History   Tobacco Use  Smoking Status Former Smoker  . Packs/day: 0.50  . Years: 15.00  . Pack years: 7.50  . Types: Cigarettes  . Quit date: 04/02/1994  . Years since quitting: 25.8  Smokeless Tobacco Never Used     Clinical Intake:  Pre-visit preparation completed: Yes  Pain Score: 3   How often do you need to have someone help you when you read instructions, pamphlets, or other written materials from your doctor or pharmacy?: 1 - Never What is the last grade level you completed in school?: masters degree  Interpreter Needed?: No   Past Medical History:  Diagnosis Date  . Allergic rhinitis 07/19/2014  . Arthritis of knee, right 03/11/2006  . Bursitis of shoulder, right 09/19/1999  . Cervical radiculopathy at C6 05/24/2011   Beginning Nov 2012. Multi-level foraminal narrowing in C-spine plain films   . Diverticulosis   . DIVERTICULOSIS OF COLON 05/30/2006   Colonoscopy 06/2014 - multiple diverticula, no other  abnormalities, no further colonoscopies recommended given patient's age of 1    . GERD (gastroesophageal reflux disease) 07/19/2014   Diagnosed clinically by [facility] in January of 2016.    . Glaucoma   . Hearing decreased 08/07/2011   With whooshing tinnitus Mild bilateral loss on 08/2012 audiology who will recheck her hearing in one year    . History of herniated intervertebral disc   . Hypertension   . HYPERTENSION, BENIGN SYSTEMIC 05/30/2006       . Left sided sciatica 8/95,11/00   CT confirmed   . LOW BACK PAIN SYNDROME 05/10/2009   Qualifier: Diagnosis of  By: Javier Glazier CMA,, [place]  Mild C 3-4, 4-5 lumbar spinal stenosis MRI 08/2011, Diffuse lower thoracic and lumbar DDD   . Mitral valve prolapse    per patient heart murmur  . Orthostatic hypotension 06/12/2013  . Osteoarthritis   . Osteoarthritis of right knee 12/26/2012   Confirmed on xray 07/2014   . OSTEOARTHRITIS, MULTI SITES 05/30/2006   She is stoic about pain and wants to avoid surgeries Past xrays have documented DJD in right knee and lumbar spine    . Overweight (BMI 25.0-29.9) 05/30/2006  . Pitting edema 07/19/2014   Bilateral 1+ pitting edema of calves first noted on 07/19/2014.    . Sciatica 05/30/2006   Qualifier: Diagnosis of  By: Walker Kehr MD, [place]  Involved left leg in 02/1999   .  Swallowing difficulty 07/06/2013  . Urge incontinence 06/01/2008   Qualifier: Diagnosis of  By: Walker Kehr MD, Patrick [place]     Past Surgical History:  Procedure Laterality Date  . CATARACT EXTRACTION  02/2011   left eye  . TUBAL LIGATION  1973  . VULVA /PERINEUM BIOPSY  2010   for hypopigmentation, non-specific result   Family History  Problem Relation Age of Onset  . Breast cancer Sister        36  . Breast cancer Sister 9  . Hypertension Father   . Heart disease Father   . Stroke Mother 8       cerebral hemorrhage  . Hypertension Mother   . Breast cancer Maternal Aunt    Social History   Socioeconomic History  . Marital status:  Married    Spouse name: Jeneen Rinks  . Number of children: 1  . Years of education: 35  . Highest education level: Master's degree (e.g., MA, MS, MEng, MEd, MSW, MBA)  Occupational History  . Occupation: retired    Fish farm manager: [employer]    Comment: career development  Tobacco Use  . Smoking status: Former Smoker    Packs/day: 0.50    Years: 15.00    Pack years: 7.50    Types: Cigarettes    Quit date: 04/02/1994    Years since quitting: 25.8  . Smokeless tobacco: Never Used  Vaping Use  . Vaping Use: Never used  Substance and Sexual Activity  . Alcohol use: No  . Drug use: No  . Sexual activity: Not Currently    Partners: Male    Birth control/protection: Surgical    Comment: BTSP  Other Topics Concern  . Not on file  Social History Narrative   Patient lives with husband in [place].    Patient has one son and two grandchildren who live nearby.   Patient enjoys seeing her grandchildren grow up and watching their sporting events.    Patient enjoys reading and her book club, crossword puzzles, and shopping.   Husband in remission for multiple myeloma and doing well.          Social Determinants of Health   Financial Resource Strain: Low Risk   . Difficulty of Paying Living Expenses: Not hard at all  Food Insecurity: No Food Insecurity  . Worried About Charity fundraiser in the Last Year: Never true  . Ran Out of Food in the Last Year: Never true  Transportation Needs: No Transportation Needs  . Lack of Transportation (Medical): No  . Lack of Transportation (Non-Medical): No  Physical Activity: Insufficiently Active  . Days of Exercise per Week: 1 day  . Minutes of Exercise per Session: 20 min  Stress: No Stress Concern Present  . Feeling of Stress : Not at all  Social Connections: Moderately Integrated  . Frequency of Communication with Friends and Family: More than three times a week  . Frequency of Social Gatherings with Friends and Family: More than three times  a week  . Attends Religious Services: Never  . Active Member of Clubs or Organizations: Yes  . Attends Archivist Meetings: More than 4 times per year  . Marital Status: Married   Outpatient Encounter Medications as of 02/17/2020  Medication Sig  . acetaminophen (TYLENOL) 650 MG CR tablet Take 2 tablets (1,300 mg total) by mouth every 8 (eight) hours as needed. (Patient taking differently: Take 1,300 mg by mouth 3 (three) times daily. )  . amLODipine (NORVASC) 10 MG  tablet TAKE 1 TABLET(10 MG) BY MOUTH AT BEDTIME  . aspirin EC 81 MG tablet Take 81 mg by mouth daily.  . brimonidine-timolol (COMBIGAN) 0.2-0.5 % ophthalmic solution Place 2 drops into both eyes every 12 (twelve) hours.    . Cholecalciferol (VITAMIN D3) 1000 UNIT capsule Take 1,000 Units by mouth daily.    [name] gabapentin (NEURONTIN) 400 MG capsule TAKE 2 CAPSULES(800 MG) BY MOUTH THREE TIMES DAILY  . latanoprost (XALATAN) 0.005 % ophthalmic solution PLACE 1 DROP INTO BOTH EYES EVERY EVENING/BEDTIME  . metoprolol succinate (TOPROL-XL) 100 MG 24 hr tablet TAKE 1 TABLET BY MOUTH DAILY WITH OR IMMEDIATELY FOLLOWING A MEAL  . Omega-3 Fatty Acids (FISH OIL PO) Take by mouth.  . clobetasol ointment (TEMOVATE) 0.05 % Apply thinly to vulva area as instructed twice daily for one week and then once daily for one week and stop (Patient not taking: Reported on 02/09/2020)   No facility-administered encounter medications on file as of 02/17/2020.   Activities of Daily Living In your present state of health, do you have any difficulty performing the following activities: 02/17/2020  Hearing? Y  Comment sees audiologist  Vision? N  Difficulty concentrating or making decisions? N  Walking or climbing stairs? N  Dressing or bathing? N  Doing errands, shopping? N  Preparing Food and eating ? N  Using the Toilet? N  In the past six months, have you accidently leaked urine? Y  Do you have problems with loss of bowel control? N    Managing your Medications? N  Managing your Finances? N  Housekeeping or managing your Housekeeping? N  Some recent data might be hidden   Patient Care Team: Leeanne Rio, MD as PCP - General (Family Medicine) Lorelle Gibbs, MD (Radiology) Webb Laws, [place] (Optometry) Knox Saliva, DDS (Dentistry) Ronald Lobo, MD as Consulting Physician (Gastroenterology) Megan Salon, MD as Consulting Physician (Gynecology) Regina Eck, CNM as Referring Physician (Certified Nurse Midwife)    Assessment:   This is a routine wellness examination for [place].  Exercise Activities and Dietary recommendations Current Exercise Habits: Home exercise routine, Type of exercise: walking, Time (Minutes): 20, Frequency (Times/Week): 1, Weekly Exercise (Minutes/Week): 20, Intensity: Mild, Exercise limited by: orthopedic condition(s)  Goals    . Eat more fruits and vegetables    . Exercise 3x per week (30 min per time)     Patient is currently walking one time per week for 20 minutes.  Encouraged to aim for 1-2 more times per week if the weather permits.       Fall Risk Fall Risk  02/17/2020 01/28/2020 06/26/2019 06/12/2019 06/12/2019  Falls in the past year? 0 0 0 0 0  Number falls in past yr: - 0 0 - -  Injury with Fall? - 0 - - -  Risk for fall due to : Impaired balance/gait - - - -  Follow up - - Falls evaluation completed - -   Is the patient's home free of loose throw rugs in walkways, pet beds, electrical cords, etc?   yes      Grab bars in the bathroom? yes      Handrails on the stairs?   yes      Adequate lighting?   yes  Patient rating of health (0-10) scale: 9  Depression Screen PHQ 2/9 Scores 02/17/2020 01/28/2020 06/26/2019 06/12/2019  PHQ - 2 Score 0 0 0 0  PHQ- 9 Score 1 1 - -    Cognitive Function  MMSE - Mini Mental State Exam 01/06/2015 07/09/2013 10/23/2010  Not completed: (No Data) - -  Orientation to time - 5 5  Orientation to Place - 5 5   Registration - 3 3  Attention/ Calculation - 5 5  Recall - 3 3  Language- name 2 objects - 2 2  Language- repeat - 1 1  Language- follow 3 step command - 3 3  Language- read & follow direction - 1 1  Write a sentence - 1 1  Copy design - 1 1  Total score - 30 30   6CIT Screen 02/17/2020  What Year? 0 points  What month? 0 points  What time? 0 points  Count back from 20 0 points  Months in reverse 0 points  Repeat phrase 0 points  Total Score 0   Immunization History  Administered Date(s) Administered  . Fluad Quad(high Dose 65+) 12/15/2018, 01/08/2020  . Influenza Split 02/20/2011, 01/16/2012  . Influenza Whole 01/17/2009, 01/10/2010  . Influenza, High Dose Seasonal PF 01/01/2017, 01/17/2018  . Influenza,inj,Quad PF,6+ Mos 12/26/2012, 12/21/2013, 01/06/2015, 01/05/2016  . Influenza-Unspecified 12/25/2016  . PFIZER SARS-COV-2 Vaccination 04/22/2019, 05/13/2019, 01/28/2020  . Pneumococcal Conjugate-13 07/09/2013  . Pneumococcal Polysaccharide-23 07/13/2008  . Td 04/02/2004  . Tdap 04/02/2006, 09/02/2017  . Zoster 04/02/2012   Screening Tests Health Maintenance  Topic Date Due  . MAMMOGRAM  09/15/2020  . TETANUS/TDAP  09/03/2027  . INFLUENZA VACCINE  Completed  . DEXA SCAN  Completed  . COVID-19 Vaccine  Completed  . Hepatitis C Screening  Completed  . PNA vac Low Risk Adult  Completed   Cancer Screenings: Lung: Low Dose CT Chest recommended if Age 45-80 years, 30 pack-year currently smoking OR have quit w/in 15years. Patient does qualify. Breast:  Up to date on Mammogram? Yes   Up to date of Bone Density/Dexa? Yes Colorectal: UTD  Additional Screenings: Hepatitis C Screening: Completed    Plan:  Great job staying on top of your health maintenance! Follow-up with PCP in spring 2022. Try to aim for 2-3 walks a week at 30 minutes per time.  Good luck with the bathroom remodel!   I have personally reviewed and noted the following in the patient's chart:    . Medical and social history . Use of alcohol, tobacco or illicit drugs  . Current medications and supplements . Functional ability and status . Nutritional status . Physical activity . Advanced directives . List of other physicians . Hospitalizations, surgeries, and ER visits in previous 12 months . Vitals . Screenings to include cognitive, depression, and falls . Referrals and appointments  In addition, I have reviewed and discussed with patient certain preventive protocols, quality metrics, and best practice recommendations. A written personalized care plan for preventive services as well as general preventive health recommendations were provided to patient.   Dorna Bloom, [place]  02/17/2020

## 2020-02-17 NOTE — Patient Instructions (Signed)
You spoke to Dorna Bloom, [place] for your annual wellness visit.  We discussed goals: Goals    . Eat more fruits and vegetables    . Exercise 3x per week (30 min per time)     Patient is currently walking one time per week for 20 minutes.  Encouraged to aim for 1-2 more times per week if the weather permits.       We also discussed recommended health maintenance.As discussed, you are up to date with everything!   Health Maintenance  Topic Date Due  . MAMMOGRAM  09/15/2020  . TETANUS/TDAP  09/03/2027  . INFLUENZA VACCINE  Completed  . DEXA SCAN  Completed  . COVID-19 Vaccine  Completed  . Hepatitis C Screening  Completed  . PNA vac Low Risk Adult  Completed   Great job staying on top of your health maintenance! Follow-up with PCP in spring 2076. Try to aim for 2-3 walks a week at 30 minutes per time.  Good luck with the bathroom remodel!    Preventive Care 76 Years and Older, Female Preventive care refers to lifestyle choices and visits with your health care provider that can promote health and wellness. This includes:  A yearly physical exam. This is also called an annual well check.  Regular dental and eye exams.  Immunizations.  Screening for certain conditions.  Healthy lifestyle choices, such as diet and exercise. What can I expect for my preventive care visit? Physical exam Your health care provider will check:  Height and weight. These may be used to calculate body mass index (BMI), which is a measurement that tells if you are at a healthy weight.  Heart rate and blood pressure.  Your skin for abnormal spots. Counseling Your health care provider may ask you questions about:  Alcohol, tobacco, and drug use.  Emotional well-being.  Home and relationship well-being.  Sexual activity.  Eating habits.  History of falls.  Memory and ability to understand (cognition).  Work and work Statistician.  Pregnancy and menstrual history. What immunizations  do I need?  Influenza (flu) vaccine  This is recommended every year. Tetanus, diphtheria, and pertussis (Tdap) vaccine  You may need a Td booster every 10 years. Varicella (chickenpox) vaccine  You may need this vaccine if you have not already been vaccinated. Zoster (shingles) vaccine  You may need this after age 76. Pneumococcal conjugate (PCV13) vaccine  One dose is recommended after age 76. Pneumococcal polysaccharide (PPSV23) vaccine  One dose is recommended after age 76. Measles, mumps, and rubella (MMR) vaccine  You may need at least one dose of MMR if you were born in 1957 or later. You may also need a second dose. Meningococcal conjugate (MenACWY) vaccine  You may need this if you have certain conditions. Hepatitis A vaccine  You may need this if you have certain conditions or if you travel or work in places where you may be exposed to hepatitis A. Hepatitis B vaccine  You may need this if you have certain conditions or if you travel or work in places where you may be exposed to hepatitis B. Haemophilus influenzae type b (Hib) vaccine  You may need this if you have certain conditions. You may receive vaccines as individual doses or as more than one vaccine together in one shot (combination vaccines). Talk with your health care provider about the risks and benefits of combination vaccines. What tests do I need? Blood tests  Lipid and cholesterol levels. These may be checked  every 5 years, or more frequently depending on your overall health.  Hepatitis C test.  Hepatitis B test. Screening  Lung cancer screening. You may have this screening every year starting at age 50 if you have a 30-pack-year history of smoking and currently smoke or have quit within the past 15 years.  Colorectal cancer screening. All adults should have this screening starting at age 76 and continuing until age 78. Your health care provider may recommend screening at age 25 if you are at  increased risk. You will have tests every 1-10 years, depending on your results and the type of screening test.  Diabetes screening. This is done by checking your blood sugar (glucose) after you have not eaten for a while (fasting). You may have this done every 1-3 years.  Mammogram. This may be done every 1-2 years. Talk with your health care provider about how often you should have regular mammograms.  BRCA-related cancer screening. This may be done if you have a family history of breast, ovarian, tubal, or peritoneal cancers. Other tests  Sexually transmitted disease (STD) testing.  Bone density scan. This is done to screen for osteoporosis. You may have this done starting at age 42. Follow these instructions at home: Eating and drinking  Eat a diet that includes fresh fruits and vegetables, whole grains, lean protein, and low-fat dairy products. Limit your intake of foods with high amounts of sugar, saturated fats, and salt.  Take vitamin and mineral supplements as recommended by your health care provider.  Do not drink alcohol if your health care provider tells you not to drink.  If you drink alcohol: ? Limit how much you have to 0-1 drink a day. ? Be aware of how much alcohol is in your drink. In the [place] one drink equals one 12 oz bottle of beer (355 mL), one 5 oz glass of wine (148 mL), or one 1 oz glass of hard liquor (44 mL). Lifestyle  Take daily care of your teeth and gums.  Stay active. Exercise for at least 30 minutes on 5 or more days each week.  Do not use any products that contain nicotine or tobacco, such as cigarettes, e-cigarettes, and chewing tobacco. If you need help quitting, ask your health care provider.  If you are sexually active, practice safe sex. Use a condom or other form of protection in order to prevent STIs (sexually transmitted infections).  Talk with your health care provider about taking a low-dose aspirin or statin. What's next?  Go to your  health care provider once a year for a well check visit.  Ask your health care provider how often you should have your eyes and teeth checked.  Stay up to date on all vaccines. This information is not intended to replace advice given to you by your health care provider. Make sure you discuss any questions you have with your health care provider. Document Revised: 03/13/2018 Document Reviewed: 03/13/2018 Elsevier Patient Education  2020 [facility]'s number is [phone]. Please call with questions or concerns about what we discussed today.

## 2020-02-18 NOTE — Progress Notes (Signed)
I have reviewed this visit and agree with the documentation.   

## 2020-02-29 ENCOUNTER — Encounter: Payer: Self-pay | Admitting: Physical Therapy

## 2020-02-29 ENCOUNTER — Other Ambulatory Visit: Payer: Self-pay

## 2020-02-29 ENCOUNTER — Ambulatory Visit: Payer: Medicare Other | Attending: Family Medicine | Admitting: Physical Therapy

## 2020-02-29 DIAGNOSIS — R2689 Other abnormalities of gait and mobility: Secondary | ICD-10-CM | POA: Diagnosis not present

## 2020-02-29 DIAGNOSIS — M6281 Muscle weakness (generalized): Secondary | ICD-10-CM | POA: Insufficient documentation

## 2020-02-29 DIAGNOSIS — R2681 Unsteadiness on feet: Secondary | ICD-10-CM | POA: Diagnosis not present

## 2020-02-29 NOTE — Patient Instructions (Signed)
Access Code: [email or web address] URL: [email or web address] Date: 02/29/2020 Prepared by: [facility].  Exercises Sit to Stand with Armchair - 1 x daily - 7 x weekly - 1 sets - 10 reps Standing Hip Extension with Counter Support - 1 x daily - 7 x weekly - 3 sets - 10 reps Seated Toe Raise - 1 x daily - 7 x weekly - 3 sets - 10 reps

## 2020-02-29 NOTE — Therapy (Addendum)
[facility] [address] [place], [place], [address] Phone: [phone]   Fax:  [phone]  Physical Therapy Evaluation  Patient Details  Name: Laura Griffin MRN: [id number] Date of Birth: [birth year] Referring Provider (PT): Latrelle Dodrill, MD    Encounter Date: 02/29/2020   PT End of Session - 02/29/20 1103     Visit Number 1    Number of Visits 13    Date for PT Re-Evaluation 04/11/20    Authorization Type MCR/[employer] - FOTO at 6th and 10th visit. KX mod at 15th visit.    Progress Note Due on Visit 10    PT Start Time 1022   Pt arrived late   PT Stop Time 1102    PT Time Calculation (min) 40 min    Equipment Utilized During Treatment Other (comment)   Cane   Activity Tolerance Patient tolerated treatment well    Behavior During Therapy [facility] for tasks assessed/performed             Past Medical History:  Diagnosis Date   Allergic rhinitis 07/19/2014   Arthritis of knee, right 03/11/2006   Bursitis of shoulder, right 09/19/1999   Cervical radiculopathy at C6 05/24/2011   Beginning Nov 2012. Multi-level foraminal narrowing in C-spine plain films    Diverticulosis    DIVERTICULOSIS OF COLON 05/30/2006   Colonoscopy 06/2014 - multiple diverticula, no other abnormalities, no further colonoscopies recommended given patient's age of 30     GERD (gastroesophageal reflux disease) 07/19/2014   Diagnosed clinically by [facility] in January of 2016.     Glaucoma    Hearing decreased 08/07/2011   With whooshing tinnitus Mild bilateral loss on 08/2012 audiology who will recheck her hearing in one year     History of herniated intervertebral disc    Hypertension    HYPERTENSION, BENIGN SYSTEMIC 05/30/2006        Left sided sciatica 8/95,11/00   CT confirmed    LOW BACK PAIN SYNDROME 05/10/2009   Qualifier: Diagnosis of  By: Lorenda Hatchet CMA,, Thekla  Mild C 3-4, 4-5 lumbar spinal stenosis MRI 08/2011, Diffuse lower  thoracic and lumbar DDD    Mitral valve prolapse    per patient heart murmur   Orthostatic hypotension 06/12/2013   Osteoarthritis    Osteoarthritis of right knee 12/26/2012   Confirmed on xray 07/2014    OSTEOARTHRITIS, MULTI SITES 05/30/2006   She is stoic about pain and wants to avoid surgeries Past xrays have documented DJD in right knee and lumbar spine     Overweight (BMI 25.0-29.9) 05/30/2006   Pitting edema 07/19/2014   Bilateral 1+ pitting edema of calves first noted on 07/19/2014.     Sciatica 05/30/2006   Qualifier: Diagnosis of  By: Sheffield Slider MD, Wayne  Involved left leg in 02/1999    Swallowing difficulty 07/06/2013   Urge incontinence 06/01/2008   Qualifier: Diagnosis of  By: Sheffield Slider MD, Deniece Portela      Past Surgical History:  Procedure Laterality Date   CATARACT EXTRACTION  02/2011   left eye   TUBAL LIGATION  1973   VULVA /PERINEUM BIOPSY  2010   for hypopigmentation, non-specific result    There were no vitals filed for this visit.    Subjective Assessment - 02/29/20 1029     Subjective "My balance is off, concerned I'm going to fall at any second. My right knee feels like it's going to give out. Most of the time I don't feel comfortable  walking because I feel like I'm going to fall. I ignored it for several months but now I'm here to get it taken care of. Low back bithers me every day but every now and then the pain gets really bad and I have to take a break."    Limitations Walking;House hold activities    How long can you sit comfortably? With support unlimited    How long can you stand comfortably? 15-20 minutes before back pain flares up    How long can you walk comfortably? 30 minutes    Patient Stated Goals Better balance control, feel more comfortable and confident walking, reduced tentative walking pattern    Currently in Pain? Yes    Pain Score 3     Pain Location Back    Pain Orientation Lower;Mid    Pain Descriptors / Indicators Constant    Pain Type Chronic pain     Pain Onset More than a month ago    Pain Frequency Intermittent    Pain Relieving Factors Tylenol    Effect of Pain on Daily Activities Reduced time standing and walking    Multiple Pain Sites Yes    Pain Score 2   at worst 10/10 for short periods; lowest 2/10   Pain Location Knee    Pain Orientation Right    Pain Descriptors / Indicators Constant    Pain Type Chronic pain    Pain Onset More than a month ago    Pain Frequency Constant    Aggravating Factors  walking    Pain Relieving Factors Tylenol, raising up                  [facility] PT Assessment - 02/29/20 0001       Assessment   Medical Diagnosis Gait instability R26.81    Referring Provider (PT) Latrelle Dodrill, MD     Onset Date/Surgical Date 10/12/19    Next MD Visit 05/23/2020    Prior Therapy Yes, at this location for gait instability      Precautions   Precautions None      Restrictions   Weight Bearing Restrictions No      Balance Screen   Has the patient fallen in the past 6 months No    Has the patient had a decrease in activity level because of a fear of falling?  Yes    Is the patient reluctant to leave their home because of a fear of falling?  No      Home [employer]ist information centre manager residence    Living Arrangements Spouse/significant other    Available Help at Discharge Family    Type of Home House    Home Access Level entry    Home Layout One level    Home Equipment Cane - quad;Grab bars - tub/shower;Walker - 2 wheels    Additional Comments Grab bar to help getting into and out of bed      Prior Function   Level of Independence Independent   Assistance with heavy household chores   Vocation Retired    Leisure Travel to [employer]   Overall Cognitive Status Within Functional Limits for tasks assessed    Attention Focused      Observation/Other Assessments   Focus on Therapeutic Outcomes (FOTO)  58% limited projected to get to 49% in 11visits        Posture/Postural Control   Posture/Postural Control Postural limitations  Postural Limitations Rounded Shoulders;Forward head;Increased thoracic kyphosis      ROM / Strength   AROM / PROM / Strength Strength      Strength   Strength Assessment Site Hip;Knee    Right/Left Hip Right;Left    Right Hip Flexion 3+/5    Right Hip ABduction 4-/5    Left Hip Flexion 4-/5    Left Hip ABduction 3/5    Right/Left Knee Right;Left    Right Knee Flexion 4-/5   arthritic pain elicited   Right Knee Extension 4/5   arthritic pain elicited   Left Knee Flexion 4/5    Left Knee Extension 4/5    Right/Left Ankle Right;Left    Right Ankle Dorsiflexion 4-/5    Right Ankle Plantar Flexion 4-/5    Left Ankle Dorsiflexion 4-/5    Left Ankle Plantar Flexion 4-/5      Ambulation/Gait   Ambulation/Gait Yes    Ambulation/Gait Assistance 6: Modified independent (Device/Increase time)   AD, slowed pace   Assistive device Straight cane    Gait Pattern Step-through pattern;Decreased dorsiflexion - right;Decreased dorsiflexion - left;Decreased weight shift to left;Right foot flat;Left foot flat;Shuffle    Ambulation Surface Level;Indoor    Gait velocity Reduce (fear of falling)      Balance   Balance Assessed Yes      Standardized Balance Assessment   Standardized Balance Assessment Timed Up and Go Test;Five Times Sit to Stand    Five times sit to stand comments  Pt utilized bilateral UE and demonstrated near loss of balance; deferred to future visit      Timed Up and Go Test   Normal TUG (seconds) 29   with AD in left UE   TUG Comments Block turning utilized                                Objective measurements completed on examination: See above findings.          [facility] Adult PT Treatment/Exercise - 02/29/20 0001       Lumbar Exercises: Seated   Sit to Stand 5 reps   SBA     Knee/Hip Exercises: Stretches   Active Hamstring Stretch Both;1 rep;30 seconds    Active Hamstring  Stretch Limitations VC for trunk lean, leg straight, heel into ground      Knee/Hip Exercises: Standing   Hip Extension AROM;Both;1 set;10 reps;Knee straight    Extension Limitations VC for slow and controlled and stand up tall      Ankle Exercises: Seated   Heel Raises Both;5 reps    Toe Raise 5 reps    Toe Raise Limitations VC for slow and controlled                          PT Education - 02/29/20 1107     Education Details Importance of therapy for balance and strengthening; reviewed FOTO, issued and reviewed HEP    Person(s) Educated Patient    Methods Explanation    Comprehension Verbalized understanding              PT Short Term Goals - 02/29/20 1127       PT SHORT TERM GOAL #1   Title Pt will be IND with HEP to demonstration functional capacity for ADLs    Time 3    Period Weeks    Status New    Target  Date 03/21/20      PT SHORT TERM GOAL #2   Title Pt will increase standing time from 15 minutes to 30 minutes with </=2/10 pain report to increase independence with ADLs    Time 3    Period Weeks    Status New    Target Date 03/21/20      PT SHORT TERM GOAL #3   Title Pt will be tested on 5xSTS and BERG and educated on results    Time 3    Status New    Target Date 03/21/20                PT Long Term Goals - 02/29/20 1128       PT LONG TERM GOAL #1   Title Pt will be fully IND with all provided HEP to demonstrate ability to maintain functional independence    Time 6    Period Weeks    Status New      PT LONG TERM GOAL #2   Title Pt will decrease FOTO limitation to 49% to demonstrate increased agency and quality of life.    Time 6    Period Weeks    Status New    Target Date 04/11/20      PT LONG TERM GOAL #3   Title Pt will demonstrate increased gross hip and LE strength to >/=4+/5 to improve gait and reduce risk of falls.    Time 6    Period Weeks    Status New    Target Date 04/11/20      PT LONG TERM GOAL #4   Title  Pt will reduce manual TUG score by >/=8s to reduce fall risk and improve independence with ADLs    Baseline 29s    Time 6    Period Weeks    Status New    Target Date 04/11/20      PT LONG TERM GOAL #5   Title Pt will improve score on balance assessments including 5xSTS and BERG to demonstrate reduction in fall risk    Time 6    Period Weeks    Status New    Target Date 04/11/20                         Plan - 02/29/20 1119     Clinical Impression Statement Pt is a pleasant 74 year-old female who presents to O[facility] with complaints of balance and gait instability secondary to a fear of falling. Pt reports no falls in the last 6 months but has reduced her gait speed and activity level in response to increased fear. Pt demonstrated gross weakness in bilateral hips and LEs and manual TUG demonstrated high fall risk. Pt will benefit from skilled therapy for balance training, lower quarter strengthening, and general edurance/aerobic training to reduce fall risk and increase independence with ADLs and quality of life.    Personal Factors and Comorbidities Comorbidity 2;Comorbidity 3+    Comorbidities Hx of HTN, OA especially right knee, herniated lumbar disc    Examination-Activity Limitations Stand;Stairs;Squat    Examination-Participation Restrictions Cleaning    Stability/Clinical Decision Making Evolving/Moderate complexity    Clinical Decision Making Moderate    Rehab Potential Good    PT Frequency 2x / week    PT Duration 6 weeks    PT Treatment/Interventions ADLs/Self Care Home Management;DME Instruction;Moist Heat;Iontophoresis 4mg /ml Dexamethasone;Stair training;Gait training;Functional mobility training;Therapeutic activities;Therapeutic exercise;Balance training;Neuromuscular re-education;Manual techniques;Patient/family education;Dry needling;Energy conservation;Joint Manipulations;Spinal Manipulations;Taping  PT Next Visit Plan Continue balance assessment (BERG, ,  SLS), practice heel-toe gait, review HEP    PT Home Exercise Plan [id number] - seated hamstring stretch, STS with arms, heel and toe raises (seated), standing hip extension    Consulted and Agree with Plan of Care Patient             Patient will benefit from skilled therapeutic intervention in order to improve the following deficits and impairments:  Abnormal gait, Decreased activity tolerance, Decreased balance, Decreased mobility, Decreased endurance, Decreased strength, Difficulty walking, Postural dysfunction, Pain  Visit Diagnosis: Other abnormalities of gait and mobility  Muscle weakness (generalized)  Unsteadiness on feet      Problem List Patient Active Problem List   Diagnosis Date Noted   Ganglion cyst of dorsum of right wrist 12/15/2018   Lichen sclerosus of female genitalia 09/25/2018    Class: Chronic   Hyperlipidemia 03/17/2018   Lichen planus 03/17/2018   Numbness and tingling of right leg 10/19/2016   Shoulder pain 03/01/2016   Headache 03/01/2016   Asymmetric SNHL (sensorineural hearing loss) 01/10/2016   Tinnitus of both ears 01/10/2016   Hearing loss 10/07/2015   Thyroid nodule 05/24/2015   Globus sensation 05/05/2015   Neck pain 01/07/2015   Allergic rhinitis 07/19/2014   GERD (gastroesophageal reflux disease) 07/19/2014   Pitting edema 07/19/2014   Swallowing difficulty 07/06/2013   Osteoarthritis of right knee 12/26/2012   GLAUCOMA 11/04/2009   DDD (degenerative disc disease), lumbar 05/10/2009   URGE INCONTINENCE 06/01/2008   Overweight (BMI 25.0-29.9) 05/30/2006   HYPERTENSION, BENIGN SYSTEMIC 05/30/2006   Osteoarthritis 05/30/2006    Johnn Hai, SPT 02/29/2020, 12:47 PM  [facility] [address] [place], [place], [address] Phone: [phone]   Fax:  [phone]  Name: Laura Griffin MRN: [id number] Date of Birth: 12-05-1945

## 2020-03-02 ENCOUNTER — Other Ambulatory Visit: Payer: Self-pay

## 2020-03-02 DIAGNOSIS — L900 Lichen sclerosus et atrophicus: Secondary | ICD-10-CM

## 2020-03-02 NOTE — Telephone Encounter (Signed)
Medication refill request: Temovate  Last AEX:  01/18/20 Next AEX: 01/30/21 Last MMG (if hormonal medication request): Na Refill authorized: 60g/0  Pt requesting new script

## 2020-03-02 NOTE — Telephone Encounter (Signed)
Patient is calling for refill or Temovate. Patient states she needed a new prescription request.

## 2020-03-03 MED ORDER — CLOBETASOL PROPIONATE 0.05 % EX OINT: TOPICAL_OINTMENT | CUTANEOUS | 1 refills | Status: DC

## 2020-03-08 ENCOUNTER — Ambulatory Visit: Payer: Medicare Other | Attending: Family Medicine

## 2020-03-08 ENCOUNTER — Other Ambulatory Visit: Payer: Self-pay

## 2020-03-08 DIAGNOSIS — R2681 Unsteadiness on feet: Secondary | ICD-10-CM | POA: Insufficient documentation

## 2020-03-08 DIAGNOSIS — R2689 Other abnormalities of gait and mobility: Secondary | ICD-10-CM | POA: Diagnosis not present

## 2020-03-08 DIAGNOSIS — M6281 Muscle weakness (generalized): Secondary | ICD-10-CM | POA: Diagnosis not present

## 2020-03-08 DIAGNOSIS — R262 Difficulty in walking, not elsewhere classified: Secondary | ICD-10-CM | POA: Insufficient documentation

## 2020-03-08 NOTE — Therapy (Signed)
[facility] [address] [place], [place], [address] Phone: [phone]   Fax:  [phone]  Physical Therapy Treatment  Patient Details  Name: Laura Griffin MRN: [id number] Date of Birth: 1945-10-31 Referring Provider (PT): Latrelle Dodrill, MD    Encounter Date: 03/08/2020   PT End of Session - 03/08/20 1118    Visit Number 2    Number of Visits 13    Date for PT Re-Evaluation 04/11/20    Authorization Type MCR/[employer] - FOTO at 6th and 10th visit. KX mod at 15th visit.    Progress Note Due on Visit 10    PT Start Time 1002    PT Stop Time 1049    PT Time Calculation (min) 47 min    Equipment Utilized During Treatment Other (comment)    Activity Tolerance Patient tolerated treatment well    Behavior During Therapy WFL for tasks assessed/performed           Past Medical History:  Diagnosis Date  . Allergic rhinitis 07/19/2014  . Arthritis of knee, right 03/11/2006  . Bursitis of shoulder, right 09/19/1999  . Cervical radiculopathy at C6 05/24/2011   Beginning Nov 2012. Multi-level foraminal narrowing in C-spine plain films   . Diverticulosis   . DIVERTICULOSIS OF COLON 05/30/2006   Colonoscopy 06/2014 - multiple diverticula, no other abnormalities, no further colonoscopies recommended given patient's age of 65    . GERD (gastroesophageal reflux disease) 07/19/2014   Diagnosed clinically by [facility] in January of 2016.    . Glaucoma   . Hearing decreased 08/07/2011   With whooshing tinnitus Mild bilateral loss on 08/2012 audiology who will recheck her hearing in one year    . History of herniated intervertebral disc   . Hypertension   . HYPERTENSION, BENIGN SYSTEMIC 05/30/2006       . Left sided sciatica 8/95,11/00   CT confirmed   . LOW BACK PAIN SYNDROME 05/10/2009   Qualifier: Diagnosis of  By: Lorenda Hatchet CMA,, Thekla  Mild C 3-4, 4-5 lumbar spinal stenosis MRI 08/2011, Diffuse lower thoracic and lumbar DDD    . Mitral valve prolapse    per patient heart murmur  . Orthostatic hypotension 06/12/2013  . Osteoarthritis   . Osteoarthritis of right knee 12/26/2012   Confirmed on xray 07/2014   . OSTEOARTHRITIS, MULTI SITES 05/30/2006   She is stoic about pain and wants to avoid surgeries Past xrays have documented DJD in right knee and lumbar spine    . Overweight (BMI 25.0-29.9) 05/30/2006  . Pitting edema 07/19/2014   Bilateral 1+ pitting edema of calves first noted on 07/19/2014.    . Sciatica 05/30/2006   Qualifier: Diagnosis of  By: Sheffield Slider MD, Wayne  Involved left leg in 02/1999   . Swallowing difficulty 07/06/2013  . Urge incontinence 06/01/2008   Qualifier: Diagnosis of  By: Sheffield Slider MD, Deniece Portela      Past Surgical History:  Procedure Laterality Date  . CATARACT EXTRACTION  02/2011   left eye  . TUBAL LIGATION  1973  . VULVA /PERINEUM BIOPSY  2010   for hypopigmentation, non-specific result    There were no vitals filed for this visit.   Subjective Assessment - 03/08/20 1009    Subjective Generally feeling good after a busy day yesterday.    Limitations Walking;House hold activities    How long can you sit comfortably? With support unlimited    How long can you stand comfortably? 15-20 minutes before back pain  flares up    How long can you walk comfortably? 30 minutes    Patient Stated Goals Better balance control, feel more comfortable and confident walking, reduced tentative walking pattern    Currently in Pain? Yes    Pain Score 2     Pain Location Back    Pain Orientation Lower;Mid    Pain Descriptors / Indicators Aching    Pain Type Chronic pain    Pain Onset More than a month ago   25 years ago   Pain Frequency Intermittent    Aggravating Factors  Lifting, bending, walking and standing too long    Pain Relieving Factors Tylenol    Effect of Pain on Daily Activities Reduced time standing and walking    Multiple Pain Sites Yes    Pain Score 5    Pain Location Knee    Pain Orientation  Right    Pain Descriptors / Indicators Discomfort    Pain Type Chronic pain    Pain Onset More than a month ago    Pain Frequency Constant    Aggravating Factors  Walking    Pain Relieving Factors Tylenol    Effect of Pain on Daily Activities Limits time on her feet                             [facility] Adult PT Treatment/Exercise - 03/08/20 0001      Ambulation/Gait   Ambulation/Gait Assistance 6: Modified independent (Device/Increase time)   AD, slowed pace   Assistive device Straight cane    Gait Pattern Step-through pattern;Decreased dorsiflexion - right;Decreased dorsiflexion - left;Decreased weight shift to left;Right foot flat;Left foot flat;Shuffle      Exercises   Exercises Knee/Hip      Knee/Hip Exercises: Stretches   Active Hamstring Stretch Right;Left;2 reps;20 seconds    Active Hamstring Stretch Limitations VC for trunk lean, leg straight, heel into ground      Knee/Hip Exercises: Standing   Hip Extension AROM;1 set;10 reps;Knee straight;Right;Left      Knee/Hip Exercises: Seated   Other Seated Knee/Hip Exercises Hip add set c ball squeeze, 5 sec, 10x    Other Seated Knee/Hip Exercises Hip abd c red Tband, L and R, 10x    Sit to Sand 2 sets;5 reps;with UE support   VC for proper techique, CG over feet     Ankle Exercises: Seated   Heel Raises Both;10 reps    Toe Raise 10 reps                  PT Education - 03/08/20 1116    Education Details HEP. Proper sit t/f standing with use of hands. Gilmer Mor was lowered for better fit and support    Person(s) Educated Patient    Methods Explanation;Demonstration;Tactile cues;Handout;Verbal cues    Comprehension Verbalized understanding;Returned demonstration;Verbal cues required;Tactile cues required            PT Short Term Goals - 02/29/20 1127      PT SHORT TERM GOAL #1   Title Pt will be IND with HEP to demonstration functional capacity for ADLs    Time 3    Period Weeks    Status New     Target Date 03/21/20      PT SHORT TERM GOAL #2   Title Pt will increase standing time from 15 minutes to 30 minutes with </=2/10 pain report to increase independence with ADLs  Time 3    Period Weeks    Status New    Target Date 03/21/20      PT SHORT TERM GOAL #3   Title Pt will be tested on 5xSTS and BERG and educated on results    Time 3    Status New    Target Date 03/21/20             PT Long Term Goals - 02/29/20 1128      PT LONG TERM GOAL #1   Title Pt will be fully IND with all provided HEP to demonstrate ability to maintain functional independence    Time 6    Period Weeks    Status New      PT LONG TERM GOAL #2   Title Pt will decrease FOTO limitation to 49% to demonstrate increased agency and quality of life.    Time 6    Period Weeks    Status New    Target Date 04/11/20      PT LONG TERM GOAL #3   Title Pt will demonstrate increased gross hip and LE strength to >/=4+/5 to improve gait and reduce risk of falls.    Time 6    Period Weeks    Status New    Target Date 04/11/20      PT LONG TERM GOAL #4   Title Pt will reduce manual TUG score by >/=8s to reduce fall risk and improve independence with ADLs    Baseline 29s    Time 6    Period Weeks    Status New    Target Date 04/11/20      PT LONG TERM GOAL #5   Title Pt will improve score on balance assessments including 5xSTS and BERG to demonstrate reduction in fall risk    Time 6    Period Weeks    Status New    Target Date 04/11/20                 Plan - 03/08/20 1119    Clinical Impression Statement PT was focused on developing a HEP for LE strengthening. Instructed in proper st t/f standing and use of the cane. The cane was lowered for a more proper fit. Pt returned demonstration with exs and use of cane.    Personal Factors and Comorbidities Comorbidity 2;Comorbidity 3+    Comorbidities Hx of HTN, OA especially right knee, herniated lumbar disc    Examination-Activity  Limitations Stand;Stairs;Squat    Examination-Participation Restrictions Cleaning    Stability/Clinical Decision Making Evolving/Moderate complexity    Clinical Decision Making Moderate    Rehab Potential Good    PT Frequency 2x / week    PT Duration 6 weeks    PT Treatment/Interventions ADLs/Self Care Home Management;DME Instruction;Moist Heat;Iontophoresis 4mg /ml Dexamethasone;Stair training;Gait training;Functional mobility training;Therapeutic activities;Therapeutic exercise;Balance training;Neuromuscular re-education;Manual techniques;Patient/family education;Dry needling;Energy conservation;Joint Manipulations;Spinal Manipulations;Taping    PT Next Visit Plan Continue balance assessment (BERG, 5MWT, SLS), practice heel-toe gait, review HEP    PT Home Exercise Plan [id number] - seated hamstring stretch, STS with arms, heel and toe raises (seated), standing hip extension, hip add sets seated, hip abd c red Tband seated    Consulted and Agree with Plan of Care Patient           Patient will benefit from skilled therapeutic intervention in order to improve the following deficits and impairments:  Abnormal gait, Decreased activity tolerance, Decreased balance, Decreased mobility, Decreased endurance, Decreased strength, Difficulty walking,  Postural dysfunction, Pain  Visit Diagnosis: Other abnormalities of gait and mobility  Muscle weakness (generalized)  Unsteadiness on feet  Difficulty in walking, not elsewhere classified     Problem List Patient Active Problem List   Diagnosis Date Noted  . Ganglion cyst of dorsum of right wrist 12/15/2018  . Lichen sclerosus of female genitalia 09/25/2018    Class: Chronic  . Hyperlipidemia 03/17/2018  . Lichen planus 03/17/2018  . Numbness and tingling of right leg 10/19/2016  . Shoulder pain 03/01/2016  . Headache 03/01/2016  . Asymmetric SNHL (sensorineural hearing loss) 01/10/2016  . Tinnitus of both ears 01/10/2016  . Hearing loss  10/07/2015  . Thyroid nodule 05/24/2015  . Globus sensation 05/05/2015  . Neck pain 01/07/2015  . Allergic rhinitis 07/19/2014  . GERD (gastroesophageal reflux disease) 07/19/2014  . Pitting edema 07/19/2014  . Swallowing difficulty 07/06/2013  . Osteoarthritis of right knee 12/26/2012  . GLAUCOMA 11/04/2009  . DDD (degenerative disc disease), lumbar 05/10/2009  . URGE INCONTINENCE 06/01/2008  . Overweight (BMI 25.0-29.9) 05/30/2006  . HYPERTENSION, BENIGN SYSTEMIC 05/30/2006  . Osteoarthritis 05/30/2006    Joellyn Rued MS, PT 03/08/20 11:26 AM  [facility] [address] [place], [place], [address] Phone: [phone]   Fax:  [phone]  Name: Laura Griffin MRN: [id number] Date of Birth: 1945-11-15

## 2020-03-11 NOTE — Telephone Encounter (Signed)
Received call from [employer] for PA for [employer] Rx.  Spoke with Dot Lanes and states PA was approved from 02/10/2020-03/11/2021.  CASE ID: [id number]  Call placed to pt. Pt given update on approved PA. Pt thankful for call and update. Will wait for pharmacy to get Rx refill.  Encounter closed

## 2020-03-15 ENCOUNTER — Other Ambulatory Visit: Payer: Self-pay

## 2020-03-15 ENCOUNTER — Ambulatory Visit: Payer: Medicare Other

## 2020-03-15 DIAGNOSIS — R2681 Unsteadiness on feet: Secondary | ICD-10-CM | POA: Diagnosis not present

## 2020-03-15 DIAGNOSIS — M6281 Muscle weakness (generalized): Secondary | ICD-10-CM | POA: Diagnosis not present

## 2020-03-15 DIAGNOSIS — R262 Difficulty in walking, not elsewhere classified: Secondary | ICD-10-CM | POA: Diagnosis not present

## 2020-03-15 DIAGNOSIS — R2689 Other abnormalities of gait and mobility: Secondary | ICD-10-CM

## 2020-03-15 NOTE — Therapy (Signed)
[facility] [address] [place], [place], [address] Phone: [phone]   Fax:  [phone]  Physical Therapy Treatment  Patient Details  Name: Laura Griffin MRN: [id number] Date of Birth: 08-20-1945 Referring Provider (Laura Griffin): Latrelle DodrillMcIntyre, Brittany J, MD    Encounter Date: 03/15/2020   Laura Griffin End of Session - 03/15/20 1044    Visit Number 3    Number of Visits 13    Date for Laura Griffin Re-Evaluation 04/11/20    Authorization Type MCR/[employer]ign[employer] - FOTO at 6th and 10th visit. KX mod at 15th visit.    Progress Note Due on Visit 10    Laura Griffin Start Time 1002    Laura Griffin Stop Time 1046    Laura Griffin Time Calculation (min) 44 min    Equipment Utilized During Treatment Other (comment)    Activity Tolerance Patient tolerated treatment well    Behavior During Therapy WFL for tasks assessed/performed           Past Medical History:  Diagnosis Date  . Allergic rhinitis 07/19/2014  . Arthritis of knee, right 03/11/2006  . Bursitis of shoulder, right 09/19/1999  . Cervical radiculopathy at C6 05/24/2011   Beginning Nov 2012. Multi-level foraminal narrowing in C-spine plain films   . Diverticulosis   . DIVERTICULOSIS OF COLON 05/30/2006   Colonoscopy 06/2014 - multiple diverticula, no other abnormalities, no further colonoscopies recommended given patient's age of 74    . GERD (gastroesophageal reflux disease) 07/19/2014   Diagn[facility] in January of 2016.    . Glaucoma   . Hearing decreased 08/07/2011   With whooshing tinnitus Mild bilateral loss on 08/2012 audiology who will recheck her hearing in one year    . History of herniated intervertebral disc   . Hypertension   . HYPERTENSION, BENIGN SYSTEMIC 05/30/2006       . Left sided sciatica 8/95,11/00   CT confirmed   . LOW BACK PAIN SYNDROME 05/10/2009   Qualifier: Diagnosis of  By: Lorenda HatchetSlade CMA,, Thekla  Mild C 3-4, 4-5 lumbar spinal stenosis MRI 08/2011, Diffuse lower thoracic and lumbar DDD    . Mitral valve prolapse    per patient heart murmur  . Orthostatic hypotension 06/12/2013  . Osteoarthritis   . Osteoarthritis of right knee 12/26/2012   Confirmed on xray 07/2014   . OSTEOARTHRITIS, MULTI SITES 05/30/2006   She is stoic about pain and wants to avoid surgeries Past xrays have documented DJD in right knee and lumbar spine    . Overweight (BMI 25.0-29.9) 05/30/2006  . Pitting edema 07/19/2014   Bilateral 1+ pitting edema of calves first noted on 07/19/2014.    . Sciatica 05/30/2006   Qualifier: Diagnosis of  By: Sheffield SliderHale MD, Wayne  Involved left leg in 02/1999   . Swallowing difficulty 07/06/2013  . Urge incontinence 06/01/2008   Qualifier: Diagnosis of  By: Sheffield SliderHale MD, Deniece PortelaWayne      Past Surgical History:  Procedure Laterality Date  . CATARACT EXTRACTION  02/2011   left eye  . TUBAL LIGATION  1973  . VULVA /PERINEUM BIOPSY  2010   for hypopigmentation, non-specific result    There were no vitals filed for this visit.   Subjective Assessment - 03/15/20 1007    Subjective Laura Griffin reports she has been completing her HEP consistently.    Limitations Walking;House hold activities    How long can you sit comfortably? With support unlimited    How long can you stand comfortably? 15-20 minutes before back  pain flares up    How long can you walk comfortably? 30 minutes    Patient Stated Goals Better balance control, feel more comfortable and confident walking, reduced tentative walking pattern    Currently in Pain? Yes    Pain Score 0-No pain    Pain Location Back    Pain Orientation Lower;Mid    Pain Descriptors / Indicators Aching    Pain Type Chronic pain    Pain Onset More than a month ago    Pain Frequency Intermittent    Aggravating Factors  Lifting, bending, walking and standing too long    Pain Relieving Factors Tylenol    Effect of Pain on Daily Activities Reduced time standing and walking    Pain Score 5    Pain Location Knee    Pain Orientation Right    Pain Descriptors /  Indicators Discomfort    Pain Onset More than a month ago    Pain Frequency Constant    Aggravating Factors  Walking    Pain Relieving Factors Tylenol    Effect of Pain on Daily Activities Limits time on her feet                             [facility] Adult Laura Griffin Treatment/Exercise - 03/15/20 0001      Knee/Hip Exercises: Standing   Hip Extension Right;Left;10 reps;Knee straight      Knee/Hip Exercises: Seated   Other Seated Knee/Hip Exercises Hip add set c ball squeeze, 5 sec, 10x    Other Seated Knee/Hip Exercises Hip abd c red Tband, L and R, 10x    Sit to Sand with UE support;10 reps   VC for proper techique, CG over feet. Contolled dsc.     Ankle Exercises: Seated   Heel Raises Both;10 reps    Toe Raise 10 reps                  Laura Griffin Education - 03/15/20 1124    Education Details review of ther ex/HEP    Person(s) Educated Patient    Methods Explanation;Demonstration;Tactile cues;Verbal cues;Handout    Comprehension Verbalized understanding;Returned demonstration;Verbal cues required;Tactile cues required;Need further instruction            Laura Griffin Short Term Goals - 02/29/20 1127      Laura Griffin SHORT TERM GOAL #1   Title Laura Griffin will be IND with HEP to demonstration functional capacity for ADLs    Time 3    Period Weeks    Status New    Target Date 03/21/20      Laura Griffin SHORT TERM GOAL #2   Title Laura Griffin will increase standing time from 15 minutes to 30 minutes with </=2/10 pain report to increase independence with ADLs    Time 3    Period Weeks    Status New    Target Date 03/21/20      Laura Griffin SHORT TERM GOAL #3   Title Laura Griffin will be tested on 5xSTS and BERG and educated on results    Time 3    Status New    Target Date 03/21/20             Laura Griffin Long Term Goals - 02/29/20 1128      Laura Griffin LONG TERM GOAL #1   Title Laura Griffin will be fully IND with all provided HEP to demonstrate ability to maintain functional independence    Time 6    Period Weeks  Status New      Laura Griffin  LONG TERM GOAL #2   Title Laura Griffin will decrease FOTO limitation to 49% to demonstrate increased agency and quality of life.    Time 6    Period Weeks    Status New    Target Date 04/11/20      Laura Griffin LONG TERM GOAL #3   Title Laura Griffin will demonstrate increased gross hip and LE strength to >/=4+/5 to improve gait and reduce risk of falls.    Time 6    Period Weeks    Status New    Target Date 04/11/20      Laura Griffin LONG TERM GOAL #4   Title Laura Griffin will reduce manual TUG score by >/=8s to reduce fall risk and improve independence with ADLs    Baseline 29s    Time 6    Period Weeks    Status New    Target Date 04/11/20      Laura Griffin LONG TERM GOAL #5   Title Laura Griffin will improve score on balance assessments including 5xSTS and BERG to demonstrate reduction in fall risk    Time 6    Period Weeks    Status New    Target Date 04/11/20                 Plan - 03/15/20 1124    Clinical Impression Statement Laura Griffin was provided to continue the LE strengthening process and to reinforce proper technique of Laura Griffin's HEP. Following Laura Griffin session, Laura Griffin returned demonstration of her hter ex/HEP. With the completion of the ther ex it was apparent the R LE was weaker than the L.    Personal Factors and Comorbidities Comorbidity 2;Comorbidity 3+    Comorbidities Hx of HTN, OA especially right knee, herniated lumbar disc    Examination-Activity Limitations Stand;Stairs;Squat    Examination-Participation Restrictions Cleaning    Stability/Clinical Decision Making Evolving/Moderate complexity    Clinical Decision Making Moderate    Rehab Potential Good    Laura Griffin Frequency 2x / week    Laura Griffin Duration 6 weeks    Laura Griffin Treatment/Interventions ADLs/Self Care Home Management;DME Instruction;Moist Heat;Iontophoresis 4mg /ml Dexamethasone;Stair training;Gait training;Functional mobility training;Therapeutic activities;Therapeutic exercise;Balance training;Neuromuscular re-education;Manual techniques;Patient/family education;Dry needling;Energy  conservation;Joint Manipulations;Spinal Manipulations;Taping    Laura Griffin Next Visit Plan Continue balance assessment (BERG, , SLS), practice heel-toe gait, review HEP. Add balance and trunk strengthening exs as indicated    Laura Griffin Home Exercise Plan - seated hamstring stretch, STS with arms, heel and toe raises (seated), standing hip extension, hip add sets seated, hip abd c red Tband seated    Consulted and Agree with Plan of Care Patient           Patient will benefit from skilled therapeutic intervention in order to improve the following deficits and impairments:  Abnormal gait,Decreased activity tolerance,Decreased balance,Decreased mobility,Decreased endurance,Decreased strength,Difficulty walking,Postural dysfunction,Pain  Visit Diagnosis: Other abnormalities of gait and mobility  Muscle weakness (generalized)  Unsteadiness on feet  Difficulty in walking, not elsewhere classified     Problem List Patient Active Problem List   Diagnosis Date Noted  . Ganglion cyst of dorsum of right wrist 12/15/2018  . Lichen sclerosus of female genitalia 09/25/2018    Class: Chronic  . Hyperlipidemia 03/17/2018  . Lichen planus 03/17/2018  . Numbness and tingling of right leg 10/19/2016  . Shoulder pain 03/01/2016  . Headache 03/01/2016  . Asymmetric SNHL (sensorineural hearing loss) 01/10/2016  . Tinnitus of both ears 01/10/2016  . Hearing loss 10/07/2015  .  Thyroid nodule 05/24/2015  . Globus sensation 05/05/2015  . Neck pain 01/07/2015  . Allergic rhinitis 07/19/2014  . GERD (gastroesophageal reflux disease) 07/19/2014  . Pitting edema 07/19/2014  . Swallowing difficulty 07/06/2013  . Osteoarthritis of right knee 12/26/2012  . GLAUCOMA 11/04/2009  . DDD (degenerative disc disease), lumbar 05/10/2009  . URGE INCONTINENCE 06/01/2008  . Overweight (BMI 25.0-29.9) 05/30/2006  . HYPERTENSION, BENIGN SYSTEMIC 05/30/2006  . Osteoarthritis 05/30/2006    Laura Griffin,  Laura Griffin 03/15/20 11:32 AM  [facility] [address] [place], [place], [address] Phone: [phone]   Fax:  [phone]  Name: Laura Griffin MRN: [id number] Date of Birth: January 02, 1946

## 2020-03-18 ENCOUNTER — Other Ambulatory Visit: Payer: Self-pay

## 2020-03-18 ENCOUNTER — Ambulatory Visit: Payer: Medicare Other

## 2020-03-18 DIAGNOSIS — M6281 Muscle weakness (generalized): Secondary | ICD-10-CM

## 2020-03-18 DIAGNOSIS — R262 Difficulty in walking, not elsewhere classified: Secondary | ICD-10-CM | POA: Diagnosis not present

## 2020-03-18 DIAGNOSIS — R2681 Unsteadiness on feet: Secondary | ICD-10-CM | POA: Diagnosis not present

## 2020-03-18 DIAGNOSIS — R2689 Other abnormalities of gait and mobility: Secondary | ICD-10-CM

## 2020-03-18 NOTE — Therapy (Signed)
[facility] [address] [place], [place], [address] Phone: [phone]   Fax:  [phone]  Physical Therapy Treatment  Patient Details  Name: Laura Griffin MRN: [id number] Date of Birth: January 27, 1946 Referring Provider (PT): Latrelle Dodrill, MD    Encounter Date: 03/18/2020   PT End of Session - 03/18/20 1112    Visit Number 4    Number of Visits 13    Date for PT Re-Evaluation 04/11/20    Authorization Type MCR/[employer] - FOTO at 6th and 10th visit. KX mod at 15th visit.    Progress Note Due on Visit 10    PT Start Time 1053    PT Stop Time 1133    PT Time Calculation (min) 40 min    Equipment Utilized During Treatment Other (comment)   SPC   Activity Tolerance Patient tolerated treatment well    Behavior During Therapy WFL for tasks assessed/performed           Past Medical History:  Diagnosis Date  . Allergic rhinitis 07/19/2014  . Arthritis of knee, right 03/11/2006  . Bursitis of shoulder, right 09/19/1999  . Cervical radiculopathy at C6 05/24/2011   Beginning Nov 2012. Multi-level foraminal narrowing in C-spine plain films   . Diverticulosis   . DIVERTICULOSIS OF COLON 05/30/2006   Colonoscopy 06/2014 - multiple diverticula, no other abnormalities, no further colonoscopies recommended given patient's age of 46    . GERD (gastroesophageal reflux disease) 07/19/2014   Diagnosed clinically by [facility] in January of 2016.    . Glaucoma   . Hearing decreased 08/07/2011   With whooshing tinnitus Mild bilateral loss on 08/2012 audiology who will recheck her hearing in one year    . History of herniated intervertebral disc   . Hypertension   . HYPERTENSION, BENIGN SYSTEMIC 05/30/2006       . Left sided sciatica 8/95,11/00   CT confirmed   . LOW BACK PAIN SYNDROME 05/10/2009   Qualifier: Diagnosis of  By: Lorenda Hatchet CMA,, Thekla  Mild C 3-4, 4-5 lumbar spinal stenosis MRI 08/2011, Diffuse lower thoracic and  lumbar DDD   . Mitral valve prolapse    per patient heart murmur  . Orthostatic hypotension 06/12/2013  . Osteoarthritis   . Osteoarthritis of right knee 12/26/2012   Confirmed on xray 07/2014   . OSTEOARTHRITIS, MULTI SITES 05/30/2006   She is stoic about pain and wants to avoid surgeries Past xrays have documented DJD in right knee and lumbar spine    . Overweight (BMI 25.0-29.9) 05/30/2006  . Pitting edema 07/19/2014   Bilateral 1+ pitting edema of calves first noted on 07/19/2014.    . Sciatica 05/30/2006   Qualifier: Diagnosis of  By: Sheffield Slider MD, Wayne  Involved left leg in 02/1999   . Swallowing difficulty 07/06/2013  . Urge incontinence 06/01/2008   Qualifier: Diagnosis of  By: Sheffield Slider MD, Deniece Portela      Past Surgical History:  Procedure Laterality Date  . CATARACT EXTRACTION  02/2011   left eye  . TUBAL LIGATION  1973  . VULVA /PERINEUM BIOPSY  2010   for hypopigmentation, non-specific result    There were no vitals filed for this visit.   Subjective Assessment - 03/18/20 1103    Subjective i'm doing pretty good. I hit my knee yesterday and it has been bothering more.    Currently in Pain? Yes    Pain Score 4     Pain Location Back    Pain  Orientation Lower    Pain Descriptors / Indicators Aching    Pain Type Chronic pain    Pain Score 8    Pain Location Knee    Pain Orientation Right    Pain Descriptors / Indicators Discomfort    Pain Type Chronic pain              [facility] PT Assessment - 03/18/20 0001      Standardized Balance Assessment   Standardized Balance Assessment Berg Balance Test      Berg Balance Test   Sit to Stand Able to stand  independently using hands    Standing Unsupported Able to stand 2 minutes with supervision    Sitting with Back Unsupported but Feet Supported on Floor or Stool Able to sit safely and securely 2 minutes    Stand to Sit Controls descent by using hands    Transfers Able to transfer safely, definite need of hands    Standing Unsupported  with Eyes Closed Able to stand 10 seconds with supervision    Standing Unsupported with Feet Together Able to place feet together independently and stand for 1 minute with supervision    From Standing, Reach Forward with Outstretched Arm Can reach forward >12 cm safely (5")    From Standing Position, Pick up Object from Floor Unable to pick up and needs supervision    From Standing Position, Turn to Look Behind Over each Shoulder Turn sideways only but maintains balance    Turn 360 Degrees Able to turn 360 degrees safely but slowly    Standing Unsupported, Alternately Place Feet on Step/Stool Able to complete >2 steps/needs minimal assist    Standing Unsupported, One Foot in Front Able to take small step independently and hold 30 seconds    Standing on One Leg Tries to lift leg/unable to hold 3 seconds but remains standing independently    Total Score 34                         [facility] Adult PT Treatment/Exercise - 03/18/20 0001      Knee/Hip Exercises: Aerobic   Nustep L2; 5 mins; UE/LE      Knee/Hip Exercises: Supine   Hip Adduction Isometric Both;10 reps    Hip Adduction Isometric Limitations ball sqeeze    Bridges Both;10 reps    Straight Leg Raises Right;Left;10 reps    Other Supine Knee/Hip Exercises Hooklying               Balance Exercises - 03/18/20 0001      Balance Exercises: Standing   Standing Eyes Opened Wide (BOA);3 reps;20 secs;Foam/compliant surface    Standing Eyes Closed Wide (BOA);Solid surface;3 reps;20 secs               PT Short Term Goals - 02/29/20 1127      PT SHORT TERM GOAL #1   Title Pt will be IND with HEP to demonstration functional capacity for ADLs    Time 3    Period Weeks    Status New    Target Date 03/21/20      PT SHORT TERM GOAL #2   Title Pt will increase standing time from 15 minutes to 30 minutes with </=2/10 pain report to increase independence with ADLs    Time 3    Period Weeks    Status New    Target  Date 03/21/20      PT SHORT TERM GOAL #3  Title Pt will be tested on 5xSTS and BERG and educated on results    Time 3    Status New    Target Date 03/21/20             PT Long Term Goals - 02/29/20 1128      PT LONG TERM GOAL #1   Title Pt will be fully IND with all provided HEP to demonstrate ability to maintain functional independence    Time 6    Period Weeks    Status New      PT LONG TERM GOAL #2   Title Pt will decrease FOTO limitation to 49% to demonstrate increased agency and quality of life.    Time 6    Period Weeks    Status New    Target Date 04/11/20      PT LONG TERM GOAL #3   Title Pt will demonstrate increased gross hip and LE strength to >/=4+/5 to improve gait and reduce risk of falls.    Time 6    Period Weeks    Status New    Target Date 04/11/20      PT LONG TERM GOAL #4   Title Pt will reduce manual TUG score by >/=8s to reduce fall risk and improve independence with ADLs    Baseline 29s    Time 6    Period Weeks    Status New    Target Date 04/11/20      PT LONG TERM GOAL #5   Title Pt will improve score on balance assessments including 5xSTS and BERG to demonstrate reduction in fall risk    Time 6    Period Weeks    Status New    Target Date 04/11/20                 Plan - 03/18/20 1114    Clinical Impression Statement Completed Berg and pt scored 34/56 indicating moderate balance deficit. PT was completed to address LE strength and balance activities were initated. Pt will need additional appts to make progress with her deficits. Pt paticipates in PT with good effect.    Personal Factors and Comorbidities Comorbidity 2;Comorbidity 3+    Comorbidities Hx of HTN, OA especially right knee, herniated lumbar disc    Examination-Activity Limitations Stand;Stairs;Squat    Examination-Participation Restrictions Cleaning    Stability/Clinical Decision Making Evolving/Moderate complexity    Clinical Decision Making Moderate    Rehab  Potential Good    PT Frequency 2x / week    PT Duration 6 weeks    PT Treatment/Interventions ADLs/Self Care Home Management;DME Instruction;Moist Heat;Iontophoresis 4mg /ml Dexamethasone;Stair training;Gait training;Functional mobility training;Therapeutic activities;Therapeutic exercise;Balance training;Neuromuscular re-education;Manual techniques;Patient/family education;Dry needling;Energy conservation;Joint Manipulations;Spinal Manipulations;Taping    PT Next Visit Plan Continue balance assessment (BERG, , SLS), practice heel-toe gait, review HEP. Add balance and trunk strengthening exs as indicated    PT Home Exercise Plan - seated hamstring stretch, STS with arms, heel and toe raises (seated), standing hip extension, hip add sets seated, hip abd c red Tband seated    Consulted and Agree with Plan of Care Patient           Patient will benefit from skilled therapeutic intervention in order to improve the following deficits and impairments:  Abnormal gait,Decreased activity tolerance,Decreased balance,Decreased mobility,Decreased endurance,Decreased strength,Difficulty walking,Postural dysfunction,Pain  Visit Diagnosis: Other abnormalities of gait and mobility  Muscle weakness (generalized)  Unsteadiness on feet  Difficulty in walking, not elsewhere classified  Problem List Patient Active Problem List   Diagnosis Date Noted  . Ganglion cyst of dorsum of right wrist 12/15/2018  . Lichen sclerosus of female genitalia 09/25/2018    Class: Chronic  . Hyperlipidemia 03/17/2018  . Lichen planus 03/17/2018  . Numbness and tingling of right leg 10/19/2016  . Shoulder pain 03/01/2016  . Headache 03/01/2016  . Asymmetric SNHL (sensorineural hearing loss) 01/10/2016  . Tinnitus of both ears 01/10/2016  . Hearing loss 10/07/2015  . Thyroid nodule 05/24/2015  . Globus sensation 05/05/2015  . Neck pain 01/07/2015  . Allergic rhinitis 07/19/2014  . GERD  (gastroesophageal reflux disease) 07/19/2014  . Pitting edema 07/19/2014  . Swallowing difficulty 07/06/2013  . Osteoarthritis of right knee 12/26/2012  . GLAUCOMA 11/04/2009  . DDD (degenerative disc disease), lumbar 05/10/2009  . URGE INCONTINENCE 06/01/2008  . Overweight (BMI 25.0-29.9) 05/30/2006  . HYPERTENSION, BENIGN SYSTEMIC 05/30/2006  . Osteoarthritis 05/30/2006    Joellyn Rued MS, PT 03/18/20 1:46 PM  [facility] [address] [place], [place], [address] Phone: [phone]   Fax:  [phone]  Name: LATESSA TILLIS MRN: [id number] Date of Birth: [birth year]

## 2020-03-21 ENCOUNTER — Ambulatory Visit: Payer: Medicare Other | Admitting: Physical Therapy

## 2020-03-21 ENCOUNTER — Other Ambulatory Visit: Payer: Self-pay

## 2020-03-21 ENCOUNTER — Encounter: Payer: Self-pay | Admitting: Physical Therapy

## 2020-03-21 DIAGNOSIS — R262 Difficulty in walking, not elsewhere classified: Secondary | ICD-10-CM | POA: Diagnosis not present

## 2020-03-21 DIAGNOSIS — M6281 Muscle weakness (generalized): Secondary | ICD-10-CM

## 2020-03-21 DIAGNOSIS — R2681 Unsteadiness on feet: Secondary | ICD-10-CM

## 2020-03-21 DIAGNOSIS — R2689 Other abnormalities of gait and mobility: Secondary | ICD-10-CM

## 2020-03-21 NOTE — Therapy (Signed)
[place], [name], [address] Phone: [phone]   Fax:  [phone]  Physical Therapy Treatment  Patient Details  Name: Laura Griffin MRN: [id number] Date of Birth: 07/23/1945 Referring Provider (PT): Leeanne Rio, MD    Encounter Date: 03/21/2020   PT End of Session - 03/21/20 1325    Visit Number 5    Number of Visits 13    Date for PT Re-Evaluation 04/11/20    Authorization Type MCR/[employer]ign[employer] - FOTO at 6th and 10th visit. KX mod at 15th visit.    Progress Note Due on Visit 10    PT Start Time 1320    PT Stop Time 1400    PT Time Calculation (min) 40 min           Past Medical History:  Diagnosis Date  . Allergic rhinitis 07/19/2014  . Arthritis of knee, right 03/11/2006  . Bursitis of shoulder, right 09/19/1999  . Cervical radiculopathy at C6 05/24/2011   Beginning Nov 2012. Multi-level foraminal narrowing in C-spine plain films   . Diverticulosis   . DIVERTICULOSIS OF COLON 05/30/2006   Colonoscopy 06/2014 - multiple diverticula, no other abnormalities, no further colonoscopies recommended given patient's age of 67    . GERD (gastroesophageal reflux disease) 07/19/2014   Diagnosed clinically by [facility] in January of 2016.    . Glaucoma   . Hearing decreased 08/07/2011   With whooshing tinnitus Mild bilateral loss on 08/2012 audiology who will recheck her hearing in one year    . History of herniated intervertebral disc   . Hypertension   . HYPERTENSION, BENIGN SYSTEMIC 05/30/2006       . Left sided sciatica 8/95,11/00   CT confirmed   . LOW BACK PAIN SYNDROME 05/10/2009   Qualifier: Diagnosis of  By: Javier Glazier CMA,, Thekla  Mild C 3-4, 4-5 lumbar spinal stenosis MRI 08/2011, Diffuse lower thoracic and lumbar DDD   . Mitral valve prolapse    per patient heart murmur  . Orthostatic hypotension 06/12/2013  . Osteoarthritis   . Osteoarthritis of right knee 12/26/2012   Confirmed on  xray 07/2014   . OSTEOARTHRITIS, MULTI SITES 05/30/2006   She is stoic about pain and wants to avoid surgeries Past xrays have documented DJD in right knee and lumbar spine    . Overweight (BMI 25.0-29.9) 05/30/2006  . Pitting edema 07/19/2014   Bilateral 1+ pitting edema of calves first noted on 07/19/2014.    . Sciatica 05/30/2006   Qualifier: Diagnosis of  By: Walker Kehr MD, Wayne  Involved left leg in 02/1999   . Swallowing difficulty 07/06/2013  . Urge incontinence 06/01/2008   Qualifier: Diagnosis of  By: Walker Kehr MD, Patrick Jupiter      Past Surgical History:  Procedure Laterality Date  . CATARACT EXTRACTION  02/2011   left eye  . TUBAL LIGATION  1973  . VULVA /PERINEUM BIOPSY  2010   for hypopigmentation, non-specific result    There were no vitals filed for this visit.   Subjective Assessment - 03/21/20 1324    Subjective I am always in pain at least 2/10 in the back. Right knee is bothering me especially after the damp wet and cold weather yesterday. Knee is 5-6/10.    Currently in Pain? Yes    Pain Score 2     Pain Location Back    Pain Orientation Lower    Pain Descriptors / Indicators Aching    Pain Type Chronic pain  Aggravating Factors  liftng, bending, walking and standing too long    Pain Relieving Factors tylenol              [facility] PT Assessment - 03/21/20 0001      Observation/Other Assessments   Focus on Therapeutic Outcomes (FOTO)  49%,function improved from 42%      Standardized Balance Assessment   Five times sit to stand comments  55.5   from mat, with bilat UE                        [facility] Adult PT Treatment/Exercise - 03/21/20 0001      Knee/Hip Exercises: Stretches   Other Knee/Hip Stretches single knee to  chest x 2 each      Knee/Hip Exercises: Aerobic   Nustep L3; 6 mins; UE/LE      Knee/Hip Exercises: Standing   Heel Raises 15 reps    Heel Raises Limitations and toe raises    Hip Flexion Limitations alternating march with bilat UE    Hip  Abduction 10 reps;Right;Left    Hip Extension Right;Left;10 reps;Knee straight      Knee/Hip Exercises: Seated   Sit to Sand 2 sets;5 reps;with UE support   VC for proper techique, CG over feet     Knee/Hip Exercises: Supine   Hip Adduction Isometric Both;10 reps    Hip Adduction Isometric Limitations ball sqeeze    Bridges Both;10 reps    Straight Leg Raises Right;Left;10 reps               Balance Exercises - 03/21/20 0001      Balance Exercises: Standing   Standing Eyes Opened Foam/compliant surface;Narrow base of support (BOS);3 reps;10 secs    Standing Eyes Closed Narrow base of support (BOS);Foam/compliant surface;3 reps;10 secs               PT Short Term Goals - 03/21/20 1352      PT SHORT TERM GOAL #1   Title Pt will be IND with HEP to demonstration functional capacity for ADLs    Baseline reports compliance    Time 3    Period Weeks    Status Achieved      PT SHORT TERM GOAL #2   Title Pt will increase standing time from 15 minutes to 30 minutes with </=2/10 pain report to increase independence with ADLs    Baseline improving    Time 3    Period Weeks    Status On-going      PT SHORT TERM GOAL #3   Title Pt will be tested on 5xSTS and BERG and educated on results    Baseline Both tested    Time 3    Period Weeks    Status Achieved             PT Long Term Goals - 02/29/20 1128      PT LONG TERM GOAL #1   Title Pt will be fully IND with all provided HEP to demonstrate ability to maintain functional independence    Time 6    Period Weeks    Status New      PT LONG TERM GOAL #2   Title Pt will decrease FOTO limitation to 49% to demonstrate increased agency and quality of life.    Time 6    Period Weeks    Status New    Target Date 04/11/20      PT LONG TERM  GOAL #3   Title Pt will demonstrate increased gross hip and LE strength to >/=4+/5 to improve gait and reduce risk of falls.    Time 6    Period Weeks    Status New    Target  Date 04/11/20      PT LONG TERM GOAL #4   Title Pt will reduce manual TUG score by >/=8s to reduce fall risk and improve independence with ADLs    Baseline 29s    Time 6    Period Weeks    Status New    Target Date 04/11/20      PT LONG TERM GOAL #5   Title Pt will improve score on balance assessments including 5xSTS and BERG to demonstrate reduction in fall risk    Time 6    Period Weeks    Status New    Target Date 04/11/20                 Plan - 03/21/20 1405    Clinical Impression Statement Completed 5 x STS and pt completed in 55.5 seconds using bilat UE from mat table. Continued with balance and LE strength. Pt tolerated session well with noted fatigue after repetitions. STG# 1, # 3 met. FOTO score also improved.    PT Next Visit Plan Continue balance assessment (BERG, 5MWT, SLS), practice heel-toe gait, review HEP. Add balance and trunk strengthening exs as indicated    PT Home Exercise Plan [id number] - seated hamstring stretch, STS with arms, heel and toe raises (seated), standing hip extension, hip add sets seated, hip abd c red Tband seated           Patient will benefit from skilled therapeutic intervention in order to improve the following deficits and impairments:     Visit Diagnosis: Other abnormalities of gait and mobility  Muscle weakness (generalized)  Unsteadiness on feet  Difficulty in walking, not elsewhere classified     Problem List Patient Active Problem List   Diagnosis Date Noted  . Ganglion cyst of dorsum of right wrist 12/15/2018  . Lichen sclerosus of female genitalia 09/25/2018    Class: Chronic  . Hyperlipidemia 03/17/2018  . Lichen planus 79/39/0300  . Numbness and tingling of right leg 10/19/2016  . Shoulder pain 03/01/2016  . Headache 03/01/2016  . Asymmetric SNHL (sensorineural hearing loss) 01/10/2016  . Tinnitus of both ears 01/10/2016  . Hearing loss 10/07/2015  . Thyroid nodule 05/24/2015  . Globus sensation  05/05/2015  . Neck pain 01/07/2015  . Allergic rhinitis 07/19/2014  . GERD (gastroesophageal reflux disease) 07/19/2014  . Pitting edema 07/19/2014  . Swallowing difficulty 07/06/2013  . Osteoarthritis of right knee 12/26/2012  . GLAUCOMA 11/04/2009  . DDD (degenerative disc disease), lumbar 05/10/2009  . URGE INCONTINENCE 06/01/2008  . Overweight (BMI 25.0-29.9) 05/30/2006  . HYPERTENSION, BENIGN SYSTEMIC 05/30/2006  . Osteoarthritis 05/30/2006    Dorene Ar, PTA 03/21/2020, 2:07 PM  [facility] [address] [place], [name], [address] Phone: [phone]   Fax:  [phone]  Name: MAHALIA DYKES MRN: [id number] Date of Birth: February 06, 1946

## 2020-03-22 ENCOUNTER — Ambulatory Visit (INDEPENDENT_AMBULATORY_CARE_PROVIDER_SITE_OTHER): Payer: Medicare Other | Admitting: Internal Medicine

## 2020-03-22 ENCOUNTER — Encounter: Payer: Self-pay | Admitting: Internal Medicine

## 2020-03-22 DIAGNOSIS — E041 Nontoxic single thyroid nodule: Secondary | ICD-10-CM

## 2020-03-22 LAB — TSH: TSH: 1.46 u[IU]/mL (ref 0.35–4.50)

## 2020-03-22 NOTE — Progress Notes (Signed)
Patient ID: Laura Griffin, female   DOB: 02-Nov-1945, 74 y.o.   MRN: [id number]   This visit occurred during the SARS-CoV-2 public health emergency.  Safety protocols were in place, including screening questions prior to the visit, additional usage of staff PPE, and extensive cleaning of exam room while observing appropriate contact time as indicated for disinfecting solutions.   HPI  Laura Griffin is a 74 y.o.-year-old female, initially referred by her PCP, Dr. Ardelia Mems, returning for follow-up for hypercalcemia/hyperparathyroidism and thyroid nodule.  Last visit 6 months ago.  Patient was found to have an elevated calcium in approximately 2005 when she was on calcium supplements.  She was advised to stop the supplements and calcium normalized since then until 2019, when her calcium started to return elevated again.    In 05/2019, we stopped multivitamins with calcium.  Subsequent PTH and calcium levels were normal.  I reviewed her pertinent labs: Lab Results  Component Value Date   PTH 40 09/23/2019   PTH 29 06/10/2019   PTH 41 01/13/2019   PTH Comment 01/13/2019   PTH 44.3 04/25/2011   PTH 23.8 08/28/2007   CALCIUM 10.4 09/23/2019   CALCIUM 10.5 06/15/2019   CALCIUM 10.6 (H) 01/13/2019   CALCIUM 10.9 (H) 12/30/2018   CALCIUM 11.4 (H) 12/22/2018   CALCIUM 11.2 (H) 12/15/2018   CALCIUM 10.3 09/02/2017   CALCIUM 10.5 (H) 07/02/2017   CALCIUM 10.4 09/16/2015   CALCIUM 10.2 05/05/2015   Of note, patient does have a high albumin, however, the corrected calcium level was also slightly high: 12/15/2018: Corrected calcium 10.48 (ULN 10.3).  No history of osteoporosis.  Reviewed DXA scan report from 2011: T-scores were normal.  No fractures or falls.  No history of kidney stones.  24-hour urine calcium (02/08/2019) was normal, with a calcium of 91 mg/24 h, however, urine creatinine level was not checked at that time so it was unclear whether this was an appropriate collection or  not.  We checked this again but unfortunately a BMP was not drawn so we could not calculate the fractional excretion of calcium: Component     Latest Ref Rng & Units 06/12/2019  Creatinine, 24H Ur     0.50 - 2.15 g/24 h 0.77  Calcium, 24H Urine     mg/24 h 66   After stopping her calcium supplement (multivitamin), her calcium, PTH, calcitriol levels were normal, as were her phosphorus and magnesium Component     Latest Ref Rng & Units 06/10/2019  Vitamin D 1, 25 (OH) Total     18 - 72 pg/mL 30  Vitamin D3 1, 25 (OH)     pg/mL 30  Vitamin D2 1, 25 (OH)     pg/mL <8  PTH, Intact     15 - 65 pg/mL 29  Phosphorus     2.3 - 4.6 mg/dL 3.1  Magnesium     1.5 - 2.5 mg/dL 1.8   Component     Latest Ref Rng & Units 06/15/2019  Calcium     8.4 - 10.5 mg/dL 10.5   + Mild CKD.  Latest BUN/Cr: Lab Results  Component Value Date   BUN 16 09/23/2019   BUN 13 12/30/2018   CREATININE 1.10 09/23/2019   CREATININE 0.94 12/30/2018   Previously on HCTZ, stopped in 08-12/2018.  No history of vitamin D deficiency: Lab Results  Component Value Date   VD25OH 37.4 01/28/2020   VD25OH 30.8 09/23/2019   VD25OH 31.79 05/26/2019   She was  previously on calcium (multivitamins - 300 mg) + 1000 units vitamin D daily.  We stopped her calcium and I advised her to increase her vitamin D daily dose to 2000 units daily.  She is now on this dose.  She denies a FH of hypercalcemia, pituitary tumors, thyroid cancer, or osteoporosis.   Pt. also has a history of HTN, OA, GERD (not on PPIs), sciatica.  Her mother died of cerebral hemorrhage in her late 76M as a complication of uncontrolled hypertension.  Thyroid nodule: -Subcentimeter -Without worrisome features  Reviewed her thyroid ultrasound report from 2017: Right thyroid lobe : 2.6 cm x 0.8 cm x 1.7 cm. Heterogeneous appearance of the right thyroid tissue without significantly increased flow.  Left thyroid lobe : 3.6 cm x 1.2 cm x 1.3 cm.  Heterogeneous appearance of left-sided thyroid tissue without significantly increased flow. Nodule at the inferior left thyroid measures 8 mm x 6 mm x 7 mm with no internal calcifications.  Isthmus Thickness: 5 mm.  No nodules visualized.  Lymphadenopathy: None visualized.  IMPRESSION: Heterogeneous thyroid with single left-sided nodule.  Findings do not meet current SRU consensus criteria for biopsy. Follow-up by clinical exam is recommended. In a patient of this age with no risk factors for thyroid carcinoma, lengthening the surveillance interval beyond 12 months is a reasonable strategy, as thyroid cancers <2cm are known to be indolent with nearly 100% 10-year survival. If the patient has known risk factors for thyroid carcinoma, a follow-up ultrasound in 12 months is recommended.  Pt denies: - feeling nodules in neck - hoarseness - dysphagia - choking - SOB with lying down  No family history of thyroid cancer.  No history of radiation to head or neck.  Latest TSH: Lab Results  Component Value Date   TSH 1.87 05/26/2019   TSH 1.638 05/05/2015   TSH 0.807 07/06/2013   TSH 1.196 10/03/2010   TSH 1.116 11/11/2007    ROS: Constitutional: no weight gain/no weight loss, no fatigue, no subjective hyperthermia, no subjective hypothermia, + excessive urination Eyes: no blurry vision, no xerophthalmia ENT: no sore throat, + see HPI Cardiovascular: no CP/no SOB/no palpitations/no leg swelling Respiratory: no cough/no SOB/no wheezing Gastrointestinal: no N/no V/no D/no C/no acid reflux Musculoskeletal: + muscle aches/no joint aches Skin: no rashes, no hair loss Neurological: no tremors/no numbness/no tingling/no dizziness  I reviewed pt's medications, allergies, PMH, social hx, family hx, and changes were documented in the history of present illness. Otherwise, unchanged from my initial visit note.  Past Medical History:  Diagnosis Date  . Allergic rhinitis 07/19/2014   . Arthritis of knee, right 03/11/2006  . Bursitis of shoulder, right 09/19/1999  . Cervical radiculopathy at C6 05/24/2011   Beginning Nov 2012. Multi-level foraminal narrowing in C-spine plain films   . Diverticulosis   . DIVERTICULOSIS OF COLON 05/30/2006   Colonoscopy 06/2014 - multiple diverticula, no other abnormalities, no further colonoscopies recommended given patient's age of 62    . GERD (gastroesophageal reflux disease) 07/19/2014   Diagnosed clinically by [facility] in January of 2016.    . Glaucoma   . Hearing decreased 08/07/2011   With whooshing tinnitus Mild bilateral loss on 08/2012 audiology who will recheck her hearing in one year    . History of herniated intervertebral disc   . Hypertension   . HYPERTENSION, BENIGN SYSTEMIC 05/30/2006       . Left sided sciatica 8/95,11/00   CT confirmed   . LOW BACK PAIN SYNDROME 05/10/2009  Qualifier: Diagnosis of  By: Javier Glazier CMA,, Thekla  Mild C 3-4, 4-5 lumbar spinal stenosis MRI 08/2011, Diffuse lower thoracic and lumbar DDD   . Mitral valve prolapse    per patient heart murmur  . Orthostatic hypotension 06/12/2013  . Osteoarthritis   . Osteoarthritis of right knee 12/26/2012   Confirmed on xray 07/2014   . OSTEOARTHRITIS, MULTI SITES 05/30/2006   She is stoic about pain and wants to avoid surgeries Past xrays have documented DJD in right knee and lumbar spine    . Overweight (BMI 25.0-29.9) 05/30/2006  . Pitting edema 07/19/2014   Bilateral 1+ pitting edema of calves first noted on 07/19/2014.    . Sciatica 05/30/2006   Qualifier: Diagnosis of  By: Walker Kehr MD, Wayne  Involved left leg in 02/1999   . Swallowing difficulty 07/06/2013  . Urge incontinence 06/01/2008   Qualifier: Diagnosis of  By: Walker Kehr MD, Patrick Jupiter     Past Surgical History:  Procedure Laterality Date  . CATARACT EXTRACTION  02/2011   left eye  . TUBAL LIGATION  1973  . VULVA /PERINEUM BIOPSY  2010   for hypopigmentation, non-specific result   Social History    Socioeconomic History  . Marital status: Married    Spouse name: Jeneen Rinks  . Number of children: 1  . Years of education: 76  . Highest education level: Master's degree (e.g., MA, MS, MEng, MEd, MSW, MBA)  Occupational History  . Occupation: retired    Fish farm manager: [employer]    Comment: career development  Tobacco Use  . Smoking status: Former Smoker    Packs/day: 0.50    Years: 15.00    Pack years: 7.50    Types: Cigarettes    Quit date: 04/02/1994    Years since quitting: 25.9  . Smokeless tobacco: Never Used  Vaping Use  . Vaping Use: Never used  Substance and Sexual Activity  . Alcohol use: No  . Drug use: No  . Sexual activity: Not Currently    Partners: Male    Birth control/protection: Surgical    Comment: BTSP  Other Topics Concern  . Not on file  Social History Narrative   Patient lives with husband in [place].    Patient has one son and two grandchildren who live nearby.   Patient enjoys seeing her grandchildren grow up and watching their sporting events.    Patient enjoys reading and her book club, crossword puzzles, and shopping.   Husband in remission for multiple myeloma and doing well.          Social Determinants of Health   Financial Resource Strain: Low Risk   . Difficulty of Paying Living Expenses: Not hard at all  Food Insecurity: No Food Insecurity  . Worried About Charity fundraiser in the Last Year: Never true  . Ran Out of Food in the Last Year: Never true  Transportation Needs: No Transportation Needs  . Lack of Transportation (Medical): No  . Lack of Transportation (Non-Medical): No  Physical Activity: Insufficiently Active  . Days of Exercise per Week: 1 day  . Minutes of Exercise per Session: 20 min  Stress: No Stress Concern Present  . Feeling of Stress : Not at all  Social Connections: Moderately Integrated  . Frequency of Communication with Friends and Family: More than three times a week  . Frequency of Social Gatherings  with Friends and Family: More than three times a week  . Attends Religious Services: Never  . Active Member of  Clubs or Organizations: Yes  . Attends Archivist Meetings: More than 4 times per year  . Marital Status: Married  Human resources officer Violence: Not At Risk  . Fear of Current or Ex-Partner: No  . Emotionally Abused: No  . Physically Abused: No  . Sexually Abused: No   Current Outpatient Medications on File Prior to Visit  Medication Sig Dispense Refill  . acetaminophen (TYLENOL) 650 MG CR tablet Take 2 tablets (1,300 mg total) by mouth every 8 (eight) hours as needed. (Patient taking differently: Take 1,300 mg by mouth 3 (three) times daily. ) 90 tablet 1  . amLODipine (NORVASC) 10 MG tablet TAKE 1 TABLET(10 MG) BY MOUTH AT BEDTIME 90 tablet 1  . aspirin EC 81 MG tablet Take 81 mg by mouth daily.    . brimonidine-timolol (COMBIGAN) 0.2-0.5 % ophthalmic solution Place 2 drops into both eyes every 12 (twelve) hours.      . Cholecalciferol (VITAMIN D3) 1000 UNIT capsule Take 1,000 Units by mouth daily.      . clobetasol ointment (TEMOVATE) 0.05 % Apply a thin layer to the vulva twice daily for two weeks if needed for a flare.  Apply to vulva twice a week for maintenance dosing. 60 g 1  . gabapentin (NEURONTIN) 400 MG capsule TAKE 2 CAPSULES(800 MG) BY MOUTH THREE TIMES DAILY 540 capsule 1  . latanoprost (XALATAN) 0.005 % ophthalmic solution PLACE 1 DROP INTO BOTH EYES EVERY EVENING/BEDTIME    . metoprolol succinate (TOPROL-XL) 100 MG 24 hr tablet TAKE 1 TABLET BY MOUTH DAILY WITH OR IMMEDIATELY FOLLOWING A MEAL 90 tablet 1  . Omega-3 Fatty Acids (FISH OIL PO) Take by mouth.     No current facility-administered medications on file prior to visit.   Allergies  Allergen Reactions  . Sulfamethoxazole-Trimethoprim     REACTION: rash: nausea, swelling  . Hctz [Hydrochlorothiazide] Other (See Comments)    Elevated calcium   Family History  Problem Relation Age of Onset  .  Breast cancer Sister        33  . Breast cancer Sister 66  . Hypertension Father   . Heart disease Father   . Stroke Mother 4       cerebral hemorrhage  . Hypertension Mother   . Breast cancer Maternal Aunt     PE: BP 110/70   Pulse (!) 58   Ht '5\' 4"'  (1.626 m)   Wt 178 lb 6.4 oz (80.9 kg)   LMP 04/02/1996   SpO2 98%   BMI 30.62 kg/m  Wt Readings from Last 3 Encounters:  03/22/20 178 lb 6.4 oz (80.9 kg)  02/17/20 174 lb (78.9 kg)  02/09/20 174 lb (78.9 kg)   Constitutional: overweight, in NAD Eyes: PERRLA, EOMI, no exophthalmos ENT: moist mucous membranes, no thyromegaly, no cervical lymphadenopathy Cardiovascular: RRR, No MRG Respiratory: CTA B Gastrointestinal: abdomen soft, NT, ND, BS+ Musculoskeletal: no deformities, strength intact in all 4 Skin: moist, warm, no rashes Neurological: no tremor with outstretched hands, DTR normal in all 4  Assessment: 1. Hypercalcemia/hyperparathyroidism  2.  Thyroid nodule  Plan: Patient with several instances of elevated calcium, with the highest being 11.4.  An intact PTH level was not suppressed, at 41, her calcium was 10.6. -She does not have known history of vitamin D deficiency.  At last visit, this was normal. -She has no apparent complications from hypercalcemia: No history of nephrolithiasis, no osteoporosis, no fractures, no abdominal pain, depression, bone pain. -Possible etiologies for hypercalcemia for  her included: HCTZ (we stopped this), excess use of calcium (we stopped her multivitamin), primary hyperparathyroidism, familial hypercalcemic hypercalciuria, renal insufficiency, etc.  In her case, after stopping her HCTZ and multivitamin, at last visit, her PTH and calcium levels were normal and her GFR was slightly low.  Of note, she also had a normal calcitriol, magnesium, phosphorus, and 24-hour calcium in the past.  At the time of her 24-hour urine collection, unfortunately, BMP was not drawn so I could not calculate  the fractional excretion of calcium, however, my suspicion for [place] was low, since she had numerous instances with normal calcium in the past and also her magnesium level was not elevated. -At this visit, we will recheck an ionizedcalcium level -We reviewed possible consequences of hyperparathyroidism: Approximately 1/3 of patients will develop complications over 15 years (osteoporosis, nephrolithiasis) -If the tests will indicate a parathyroid adenoma in the future, she agrees with the referral to surgery -Reviewed criteria for surgery: . Increased calcium by 1 mg/dL above the upper limit of normal . Kidney ds.  . Osteoporosis (or vertebral fracture) . Age <18 years old Newer criteria (2013): [name] High UCa >400 mg/d and increased stone risk by biochemical stone risk analysis . Presence of nephrolithiasis or nephrocalcinosis . Pt's preference!  -I will see her back in 1 year  2.  Thyroid nodule -No neck compression symptoms -Per review of her latest thyroid ultrasound report from 2017: She has a small thyroid nodule measuring 8 mm in the largest dimension -The nodule is not palpated on physical exam today -She has no risk factors for thyroid cancer -No follow-up needed unless the nodule appears to be growing -Latest TSH was normal in 05/2019-we will repeat a TSH today  Component     Latest Ref Rng & Units 03/22/2020  TSH     0.35 - 4.50 uIU/mL 1.46  Calcium Ionized     4.8 - 5.6 mg/dL 5.5  Normal calcium and TSh >> can return to see me in 1 year. Philemon Kingdom, MD PhD [facility]

## 2020-03-22 NOTE — Patient Instructions (Addendum)
Please stop at the lab.  Continue 2000 units vit D daily.  Please come back for a follow-up appointment in 1 year. 

## 2020-03-23 ENCOUNTER — Other Ambulatory Visit: Payer: Self-pay

## 2020-03-23 ENCOUNTER — Ambulatory Visit: Payer: Medicare Other | Admitting: Physical Therapy

## 2020-03-23 ENCOUNTER — Encounter: Payer: Self-pay | Admitting: Physical Therapy

## 2020-03-23 DIAGNOSIS — R2689 Other abnormalities of gait and mobility: Secondary | ICD-10-CM

## 2020-03-23 DIAGNOSIS — R262 Difficulty in walking, not elsewhere classified: Secondary | ICD-10-CM

## 2020-03-23 DIAGNOSIS — M6281 Muscle weakness (generalized): Secondary | ICD-10-CM

## 2020-03-23 DIAGNOSIS — R2681 Unsteadiness on feet: Secondary | ICD-10-CM

## 2020-03-23 LAB — CALCIUM, IONIZED: Calcium, Ion: 5.5 mg/dL (ref 4.8–5.6)

## 2020-03-23 NOTE — Therapy (Signed)
[facility] [address] [place], [place], [address] Phone: [phone]   Fax:  [phone]  Physical Therapy Treatment  Patient Details  Name: Laura Griffin MRN: [id number] Date of Birth: Sep 28, 1945 Referring Provider (PT): Latrelle Dodrill, MD    Encounter Date: 03/23/2020   PT End of Session - 03/23/20 1115    Visit Number 6    Number of Visits 13    Date for PT Re-Evaluation 04/11/20    Authorization Type MCR/Cigna - FOTO at 6th and 10th visit. KX mod at 15th visit.    Progress Note Due on Visit 10    PT Start Time 1100    PT Stop Time 1140    PT Time Calculation (min) 40 min           Past Medical History:  Diagnosis Date  . Allergic rhinitis 07/19/2014  . Arthritis of knee, right 03/11/2006  . Bursitis of shoulder, right 09/19/1999  . Cervical radiculopathy at C6 05/24/2011   Beginning Nov 2012. Multi-level foraminal narrowing in C-spine plain films   . Diverticulosis   . DIVERTICULOSIS OF COLON 05/30/2006   Colonoscopy 06/2014 - multiple diverticula, no other abnormalities, no further colonoscopies recommended given patient's age of 73    . GERD (gastroesophageal reflux disease) 07/19/2014   Diagnosed clinically by [facility] in January of 2016.    . Glaucoma   . Hearing decreased 08/07/2011   With whooshing tinnitus Mild bilateral loss on 08/2012 audiology who will recheck her hearing in one year    . History of herniated intervertebral disc   . Hypertension   . HYPERTENSION, BENIGN SYSTEMIC 05/30/2006       . Left sided sciatica 8/95,11/00   CT confirmed   . LOW BACK PAIN SYNDROME 05/10/2009   Qualifier: Diagnosis of  By: Lorenda Hatchet CMA,, Thekla  Mild C 3-4, 4-5 lumbar spinal stenosis MRI 08/2011, Diffuse lower thoracic and lumbar DDD   . Mitral valve prolapse    per patient heart murmur  . Orthostatic hypotension 06/12/2013  . Osteoarthritis   . Osteoarthritis of right knee 12/26/2012   Confirmed on  xray 07/2014   . OSTEOARTHRITIS, MULTI SITES 05/30/2006   She is stoic about pain and wants to avoid surgeries Past xrays have documented DJD in right knee and lumbar spine    . Overweight (BMI 25.0-29.9) 05/30/2006  . Pitting edema 07/19/2014   Bilateral 1+ pitting edema of calves first noted on 07/19/2014.    . Sciatica 05/30/2006   Qualifier: Diagnosis of  By: Sheffield Slider MD, Wayne  Involved left leg in 02/1999   . Swallowing difficulty 07/06/2013  . Urge incontinence 06/01/2008   Qualifier: Diagnosis of  By: Sheffield Slider MD, Deniece Portela      Past Surgical History:  Procedure Laterality Date  . CATARACT EXTRACTION  02/2011   left eye  . TUBAL LIGATION  1973  . VULVA /PERINEUM BIOPSY  2010   for hypopigmentation, non-specific result    There were no vitals filed for this visit.   Subjective Assessment - 03/23/20 1105    Subjective I was sore the day after last treatment. I could not do my exercises. The right knee is 6/10 due to the weather. My back has been doing okay 2/10 pain. My back has been less painful since beginning physical therapy.    Pain Score 2     Pain Location Back    Pain Orientation Lower    Pain Descriptors / Indicators Aching  Aggravating Factors  liftng, bending, walking and standing too long    Pain Relieving Factors tylenol    Pain Score 6    Pain Location Knee    Pain Orientation Right    Pain Descriptors / Indicators Discomfort    Aggravating Factors  weather    Pain Relieving Factors tylenol                             [facility] Adult PT Treatment/Exercise - 03/23/20 0001      Knee/Hip Exercises: Stretches   Active Hamstring Stretch Right;Left;2 reps;20 seconds      Knee/Hip Exercises: Standing   Heel Raises 15 reps    Heel Raises Limitations and toe raises    Hip Flexion Limitations alternating march with bilat UE    Hip Abduction 10 reps;Right;Left    Hip Extension Right;Left;10 reps;Knee straight      Knee/Hip Exercises: Seated   Other Seated  Knee/Hip Exercises Hip add set c ball squeeze, 5 sec, 10x    Other Seated Knee/Hip Exercises Hip abd c red Tband, L and R, 10x    Sit to Sand 2 sets;5 reps;with UE support   airex pad on mat table for elevation     Knee/Hip Exercises: Supine   Hip Adduction Isometric Both;10 reps    Hip Adduction Isometric Limitations ball sqeeze    Bridges Both;10 reps    Straight Leg Raises Right;Left;10 reps                    PT Short Term Goals - 03/21/20 1352      PT SHORT TERM GOAL #1   Title Pt will be IND with HEP to demonstration functional capacity for ADLs    Baseline reports compliance    Time 3    Period Weeks    Status Achieved      PT SHORT TERM GOAL #2   Title Pt will increase standing time from 15 minutes to 30 minutes with </=2/10 pain report to increase independence with ADLs    Baseline improving    Time 3    Period Weeks    Status On-going      PT SHORT TERM GOAL #3   Title Pt will be tested on 5xSTS and BERG and educated on results    Baseline Both tested    Time 3    Period Weeks    Status Achieved             PT Long Term Goals - 02/29/20 1128      PT LONG TERM GOAL #1   Title Pt will be fully IND with all provided HEP to demonstrate ability to maintain functional independence    Time 6    Period Weeks    Status New      PT LONG TERM GOAL #2   Title Pt will decrease FOTO limitation to 49% to demonstrate increased agency and quality of life.    Time 6    Period Weeks    Status New    Target Date 04/11/20      PT LONG TERM GOAL #3   Title Pt will demonstrate increased gross hip and LE strength to >/=4+/5 to improve gait and reduce risk of falls.    Time 6    Period Weeks    Status New    Target Date 04/11/20      PT LONG TERM GOAL #4  Title Pt will reduce manual TUG score by >/=8s to reduce fall risk and improve independence with ADLs    Baseline 29s    Time 6    Period Weeks    Status New    Target Date 04/11/20      PT LONG TERM  GOAL #5   Title Pt will improve score on balance assessments including 5xSTS and BERG to demonstrate reduction in fall risk    Time 6    Period Weeks    Status New    Target Date 04/11/20                 Plan - 03/23/20 1112    Clinical Impression Statement Pt reports she was sore after last session in the back of her knees. She felt soreness in this area today when performing hip abduction in standing. Otherwise treatment tolerated well. Worked on controlled descent to mat table from standing and used foam pad to elevate seat. She had some right knee pain.    PT Next Visit Plan Continue balance assessment (BERG, , SLS), practice heel-toe gait, review HEP. Add balance and trunk strengthening exs as indicated    PT Home Exercise Plan [id number] - seated hamstring stretch, STS with arms, heel and toe raises (seated), standing hip extension, hip add sets seated, hip abd c red Tband seated           Patient will benefit from skilled therapeutic intervention in order to improve the following deficits and impairments:  Abnormal gait,Decreased activity tolerance,Decreased balance,Decreased mobility,Decreased endurance,Decreased strength,Difficulty walking,Postural dysfunction,Pain  Visit Diagnosis: Other abnormalities of gait and mobility  Muscle weakness (generalized)  Unsteadiness on feet  Difficulty in walking, not elsewhere classified     Problem List Patient Active Problem List   Diagnosis Date Noted  . Ganglion cyst of dorsum of right wrist 12/15/2018  . Lichen sclerosus of female genitalia 09/25/2018    Class: Chronic  . Hyperlipidemia 03/17/2018  . Lichen planus 03/17/2018  . Numbness and tingling of right leg 10/19/2016  . Shoulder pain 03/01/2016  . Headache 03/01/2016  . Asymmetric SNHL (sensorineural hearing loss) 01/10/2016  . Tinnitus of both ears 01/10/2016  . Hearing loss 10/07/2015  . Thyroid nodule 05/24/2015  . Globus sensation 05/05/2015  . Neck  pain 01/07/2015  . Allergic rhinitis 07/19/2014  . GERD (gastroesophageal reflux disease) 07/19/2014  . Pitting edema 07/19/2014  . Swallowing difficulty 07/06/2013  . Osteoarthritis of right knee 12/26/2012  . GLAUCOMA 11/04/2009  . DDD (degenerative disc disease), lumbar 05/10/2009  . URGE INCONTINENCE 06/01/2008  . Overweight (BMI 25.0-29.9) 05/30/2006  . HYPERTENSION, BENIGN SYSTEMIC 05/30/2006  . Osteoarthritis 05/30/2006    Sherrie Mustache, PTA 03/23/2020, 11:50 AM  [facility] [address] [place], [place], [address] Phone: [phone]   Fax:  [phone]  Name: Laura Griffin MRN: [id number] Date of Birth: [birth year]

## 2020-03-28 ENCOUNTER — Ambulatory Visit: Payer: Medicare Other | Admitting: Physical Therapy

## 2020-03-28 ENCOUNTER — Other Ambulatory Visit: Payer: Self-pay

## 2020-03-28 ENCOUNTER — Encounter: Payer: Self-pay | Admitting: Physical Therapy

## 2020-03-28 DIAGNOSIS — R2681 Unsteadiness on feet: Secondary | ICD-10-CM | POA: Diagnosis not present

## 2020-03-28 DIAGNOSIS — R262 Difficulty in walking, not elsewhere classified: Secondary | ICD-10-CM | POA: Diagnosis not present

## 2020-03-28 DIAGNOSIS — R2689 Other abnormalities of gait and mobility: Secondary | ICD-10-CM

## 2020-03-28 DIAGNOSIS — M6281 Muscle weakness (generalized): Secondary | ICD-10-CM | POA: Diagnosis not present

## 2020-03-28 NOTE — Therapy (Signed)
[facility] [address] [place], [place], [address] Phone: [phone]   Fax:  [phone]  Physical Therapy Treatment  Patient Details  Name: Laura Griffin MRN: [id number] Date of Birth: 09-17-1945 Referring Provider (PT): Latrelle Dodrill, MD    Encounter Date: 03/28/2020   PT End of Session - 03/28/20 1113    Visit Number 7    Number of Visits 13    Date for PT Re-Evaluation 04/11/20    Authorization Type MCR/Cign[employer] - FOTO at 6th and 10th visit. KX mod at 15th visit.    Progress Note Due on Visit 10    PT Start Time 1107    PT Stop Time 1145    PT Time Calculation (min) 38 min    Activity Tolerance Patient tolerated treatment well    Behavior During Therapy [facility] for tasks assessed/performed           Past Medical History:  Diagnosis Date   Allergic rhinitis 07/19/2014   Arthritis of knee, right 03/11/2006   Bursitis of shoulder, right 09/19/1999   Cervical radiculopathy at C6 05/24/2011   Beginning Nov 2012. Multi-level foraminal narrowing in C-spine plain films    Diverticulosis    DIVERTICULOSIS OF COLON 05/30/2006   Colonoscopy 06/2014 - multiple diverticula, no other abnormalities, no further colonoscopies recommended given patient's age of 70     GERD (gastroesophageal reflux disease) 07/19/2014   Diagnosed clinically by [facility] in January of 2016.     Glaucoma    Hearing decreased 08/07/2011   With whooshing tinnitus Mild bilateral loss on 08/2012 audiology who will recheck her hearing in one year     History of herniated intervertebral disc    Hypertension    HYPERTENSION, BENIGN SYSTEMIC 05/30/2006        Left sided sciatica 8/95,11/00   CT confirmed    LOW BACK PAIN SYNDROME 05/10/2009   Qualifier: Diagnosis of  By: Lorenda Hatchet CMA,, Thekla  Mild C 3-4, 4-5 lumbar spinal stenosis MRI 08/2011, Diffuse lower thoracic and lumbar DDD    Mitral valve prolapse    per patient heart murmur    Orthostatic hypotension 06/12/2013   Osteoarthritis    Osteoarthritis of right knee 12/26/2012   Confirmed on xray 07/2014    OSTEOARTHRITIS, MULTI SITES 05/30/2006   She is stoic about pain and wants to avoid surgeries Past xrays have documented DJD in right knee and lumbar spine     Overweight (BMI 25.0-29.9) 05/30/2006   Pitting edema 07/19/2014   Bilateral 1+ pitting edema of calves first noted on 07/19/2014.     Sciatica 05/30/2006   Qualifier: Diagnosis of  By: Sheffield Slider MD, Wayne  Involved left leg in 02/1999    Swallowing difficulty 07/06/2013   Urge incontinence 06/01/2008   Qualifier: Diagnosis of  By: Sheffield Slider MD, Deniece Portela      Past Surgical History:  Procedure Laterality Date   CATARACT EXTRACTION  02/2011   left eye   TUBAL LIGATION  1973   VULVA /PERINEUM BIOPSY  2010   for hypopigmentation, non-specific result    There were no vitals filed for this visit.   Subjective Assessment - 03/28/20 1111    Subjective "I feel like I am getting more stable, and my confidnence is getting better. My knee fluctuates and my back does too."    Patient Stated Goals Better balance control, feel more comfortable and confident walking, reduced tentative walking pattern    Currently in Pain? Yes  Pain Score 1     Pain Location Back    Pain Orientation Right;Lower    Pain Type Chronic pain    Pain Onset More than a month ago    Pain Frequency Intermittent    Pain Orientation Right              [facility] PT Assessment - 03/28/20 0001      Assessment   Medical Diagnosis Gait instability R26.81    Referring Provider (PT) Latrelle Dodrill, MD                          [facility] Adult PT Treatment/Exercise - 03/28/20 0001      Knee/Hip Exercises: Stretches   Active Hamstring Stretch 2 reps;30 seconds   seated     Knee/Hip Exercises: Standing   Hip Abduction 2 sets;10 reps    Hip Extension Both;2 sets;10 reps    Other Standing Knee Exercises marching in place 1 x 10  alternating L/R with bil UE HHA, and 1 x 10 with RUE HHA For stability      Knee/Hip Exercises: Seated   Long Arc Quad 5 reps;1 set   cues for proper form, scooting to the end of the chair ball between to prevent knees from dropping in                 PT Education - 03/28/20 1143    Education Details sit to stand biomechanic, scooting forward to edge of seat, bending knees, rock forward (nose ove rtoes, and stand)    Person(s) Educated Patient    Methods Explanation;Verbal cues    Comprehension Verbalized understanding;Verbal cues required            PT Short Term Goals - 03/21/20 1352      PT SHORT TERM GOAL #1   Title Pt will be IND with HEP to demonstration functional capacity for ADLs    Baseline reports compliance    Time 3    Period Weeks    Status Achieved      PT SHORT TERM GOAL #2   Title Pt will increase standing time from 15 minutes to 30 minutes with </=2/10 pain report to increase independence with ADLs    Baseline improving    Time 3    Period Weeks    Status On-going      PT SHORT TERM GOAL #3   Title Pt will be tested on 5xSTS and BERG and educated on results    Baseline Both tested    Time 3    Period Weeks    Status Achieved             PT Long Term Goals - 02/29/20 1128      PT LONG TERM GOAL #1   Title Pt will be fully IND with all provided HEP to demonstrate ability to maintain functional independence    Time 6    Period Weeks    Status New      PT LONG TERM GOAL #2   Title Pt will decrease FOTO limitation to 49% to demonstrate increased agency and quality of life.    Time 6    Period Weeks    Status New    Target Date 04/11/20      PT LONG TERM GOAL #3   Title Pt will demonstrate increased gross hip and LE strength to >/=4+/5 to improve gait and reduce risk of falls.  Time 6    Period Weeks    Status New    Target Date 04/11/20      PT LONG TERM GOAL #4   Title Pt will reduce manual TUG score by >/=8s to reduce fall  risk and improve independence with ADLs    Baseline 29s    Time 6    Period Weeks    Status New    Target Date 04/11/20      PT LONG TERM GOAL #5   Title Pt will improve score on balance assessments including 5xSTS and BERG to demonstrate reduction in fall risk    Time 6    Period Weeks    Status New    Target Date 04/11/20                 Plan - 03/28/20 1144    Clinical Impression Statement pt reports since starting PT her back and knees have been doing better, but she continues to report increased pain / difficulty with sitting for long periods of time. Continued working on hip/ knee strengthening in standing to work on [employer] and sit to stand biomechanics, which demosntration and cues was required as well as ball between knees to reduce valgus positioning. she reported no changes in pain but did note feeling fatgiued end of session.    PT Treatment/Interventions ADLs/Self Care Home Management;DME Instruction;Moist Heat;Iontophoresis 4mg /ml Dexamethasone;Stair training;Gait training;Functional mobility training;Therapeutic activities;Therapeutic exercise;Balance training;Neuromuscular re-education;Manual techniques;Patient/family education;Dry needling;Energy conservation;Joint Manipulations;Spinal Manipulations;Taping    PT Next Visit Plan Continue balance assessment (BERG, , SLS), practice heel-toe gait, review HEP. Add balance and trunk strengthening exs as indicated    PT Home Exercise Plan - seated hamstring stretch, STS with arms, heel and toe raises (seated), standing hip extension, hip add sets seated, hip abd c red Tband seated           Patient will benefit from skilled therapeutic intervention in order to improve the following deficits and impairments:  Abnormal gait,Decreased activity tolerance,Decreased balance,Decreased mobility,Decreased endurance,Decreased strength,Difficulty walking,Postural dysfunction,Pain  Visit Diagnosis: Other  abnormalities of gait and mobility  Muscle weakness (generalized)  Unsteadiness on feet  Difficulty in walking, not elsewhere classified     Problem List Patient Active Problem List   Diagnosis Date Noted   Ganglion cyst of dorsum of right wrist 12/15/2018   Lichen sclerosus of female genitalia 09/25/2018    Class: Chronic   Hyperlipidemia 03/17/2018   Lichen planus 03/17/2018   Numbness and tingling of right leg 10/19/2016   Shoulder pain 03/01/2016   Headache 03/01/2016   Asymmetric SNHL (sensorineural hearing loss) 01/10/2016   Tinnitus of both ears 01/10/2016   Hearing loss 10/07/2015   Thyroid nodule 05/24/2015   Globus sensation 05/05/2015   Neck pain 01/07/2015   Allergic rhinitis 07/19/2014   GERD (gastroesophageal reflux disease) 07/19/2014   Pitting edema 07/19/2014   Swallowing difficulty 07/06/2013   Osteoarthritis of right knee 12/26/2012   GLAUCOMA 11/04/2009   DDD (degenerative disc disease), lumbar 05/10/2009   URGE INCONTINENCE 06/01/2008   Overweight (BMI 25.0-29.9) 05/30/2006   HYPERTENSION, BENIGN SYSTEMIC 05/30/2006   Osteoarthritis 05/30/2006   06/01/2006 PT, DPT, LAT, ATC  03/28/20  11:47 AM      [facility] [address] [place], [place], [place] Phone: [phone]   Fax:  [phone]  Name: Laura Griffin MRN: Carie Caddy Date of Birth: [birth year]

## 2020-03-29 ENCOUNTER — Telehealth: Payer: Self-pay | Admitting: *Deleted

## 2020-03-29 NOTE — Telephone Encounter (Signed)
Call to patient, no answer. Voicemail not available.   Call to notify of PA approval for clobetasol propionate 0.05% ointment.  Approved 02/10/20 -03/11/21

## 2020-03-30 ENCOUNTER — Ambulatory Visit: Payer: Medicare Other | Admitting: Physical Therapy

## 2020-03-30 ENCOUNTER — Encounter: Payer: Self-pay | Admitting: Physical Therapy

## 2020-03-30 ENCOUNTER — Other Ambulatory Visit: Payer: Self-pay

## 2020-03-30 DIAGNOSIS — R2689 Other abnormalities of gait and mobility: Secondary | ICD-10-CM | POA: Diagnosis not present

## 2020-03-30 DIAGNOSIS — R2681 Unsteadiness on feet: Secondary | ICD-10-CM | POA: Diagnosis not present

## 2020-03-30 DIAGNOSIS — M6281 Muscle weakness (generalized): Secondary | ICD-10-CM | POA: Diagnosis not present

## 2020-03-30 DIAGNOSIS — R262 Difficulty in walking, not elsewhere classified: Secondary | ICD-10-CM

## 2020-03-30 NOTE — Therapy (Signed)
[facility] Outpatient Rehabilitation [facility] [address] [place], [place], [address] Phone: [phone]   Fax:  [phone]  Physical Therapy Treatment  Patient Details  Name: Laura Griffin MRN: [id number] Date of Birth: April 08, 1945 Referring Provider (PT): Latrelle Dodrill, MD    Encounter Date: 03/30/2020   PT End of Session - 03/30/20 1108    Visit Number 8    Number of Visits 13    Date for PT Re-Evaluation 04/11/20    Authorization Type MCR/Cigna - FOTO at 6th and 10th visit. KX mod at 15th visit.    PT Start Time 1102    PT Stop Time 1142    PT Time Calculation (min) 40 min           Past Medical History:  Diagnosis Date  . Allergic rhinitis 07/19/2014  . Arthritis of knee, right 03/11/2006  . Bursitis of shoulder, right 09/19/1999  . Cervical radiculopathy at C6 05/24/2011   Beginning Nov 2012. Multi-level foraminal narrowing in C-spine plain films   . Diverticulosis   . DIVERTICULOSIS OF COLON 05/30/2006   Colonoscopy 06/2014 - multiple diverticula, no other abnormalities, no further colonoscopies recommended given patient's age of 77    . GERD (gastroesophageal reflux disease) 07/19/2014   Diagnosed clinically by [facility] in January of 2016.    . Glaucoma   . Hearing decreased 08/07/2011   With whooshing tinnitus Mild bilateral loss on 08/2012 audiology who will recheck her hearing in one year    . History of herniated intervertebral disc   . Hypertension   . HYPERTENSION, BENIGN SYSTEMIC 05/30/2006       . Left sided sciatica 8/95,11/00   CT confirmed   . LOW BACK PAIN SYNDROME 05/10/2009   Qualifier: Diagnosis of  By: Lorenda Hatchet CMA,, Thekla  Mild C 3-4, 4-5 lumbar spinal stenosis MRI 08/2011, Diffuse lower thoracic and lumbar DDD   . Mitral valve prolapse    per patient heart murmur  . Orthostatic hypotension 06/12/2013  . Osteoarthritis   . Osteoarthritis of right knee 12/26/2012   Confirmed on xray 07/2014   . OSTEOARTHRITIS,  MULTI SITES 05/30/2006   She is stoic about pain and wants to avoid surgeries Past xrays have documented DJD in right knee and lumbar spine    . Overweight (BMI 25.0-29.9) 05/30/2006  . Pitting edema 07/19/2014   Bilateral 1+ pitting edema of calves first noted on 07/19/2014.    . Sciatica 05/30/2006   Qualifier: Diagnosis of  By: Sheffield Slider MD, Wayne  Involved left leg in 02/1999   . Swallowing difficulty 07/06/2013  . Urge incontinence 06/01/2008   Qualifier: Diagnosis of  By: Sheffield Slider MD, Deniece Portela      Past Surgical History:  Procedure Laterality Date  . CATARACT EXTRACTION  02/2011   left eye  . TUBAL LIGATION  1973  . VULVA /PERINEUM BIOPSY  2010   for hypopigmentation, non-specific result    There were no vitals filed for this visit.   Subjective Assessment - 03/30/20 1107    Subjective Pt reports she is feeling a difference and she is feeling more secure on feet and less stumbling. The right knee is hurting more today with the change in weather. My back is a 5/10, right knee is 8/10.    Currently in Pain? Yes    Pain Score 5     Pain Location Back    Pain Score 8    Pain Location Knee    Pain Orientation Right  [facility] Adult PT Treatment/Exercise - 03/30/20 0001      Knee/Hip Exercises: Standing   Heel Raises 15 reps    Heel Raises Limitations and toe raises    Hip Flexion Limitations alternating march x 20 1 UE light touch   light touch     Knee/Hip Exercises: Seated   Long Arc Quad 10 reps    Long Arc Quad Limitations ball squeeze    Other Seated Knee/Hip Exercises Hip add set c ball squeeze, 5 sec, 10x    Other Seated Knee/Hip Exercises Hip abd  green  red Tband, L and R, 10x    Sit to Sand 2 sets;5 reps;with UE support   airex pad on mat table for elevation     Knee/Hip Exercises: Supine   Bridges 15 reps    Straight Leg Raises Right;Left;15 reps               Balance Exercises - 03/30/20 0001      Balance Exercises:  Standing   Standing Eyes Opened Foam/compliant surface;Narrow base of support (BOS);3 reps;10 secs    Standing Eyes Closed Narrow base of support (BOS);Foam/compliant surface;3 reps;10 secs;Head turns               PT Short Term Goals - 03/21/20 1352      PT SHORT TERM GOAL #1   Title Pt will be IND with HEP to demonstration functional capacity for ADLs    Baseline reports compliance    Time 3    Period Weeks    Status Achieved      PT SHORT TERM GOAL #2   Title Pt will increase standing time from 15 minutes to 30 minutes with </=2/10 pain report to increase independence with ADLs    Baseline improving    Time 3    Period Weeks    Status On-going      PT SHORT TERM GOAL #3   Title Pt will be tested on 5xSTS and BERG and educated on results    Baseline Both tested    Time 3    Period Weeks    Status Achieved             PT Long Term Goals - 02/29/20 1128      PT LONG TERM GOAL #1   Title Pt will be fully IND with all provided HEP to demonstrate ability to maintain functional independence    Time 6    Period Weeks    Status New      PT LONG TERM GOAL #2   Title Pt will decrease FOTO limitation to 49% to demonstrate increased agency and quality of life.    Time 6    Period Weeks    Status New    Target Date 04/11/20      PT LONG TERM GOAL #3   Title Pt will demonstrate increased gross hip and LE strength to >/=4+/5 to improve gait and reduce risk of falls.    Time 6    Period Weeks    Status New    Target Date 04/11/20      PT LONG TERM GOAL #4   Title Pt will reduce manual TUG score by >/=8s to reduce fall risk and improve independence with ADLs    Baseline 29s    Time 6    Period Weeks    Status New    Target Date 04/11/20      PT LONG TERM GOAL #5  Title Pt will improve score on balance assessments including 5xSTS and BERG to demonstrate reduction in fall risk    Time 6    Period Weeks    Status New    Target Date 04/11/20                  Plan - 03/30/20 1213    Clinical Impression Statement Pt reports she continues to feel more confident on her feet in regard to balance. She is in more pain today that she attributes to the weather but is agreeable to treatment. Continued with LE strength and balance. She is able to control descent to mat table from standing but with increased right knee pain.    PT Next Visit Plan Continue balance assessment (BERG, , SLS), practice heel-toe gait, review HEP. Add balance and trunk strengthening exs as indicated    PT Home Exercise Plan [id number] - seated hamstring stretch, STS with arms, heel and toe raises (seated), standing hip extension, hip add sets seated, hip abd c red Tband seated    Consulted and Agree with Plan of Care Patient           Patient will benefit from skilled therapeutic intervention in order to improve the following deficits and impairments:  Abnormal gait,Decreased activity tolerance,Decreased balance,Decreased mobility,Decreased endurance,Decreased strength,Difficulty walking,Postural dysfunction,Pain  Visit Diagnosis: Other abnormalities of gait and mobility  Muscle weakness (generalized)  Difficulty in walking, not elsewhere classified  Unsteadiness on feet     Problem List Patient Active Problem List   Diagnosis Date Noted  . Ganglion cyst of dorsum of right wrist 12/15/2018  . Lichen sclerosus of female genitalia 09/25/2018    Class: Chronic  . Hyperlipidemia 03/17/2018  . Lichen planus 03/17/2018  . Numbness and tingling of right leg 10/19/2016  . Shoulder pain 03/01/2016  . Headache 03/01/2016  . Asymmetric SNHL (sensorineural hearing loss) 01/10/2016  . Tinnitus of both ears 01/10/2016  . Hearing loss 10/07/2015  . Thyroid nodule 05/24/2015  . Globus sensation 05/05/2015  . Neck pain 01/07/2015  . Allergic rhinitis 07/19/2014  . GERD (gastroesophageal reflux disease) 07/19/2014  . Pitting edema 07/19/2014  . Swallowing  difficulty 07/06/2013  . Osteoarthritis of right knee 12/26/2012  . GLAUCOMA 11/04/2009  . DDD (degenerative disc disease), lumbar 05/10/2009  . URGE INCONTINENCE 06/01/2008  . Overweight (BMI 25.0-29.9) 05/30/2006  . HYPERTENSION, BENIGN SYSTEMIC 05/30/2006  . Osteoarthritis 05/30/2006    Sherrie Mustache, PTA 03/30/2020, 12:17 PM  [facility] [address] [place], [place], [address] Phone: [phone]   Fax:  [phone]  Name: Laura Griffin MRN: [id number] Date of Birth: 1945-07-25

## 2020-04-06 ENCOUNTER — Other Ambulatory Visit: Payer: Self-pay

## 2020-04-06 ENCOUNTER — Encounter: Payer: Self-pay | Admitting: Physical Therapy

## 2020-04-06 ENCOUNTER — Ambulatory Visit: Payer: Medicare Other | Attending: Family Medicine | Admitting: Physical Therapy

## 2020-04-06 DIAGNOSIS — R2681 Unsteadiness on feet: Secondary | ICD-10-CM | POA: Diagnosis not present

## 2020-04-06 DIAGNOSIS — R2689 Other abnormalities of gait and mobility: Secondary | ICD-10-CM

## 2020-04-06 DIAGNOSIS — M6281 Muscle weakness (generalized): Secondary | ICD-10-CM | POA: Diagnosis not present

## 2020-04-06 DIAGNOSIS — R262 Difficulty in walking, not elsewhere classified: Secondary | ICD-10-CM

## 2020-04-06 NOTE — Therapy (Signed)
[facility] [address] [place], [place], [address] Phone: [phone]   Fax:  [phone]  Physical Therapy Treatment  Patient Details  Name: Laura Griffin MRN: [id number] Date of Birth: [birth year] Referring Provider (PT): Latrelle Dodrill, MD    Encounter Date: 04/06/2020   PT End of Session - 04/06/20 1028    Visit Number 9    Number of Visits 13    Date for PT Re-Evaluation 04/11/20    Authorization Type MCR/[employer] - FOTO at 6th and 10th visit. KX mod at 15th visit.    PT Start Time 1020    PT Stop Time 1058    PT Time Calculation (min) 38 min           Past Medical History:  Diagnosis Date  . Allergic rhinitis 07/19/2014  . Arthritis of knee, right 03/11/2006  . Bursitis of shoulder, right 09/19/1999  . Cervical radiculopathy at C6 05/24/2011   Beginning Nov 2012. Multi-level foraminal narrowing in C-spine plain films   . Diverticulosis   . DIVERTICULOSIS OF COLON 05/30/2006   Colonoscopy 06/2014 - multiple diverticula, no other abnormalities, no further colonoscopies recommended given patient's age of 34    . GERD (gastroesophageal reflux disease) 07/19/2014   Diagnosed clinically by [facility] in January of 2016.    . Glaucoma   . Hearing decreased 08/07/2011   With whooshing tinnitus Mild bilateral loss on 08/2012 audiology who will recheck her hearing in one year    . History of herniated intervertebral disc   . Hypertension   . HYPERTENSION, BENIGN SYSTEMIC 05/30/2006       . Left sided sciatica 8/95,11/00   CT confirmed   . LOW BACK PAIN SYNDROME 05/10/2009   Qualifier: Diagnosis of  By: Lorenda Hatchet CMA,, Thekla  Mild C 3-4, 4-5 lumbar spinal stenosis MRI 08/2011, Diffuse lower thoracic and lumbar DDD   . Mitral valve prolapse    per patient heart murmur  . Orthostatic hypotension 06/12/2013  . Osteoarthritis   . Osteoarthritis of right knee 12/26/2012   Confirmed on xray 07/2014   . OSTEOARTHRITIS,  MULTI SITES 05/30/2006   She is stoic about pain and wants to avoid surgeries Past xrays have documented DJD in right knee and lumbar spine    . Overweight (BMI 25.0-29.9) 05/30/2006  . Pitting edema 07/19/2014   Bilateral 1+ pitting edema of calves first noted on 07/19/2014.    . Sciatica 05/30/2006   Qualifier: Diagnosis of  By: Sheffield Slider MD, Wayne  Involved left leg in 02/1999   . Swallowing difficulty 07/06/2013  . Urge incontinence 06/01/2008   Qualifier: Diagnosis of  By: Sheffield Slider MD, Deniece Portela      Past Surgical History:  Procedure Laterality Date  . CATARACT EXTRACTION  02/2011   left eye  . TUBAL LIGATION  1973  . VULVA /PERINEUM BIOPSY  2010   for hypopigmentation, non-specific result    There were no vitals filed for this visit.   Subjective Assessment - 04/06/20 1025    Subjective Pt arrives with SPC and reports increased pain in the right knee  with the weather changes.    Currently in Pain? Yes    Pain Score 2     Pain Location Back    Pain Orientation Lower;Mid    Pain Descriptors / Indicators Dull    Pain Type Chronic pain    Aggravating Factors  constant, standing    Pain Relieving Factors tylenol    Pain Score  8    Pain Location Knee    Pain Orientation Right    Pain Descriptors / Indicators Aching;Discomfort;Throbbing    Pain Type Chronic pain    Aggravating Factors  weather, constant    Pain Relieving Factors tylenol              [facility] PT Assessment - 04/06/20 0001      Standardized Balance Assessment   Five times sit to stand comments  37   from mat with bilateral UE                        [facility] Adult PT Treatment/Exercise - 04/06/20 0001      Lumbar Exercises: Seated   Long Arc Quad on Chair 20 reps    Other Seated Lumbar Exercises marching x20      Knee/Hip Exercises: Standing   Heel Raises 15 reps    Heel Raises Limitations and toe raises    Hip Flexion Limitations alternating march x 20 1 UE light touch   light touch               Balance Exercises - 04/06/20 0001      Balance Exercises: Standing   Standing Eyes Opened Foam/compliant surface;Narrow base of support (BOS);3 reps;10 secs;Head turns    Standing Eyes Closed Narrow base of support (BOS);Foam/compliant surface;3 reps;10 secs    Tandem Stance Eyes open;Upper extremity support 1    Rockerboard EO;Anterior/posterior;UE support   blue              PT Short Term Goals - 03/21/20 1352      PT SHORT TERM GOAL #1   Title Pt will be IND with HEP to demonstration functional capacity for ADLs    Baseline reports compliance    Time 3    Period Weeks    Status Achieved      PT SHORT TERM GOAL #2   Title Pt will increase standing time from 15 minutes to 30 minutes with </=2/10 pain report to increase independence with ADLs    Baseline improving    Time 3    Period Weeks    Status On-going      PT SHORT TERM GOAL #3   Title Pt will be tested on 5xSTS and BERG and educated on results    Baseline Both tested    Time 3    Period Weeks    Status Achieved             PT Long Term Goals - 02/29/20 1128      PT LONG TERM GOAL #1   Title Pt will be fully IND with all provided HEP to demonstrate ability to maintain functional independence    Time 6    Period Weeks    Status New      PT LONG TERM GOAL #2   Title Pt will decrease FOTO limitation to 49% to demonstrate increased agency and quality of life.    Time 6    Period Weeks    Status New    Target Date 04/11/20      PT LONG TERM GOAL #3   Title Pt will demonstrate increased gross hip and LE strength to >/=4+/5 to improve gait and reduce risk of falls.    Time 6    Period Weeks    Status New    Target Date 04/11/20      PT LONG TERM GOAL #4  Title Pt will reduce manual TUG score by >/=8s to reduce fall risk and improve independence with ADLs    Baseline 29s    Time 6    Period Weeks    Status New    Target Date 04/11/20      PT LONG TERM GOAL #5   Title Pt will improve score on  balance assessments including 5xSTS and BERG to demonstrate reduction in fall risk    Time 6    Period Weeks    Status New    Target Date 04/11/20                 Plan - 04/06/20 1247    Clinical Impression Statement Pt arrived with [facility] and stumbled x 2 while ambulating into gym, no fall. She reports no falls at home. 5 x STS improved from 55 sec to 37 sec. She    PT Next Visit Plan Needs ERO to continue PT           Patient will benefit from skilled therapeutic intervention in order to improve the following deficits and impairments:  Abnormal gait,Decreased activity tolerance,Decreased balance,Decreased mobility,Decreased endurance,Decreased strength,Difficulty walking,Postural dysfunction,Pain  Visit Diagnosis: Other abnormalities of gait and mobility  Muscle weakness (generalized)  Difficulty in walking, not elsewhere classified  Unsteadiness on feet     Problem List Patient Active Problem List   Diagnosis Date Noted  . Ganglion cyst of dorsum of right wrist 12/15/2018  . Lichen sclerosus of female genitalia 09/25/2018    Class: Chronic  . Hyperlipidemia 03/17/2018  . Lichen planus 03/17/2018  . Numbness and tingling of right leg 10/19/2016  . Shoulder pain 03/01/2016  . Headache 03/01/2016  . Asymmetric SNHL (sensorineural hearing loss) 01/10/2016  . Tinnitus of both ears 01/10/2016  . Hearing loss 10/07/2015  . Thyroid nodule 05/24/2015  . Globus sensation 05/05/2015  . Neck pain 01/07/2015  . Allergic rhinitis 07/19/2014  . GERD (gastroesophageal reflux disease) 07/19/2014  . Pitting edema 07/19/2014  . Swallowing difficulty 07/06/2013  . Osteoarthritis of right knee 12/26/2012  . GLAUCOMA 11/04/2009  . DDD (degenerative disc disease), lumbar 05/10/2009  . URGE INCONTINENCE 06/01/2008  . Overweight (BMI 25.0-29.9) 05/30/2006  . HYPERTENSION, BENIGN SYSTEMIC 05/30/2006  . Osteoarthritis 05/30/2006    Sherrie Mustache, PTA 04/06/2020, 1:24  PM  [facility] [address] [place], [place], [address] Phone: [phone]   Fax:  [phone]  Name: SHAYNE DEERMAN MRN: [id number] Date of Birth: 05/30/1945

## 2020-04-08 ENCOUNTER — Other Ambulatory Visit: Payer: Self-pay

## 2020-04-08 ENCOUNTER — Ambulatory Visit: Payer: Medicare Other

## 2020-04-08 DIAGNOSIS — R262 Difficulty in walking, not elsewhere classified: Secondary | ICD-10-CM | POA: Diagnosis not present

## 2020-04-08 DIAGNOSIS — R2689 Other abnormalities of gait and mobility: Secondary | ICD-10-CM | POA: Diagnosis not present

## 2020-04-08 DIAGNOSIS — R2681 Unsteadiness on feet: Secondary | ICD-10-CM

## 2020-04-08 DIAGNOSIS — M6281 Muscle weakness (generalized): Secondary | ICD-10-CM | POA: Diagnosis not present

## 2020-04-09 NOTE — Therapy (Signed)
[address] [place], [name], [address] Phone: [phone]   Fax:  [phone]  Physical Therapy Treatment/Progress Note/Re-Certification  Patient Details  Name: Laura Griffin MRN: [id number] Date of Birth: 09/21/1945 Referring Provider (PT): Leeanne Rio, MD   Progress Note Reporting Period 02/28/20 to 04/08/20  See note below for Objective Data and Assessment of Progress/Goals.       Encounter Date: 04/08/2020   PT End of Session - 04/08/20 1212    Visit Number 10    Number of Visits 16    Date for PT Re-Evaluation 05/20/20    Authorization Type MCR/Cign[employer] - FOTO at 6th and 10th visit. KX mod at 15th visit.    Progress Note Due on Visit 10    PT Start Time 1140    PT Stop Time 1224    PT Time Calculation (min) 44 min    Equipment Utilized During Treatment Other (comment)   SPC   Activity Tolerance Patient tolerated treatment well    Behavior During Therapy WFL for tasks assessed/performed           Past Medical History:  Diagnosis Date  . Allergic rhinitis 07/19/2014  . Arthritis of knee, right 03/11/2006  . Bursitis of shoulder, right 09/19/1999  . Cervical radiculopathy at C6 05/24/2011   Beginning Nov 2012. Multi-level foraminal narrowing in C-spine plain films   . Diverticulosis   . DIVERTICULOSIS OF COLON 05/30/2006   Colonoscopy 06/2014 - multiple diverticula, no other abnormalities, no further colonoscopies recommended given patient's age of 28    . GERD (gastroesophageal reflux disease) 07/19/2014   Diagnosed clinically by [facility] in January of 2016.    . Glaucoma   . Hearing decreased 08/07/2011   With whooshing tinnitus Mild bilateral loss on 08/2012 audiology who will recheck her hearing in one year    . History of herniated intervertebral disc   . Hypertension   . HYPERTENSION, BENIGN SYSTEMIC 05/30/2006       . Left sided sciatica 8/95,11/00   CT confirmed   . LOW  BACK PAIN SYNDROME 05/10/2009   Qualifier: Diagnosis of  By: Javier Glazier CMA,, Thekla  Mild C 3-4, 4-5 lumbar spinal stenosis MRI 08/2011, Diffuse lower thoracic and lumbar DDD   . Mitral valve prolapse    per patient heart murmur  . Orthostatic hypotension 06/12/2013  . Osteoarthritis   . Osteoarthritis of right knee 12/26/2012   Confirmed on xray 07/2014   . OSTEOARTHRITIS, MULTI SITES 05/30/2006   She is stoic about pain and wants to avoid surgeries Past xrays have documented DJD in right knee and lumbar spine    . Overweight (BMI 25.0-29.9) 05/30/2006  . Pitting edema 07/19/2014   Bilateral 1+ pitting edema of calves first noted on 07/19/2014.    . Sciatica 05/30/2006   Qualifier: Diagnosis of  By: Walker Kehr MD, Wayne  Involved left leg in 02/1999   . Swallowing difficulty 07/06/2013  . Urge incontinence 06/01/2008   Qualifier: Diagnosis of  By: Walker Kehr MD, Patrick Jupiter      Past Surgical History:  Procedure Laterality Date  . CATARACT EXTRACTION  02/2011   left eye  . TUBAL LIGATION  1973  . VULVA /PERINEUM BIOPSY  2010   for hypopigmentation, non-specific result    There were no vitals filed for this visit.   Subjective Assessment - 04/08/20 1144    Subjective Moving better, but not as good as she would like. Confidence in my balance is  improving.    Currently in Pain? Yes    Pain Score 1     Pain Location Back    Pain Orientation Lower;Mid    Pain Descriptors / Indicators Dull    Pain Type Chronic pain    Pain Onset More than a month ago    Pain Frequency Intermittent    Aggravating Factors  standing    Pain Relieving Factors tylenol    Effect of Pain on Daily Activities Reduced time standing and walking    Pain Score 6    Pain Location Knee    Pain Orientation Right    Pain Descriptors / Indicators Aching;Discomfort    Pain Type Chronic pain    Pain Onset More than a month ago    Pain Frequency Constant    Aggravating Factors  weather    Pain Relieving Factors tylenol    Effect of Pain on  Daily Activities Limits time on her feet              [facility] PT Assessment - 04/09/20 0001      Observation/Other Assessments   Focus on Therapeutic Outcomes (FOTO)  48%      Strength   Right Hip Flexion 4/5    Right Hip ABduction 4/5    Left Hip Flexion 4/5    Left Hip ABduction 4/5      Standardized Balance Assessment   Five times sit to stand comments  31      Berg Balance Test   Sit to Stand Able to stand  independently using hands    Standing Unsupported Able to stand safely 2 minutes    Sitting with Back Unsupported but Feet Supported on Floor or Stool Able to sit safely and securely 2 minutes    Stand to Sit Controls descent by using hands    Transfers Able to transfer with verbal cueing and /or supervision    Standing Unsupported with Eyes Closed Able to stand 10 seconds safely    Standing Unsupported with Feet Together Able to place feet together independently and stand 1 minute safely    From Standing, Reach Forward with Outstretched Arm Can reach confidently >25 cm (10")    From Standing Position, Pick up Object from Floor Unable to pick up shoe, but reaches 2-5 cm (1-2") from shoe and balances independently    From Standing Position, Turn to Look Behind Over each Shoulder Looks behind one side only/other side shows less weight shift    Turn 360 Degrees Able to turn 360 degrees safely but slowly    Standing Unsupported, Alternately Place Feet on Step/Stool Able to complete 4 steps without aid or supervision    Standing Unsupported, One Foot in Front Able to take small step independently and hold 30 seconds    Standing on One Leg Tries to lift leg/unable to hold 3 seconds but remains standing independently    Total Score 40      Timed Up and Go Test   Normal TUG (seconds) 18                         [facility] Adult PT Treatment/Exercise - 04/09/20 0001      Knee/Hip Exercises: Standing   Heel Raises 15 reps    Heel Raises Limitations and toe raises     Hip Flexion Limitations alternating march x 20 1 UE light touch   light touch   Hip Abduction 2 sets;10 reps  Hip Extension Both;2 sets;10 reps      Knee/Hip Exercises: Seated   Long Arc Quad Right;Left;15 reps    Sit to [employer] 2 sets;5 reps;with UE support   airex pad on mat table for elevation              Balance Exercises - 04/09/20 0001      Balance Exercises: Standing   Standing Eyes Opened Foam/compliant surface;Narrow base of support (BOS);3 reps;10 secs;Head turns    Standing Eyes Closed Narrow base of support (BOS);Foam/compliant surface;3 reps;10 secs    Tandem Stance Eyes open;Upper extremity support 1               PT Short Term Goals - 03/21/20 1352      PT SHORT TERM GOAL #1   Title Pt will be IND with HEP to demonstration functional capacity for ADLs    Baseline reports compliance    Time 3    Period Weeks    Status Achieved      PT SHORT TERM GOAL #2   Title Pt will increase standing time from 15 minutes to 30 minutes with </=2/10 pain report to increase independence with ADLs    Baseline improving    Time 3    Period Weeks    Status On-going      PT SHORT TERM GOAL #3   Title Pt will be tested on 5xSTS and BERG and educated on results    Baseline Both tested    Time 3    Period Weeks    Status Achieved             PT Long Term Goals - 04/09/20 6761      PT LONG TERM GOAL #1   Title Pt will be fully IND with all provided HEP to demonstrate ability to maintain functional independence. 04/08/20: On-going    Status On-going    Target Date 05/20/20      PT LONG TERM GOAL #2   Title Pt will improve FOTO ability to 51% to demonstrate increased agency and quality of life. 04/08/20: On-going-48%    Baseline 42%    Status On-going    Target Date 05/20/20      PT LONG TERM GOAL #3   Title Pt will demonstrate increased gross hip and LE strength to >/=4+/5 to improve gait and reduce risk of falls. 1//7/22: On-going 4/5    Baseline 4-/5     Status On-going    Target Date 05/20/20      PT LONG TERM GOAL #4   Title Pt will reduce manual TUG score by >/=8s to reduce fall risk and improve independence with ADLs. Met- 18s    Baseline 29s    Status Achieved    Target Date 04/08/20      PT LONG TERM GOAL #5   Title Pt will improve score on balance assessments including 5xSTS and BERG to demonstrate reduction in fall risk. Met- Berg 40/56; 5xSTS 31s    Baseline 34/56 and 55.5 sec    Status Achieved    Target Date 04/08/20                 Plan - 04/09/20 1502    Clinical Impression Statement Re-assessment today found the pt has made good progress in strength and functional abilities. Her Berg, TUG, and 5xSTS have all improved and have reached the set goals. Pt's hip strength and FOTO score have improved, but have not reach their set goals.  Additionallly, pt states she would like for her balance and confidence with mobility to improve more. Pt will benefit from additional PT 1w6 to address the pt's strength and balance to optimize her functional mobility and safety.    Personal Factors and Comorbidities Comorbidity 2;Comorbidity 3+    Comorbidities Hx of HTN, OA especially right knee, herniated lumbar disc    Examination-Activity Limitations Stand;Stairs;Squat    Examination-Participation Restrictions Cleaning    Stability/Clinical Decision Making Evolving/Moderate complexity    Clinical Decision Making Moderate    Rehab Potential Good    PT Frequency 2x / week    PT Duration 6 weeks    PT Treatment/Interventions ADLs/Self Care Home Management;DME Instruction;Moist Heat;Iontophoresis 59m/ml Dexamethasone;Stair training;Gait training;Functional mobility training;Therapeutic activities;Therapeutic exercise;Balance training;Neuromuscular re-education;Manual techniques;Patient/family education;Dry needling;Energy conservation;Joint Manipulations;Spinal Manipulations;Taping    PT Next Visit Plan Progress LE strengthening and  balance exs as indicated.    PT Home Exercise Plan [id number]- seated hamstring stretch, STS with arms, heel and toe raises (seated), standing hip extension, hip add sets seated, hip abd c red Tband seated    Consulted and Agree with Plan of Care Patient           Patient will benefit from skilled therapeutic intervention in order to improve the following deficits and impairments:  Abnormal gait,Decreased activity tolerance,Decreased balance,Decreased mobility,Decreased endurance,Decreased strength,Difficulty walking,Postural dysfunction,Pain  Visit Diagnosis: Other abnormalities of gait and mobility  Muscle weakness (generalized)  Unsteadiness on feet  Difficulty in walking, not elsewhere classified     Problem List Patient Active Problem List   Diagnosis Date Noted  . Ganglion cyst of dorsum of right wrist 12/15/2018  . Lichen sclerosus of female genitalia 09/25/2018    Class: Chronic  . Hyperlipidemia 03/17/2018  . Lichen planus 174/25/9563 . Numbness and tingling of right leg 10/19/2016  . Shoulder pain 03/01/2016  . Headache 03/01/2016  . Asymmetric SNHL (sensorineural hearing loss) 01/10/2016  . Tinnitus of both ears 01/10/2016  . Hearing loss 10/07/2015  . Thyroid nodule 05/24/2015  . Globus sensation 05/05/2015  . Neck pain 01/07/2015  . Allergic rhinitis 07/19/2014  . GERD (gastroesophageal reflux disease) 07/19/2014  . Pitting edema 07/19/2014  . Swallowing difficulty 07/06/2013  . Osteoarthritis of right knee 12/26/2012  . GLAUCOMA 11/04/2009  . DDD (degenerative disc disease), lumbar 05/10/2009  . URGE INCONTINENCE 06/01/2008  . Overweight (BMI 25.0-29.9) 05/30/2006  . HYPERTENSION, BENIGN SYSTEMIC 05/30/2006  . Osteoarthritis 05/30/2006    AGar PontoMS, PT 04/09/20 3:19 PM  [facility] [address]Phone: [phone]  Fax:  [phone] Name: Laura FRANSONMRN: [id number] of Birth: 2Feb 24, 1947

## 2020-04-14 ENCOUNTER — Ambulatory Visit: Payer: Medicare Other | Admitting: Physical Therapy

## 2020-04-14 ENCOUNTER — Encounter: Payer: Self-pay | Admitting: Physical Therapy

## 2020-04-14 ENCOUNTER — Other Ambulatory Visit: Payer: Self-pay

## 2020-04-14 DIAGNOSIS — R2681 Unsteadiness on feet: Secondary | ICD-10-CM | POA: Diagnosis not present

## 2020-04-14 DIAGNOSIS — M6281 Muscle weakness (generalized): Secondary | ICD-10-CM

## 2020-04-14 DIAGNOSIS — R262 Difficulty in walking, not elsewhere classified: Secondary | ICD-10-CM

## 2020-04-14 DIAGNOSIS — R2689 Other abnormalities of gait and mobility: Secondary | ICD-10-CM

## 2020-04-14 NOTE — Telephone Encounter (Signed)
Spoke with patient.  Notified of PA approval.  Patient has picked up medication.  Patient thankful for call.  Encounter closed.

## 2020-04-14 NOTE — Therapy (Signed)
[place], [name], [address] Phone: [phone]   Fax:  [phone]  Physical Therapy Treatment  Patient Details  Name: Laura Griffin MRN: [id number] Date of Birth: Sep 30, 1945 Referring Provider (PT): Leeanne Rio, MD    Encounter Date: 04/14/2020   PT End of Session - 04/14/20 0940    Visit Number 11    Number of Visits 16    Date for PT Re-Evaluation 05/20/20    Authorization Type MCR/[employer] - FOTO at 6th and 10th visit. KX mod at 15th visit.    Progress Note Due on Visit 10    PT Start Time 0932    PT Stop Time 1020    PT Time Calculation (min) 48 min           Past Medical History:  Diagnosis Date  . Allergic rhinitis 07/19/2014  . Arthritis of knee, right 03/11/2006  . Bursitis of shoulder, right 09/19/1999  . Cervical radiculopathy at C6 05/24/2011   Beginning Nov 2012. Multi-level foraminal narrowing in C-spine plain films   . Diverticulosis   . DIVERTICULOSIS OF COLON 05/30/2006   Colonoscopy 06/2014 - multiple diverticula, no other abnormalities, no further colonoscopies recommended given patient's age of 21    . GERD (gastroesophageal reflux disease) 07/19/2014   Diagnosed clinically by [facility] in January of 2016.    . Glaucoma   . Hearing decreased 08/07/2011   With whooshing tinnitus Mild bilateral loss on 08/2012 audiology who will recheck her hearing in one year    . History of herniated intervertebral disc   . Hypertension   . HYPERTENSION, BENIGN SYSTEMIC 05/30/2006       . Left sided sciatica 8/95,11/00   CT confirmed   . LOW BACK PAIN SYNDROME 05/10/2009   Qualifier: Diagnosis of  By: Javier Glazier CMA,, Thekla  Mild C 3-4, 4-5 lumbar spinal stenosis MRI 08/2011, Diffuse lower thoracic and lumbar DDD   . Mitral valve prolapse    per patient heart murmur  . Orthostatic hypotension 06/12/2013  . Osteoarthritis   . Osteoarthritis of right knee 12/26/2012   Confirmed on  xray 07/2014   . OSTEOARTHRITIS, MULTI SITES 05/30/2006   She is stoic about pain and wants to avoid surgeries Past xrays have documented DJD in right knee and lumbar spine    . Overweight (BMI 25.0-29.9) 05/30/2006  . Pitting edema 07/19/2014   Bilateral 1+ pitting edema of calves first noted on 07/19/2014.    . Sciatica 05/30/2006   Qualifier: Diagnosis of  By: Walker Kehr MD, Wayne  Involved left leg in 02/1999   . Swallowing difficulty 07/06/2013  . Urge incontinence 06/01/2008   Qualifier: Diagnosis of  By: Walker Kehr MD, Patrick Jupiter      Past Surgical History:  Procedure Laterality Date  . CATARACT EXTRACTION  02/2011   left eye  . TUBAL LIGATION  1973  . VULVA /PERINEUM BIOPSY  2010   for hypopigmentation, non-specific result    There were no vitals filed for this visit.   Subjective Assessment - 04/14/20 0938    Subjective Pt reports sharp pain yesterday when she got out of bed and is having difficulty weight bearing on right knee due to pain.    Currently in Pain? Yes    Pain Score 9     Pain Location Knee    Pain Orientation Right    Pain Descriptors / Indicators Aching;Dull    Pain Type Chronic pain    Aggravating Factors  weight bearing    Pain Relieving Factors bio freeze                             [facility] Adult PT Treatment/Exercise - 04/14/20 0001      Lumbar Exercises: Seated   Long Arc Quad on Chair 20 reps    Other Seated Lumbar Exercises marching x20    Other Seated Lumbar Exercises seated heel and toe raises      Modalities   Modalities Cryotherapy      Cryotherapy   Number Minutes Cryotherapy 15 Minutes    Cryotherapy Location Knee    Type of Cryotherapy Ice pack      Manual Therapy   Manual therapy comments STW to right knee - quad and hamstring, calf, gentle stretching passive knee to chest, hamstring and gastroc- less painful after STW                    PT Short Term Goals - 03/21/20 1352      PT SHORT TERM GOAL #1   Title Pt will be  IND with HEP to demonstration functional capacity for ADLs    Baseline reports compliance    Time 3    Period Weeks    Status Achieved      PT SHORT TERM GOAL #2   Title Pt will increase standing time from 15 minutes to 30 minutes with </=2/10 pain report to increase independence with ADLs    Baseline improving    Time 3    Period Weeks    Status On-going      PT SHORT TERM GOAL #3   Title Pt will be tested on 5xSTS and BERG and educated on results    Baseline Both tested    Time 3    Period Weeks    Status Achieved             PT Long Term Goals - 04/09/20 0936      PT LONG TERM GOAL #1   Title Pt will be fully IND with all provided HEP to demonstrate ability to maintain functional independence. 04/08/20: On-going    Status On-going    Target Date 05/20/20      PT LONG TERM GOAL #2   Title Pt will improve FOTO ability to 51% to demonstrate increased agency and quality of life. 04/08/20: On-going-48%    Baseline 42%    Status On-going    Target Date 05/20/20      PT LONG TERM GOAL #3   Title Pt will demonstrate increased gross hip and LE strength to >/=4+/5 to improve gait and reduce risk of falls. 1//7/22: On-going 4/5    Baseline 4-/5    Status On-going    Target Date 05/20/20      PT LONG TERM GOAL #4   Title Pt will reduce manual TUG score by >/=8s to reduce fall risk and improve independence with ADLs. Met- 18s    Baseline 29s    Status Achieved    Target Date 04/08/20      PT LONG TERM GOAL #5   Title Pt will improve score on balance assessments including 5xSTS and BERG to demonstrate reduction in fall risk. Met- Berg 40/56; 5xSTS 31s    Baseline 34/56 and 55.5 sec    Status Achieved    Target Date 04/08/20  Plan - 04/14/20 0941    Clinical Impression Statement Pt arrives with [facility] and significant antalgic pattern, decreased right knee flexion and decreased DF with gait. One episode of stumbing with independent recovery. Began seated  LE AROM which caused increased right knee pain. Treatment focused on manual STW to decrease spasm in right knee musculature. Able to soften muscles and decrease pain with AROM. Cryotherapy used to decrease pain and inflammtion at end of session. After session, less pain with weight bearing but still very hesitant.    PT Next Visit Plan Progress LE strengthening and balance exs as indicated.    PT Home Exercise Plan [id number] - seated hamstring stretch, STS with arms, heel and toe raises (seated), standing hip extension, hip add sets seated, hip abd c red Tband seated           Patient will benefit from skilled therapeutic intervention in order to improve the following deficits and impairments:  Abnormal gait,Decreased activity tolerance,Decreased balance,Decreased mobility,Decreased endurance,Decreased strength,Difficulty walking,Postural dysfunction,Pain  Visit Diagnosis: Other abnormalities of gait and mobility  Muscle weakness (generalized)  Unsteadiness on feet  Difficulty in walking, not elsewhere classified     Problem List Patient Active Problem List   Diagnosis Date Noted  . Ganglion cyst of dorsum of right wrist 12/15/2018  . Lichen sclerosus of female genitalia 09/25/2018    Class: Chronic  . Hyperlipidemia 03/17/2018  . Lichen planus 09/64/3838  . Numbness and tingling of right leg 10/19/2016  . Shoulder pain 03/01/2016  . Headache 03/01/2016  . Asymmetric SNHL (sensorineural hearing loss) 01/10/2016  . Tinnitus of both ears 01/10/2016  . Hearing loss 10/07/2015  . Thyroid nodule 05/24/2015  . Globus sensation 05/05/2015  . Neck pain 01/07/2015  . Allergic rhinitis 07/19/2014  . GERD (gastroesophageal reflux disease) 07/19/2014  . Pitting edema 07/19/2014  . Swallowing difficulty 07/06/2013  . Osteoarthritis of right knee 12/26/2012  . GLAUCOMA 11/04/2009  . DDD (degenerative disc disease), lumbar 05/10/2009  . URGE INCONTINENCE 06/01/2008  . Overweight (BMI  25.0-29.9) 05/30/2006  . HYPERTENSION, BENIGN SYSTEMIC 05/30/2006  . Osteoarthritis 05/30/2006    Dorene Ar , PTA 04/14/2020, 10:49 AM  [facility] [address] [place], [name], [address] Phone: [phone]   Fax:  [phone]  Name: Laura Griffin MRN: [id number] Date of Birth: 26-Oct-1945

## 2020-04-21 ENCOUNTER — Encounter: Payer: Self-pay | Admitting: Physical Therapy

## 2020-04-21 ENCOUNTER — Other Ambulatory Visit: Payer: Self-pay

## 2020-04-21 ENCOUNTER — Ambulatory Visit: Payer: Medicare Other | Admitting: Physical Therapy

## 2020-04-21 DIAGNOSIS — R2689 Other abnormalities of gait and mobility: Secondary | ICD-10-CM | POA: Diagnosis not present

## 2020-04-21 DIAGNOSIS — R262 Difficulty in walking, not elsewhere classified: Secondary | ICD-10-CM

## 2020-04-21 DIAGNOSIS — R2681 Unsteadiness on feet: Secondary | ICD-10-CM

## 2020-04-21 DIAGNOSIS — M6281 Muscle weakness (generalized): Secondary | ICD-10-CM

## 2020-04-21 NOTE — Therapy (Addendum)
[place], [name], [address] Phone: [phone]   Fax:  [phone]  Physical Therapy Treatment / Discharge  Patient Details  Name: Laura Griffin MRN: [id number] Date of Birth: September 10, 1945 Referring Provider (PT): Leeanne Rio, MD    Encounter Date: 04/21/2020   PT End of Session - 04/21/20 1238    Visit Number 12    Number of Visits 16    Date for PT Re-Evaluation 05/20/20    Authorization Type MCR/[employer]ign[employer] - FOTO at 6th and 10th visit. KX mod at 15th visit.    PT Start Time 1147    PT Stop Time 1230    PT Time Calculation (min) 43 min           Past Medical History:  Diagnosis Date  . Allergic rhinitis 07/19/2014  . Arthritis of knee, right 03/11/2006  . Bursitis of shoulder, right 09/19/1999  . Cervical radiculopathy at C6 05/24/2011   Beginning Nov 2012. Multi-level foraminal narrowing in C-spine plain films   . Diverticulosis   . DIVERTICULOSIS OF COLON 05/30/2006   Colonoscopy 06/2014 - multiple diverticula, no other abnormalities, no further colonoscopies recommended given patient's age of 79    . GERD (gastroesophageal reflux disease) 07/19/2014   Diagnosed clinically by [facility] in January of 2016.    . Glaucoma   . Hearing decreased 08/07/2011   With whooshing tinnitus Mild bilateral loss on 08/2012 audiology who will recheck her hearing in one year    . History of herniated intervertebral disc   . Hypertension   . HYPERTENSION, BENIGN SYSTEMIC 05/30/2006       . Left sided sciatica 8/95,11/00   CT confirmed   . LOW BACK PAIN SYNDROME 05/10/2009   Qualifier: Diagnosis of  By: Javier Glazier CMA,, Thekla  Mild C 3-4, 4-5 lumbar spinal stenosis MRI 08/2011, Diffuse lower thoracic and lumbar DDD   . Mitral valve prolapse    per patient heart murmur  . Orthostatic hypotension 06/12/2013  . Osteoarthritis   . Osteoarthritis of right knee 12/26/2012   Confirmed on xray 07/2014   .  OSTEOARTHRITIS, MULTI SITES 05/30/2006   She is stoic about pain and wants to avoid surgeries Past xrays have documented DJD in right knee and lumbar spine    . Overweight (BMI 25.0-29.9) 05/30/2006  . Pitting edema 07/19/2014   Bilateral 1+ pitting edema of calves first noted on 07/19/2014.    . Sciatica 05/30/2006   Qualifier: Diagnosis of  By: Walker Kehr MD, Wayne  Involved left leg in 02/1999   . Swallowing difficulty 07/06/2013  . Urge incontinence 06/01/2008   Qualifier: Diagnosis of  By: Walker Kehr MD, Patrick Jupiter      Past Surgical History:  Procedure Laterality Date  . CATARACT EXTRACTION  02/2011   left eye  . TUBAL LIGATION  1973  . VULVA /PERINEUM BIOPSY  2010   for hypopigmentation, non-specific result    There were no vitals filed for this visit.   Subjective Assessment - 04/21/20 1150    Subjective Pt reports continued sharp right knee pain with ambulation and transfers and wrong movements.    Currently in Pain? Yes    Pain Score 10-Worst pain ever    Pain Location Knee    Pain Orientation Right;Lateral;Medial    Pain Descriptors / Indicators Aching;Sharp    Pain Type Acute pain    Pain Onset In the past 7 days    Pain Frequency Constant    Aggravating  Factors  weight bearing, transfers    Pain Relieving Factors ice helped some                             [facility] Adult PT Treatment/Exercise - 04/21/20 0001      Self-Care   Self-Care Heat/Ice Application    Heat/Ice Application continue to ice 3-5 times per day until seen by MD      Lumbar Exercises: Seated   Long Arc Quad on Chair 15 reps    Other Seated Lumbar Exercises seated heel and toe raises      Knee/Hip Exercises: Stretches   Active Hamstring Stretch 1 rep   pt reports knee began throbbing   Active Hamstring Stretch Limitations seated EOM      Knee/Hip Exercises: Seated   Long Arc Quad 15 reps    Long Arc Quad Limitations increased pain    Heel Slides 15 reps    Heel Slides Limitations sliding towel  on floor      Cryotherapy   Number Minutes Cryotherapy 15 Minutes    Cryotherapy Location Knee    Type of Cryotherapy Ice pack      Manual Therapy   Manual therapy comments STW to right quad                    PT Short Term Goals - 03/21/20 1352      PT SHORT TERM GOAL #1   Title Pt will be IND with HEP to demonstration functional capacity for ADLs    Baseline reports compliance    Time 3    Period Weeks    Status Achieved      PT SHORT TERM GOAL #2   Title Pt will increase standing time from 15 minutes to 30 minutes with </=2/10 pain report to increase independence with ADLs    Baseline improving    Time 3    Period Weeks    Status On-going      PT SHORT TERM GOAL #3   Title Pt will be tested on 5xSTS and BERG and educated on results    Baseline Both tested    Time 3    Period Weeks    Status Achieved             PT Long Term Goals - 04/09/20 0936      PT LONG TERM GOAL #1   Title Pt will be fully IND with all provided HEP to demonstrate ability to maintain functional independence. 04/08/20: On-going    Status On-going    Target Date 05/20/20      PT LONG TERM GOAL #2   Title Pt will improve FOTO ability to 51% to demonstrate increased agency and quality of life. 04/08/20: On-going-48%    Baseline 42%    Status On-going    Target Date 05/20/20      PT LONG TERM GOAL #3   Title Pt will demonstrate increased gross hip and LE strength to >/=4+/5 to improve gait and reduce risk of falls. 1//7/22: On-going 4/5    Baseline 4-/5    Status On-going    Target Date 05/20/20      PT LONG TERM GOAL #4   Title Pt will reduce manual TUG score by >/=8s to reduce fall risk and improve independence with ADLs. Met- 18s    Baseline 29s    Status Achieved    Target Date 04/08/20  PT LONG TERM GOAL #5   Title Pt will improve score on balance assessments including 5xSTS and BERG to demonstrate reduction in fall risk. Met- Berg 40/56; 5xSTS 31s    Baseline 34/56  and 55.5 sec    Status Achieved    Target Date 04/08/20                 Plan - 04/21/20 1240    Clinical Impression Statement Pt arrives with [facility] and slow antalgic gait pattern. She reports improvement in her acute knee pain after last manual treatment and using ice at home. She reports the day after last treatment the pain came back and has worsened even more, she notes ice treatments have stopped helping. Her pain is located at medial and lateral right knee joint line and is referring up into thigh and into shin. Soft tissue performed to right quad followed by cryotherapy. She was unable to tolerate passive stretching and movement of RLE. Consulted with primary PT Gar Ponto, who recommended to HOLD PT for now until pt can be seen by orthopedic MD.    PT Next Visit Plan HOLD PT due to acute on chronic right knee pain. pt to see MD    PT Home Exercise Plan [id number] - seated hamstring stretch, STS with arms, heel and toe raises (seated), standing hip extension, hip add sets seated, hip abd c red Tband seated    Consulted and Agree with Plan of Care Patient           Patient will benefit from skilled therapeutic intervention in order to improve the following deficits and impairments:  Abnormal gait,Decreased activity tolerance,Decreased balance,Decreased mobility,Decreased endurance,Decreased strength,Difficulty walking,Postural dysfunction,Pain  Visit Diagnosis: Other abnormalities of gait and mobility  Muscle weakness (generalized)  Unsteadiness on feet  Difficulty in walking, not elsewhere classified     Problem List Patient Active Problem List   Diagnosis Date Noted  . Ganglion cyst of dorsum of right wrist 12/15/2018  . Lichen sclerosus of female genitalia 09/25/2018    Class: Chronic  . Hyperlipidemia 03/17/2018  . Lichen planus 62/94/7654  . Numbness and tingling of right leg 10/19/2016  . Shoulder pain 03/01/2016  . Headache 03/01/2016  . Asymmetric SNHL  (sensorineural hearing loss) 01/10/2016  . Tinnitus of both ears 01/10/2016  . Hearing loss 10/07/2015  . Thyroid nodule 05/24/2015  . Globus sensation 05/05/2015  . Neck pain 01/07/2015  . Allergic rhinitis 07/19/2014  . GERD (gastroesophageal reflux disease) 07/19/2014  . Pitting edema 07/19/2014  . Swallowing difficulty 07/06/2013  . Osteoarthritis of right knee 12/26/2012  . GLAUCOMA 11/04/2009  . DDD (degenerative disc disease), lumbar 05/10/2009  . URGE INCONTINENCE 06/01/2008  . Overweight (BMI 25.0-29.9) 05/30/2006  . HYPERTENSION, BENIGN SYSTEMIC 05/30/2006  . Osteoarthritis 05/30/2006    Dorene Ar, PTA 04/21/2020, 12:49 PM  [facility] [address] [place], [name], [address] Phone: [phone]   Fax:  [phone]  Name: Laura Griffin MRN: [id number] Date of Birth: 19-Feb-1946      PHYSICAL THERAPY DISCHARGE SUMMARY  Visits from Start of Care: 12  Current functional level related to goals / functional outcomes: See goals   Remaining deficits: Current status unknown   Education / Equipment: HEP  Plan: Patient agrees to discharge.  Patient goals were not met. Patient is being discharged due to not returning since the last visit.  ?????        Kristoffer Leamon PT, DPT, LAT, ATC  06/07/20  1:20 PM

## 2020-04-22 ENCOUNTER — Telehealth: Payer: Self-pay

## 2020-04-22 DIAGNOSIS — M25569 Pain in unspecified knee: Secondary | ICD-10-CM

## 2020-04-22 NOTE — Telephone Encounter (Signed)
It is fine to refer her to physical therapy. I've placed the referral. Please let patient know.  Latrelle Dodrill, MD

## 2020-04-22 NOTE — Telephone Encounter (Signed)
Patient calls nurse line regarding continued pain in right knee. Patient reports that she has difficulty putting pressure on right leg. Patient has been seeing PT for this issue, however, they are recommending she be referred to Orthopedic specialist. Advised patient to schedule appointment to discuss referral and HTN follow up. Scheduled patient follow up appointment on 2/8.Please advise if referral can be placed prior to appointment.   To PCP  Veronda Prude, RN

## 2020-04-25 NOTE — Telephone Encounter (Signed)
Pt informed. Jakhi Dishman, CMA  

## 2020-04-27 ENCOUNTER — Ambulatory Visit (INDEPENDENT_AMBULATORY_CARE_PROVIDER_SITE_OTHER): Payer: Medicare Other

## 2020-04-27 ENCOUNTER — Ambulatory Visit (INDEPENDENT_AMBULATORY_CARE_PROVIDER_SITE_OTHER): Payer: Medicare Other | Admitting: Orthopedic Surgery

## 2020-04-27 DIAGNOSIS — M25561 Pain in right knee: Secondary | ICD-10-CM

## 2020-04-27 DIAGNOSIS — M1711 Unilateral primary osteoarthritis, right knee: Secondary | ICD-10-CM

## 2020-04-28 ENCOUNTER — Encounter: Payer: Self-pay | Admitting: Orthopedic Surgery

## 2020-04-28 ENCOUNTER — Ambulatory Visit: Payer: Medicare Other | Admitting: Physical Therapy

## 2020-04-28 DIAGNOSIS — M1711 Unilateral primary osteoarthritis, right knee: Secondary | ICD-10-CM | POA: Diagnosis not present

## 2020-04-28 MED ORDER — METHYLPREDNISOLONE ACETATE 40 MG/ML IJ SUSP
40.0000 mg | INTRAMUSCULAR | Status: AC | PRN
  Administered 2020-04-28: 40 mg via INTRA_ARTICULAR

## 2020-04-28 MED ORDER — BUPIVACAINE HCL 0.25 % IJ SOLN
4.0000 mL | INTRAMUSCULAR | Status: AC | PRN
  Administered 2020-04-28: 4 mL via INTRA_ARTICULAR

## 2020-04-28 MED ORDER — LIDOCAINE HCL 1 % IJ SOLN
5.0000 mL | INTRAMUSCULAR | Status: AC | PRN
  Administered 2020-04-28: 5 mL

## 2020-04-28 NOTE — Progress Notes (Signed)
Office Visit Note   Patient: Laura Griffin           Date of Birth: 07/26/1945           MRN: [id number] Visit Date: 04/27/2020 Requested by: Laura Dodrill, MD [address] [place],  [place] [address] PCP: Laura Dodrill, MD  Subjective: Chief Complaint  Patient presents with  . Right Knee - Pain    HPI: Laura Griffin is a 75 year old patient with right knee pain for years.  Has had acute worsening of her pain over the last week.  She did feel a pop in the knee.  Ambulates with a cane.  Takes Tylenol for her symptoms.              ROS: All systems reviewed are negative as they relate to the chief complaint within the history of present illness.  Patient denies  fevers or chills.   Assessment & Plan: Visit Diagnoses:  1. Right knee pain, unspecified chronicity     Plan: Impression is exacerbation of relatively severe lateral compartment arthritis in the right knee.  Plan is aspiration and injection today.  I think that would give her some relief.  Could consider gel injection in the future if her knee pain recurs.  She wants to hold off on any type of operative intervention.  We will see her back in 4 to 6 weeks once this cortisone shot wears off.  Is possible she may get back to her baseline as well. This patient is diagnosed with osteoarthritis of the knee(s).    Radiographs show evidence of joint space narrowing, osteophytes, subchondral sclerosis and/or subchondral cysts.  This patient has knee pain which interferes with functional and activities of daily living.    This patient has experienced inadequate response, adverse effects and/or intolerance with conservative treatments such as acetaminophen, NSAIDS, topical creams, physical therapy or regular exercise, knee bracing and/or weight loss.   This patient has experienced inadequate response or has a contraindication to intra articular steroid injections for at least 3 months.   This patient is not scheduled  to have a total knee replacement within 6 months of starting treatment with viscosupplementation.   Follow-Up Instructions: Return if symptoms worsen or fail to improve.   Orders:  Orders Placed This Encounter  Procedures  . XR Knee 1-2 Views Right   No orders of the defined types were placed in this encounter.     Procedures: Large Joint Inj: R knee on 04/28/2020 12:02 PM Indications: diagnostic evaluation, joint swelling and pain Details: 18 G 1.5 in needle, superolateral approach  Arthrogram: No  Medications: 5 mL lidocaine 1 %; 40 mg methylPREDNISolone acetate 40 MG/ML; 4 mL bupivacaine 0.25 % Outcome: tolerated well, no immediate complications Procedure, treatment alternatives, risks and benefits explained, specific risks discussed. Consent was given by the patient. Immediately prior to procedure a time out was called to verify the correct patient, procedure, equipment, support staff and site/side marked as required. Patient was prepped and draped in the usual sterile fashion.       Clinical Data: No additional findings.  Objective: Vital Signs: LMP 04/02/1996   Physical Exam:   Constitutional: Patient appears well-developed HEENT:  Head: Normocephalic Eyes:EOM are normal Neck: Normal range of motion Cardiovascular: Normal rate Pulmonary/chest: Effort normal Neurologic: Patient is alert Skin: Skin is warm Psychiatric: Patient has normal mood and affect    Ortho Exam: Ortho exam demonstrates mild effusion right knee.  Mild valgus alignment.  Some edema present bilateral lower extremities.  Both feet are perfused and sensate with intact ankle dorsiflexion.  Collateral crucial ligaments are stable.  No groin pain with internal ex rotation of the leg.  No other masses lymphadenopathy or skin changes noted in that right knee region.  Specialty Comments:  No specialty comments available.  Imaging: No results found.   PMFS History: Patient Active Problem List    Diagnosis Date Noted  . Ganglion cyst of dorsum of right wrist 12/15/2018  . Lichen sclerosus of female genitalia 09/25/2018    Class: Chronic  . Hyperlipidemia 03/17/2018  . Lichen planus 03/17/2018  . Numbness and tingling of right leg 10/19/2016  . Shoulder pain 03/01/2016  . Headache 03/01/2016  . Asymmetric SNHL (sensorineural hearing loss) 01/10/2016  . Tinnitus of both ears 01/10/2016  . Hearing loss 10/07/2015  . Thyroid nodule 05/24/2015  . Globus sensation 05/05/2015  . Neck pain 01/07/2015  . Allergic rhinitis 07/19/2014  . GERD (gastroesophageal reflux disease) 07/19/2014  . Pitting edema 07/19/2014  . Swallowing difficulty 07/06/2013  . Osteoarthritis of right knee 12/26/2012  . GLAUCOMA 11/04/2009  . DDD (degenerative disc disease), lumbar 05/10/2009  . URGE INCONTINENCE 06/01/2008  . Overweight (BMI 25.0-29.9) 05/30/2006  . HYPERTENSION, BENIGN SYSTEMIC 05/30/2006  . Osteoarthritis 05/30/2006   Past Medical History:  Diagnosis Date  . Allergic rhinitis 07/19/2014  . Arthritis of knee, right 03/11/2006  . Bursitis of shoulder, right 09/19/1999  . Cervical radiculopathy at C6 05/24/2011   Beginning Nov 2012. Multi-level foraminal narrowing in C-spine plain films   . Diverticulosis   . DIVERTICULOSIS OF COLON 05/30/2006   Colonoscopy 06/2014 - multiple diverticula, no other abnormalities, no further colonoscopies recommended given patient's age of 33    . GERD (gastroesophageal reflux disease) 07/19/2014   Diagnosed clinically by [facility] in January of 2016.    . Glaucoma   . Hearing decreased 08/07/2011   With whooshing tinnitus Mild bilateral loss on 08/2012 audiology who will recheck her hearing in one year    . History of herniated intervertebral disc   . Hypertension   . HYPERTENSION, BENIGN SYSTEMIC 05/30/2006       . Left sided sciatica 8/95,11/00   CT confirmed   . LOW BACK PAIN SYNDROME 05/10/2009   Qualifier: Diagnosis of  By: Lorenda Hatchet CMA,,  Thekla  Mild C 3-4, 4-5 lumbar spinal stenosis MRI 08/2011, Diffuse lower thoracic and lumbar DDD   . Mitral valve prolapse    per patient heart murmur  . Orthostatic hypotension 06/12/2013  . Osteoarthritis   . Osteoarthritis of right knee 12/26/2012   Confirmed on xray 07/2014   . OSTEOARTHRITIS, MULTI SITES 05/30/2006   She is stoic about pain and wants to avoid surgeries Past xrays have documented DJD in right knee and lumbar spine    . Overweight (BMI 25.0-29.9) 05/30/2006  . Pitting edema 07/19/2014   Bilateral 1+ pitting edema of calves first noted on 07/19/2014.    . Sciatica 05/30/2006   Qualifier: Diagnosis of  By: Sheffield Slider MD, Wayne  Involved left leg in 02/1999   . Swallowing difficulty 07/06/2013  . Urge incontinence 06/01/2008   Qualifier: Diagnosis of  By: Sheffield Slider MD, Deniece Portela      Family History  Problem Relation Age of Onset  . Breast cancer Sister        61  . Breast cancer Sister 33  . Hypertension Father   . Heart disease Father   . Stroke Mother 38  cerebral hemorrhage  . Hypertension Mother   . Breast cancer Maternal Aunt     Past Surgical History:  Procedure Laterality Date  . CATARACT EXTRACTION  02/2011   left eye  . TUBAL LIGATION  1973  . VULVA /PERINEUM BIOPSY  2010   for hypopigmentation, non-specific result   Social History   Occupational History  . 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 Smoking status: Former Smoker    Packs/day: 0.50    Years: 15.00    Pack years: 7.50    Types: Cigarettes    Quit date: 04/02/1994    Years since quitting: 26.0  . Smokeless tobacco: Never Used  Vaping Use  . Vaping Use: Never used  Substance and Sexual Activity  . Alcohol use: No  . Drug use: No  . Sexual activity: Not Currently    Partners: Male    Birth control/protection: Surgical    Comment: BTSP

## 2020-05-03 DIAGNOSIS — H524 Presbyopia: Secondary | ICD-10-CM | POA: Diagnosis not present

## 2020-05-03 DIAGNOSIS — H52223 Regular astigmatism, bilateral: Secondary | ICD-10-CM | POA: Diagnosis not present

## 2020-05-03 DIAGNOSIS — Z961 Presence of intraocular lens: Secondary | ICD-10-CM | POA: Diagnosis not present

## 2020-05-03 DIAGNOSIS — H5203 Hypermetropia, bilateral: Secondary | ICD-10-CM | POA: Diagnosis not present

## 2020-05-03 DIAGNOSIS — H401133 Primary open-angle glaucoma, bilateral, severe stage: Secondary | ICD-10-CM | POA: Diagnosis not present

## 2020-05-10 ENCOUNTER — Other Ambulatory Visit: Payer: Self-pay

## 2020-05-10 ENCOUNTER — Ambulatory Visit (INDEPENDENT_AMBULATORY_CARE_PROVIDER_SITE_OTHER): Payer: Medicare Other | Admitting: Family Medicine

## 2020-05-10 DIAGNOSIS — I10 Essential (primary) hypertension: Secondary | ICD-10-CM | POA: Diagnosis not present

## 2020-05-10 DIAGNOSIS — K219 Gastro-esophageal reflux disease without esophagitis: Secondary | ICD-10-CM | POA: Diagnosis not present

## 2020-05-10 DIAGNOSIS — R202 Paresthesia of skin: Secondary | ICD-10-CM

## 2020-05-10 DIAGNOSIS — R200 Anesthesia of skin: Secondary | ICD-10-CM | POA: Diagnosis not present

## 2020-05-10 DIAGNOSIS — M25512 Pain in left shoulder: Secondary | ICD-10-CM

## 2020-05-10 DIAGNOSIS — G8929 Other chronic pain: Secondary | ICD-10-CM

## 2020-05-10 MED ORDER — AMLODIPINE BESYLATE 10 MG PO TABS: ORAL_TABLET | ORAL | 3 refills | Status: DC

## 2020-05-10 MED ORDER — SHINGRIX 50 MCG/0.5ML IM SUSR: 0.5000 mL | Freq: Once | INTRAMUSCULAR | 1 refills | Status: AC

## 2020-05-10 MED ORDER — METOPROLOL SUCCINATE ER 100 MG PO TB24: ORAL_TABLET | ORAL | 3 refills | Status: DC

## 2020-05-10 NOTE — Patient Instructions (Signed)
Sent in refills of your medication  Follow up in summer time  Take shingles shot prescription to your pharmacy Let us know when you've had the vaccine  Be well, Dr. Pollie Meyer

## 2020-05-10 NOTE — Progress Notes (Signed)
  Date of Visit: 05/10/2020   SUBJECTIVE:   HPI:  Laura Griffin presents today for routine follow up.  Hypertension - taking amlodipine 10mg  daily and metoprolol XL 100mg  daily  GERD - has been off protonix and is doing well without any medications  Neuropathy - takin ggabapentin 800mg  three times daily with good results  L shoulder pain - seen for this about a year ago and given rx for voltaren. Has not helped much.  She also saw Dr. for knee pain and had fluid drawn off and a steroid shot done. That has helped a lot. Has upcoming follow up with Dr. for it.   OBJECTIVE:   BP 110/62   Pulse (!) 57   Ht 5\' 4"  (1.626 m)   Wt 174 lb 6.4 oz (79.1 kg)   LMP 04/02/1996   SpO2 99%   BMI 29.94 kg/m  Gen: no acute distress, pleasant, cooperative HEENT: normocephalic, atraumatic  Heart: regular rate and rhythm, no murmur Lungs: clear to auscultation bilaterally, normal work of breathing  Neuro: alert, speech normal, grossly nonfocal Ext: full ROM of L shoulder in all directions. Mild tenderness of muscles of L shoulder. Negative empty can. Pos neers  ASSESSMENT/PLAN:   Health maintenance:  -given rx for shingrix to get at her pharmacy  GERD (gastroesophageal reflux disease) Doing well off protonix, continue to monitor  HYPERTENSION, BENIGN SYSTEMIC Well controlled. Continue current medication regimen.   Numbness and tingling of right leg Doing well, continue gabapentin  Shoulder pain Reviewed options including MRI, xray, ortho referral, etc Seems largely limited to soft tissue After discussion patient decided to wait for her follow up ortho appointment with Dr. August Griffin to discuss this issue  FOLLOW UP: Follow up in summertime for chronic medical problems  Laura Griffin J. , MD [facility]

## 2020-05-14 NOTE — Assessment & Plan Note (Signed)
Doing well off protonix, continue to monitor

## 2020-05-14 NOTE — Assessment & Plan Note (Signed)
Doing well, continue gabapentin

## 2020-05-14 NOTE — Assessment & Plan Note (Signed)
Reviewed options including MRI, xray, ortho referral, etc Seems largely limited to soft tissue After discussion patient decided to wait for her follow up ortho appointment with Dr. August Saucer to discuss this issue

## 2020-05-14 NOTE — Assessment & Plan Note (Signed)
Well controlled. Continue current medication regimen.  

## 2020-05-25 ENCOUNTER — Other Ambulatory Visit: Payer: Self-pay

## 2020-05-25 ENCOUNTER — Ambulatory Visit (INDEPENDENT_AMBULATORY_CARE_PROVIDER_SITE_OTHER): Payer: Medicare Other | Admitting: Orthopedic Surgery

## 2020-05-25 DIAGNOSIS — M1711 Unilateral primary osteoarthritis, right knee: Secondary | ICD-10-CM

## 2020-05-29 ENCOUNTER — Encounter: Payer: Self-pay | Admitting: Orthopedic Surgery

## 2020-05-29 NOTE — Progress Notes (Signed)
Office Visit Note   Patient: Laura Griffin           Date of Birth: 08/31/1945           MRN: [id number] Visit Date: 05/25/2020 Requested by: Latrelle Dodrill, MD [address] [place],  [place] [address] PCP: Latrelle Dodrill, MD  Subjective: Chief Complaint  Patient presents with  . Right Knee - Follow-up    HPI: Laura Griffin is a 75 year old patient underwent right knee injection 04/28/2020.  She is doing better.  She did get relief from the injection.  Her pain is less intense.  Consideration at this time was for or against repeat injection particularly considering gel injection.  Overall however she is improved.              ROS: All systems reviewed are negative as they relate to the chief complaint within the history of present illness.  Patient denies  fevers or chills.   Assessment & Plan: Visit Diagnoses:  1. Arthritis of right knee     Plan: Impression is improvement in right knee pain following injection.  [name] wants to avoid knee replacement pretty much at all cost.  We could consider alternating gel and cortisone injections for her to maintain some degree of pain relief.  She will come back when she is having recurrent pain in that knee and we can move forward with that plan.  Follow-up as needed.  This patient is diagnosed with osteoarthritis of the knee(s).    Radiographs show evidence of joint space narrowing, osteophytes, subchondral sclerosis and/or subchondral cysts.  This patient has knee pain which interferes with functional and activities of daily living.    This patient has experienced inadequate response, adverse effects and/or intolerance with conservative treatments such as acetaminophen, NSAIDS, topical creams, physical therapy or regular exercise, knee bracing and/or weight loss.   This patient has experienced inadequate response or has a contraindication to intra articular steroid injections for at least 3 months.   This patient is not  scheduled to have a total knee replacement within 6 months of starting treatment with viscosupplementation.   Follow-Up Instructions: Return if symptoms worsen or fail to improve.   Orders:  No orders of the defined types were placed in this encounter.  No orders of the defined types were placed in this encounter.     Procedures: No procedures performed   Clinical Data: No additional findings.  Objective: Vital Signs: LMP 04/02/1996   Physical Exam:   Constitutional: Patient appears well-developed HEENT:  Head: Normocephalic Eyes:EOM are normal Neck: Normal range of motion Cardiovascular: Normal rate Pulmonary/chest: Effort normal Neurologic: Patient is alert Skin: Skin is warm Psychiatric: Patient has normal mood and affect    Ortho Exam: Ortho exam demonstrates slightly antalgic gait to the right.  Range of motion is unchanged from prior visit.  No effusion.  Collateral and cruciate ligaments are stable.  Pedal pulses palpable.  No groin pain with internal or external Tatian of the leg.  No other masses lymphadenopathy or skin changes noted in that right knee region.  Symmetric patellofemoral crepitus is present  Specialty Comments:  No specialty comments available.  Imaging: No results found.   PMFS History: Patient Active Problem List   Diagnosis Date Noted  . Ganglion cyst of dorsum of right wrist 12/15/2018  . Lichen sclerosus of female genitalia 09/25/2018    Class: Chronic  . Hyperlipidemia 03/17/2018  . Lichen planus 03/17/2018  . Numbness and tingling  of right leg 10/19/2016  . Shoulder pain 03/01/2016  . Headache 03/01/2016  . Asymmetric SNHL (sensorineural hearing loss) 01/10/2016  . Tinnitus of both ears 01/10/2016  . Hearing loss 10/07/2015  . Thyroid nodule 05/24/2015  . Globus sensation 05/05/2015  . Neck pain 01/07/2015  . Allergic rhinitis 07/19/2014  . GERD (gastroesophageal reflux disease) 07/19/2014  . Pitting edema 07/19/2014  .  Swallowing difficulty 07/06/2013  . Osteoarthritis of right knee 12/26/2012  . GLAUCOMA 11/04/2009  . DDD (degenerative disc disease), lumbar 05/10/2009  . URGE INCONTINENCE 06/01/2008  . Overweight (BMI 25.0-29.9) 05/30/2006  . HYPERTENSION, BENIGN SYSTEMIC 05/30/2006  . Osteoarthritis 05/30/2006   Past Medical History:  Diagnosis Date  . Allergic rhinitis 07/19/2014  . Arthritis of knee, right 03/11/2006  . Bursitis of shoulder, right 09/19/1999  . Cervical radiculopathy at C6 05/24/2011   Beginning Nov 2012. Multi-level foraminal narrowing in C-spine plain films   . Diverticulosis   . DIVERTICULOSIS OF COLON 05/30/2006   Colonoscopy 06/2014 - multiple diverticula, no other abnormalities, no further colonoscopies recommended given patient's age of 52    . GERD (gastroesophageal reflux disease) 07/19/2014   Diagnosed clinically by [facility] in January of 2016.    . Glaucoma   . Hearing decreased 08/07/2011   With whooshing tinnitus Mild bilateral loss on 08/2012 audiology who will recheck her hearing in one year    . History of herniated intervertebral disc   . Hypertension   . HYPERTENSION, BENIGN SYSTEMIC 05/30/2006       . Left sided sciatica 8/95,11/00   CT confirmed   . LOW BACK PAIN SYNDROME 05/10/2009   Qualifier: Diagnosis of  By: Lorenda Hatchet CMA,, Thekla  Mild C 3-4, 4-5 lumbar spinal stenosis MRI 08/2011, Diffuse lower thoracic and lumbar DDD   . Mitral valve prolapse    per patient heart murmur  . Orthostatic hypotension 06/12/2013  . Osteoarthritis   . Osteoarthritis of right knee 12/26/2012   Confirmed on xray 07/2014   . OSTEOARTHRITIS, MULTI SITES 05/30/2006   She is stoic about pain and wants to avoid surgeries Past xrays have documented DJD in right knee and lumbar spine    . Overweight (BMI 25.0-29.9) 05/30/2006  . Pitting edema 07/19/2014   Bilateral 1+ pitting edema of calves first noted on 07/19/2014.    . Sciatica 05/30/2006   Qualifier: Diagnosis of  By: Sheffield Slider MD,  Wayne  Involved left leg in 02/1999   . Swallowing difficulty 07/06/2013  . Urge incontinence 06/01/2008   Qualifier: Diagnosis of  By: Sheffield Slider MD, Deniece Portela      Family History  Problem Relation Age of Onset  . Breast cancer Sister        55  . Breast cancer Sister 88  . Hypertension Father   . Heart disease Father   . Stroke Mother 28       cerebral hemorrhage  . Hypertension Mother   . Breast cancer Maternal Aunt     Past Surgical History:  Procedure Laterality Date  . CATARACT EXTRACTION  02/2011   left eye  . TUBAL LIGATION  1973  . VULVA /PERINEUM BIOPSY  2010   for hypopigmentation, non-specific result   Social History   Occupational History  . 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 Smoking status: Former Smoker    Packs/day: 0.50    Years: 15.00    Pack years: 7.50    Types:  Cigarettes    Quit date: 04/02/1994    Years since quitting: 26.1  . Smokeless tobacco: Never Used  Vaping Use  . Vaping Use: Never used  Substance and Sexual Activity  . Alcohol use: No  . Drug use: No  . Sexual activity: Not Currently    Partners: Male    Birth control/protection: Surgical    Comment: BTSP

## 2020-05-30 NOTE — Progress Notes (Signed)
See below from Dr Dean. Thanks.  

## 2020-05-30 NOTE — Progress Notes (Signed)
Noted  

## 2020-06-02 ENCOUNTER — Telehealth: Payer: Self-pay

## 2020-06-02 NOTE — Telephone Encounter (Signed)
Submitted VOB for Monovisc, right knee. Pending BV 

## 2020-07-04 DIAGNOSIS — H401133 Primary open-angle glaucoma, bilateral, severe stage: Secondary | ICD-10-CM | POA: Diagnosis not present

## 2020-07-04 DIAGNOSIS — H5010 Unspecified exotropia: Secondary | ICD-10-CM | POA: Diagnosis not present

## 2020-07-29 ENCOUNTER — Other Ambulatory Visit: Payer: Self-pay | Admitting: Family Medicine

## 2020-08-02 ENCOUNTER — Telehealth: Payer: Self-pay

## 2020-08-02 NOTE — Telephone Encounter (Signed)
Approved for Monovisc, right knee. [place] has been met Secondary insurance [employer] will pick up remaining eligible expenses at 100%. No Co-pay No PA required  Appt. 08/12/2020 with Dr. Marlou Sa

## 2020-08-12 ENCOUNTER — Ambulatory Visit (INDEPENDENT_AMBULATORY_CARE_PROVIDER_SITE_OTHER): Payer: Medicare Other | Admitting: Orthopedic Surgery

## 2020-08-12 DIAGNOSIS — M1711 Unilateral primary osteoarthritis, right knee: Secondary | ICD-10-CM

## 2020-08-13 ENCOUNTER — Encounter: Payer: Self-pay | Admitting: Orthopedic Surgery

## 2020-08-13 DIAGNOSIS — M1711 Unilateral primary osteoarthritis, right knee: Secondary | ICD-10-CM

## 2020-08-13 MED ORDER — LIDOCAINE HCL 1 % IJ SOLN
5.0000 mL | INTRAMUSCULAR | Status: AC | PRN
  Administered 2020-08-13: 5 mL

## 2020-08-13 MED ORDER — HYALURONAN 88 MG/4ML IX SOSY
88.0000 mg | PREFILLED_SYRINGE | INTRA_ARTICULAR | Status: AC | PRN
Start: 2020-08-13 — End: 2020-08-13
  Administered 2020-08-13: 88 mg via INTRA_ARTICULAR

## 2020-08-13 NOTE — Progress Notes (Signed)
   Procedure Note  Patient: Laura Griffin             Date of Birth: 17-Nov-1945           MRN: [id number]             Visit Date: 08/12/2020  Procedures: Visit Diagnoses:  1. Arthritis of right knee     Large Joint Inj: R knee on 08/13/2020 7:43 AM Indications: pain, joint swelling and diagnostic evaluation Details: 18 G 1.5 in needle, superolateral approach  Arthrogram: No  Medications: 5 mL lidocaine 1 %; 88 mg Hyaluronan 88 MG/4ML Outcome: tolerated well, no immediate complications Procedure, treatment alternatives, risks and benefits explained, specific risks discussed. Consent was given by the patient. Immediately prior to procedure a time out was called to verify the correct patient, procedure, equipment, support staff and site/side marked as required. Patient was prepped and draped in the usual sterile fashion.     Patient has had good result with the Monovisc injections in the past with diminished activities of daily living pain.  She has had recurrent symptoms despite that relief.  She would benefit from gel injection in the near future in approximately 6 months. This patient is diagnosed with osteoarthritis of the knee(s).    Radiographs show evidence of joint space narrowing, osteophytes, subchondral sclerosis and/or subchondral cysts.  This patient has knee pain which interferes with functional and activities of daily living.    This patient has experienced inadequate response, adverse effects and/or intolerance with conservative treatments such as acetaminophen, NSAIDS, topical creams, physical therapy or regular exercise, knee bracing and/or weight loss.   This patient has experienced inadequate response or has a contraindication to intra articular steroid injections for at least 3 months.   This patient is not scheduled to have a total knee replacement within 6 months of starting treatment with viscosupplementation.

## 2020-08-15 NOTE — Progress Notes (Signed)
Noted. Will submit in October, 2022 

## 2020-08-30 ENCOUNTER — Telehealth: Payer: Self-pay

## 2020-08-30 NOTE — Telephone Encounter (Signed)
Patient calls nurse line stating she is scheduled for a diagnostic mammogram on Friday, however they need "instructions" from PCP. Patient is following up from biopsy last year. I am assuming they need an order. The order needs to be placed for [facility]. Please advise.

## 2020-09-07 DIAGNOSIS — R921 Mammographic calcification found on diagnostic imaging of breast: Secondary | ICD-10-CM | POA: Diagnosis not present

## 2020-09-07 DIAGNOSIS — Z23 Encounter for immunization: Secondary | ICD-10-CM | POA: Diagnosis not present

## 2020-09-09 NOTE — Telephone Encounter (Signed)
Had mammo completed, orders were faxed to me, have signed and faxed back Latrelle Dodrill, MD

## 2020-09-13 ENCOUNTER — Encounter: Payer: Self-pay | Admitting: Family Medicine

## 2020-09-19 ENCOUNTER — Other Ambulatory Visit: Payer: Self-pay

## 2020-09-19 ENCOUNTER — Ambulatory Visit (INDEPENDENT_AMBULATORY_CARE_PROVIDER_SITE_OTHER): Payer: Medicare Other | Admitting: Orthopedic Surgery

## 2020-09-19 ENCOUNTER — Telehealth: Payer: Self-pay

## 2020-09-19 DIAGNOSIS — M1711 Unilateral primary osteoarthritis, right knee: Secondary | ICD-10-CM

## 2020-09-19 NOTE — Telephone Encounter (Signed)
Can we get auth for right knee synvisc one?

## 2020-09-20 NOTE — Telephone Encounter (Signed)
Noted  

## 2020-09-23 NOTE — Telephone Encounter (Signed)
Patient received Monovisc in her right knee on 08/12/2020. Did you want it for 6 months?

## 2020-09-23 NOTE — Telephone Encounter (Signed)
Noted  

## 2020-09-25 ENCOUNTER — Encounter: Payer: Self-pay | Admitting: Orthopedic Surgery

## 2020-09-25 NOTE — Progress Notes (Addendum)
Office Visit Note   Patient: Laura Griffin           Date of Birth: 1945-10-16           MRN: [id number] Visit Date: 09/19/2020 Requested by: Latrelle Dodrill, MD [address] [place],  [place] [address] PCP: Latrelle Dodrill, MD  Subjective: Chief Complaint  Patient presents with   Right Knee - Pain    HPI: Halona is a patient with right knee pain.  Had injection 08/12/2020.  Not too much relief.  She feels worse and she is ambulating with a cane.  She is having issues she was not having previously.  Feels like the knee is twisted at times.  She has severe end-stage lateral compartment arthritis.  Denies any swelling or fever.  Takes Tylenol for pain.  Uses a knee sleeve sometimes.  She has tried Therapist, sports.  Old x-rays were reviewed and she does have essentially total knee replacement type of arthritis.              ROS: All systems reviewed are negative as they relate to the chief complaint within the history of present illness.  Patient denies  fevers or chills.   Assessment & Plan: Visit Diagnoses:  1. Arthritis of right knee     Plan: Impression is right knee arthritis.  She does not really want to get knee replacement surgery which is understandable.  Nonetheless she has end-stage arthritis.  Nothing to do arthroscopically.  Gel injection may help give her some temporary relief.  We will get that set up and see her back in a month for injection. This patient is diagnosed with osteoarthritis of the knee(s).    Radiographs show evidence of joint space narrowing, osteophytes, subchondral sclerosis and/or subchondral cysts.  This patient has knee pain which interferes with functional and activities of daily living.    This patient has experienced inadequate response, adverse effects and/or intolerance with conservative treatments such as acetaminophen, NSAIDS, topical creams, physical therapy or regular exercise, knee bracing and/or weight loss.   This patient  has experienced inadequate response or has a contraindication to intra articular steroid injections for at least 3 months.   This patient is not scheduled to have a total knee replacement within 6 months of starting treatment with viscosupplementation.  Follow-Up Instructions: Return in about 4 weeks (around 10/17/2020).   Orders:  No orders of the defined types were placed in this encounter.  No orders of the defined types were placed in this encounter.     Procedures: No procedures performed   Clinical Data: No additional findings.  Objective: Vital Signs: LMP 04/02/1996   Physical Exam:   Constitutional: Patient appears well-developed HEENT:  Head: Normocephalic Eyes:EOM are normal Neck: Normal range of motion Cardiovascular: Normal rate Pulmonary/chest: Effort normal Neurologic: Patient is alert Skin: Skin is warm Psychiatric: Patient has normal mood and affect   Ortho Exam: Ortho exam demonstrates slight valgus alignment right lower extremity with trace effusion.  No groin pain with internal ex rotation of leg.  Pedal pulses palpable.  Ankle dorsiflexion intact.  Patient has lateral greater than medial joint line tenderness with intact extensor mechanism and range of motion of about 5 to 95 degrees  Specialty Comments:  No specialty comments available.  Imaging: No results found.   PMFS History: Patient Active Problem List   Diagnosis Date Noted   Ganglion cyst of dorsum of right wrist 12/15/2018   Lichen sclerosus of female genitalia  09/25/2018    Class: Chronic   Hyperlipidemia 03/17/2018   Lichen planus 03/17/2018   Numbness and tingling of right leg 10/19/2016   Shoulder pain 03/01/2016   Headache 03/01/2016   Asymmetric SNHL (sensorineural hearing loss) 01/10/2016   Tinnitus of both ears 01/10/2016   Hearing loss 10/07/2015   Thyroid nodule 05/24/2015   Globus sensation 05/05/2015   Neck pain 01/07/2015   Allergic rhinitis 07/19/2014   GERD  (gastroesophageal reflux disease) 07/19/2014   Pitting edema 07/19/2014   Swallowing difficulty 07/06/2013   Osteoarthritis of right knee 12/26/2012   GLAUCOMA 11/04/2009   DDD (degenerative disc disease), lumbar 05/10/2009   URGE INCONTINENCE 06/01/2008   Overweight (BMI 25.0-29.9) 05/30/2006   HYPERTENSION, BENIGN SYSTEMIC 05/30/2006   Osteoarthritis 05/30/2006   Past Medical History:  Diagnosis Date   Allergic rhinitis 07/19/2014   Arthritis of knee, right 03/11/2006   Bursitis of shoulder, right 09/19/1999   Cervical radiculopathy at C6 05/24/2011   Beginning Nov 2012. Multi-level foraminal narrowing in C-spine plain films    Diverticulosis    DIVERTICULOSIS OF COLON 05/30/2006   Colonoscopy 06/2014 - multiple diverticula, no other abnormalities, no further colonoscopies recommended given patient's age of 37     GERD (gastroesophageal reflux disease) 07/19/2014   Diagnosed clinically by [facility] in January of 2016.     Glaucoma    Hearing decreased 08/07/2011   With whooshing tinnitus Mild bilateral loss on 08/2012 audiology who will recheck her hearing in one year     History of herniated intervertebral disc    Hypertension    HYPERTENSION, BENIGN SYSTEMIC 05/30/2006        Left sided sciatica 8/95,11/00   CT confirmed    LOW BACK PAIN SYNDROME 05/10/2009   Qualifier: Diagnosis of  By: Lorenda Hatchet CMA,, [place]  Mild C 3-4, 4-5 lumbar spinal stenosis MRI 08/2011, Diffuse lower thoracic and lumbar DDD    Mitral valve prolapse    per patient heart murmur   Orthostatic hypotension 06/12/2013   Osteoarthritis    Osteoarthritis of right knee 12/26/2012   Confirmed on xray 07/2014    OSTEOARTHRITIS, MULTI SITES 05/30/2006   She is stoic about pain and wants to avoid surgeries Past xrays have documented DJD in right knee and lumbar spine     Overweight (BMI 25.0-29.9) 05/30/2006   Pitting edema 07/19/2014   Bilateral 1+ pitting edema of calves first noted on 07/19/2014.     Sciatica  05/30/2006   Qualifier: Diagnosis of  By: Sheffield Slider MD, Wayne  Involved left leg in 02/1999    Swallowing difficulty 07/06/2013   Urge incontinence 06/01/2008   Qualifier: Diagnosis of  By: Sheffield Slider MD, Deniece Portela      Family History  Problem Relation Age of Onset   Breast cancer Sister        1996   Breast cancer Sister 24   Hypertension Father    Heart disease Father    Stroke Mother 84       cerebral hemorrhage   Hypertension Mother    Breast cancer Maternal Aunt     Past Surgical History:  Procedure Laterality Date   CATARACT EXTRACTION  02/2011   left eye   TUBAL LIGATION  1973   VULVA /PERINEUM BIOPSY  2010   for hypopigmentation, non-specific result   Social History   Occupational History   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

## 2020-11-08 ENCOUNTER — Ambulatory Visit (INDEPENDENT_AMBULATORY_CARE_PROVIDER_SITE_OTHER): Payer: Medicare Other | Admitting: Family Medicine

## 2020-11-08 ENCOUNTER — Other Ambulatory Visit: Payer: Self-pay

## 2020-11-08 ENCOUNTER — Encounter: Payer: Self-pay | Admitting: Family Medicine

## 2020-11-08 VITALS — BP 148/82 | HR 50 | Wt 176.0 lb

## 2020-11-08 DIAGNOSIS — I10 Essential (primary) hypertension: Secondary | ICD-10-CM | POA: Diagnosis not present

## 2020-11-08 DIAGNOSIS — G8929 Other chronic pain: Secondary | ICD-10-CM | POA: Diagnosis not present

## 2020-11-08 DIAGNOSIS — M791 Myalgia, unspecified site: Secondary | ICD-10-CM | POA: Diagnosis not present

## 2020-11-08 DIAGNOSIS — M25512 Pain in left shoulder: Secondary | ICD-10-CM

## 2020-11-08 DIAGNOSIS — E785 Hyperlipidemia, unspecified: Secondary | ICD-10-CM

## 2020-11-08 DIAGNOSIS — M549 Dorsalgia, unspecified: Secondary | ICD-10-CM

## 2020-11-08 MED ORDER — BACLOFEN 10 MG PO TABS: 5.0000 mg | ORAL_TABLET | Freq: Two times a day (BID) | ORAL | 0 refills | Status: DC | PRN

## 2020-11-08 NOTE — Progress Notes (Signed)
SUBJECTIVE:   CHIEF COMPLAINT / HPI:   Laura Griffin presents today for her a follow up office visit.  Dark spots on left upper arm: Versia has noticed a few hyperpigmented spots on her left arm. They do not itch, burn or change in size, but would like them checked to rule out possible cancer. No significant sun exposure in this area.  Pain on right side: Laura Griffin has been having pain on her right side 2-3 times a week for 15 minutes 2-3 times a day. She says it is a feeling of fullness that goes from her right side to her back in a crescent shape. She finds it worsens with exertion and is so bad she needs to sit down. She often finds relief when she sits down. She has not noted any chest pain or pain in the front of her abdomen. Denies associate shortness of breath, nausea, or sweating.  Pain in shoulders/upper arms: Laura Griffin says it occurs almost daily and feels like a twinge. She has a history of pinched nerves but says this feels different. This pain tends to persist all day and is a 7/8 out of 10. She has not noticed if it is related to any activities but it does prevent her from doing much when she is hurting.  Pain in arm: Laura Griffin says this pain is different/does not correlate than that of her shoulders. It often starts at the top of her upper arm and radiates down. It is often a tingling sensation and progresses to a 7/8 out of 10 severity in pain. She feels it comes on suddenly  and she is not able to engage in activities she wants when it occurs. She has thought about using Advil because it has worked for her in the past but wanted to check in before making any adjustments since she is on a tylenol pain management regimen for her osteoarthritis.  PERTINENT  PMH / PSH:  Hypertension GERD Osteoarthritis   OBJECTIVE:   BP (!) 148/82   Pulse (!) 50   Wt 176 lb (79.8 kg)   LMP 04/02/1996   SpO2 100%   BMI 30.21 kg/m   GEN: Well appearing, in no acute distress, alert and oriented  x4 HEENT: Normocephalic, atraumatic.  CAR: Normal rate and rhythm. No murmurs, gallops, rubs or thrills PULM: Clear to auscultation, normal work of breathing GI: Soft, nontender to palpation, no distention NEUR: Gross motor and sensation intact MSK: Right upper arm is painful in biceps area upon pronation. Pain in both arms when elevating above head. Pain is localized to middle of the arm bilaterally and elicited by different movements on each arm. Pain is decreased when arm is passively moved but still present.  Abdomen: Pain on right side (midway between hip and axilla) is reproduced with palpation of abdominal wall/flank musculature. No abdominal tenderness. Negative murphy sign.  ASSESSMENT/PLAN:   HYPERTENSION, BENIGN SYSTEMIC BP was slightly elevated in the office today, with better readings reported at home. Instructed blood pressure measurements at home and to contact our office if any have a systolic pressure above 244. Otherwise continue present hypertension regimen  Shoulder pain Prescribed low dose baclofen to alleviate muscle pain. Obtaining ESR to evaluate for possible polymyalgia rheumatica given age and proximal muscle aches. If unrevealing will likely recommend ortho evaluation  Right-sided back pain Suspect musculoskeletal etiology. Low concern for cardiac etiology given location of pain and reproduction of pain with palpation. Rx for baclofen, also can try heating pad/ice pack.  Return if not improving.  Hyperlipidemia Update direct LDL today, patient desires to avoid lipid medication if at all possible  [place]    Patient seen along with [employer] student Cristobal Goldmann. I personally evaluated this patient along with the student, and verified all aspects of the history, physical exam, and medical decision making as documented by the student. I agree with the student's documentation and have made all necessary  edits.  Chrisandra Netters, MD  [place]

## 2020-11-08 NOTE — Assessment & Plan Note (Signed)
Update direct LDL today, patient desires to avoid lipid medication if at all possible

## 2020-11-08 NOTE — Assessment & Plan Note (Addendum)
Prescribed low dose baclofen to alleviate muscle pain. Obtaining ESR to evaluate for possible polymyalgia rheumatica given age and proximal muscle aches. If unrevealing will likely recommend ortho evaluation

## 2020-11-08 NOTE — Assessment & Plan Note (Addendum)
BP was slightly elevated in the office today, with better readings reported at home. Instructed blood pressure measurements at home and to contact our office if any have a systolic pressure above 140. Otherwise continue present hypertension regimen

## 2020-11-08 NOTE — Assessment & Plan Note (Addendum)
Suspect musculoskeletal etiology. Low concern for cardiac etiology given location of pain and reproduction of pain with palpation. Rx for baclofen, also can try heating pad/ice pack. Return if not improving.

## 2020-11-08 NOTE — Patient Instructions (Addendum)
It was great to see you again today!  Scheduled for dermatology clinic in September - you can discuss biopsy with them there.  Try low dose baclofen (muscle relaxer)  Checking labs today: sed rate (inflammation marker) and cholesterol  Follow up with me if not getting better with the baclofen  Be well, Dr. Pollie Meyer

## 2020-11-09 LAB — SEDIMENTATION RATE: Sed Rate: 8 mm/hr (ref 0–40)

## 2020-11-09 LAB — LDL CHOLESTEROL, DIRECT: LDL Direct: 87 mg/dL (ref 0–99)

## 2020-11-16 ENCOUNTER — Encounter: Payer: Self-pay | Admitting: Family Medicine

## 2020-12-08 ENCOUNTER — Ambulatory Visit (INDEPENDENT_AMBULATORY_CARE_PROVIDER_SITE_OTHER): Payer: Medicare Other | Admitting: Family Medicine

## 2020-12-08 ENCOUNTER — Other Ambulatory Visit: Payer: Self-pay

## 2020-12-08 DIAGNOSIS — L989 Disorder of the skin and subcutaneous tissue, unspecified: Secondary | ICD-10-CM | POA: Insufficient documentation

## 2020-12-08 NOTE — Patient Instructions (Signed)
It was wonderful to meet you today. Thank you for allowing me to be a part of your care. Below is a short summary of what we discussed at your visit today:  Skin lesion on wrist - This looks benign and I do not believe we need to do anything about it - You may use Vaseline to keep it hydrated - Call us if it becomes itchy again, we can call in a steroid cream at that time - Call us if it worsens or spreads   If you have any questions or concerns, please do not hesitate to contact us via phone or MyChart message.   Fayette Pho, MD

## 2020-12-08 NOTE — Assessment & Plan Note (Signed)
Initially pruritic and erythematous, now resolved with a scab.  Differential includes folliculitis versus insect bite.  As this is no longer pruritic, will not prescribe any steroid cream at this time.  Recommend Vaseline for hydration until healed.

## 2020-12-08 NOTE — Progress Notes (Signed)
    SUBJECTIVE:   CHIEF COMPLAINT / HPI:   Dark spots on left upper arm - presented to PCP Dr. Pollie Meyer 8/9, referred to derm clinic, provider notes below: "Jacquetta has noticed a few hyperpigmented spots on her left arm. They do not itch, burn or change in size, but would like them checked to rule out possible cancer. No significant sun exposure in this area." - today in clinic, she reports the three spots on her left arm fell off and are no longer present - faint hyperpigmentation left behind, no palpable defects  - she would, however, like a lesion on her right wrist examined, unknown duration - initially pruritic, now not bothering her  PERTINENT  PMH / PSH: HTN, GERD, thyroid nodule, degenerative disc disease, lichen planus, lichen sclerosis, ganglion cyst of right wrist dorsum, HLD  OBJECTIVE:   BP (!) 176/89   Pulse (!) 44   Wt 176 lb 9.6 oz (80.1 kg)   LMP 04/02/1996   SpO2 96%   BMI 30.31 kg/m   PHQ-9:  Depression screen [facility] 2/9 11/08/2020 05/10/2020 02/17/2020  Decreased Interest 0 0 0  Down, Depressed, Hopeless 0 0 0  PHQ - 2 Score 0 0 0  Altered sleeping 0 0 0  Tired, decreased energy 1 1 1   Change in appetite 0 0 0  Feeling bad or failure about yourself  0 0 0  Trouble concentrating 0 0 0  Moving slowly or fidgety/restless 0 0 0  Suicidal thoughts 0 0 0  PHQ-9 Score 1 1 1   Difficult doing work/chores Somewhat difficult Somewhat difficult -  Some recent data might be hidden     GAD-7: No flowsheet data found.   Physical Exam General: Awake, alert, oriented, no acute distress Respiratory: Unlabored respirations, speaking in full sentences, no respiratory distress Extremities: Small hyperpigmented papule on ventral right wrist with small scab overlying    ASSESSMENT/PLAN:   Skin lesion Initially pruritic and erythematous, now resolved with a scab.  Differential includes folliculitis versus insect bite.  As this is no longer pruritic, will not prescribe any  steroid cream at this time.  Recommend Vaseline for hydration until healed.     , MD [facility]

## 2020-12-12 ENCOUNTER — Telehealth: Payer: Self-pay

## 2020-12-12 ENCOUNTER — Encounter (HOSPITAL_COMMUNITY): Payer: Self-pay | Admitting: Emergency Medicine

## 2020-12-12 ENCOUNTER — Ambulatory Visit (HOSPITAL_COMMUNITY)
Admission: EM | Admit: 2020-12-12 | Discharge: 2020-12-12 | Disposition: A | Discharge status: Home / Self Care | Payer: Medicare Other | Attending: Internal Medicine | Admitting: Internal Medicine

## 2020-12-12 ENCOUNTER — Other Ambulatory Visit: Payer: Self-pay

## 2020-12-12 DIAGNOSIS — I10 Essential (primary) hypertension: Secondary | ICD-10-CM | POA: Diagnosis not present

## 2020-12-12 LAB — BASIC METABOLIC PANEL
Anion gap: 10 (ref 5–15)
BUN: 10 mg/dL (ref 8–23)
CO2: 27 mmol/L (ref 22–32)
Calcium: 9.9 mg/dL (ref 8.9–10.3)
Chloride: 102 mmol/L (ref 98–111)
Creatinine, Ser: 0.93 mg/dL (ref 0.44–1.00)
GFR, Estimated: >60 mL/min (ref >60)
Glucose, Bld: 84 mg/dL (ref 70–99)
Potassium: 3.5 mmol/L (ref 3.5–5.1)
Sodium: 139 mmol/L (ref 135–145)

## 2020-12-12 MED ORDER — LOSARTAN POTASSIUM 25 MG PO TABS: 25.0000 mg | ORAL_TABLET | Freq: Every day | ORAL | 0 refills | Status: DC

## 2020-12-12 NOTE — Discharge Instructions (Addendum)
Please take your medications as prescribed If you have dizziness, lightheadedness or feeling of faintness please return to the urgent care to be reevaluated. We will call you with recommendations if labs are abnormal.

## 2020-12-12 NOTE — Telephone Encounter (Signed)
Patient calls nurse line regarding concerns with elevated BP. Reports that this morning BP is 198/114. Reports having headache and lightheadedness. Advised that patient be seen in UC as we do not have any available appointments for today. Patient verbalizes understanding.   Veronda Prude, RN

## 2020-12-12 NOTE — ED Provider Notes (Signed)
[facility]    CSN: [id number] Arrival date & time: 12/12/20  1217      History   Chief Complaint Chief Complaint  Patient presents with   Hypertension    HPI Laura Griffin is a 75 y.o. female comes to the urgent care for mild to moderate headache associated with elevated blood pressure.  Patient has a history of hypertension currently managed on metoprolol and amlodipine.  A couple of years ago patient was on hydrochlorothiazide in addition to the above-mentioned medications but hydrochlorothiazide was discontinued because of hypercalcemia.  Over the past few weeks patient says her blood pressure has remained elevated with the systolic blood pressure ranging from 150s to 200s.  No chest pain or chest pressure.  No shortness of breath.  No lower extremity swelling.  No abdominal pain.  Headache is generalized without light sensitivity.  No nausea or vomiting.  She has not tried any over-the-counter medications for the headaches. HPI  Past Medical History:  Diagnosis Date   Allergic rhinitis 07/19/2014   Arthritis of knee, right 03/11/2006   Bursitis of shoulder, right 09/19/1999   Cervical radiculopathy at C6 05/24/2011   Beginning Nov 2012. Multi-level foraminal narrowing in C-spine plain films    Diverticulosis    DIVERTICULOSIS OF COLON 05/30/2006   Colonoscopy 06/2014 - multiple diverticula, no other abnormalities, no further colonoscopies recommended given patient's age of 75     GERD (gastroesophageal reflux disease) 07/19/2014   Diagn[facility] in January of 2016.     Glaucoma    Hearing decreased 08/07/2011   With whooshing tinnitus Mild bilateral loss on 08/2012 audiology who will recheck her hearing in one year     History of herniated intervertebral disc    Hypertension    HYPERTENSION, BENIGN SYSTEMIC 05/30/2006        Left sided sciatica 8/95,11/00   CT confirmed    LOW BACK PAIN SYNDROME 05/10/2009   Qualifier: Diagnosis of   By: Lorenda HatchetSlade CMA,, Thekla  Mild C 3-4, 4-5 lumbar spinal stenosis MRI 08/2011, Diffuse lower thoracic and lumbar DDD    Mitral valve prolapse    per patient heart murmur   Orthostatic hypotension 06/12/2013   Osteoarthritis    Osteoarthritis of right knee 12/26/2012   Confirmed on xray 07/2014    OSTEOARTHRITIS, MULTI SITES 05/30/2006   She is stoic about pain and wants to avoid surgeries Past xrays have documented DJD in right knee and lumbar spine     Overweight (BMI 25.0-29.9) 05/30/2006   Pitting edema 07/19/2014   Bilateral 1+ pitting edema of calves first noted on 07/19/2014.     Sciatica 05/30/2006   Qualifier: Diagnosis of  By: Sheffield SliderHale MD, Wayne  Involved left leg in 02/1999    Swallowing difficulty 07/06/2013   Urge incontinence 06/01/2008   Qualifier: Diagnosis of  By: Sheffield Sl[facility]Hale MD, [facility]      Patient Active Problem List   Diagnosis Date Noted   Skin lesion 12/08/2020   Right-sided back pain 11/08/2020   Ganglion cyst of dorsum of right wrist 12/15/2018   Lichen sclerosus of female genitalia 09/25/2018    Class: Chronic   Hyperlipidemia 03/17/2018   Lichen planus 03/17/2018   Numbness and tingling of right leg 10/19/2016   Shoulder pain 03/01/2016   Headache 03/01/2016   Asymmetric SNHL (sensorineural hearing loss) 01/10/2016   Tinnitus of both ears 01/10/2016   Hearing loss 10/07/2015   Thyroid nodule 05/24/2015   Globus sensation 05/05/2015  Neck pain 01/07/2015   Allergic rhinitis 07/19/2014   GERD (gastroesophageal reflux disease) 07/19/2014   Pitting edema 07/19/2014   Swallowing difficulty 07/06/2013   Osteoarthritis of right knee 12/26/2012   GLAUCOMA 11/04/2009   DDD (degenerative disc disease), lumbar 05/10/2009   URGE INCONTINENCE 06/01/2008   Overweight (BMI 25.0-29.9) 05/30/2006   HYPERTENSION, BENIGN SYSTEMIC 05/30/2006   Osteoarthritis 05/30/2006    Past Surgical History:  Procedure Laterality Date   CATARACT EXTRACTION  02/2011   left eye   TUBAL LIGATION  1973    VULVA /PERINEUM BIOPSY  2010   for hypopigmentation, non-specific result    OB History     Gravida  1   Para  1   Term      Preterm      AB      Living  1      SAB      IAB      Ectopic      Multiple      Live Births  1            Home Medications    Prior to Admission medications   Medication Sig Start Date End Date Taking? Authorizing Provider  losartan (COZAAR) 25 MG tablet Take 1 tablet (25 mg total) by mouth daily. 12/12/20  Yes Faria Casella, Britta Mccreedy, MD  acetaminophen (TYLENOL) 650 MG CR tablet Take 2 tablets (1,300 mg total) by mouth every 8 (eight) hours as needed. Patient taking differently: Take 1,300 mg by mouth 3 (three) times daily. 12/21/13   Uvaldo Rising, MD  amLODipine (NORVASC) 10 MG tablet TAKE 1 TABLET(10 MG) BY MOUTH AT BEDTIME 05/10/20   Latrelle Dodrill, MD  aspirin EC 81 MG tablet Take 81 mg by mouth daily.    [provider]  baclofen (LIORESAL) 10 MG tablet Take 0.5 tablets (5 mg total) by mouth 2 (two) times daily as needed for muscle spasms. 11/08/20   Latrelle Dodrill, MD  brimonidine-timolol (COMBIGAN) 0.2-0.5 % ophthalmic solution Place 2 drops into both eyes every 12 (twelve) hours.    [provider]  Cholecalciferol 25 MCG (1000 UT) capsule Take 2,000 Units by mouth daily.    [provider]  clobetasol ointment (TEMOVATE) 0.05 % Apply a thin layer to the vulva twice daily for two weeks if needed for a flare.  Apply to vulva twice a week for maintenance dosing. 03/03/20   Patton Salles, MD  gabapentin (NEURONTIN) 400 MG capsule TAKE 2 CAPSULES(800 MG) BY MOUTH THREE TIMES DAILY 07/29/20   Latrelle Dodrill, MD  latanoprost (XALATAN) 0.005 % ophthalmic solution PLACE 1 DROP INTO BOTH EYES EVERY EVENING/BEDTIME 08/15/18   [provider]  metoprolol succinate (TOPROL-XL) 100 MG 24 hr tablet TAKE 1 TABLET BY MOUTH DAILY WITH OR IMMEDIATELY FOLLOWING A MEAL 05/10/20   Latrelle Dodrill,  MD  Omega-3 Fatty Acids (FISH OIL PO) Take by mouth.    [provider]    Family History Family History  Problem Relation Age of Onset   Breast cancer Sister        23   Breast cancer Sister 65   Hypertension Father    Heart disease Father    Stroke Mother 36       cerebral hemorrhage   Hypertension Mother    Breast cancer Maternal Aunt     Social History Social History   Tobacco Use   Smoking status: Former    Packs/day:  0.50    Years: 15.00    Pack years: 7.50    Types: Cigarettes    Quit date: 04/02/1994    Years since quitting: 26.7   Smokeless tobacco: Never  Vaping Use   Vaping Use: Never used  Substance Use Topics   Alcohol use: No   Drug use: No     Allergies   Sulfamethoxazole-trimethoprim and Hctz [hydrochlorothiazide]   Review of Systems Review of Systems  Constitutional: Negative.   Respiratory: Negative.    Cardiovascular: Negative.   Gastrointestinal: Negative.   Genitourinary: Negative.   Neurological:  Positive for headaches.    Physical Exam Triage Vital Signs ED Triage Vitals  Enc Vitals Group     BP 12/12/20 1316 (!) 151/80     Pulse Rate 12/12/20 1316 (!) 52     Resp 12/12/20 1316 20     Temp 12/12/20 1316 97.8 F (36.6 C)     Temp Source 12/12/20 1316 Oral     SpO2 12/12/20 1316 94 %     Weight --      Height --      Head Circumference --      Peak Flow --      Pain Score 12/12/20 1314 8     Pain Loc --      Pain Edu? --      Excl. in GC? --    No data found.  Updated Vital Signs BP (!) 151/80 (BP Location: Left Arm)   Pulse (!) 52   Temp 97.8 F (36.6 C) (Oral)   Resp 20   LMP 04/02/1996   SpO2 94%   Visual Acuity Right Eye Distance:   Left Eye Distance:   Bilateral Distance:    Right Eye Near:   Left Eye Near:    Bilateral Near:     Physical Exam Vitals and nursing note reviewed.  Constitutional:      General: She is not in acute distress.    Appearance: She is not ill-appearing.   Cardiovascular:     Rate and Rhythm: Normal rate and regular rhythm.     Pulses: Normal pulses.     Heart sounds: Normal heart sounds.  Pulmonary:     Effort: Pulmonary effort is normal.     Breath sounds: Normal breath sounds.  Abdominal:     General: Bowel sounds are normal.     Palpations: Abdomen is soft.  Neurological:     Mental Status: She is alert.     UC Treatments / Results  Labs (all labs ordered are listed, but only abnormal results are displayed) Labs Reviewed  BASIC METABOLIC PANEL    EKG   Radiology No results found.  Procedures Procedures (including critical care time)  Medications Ordered in UC Medications - No data to display  Initial Impression / Assessment and Plan / UC Course  I have reviewed the triage vital signs and the nursing notes.  Pertinent labs & imaging results that were available during my care of the patient were reviewed by me and considered in my medical decision making (see chart for details).     1.  Uncontrolled hypertension: BMP Patient is compliant with amlodipine and metoprolol I will add losartan to the treatment regimen Patient is advised to follow-up with primary care physician for further management Hypotension precautions have been given. Return precautions given. Final Clinical Impressions(s) / UC Diagnoses   Final diagnoses:  Hypertension, uncontrolled     Discharge Instructions  Please take your medications as prescribed If you have dizziness, lightheadedness or feeling of faintness please return to the urgent care to be reevaluated. We will call you with recommendations if labs are abnormal.   ED Prescriptions     Medication Sig Dispense Auth. Provider   losartan (COZAAR) 25 MG tablet Take 1 tablet (25 mg total) by mouth daily. 30 tablet Hadyn Blanck, Britta Mccreedy, MD      PDMP not reviewed this encounter.   Merrilee Jansky, MD 12/12/20 [phone]

## 2020-12-12 NOTE — ED Triage Notes (Signed)
Pt reports saw PCP 2.5 weeks ago, reports her BP readings have been increasing over past week ranging from 150s/87 to 211/104. Pt reports talks to nurse at PCP this morning and was not able to be seen and advised to go to UC. Pt reports taking her medications as prescribed. Pt having headaches as well that has been intermittent and some lightheadedness.

## 2020-12-21 ENCOUNTER — Ambulatory Visit: Payer: Self-pay

## 2020-12-21 ENCOUNTER — Encounter: Payer: Self-pay | Admitting: Orthopedic Surgery

## 2020-12-21 ENCOUNTER — Other Ambulatory Visit: Payer: Self-pay

## 2020-12-21 ENCOUNTER — Ambulatory Visit (INDEPENDENT_AMBULATORY_CARE_PROVIDER_SITE_OTHER): Payer: Medicare Other | Admitting: Orthopedic Surgery

## 2020-12-21 DIAGNOSIS — M25511 Pain in right shoulder: Secondary | ICD-10-CM | POA: Diagnosis not present

## 2020-12-21 DIAGNOSIS — M25512 Pain in left shoulder: Secondary | ICD-10-CM | POA: Diagnosis not present

## 2020-12-21 MED ORDER — MELOXICAM 7.5 MG PO TABS: ORAL_TABLET | ORAL | 0 refills | Status: DC

## 2020-12-21 NOTE — Progress Notes (Signed)
Office Visit Note   Patient: Laura Griffin           Date of Birth: 01-Dec-1945           MRN: [id number] Visit Date: 12/21/2020 Requested by: Latrelle Dodrill, MD [address] [place],  [place] [address] PCP: Latrelle Dodrill, MD  Subjective: Chief Complaint  Patient presents with   Left Shoulder - Pain   Right Shoulder - Pain    HPI: Lima is a 75 year old patient with bilateral shoulder pain.  She is right-hand dominant.  Denies any radiation.  Reports some decreased range of motion which is painful in the deltoid region.  Denies any numbness and tingling.  Ambulates with a cane in her left hand.  Her right knee is actually improved compared to last clinic visit.  She does take gabapentin for what she was told was a pinched nerve in her neck.  Chart reviewed and she had C-spine radiographs in 2013 which showed spondylosis.  Takes Tylenol for her symptoms.              ROS: All systems reviewed are negative as they relate to the chief complaint within the history of present illness.  Patient denies  fevers or chills.   Assessment & Plan: Visit Diagnoses:  1. Bilateral shoulder pain, unspecified chronicity     Plan: Impression is bilateral shoulder pain which looks like bursitis.  No significant rotator cuff weakness today but she is a little apprehensive with exam.  No evidence of frozen shoulder.  She wants to try Mobic 7.5 mg a day for 7 days and we could consider injections if that does not help.  Injections would be subacromial injections.  Follow-Up Instructions: Return if symptoms worsen or fail to improve.   Orders:  Orders Placed This Encounter  Procedures   XR Shoulder Right   XR Shoulder Left   Meds ordered this encounter  Medications   meloxicam (MOBIC) 7.5 MG tablet    Sig: 1 PO Q D X 7 DAYS    Dispense:  21 tablet    Refill:  0      Procedures: No procedures performed   Clinical Data: No additional findings.  Objective: Vital  Signs: LMP 04/02/1996   Physical Exam:   Constitutional: Patient appears well-developed HEENT:  Head: Normocephalic Eyes:EOM are normal Neck: Normal range of motion Cardiovascular: Normal rate Pulmonary/chest: Effort normal Neurologic: Patient is alert Skin: Skin is warm Psychiatric: Patient has normal mood and affect   Ortho Exam: Ortho exam demonstrates full active and passive range of motion of the cervical spine.  She has a little bit of impingement signs positive more on the right than the left.  No discrete AC joint tenderness bilaterally.  Rotator cuff strength is good infraspinatus supraspinatus subscap muscle testing.  She has 60 degrees of external rotation passively in both shoulders.  She is not too keen on letting me rotate that shoulder at 90 degrees of abduction but that is more apprehension than any discrete pathology.  No masses lymphadenopathy or skin changes noted in either shoulder girdle region.  Specialty Comments:  No specialty comments available.  Imaging: XR Shoulder Left  Result Date: 12/21/2020 AP axillary outlet radiographs left shoulder reviewed.  Moderate AC joint arthritis is present.  Acromiohumeral distance maintained.  Shoulder is located.  No acute fracture.  Visualized lung fields clear.  No significant glenohumeral joint arthritis.  XR Shoulder Right  Result Date: 12/21/2020 AP axillary outlet radiographs  right shoulder reviewed.  Moderate AC joint arthritis is present.  No significant glenohumeral joint arthritis is present.  Acromiohumeral distance maintained.  No acute fracture or dislocation.    PMFS History: Patient Active Problem List   Diagnosis Date Noted   Skin lesion 12/08/2020   Right-sided back pain 11/08/2020   Ganglion cyst of dorsum of right wrist 12/15/2018   Lichen sclerosus of female genitalia 09/25/2018    Class: Chronic   Hyperlipidemia 03/17/2018   Lichen planus 03/17/2018   Numbness and tingling of right leg  10/19/2016   Shoulder pain 03/01/2016   Headache 03/01/2016   Asymmetric SNHL (sensorineural hearing loss) 01/10/2016   Tinnitus of both ears 01/10/2016   Hearing loss 10/07/2015   Thyroid nodule 05/24/2015   Globus sensation 05/05/2015   Neck pain 01/07/2015   Allergic rhinitis 07/19/2014   GERD (gastroesophageal reflux disease) 07/19/2014   Pitting edema 07/19/2014   Swallowing difficulty 07/06/2013   Osteoarthritis of right knee 12/26/2012   GLAUCOMA 11/04/2009   DDD (degenerative disc disease), lumbar 05/10/2009   URGE INCONTINENCE 06/01/2008   Overweight (BMI 25.0-29.9) 05/30/2006   HYPERTENSION, BENIGN SYSTEMIC 05/30/2006   Osteoarthritis 05/30/2006   Past Medical History:  Diagnosis Date   Allergic rhinitis 07/19/2014   Arthritis of knee, right 03/11/2006   Bursitis of shoulder, right 09/19/1999   Cervical radiculopathy at C6 05/24/2011   Beginning Nov 2012. Multi-level foraminal narrowing in C-spine plain films    Diverticulosis    DIVERTICULOSIS OF COLON 05/30/2006   Colonoscopy 06/2014 - multiple diverticula, no other abnormalities, no further colonoscopies recommended given patient's age of 61     GERD (gastroesophageal reflux disease) 07/19/2014   Diagnosed clinically by [facility] in January of 2016.     Glaucoma    Hearing decreased 08/07/2011   With whooshing tinnitus Mild bilateral loss on 08/2012 audiology who will recheck her hearing in one year     History of herniated intervertebral disc    Hypertension    HYPERTENSION, BENIGN SYSTEMIC 05/30/2006        Left sided sciatica 8/95,11/00   CT confirmed    LOW BACK PAIN SYNDROME 05/10/2009   Qualifier: Diagnosis of  By: Lorenda Hatchet CMA,, Thekla  Mild C 3-4, 4-5 lumbar spinal stenosis MRI 08/2011, Diffuse lower thoracic and lumbar DDD    Mitral valve prolapse    per patient heart murmur   Orthostatic hypotension 06/12/2013   Osteoarthritis    Osteoarthritis of right knee 12/26/2012   Confirmed on xray 07/2014     OSTEOARTHRITIS, MULTI SITES 05/30/2006   She is stoic about pain and wants to avoid surgeries Past xrays have documented DJD in right knee and lumbar spine     Overweight (BMI 25.0-29.9) 05/30/2006   Pitting edema 07/19/2014   Bilateral 1+ pitting edema of calves first noted on 07/19/2014.     Sciatica 05/30/2006   Qualifier: Diagnosis of  By: Sheffield Slider MD, Wayne  Involved left leg in 02/1999    Swallowing difficulty 07/06/2013   Urge incontinence 06/01/2008   Qualifier: Diagnosis of  By: Sheffield Slider MD, Deniece Portela      Family History  Problem Relation Age of Onset   Breast cancer Sister        1996   Breast cancer Sister 53   Hypertension Father    Heart disease Father    Stroke Mother 81       cerebral hemorrhage   Hypertension Mother    Breast cancer Maternal Aunt  Past Surgical History:  Procedure Laterality Date   CATARACT EXTRACTION  02/2011   left eye   TUBAL LIGATION  1973   VULVA /PERINEUM BIOPSY  2010   for hypopigmentation, non-specific result   Social History   Occupational History   Occupation: retired    Associate Professor: [employer]    Comment: career development  Tobacco Use   Smoking status: Former    Packs/day: 0.50    Years: 15.00    Pack years: 7.50    Types: Cigarettes    Quit date: 04/02/1994    Years since quitting: 26.7   Smokeless tobacco: Never  Vaping Use   Vaping Use: Never used  Substance and Sexual Activity   Alcohol use: No   Drug use: No   Sexual activity: Not Currently    Partners: Male    Birth control/protection: Surgical    Comment: BTSP

## 2021-01-03 ENCOUNTER — Other Ambulatory Visit: Payer: Self-pay

## 2021-01-03 ENCOUNTER — Ambulatory Visit (INDEPENDENT_AMBULATORY_CARE_PROVIDER_SITE_OTHER): Payer: Medicare Other

## 2021-01-03 DIAGNOSIS — Z23 Encounter for immunization: Secondary | ICD-10-CM

## 2021-01-03 NOTE — Progress Notes (Signed)
Patient presents to nurse clinic for flu vaccination. Administered in LD, site unremarkable, tolerated injection well.   Kimberlyann Hollar C [place] Pickelsimer, RN   

## 2021-01-18 ENCOUNTER — Other Ambulatory Visit: Payer: Self-pay

## 2021-01-18 ENCOUNTER — Ambulatory Visit (INDEPENDENT_AMBULATORY_CARE_PROVIDER_SITE_OTHER): Payer: Medicare Other

## 2021-01-18 DIAGNOSIS — Z23 Encounter for immunization: Secondary | ICD-10-CM | POA: Diagnosis not present

## 2021-01-24 ENCOUNTER — Ambulatory Visit (INDEPENDENT_AMBULATORY_CARE_PROVIDER_SITE_OTHER): Payer: Medicare Other | Admitting: Family Medicine

## 2021-01-24 ENCOUNTER — Other Ambulatory Visit: Payer: Self-pay

## 2021-01-24 VITALS — BP 186/100 | HR 57 | Ht 64.0 in | Wt 174.4 lb

## 2021-01-24 DIAGNOSIS — I10 Essential (primary) hypertension: Secondary | ICD-10-CM

## 2021-01-24 DIAGNOSIS — E785 Hyperlipidemia, unspecified: Secondary | ICD-10-CM

## 2021-01-24 MED ORDER — LOSARTAN POTASSIUM 50 MG PO TABS: 50.0000 mg | ORAL_TABLET | Freq: Every day | ORAL | 1 refills | Status: DC

## 2021-01-24 NOTE — Patient Instructions (Signed)
Add losartan 50mg  daily  Return in 1 week for RN blood pressure check and lab visit to check kidneys  Be well, Dr. 

## 2021-01-25 ENCOUNTER — Telehealth: Payer: Self-pay

## 2021-01-25 NOTE — Telephone Encounter (Signed)
VOB submitted for Monovisc, right knee. Pending BV. 

## 2021-01-30 ENCOUNTER — Encounter: Payer: Self-pay | Admitting: Obstetrics and Gynecology

## 2021-01-30 ENCOUNTER — Ambulatory Visit (INDEPENDENT_AMBULATORY_CARE_PROVIDER_SITE_OTHER): Payer: Medicare Other | Admitting: Obstetrics and Gynecology

## 2021-01-30 ENCOUNTER — Ambulatory Visit: Payer: Medicare Other | Admitting: Obstetrics and Gynecology

## 2021-01-30 ENCOUNTER — Other Ambulatory Visit: Payer: Self-pay

## 2021-01-30 VITALS — BP 160/82 | HR 52 | Ht 62.5 in | Wt 175.0 lb

## 2021-01-30 DIAGNOSIS — L900 Lichen sclerosus et atrophicus: Secondary | ICD-10-CM

## 2021-01-30 DIAGNOSIS — Z780 Asymptomatic menopausal state: Secondary | ICD-10-CM | POA: Diagnosis not present

## 2021-01-30 DIAGNOSIS — Z01419 Encounter for gynecological examination (general) (routine) without abnormal findings: Secondary | ICD-10-CM | POA: Diagnosis not present

## 2021-01-30 MED ORDER — BETAMETHASONE VALERATE 0.1 % EX OINT: 1.0000 "application " | TOPICAL_OINTMENT | Freq: Two times a day (BID) | CUTANEOUS | 0 refills | Status: DC

## 2021-01-30 NOTE — Patient Instructions (Signed)

## 2021-01-30 NOTE — Progress Notes (Signed)
GYNECOLOGY  VISIT   HPI: 75 y.o.   Married  [place] American  female   G1P1 with [name] last menstrual period was 04/02/1996.   here for breast and pelvic exam. She is using a cane today.   Is also followed for lichen sclerosus.  She stopped using clobetasol due to a recall on the medication.  She experiences irritation once a month.   Her husband's sister died last week.   GYNECOLOGIC HISTORY: [name] last menstrual period was 04/02/1996. Contraception: Tubal Menopausal hormone therapy: none Last mammogram: 09-07-20 3D/Neg/BiRads2/benign--stable calcifications Lt.Breast/screening 59yrLast pap smear: 12-24-18 Neg:Neg HR HPV, 12-13-15 Neg Paps always normal.  BMD 2011 - normal.  OB History     Gravida  1   Para  1   Term      Preterm      AB      Living  1      SAB      IAB      Ectopic      Multiple      Live Births  1              Patient Active Problem List   Diagnosis Date Noted   Skin lesion 12/08/2020   Right-sided back pain 11/08/2020   Ganglion cyst of dorsum of right wrist 076/19/5093  Lichen sclerosus of female genitalia 09/25/2018    Class: Chronic   Hyperlipidemia 126/71/2458  Lichen planus 109/98/3382  Numbness and tingling of right leg 10/19/2016   Shoulder pain 03/01/2016   Headache 03/01/2016   Asymmetric SNHL (sensorineural hearing loss) 01/10/2016   Tinnitus of both ears 01/10/2016   Hearing loss 10/07/2015   Thyroid nodule 05/24/2015   Globus sensation 05/05/2015   Neck pain 01/07/2015   Allergic rhinitis 07/19/2014   GERD (gastroesophageal reflux disease) 07/19/2014   Pitting edema 07/19/2014   Swallowing difficulty 07/06/2013   Osteoarthritis of right knee 12/26/2012   GLAUCOMA 11/04/2009   DDD (degenerative disc disease), lumbar 05/10/2009   URGE INCONTINENCE 06/01/2008   Overweight (BMI 25.0-29.9) 05/30/2006   HYPERTENSION, BENIGN SYSTEMIC 05/30/2006   Osteoarthritis 05/30/2006    Past Medical History:   Diagnosis Date   Allergic rhinitis 07/19/2014   Arthritis of knee, right 03/11/2006   Bursitis of shoulder, right 09/19/1999   Cervical radiculopathy at C6 05/24/2011   Beginning Nov 2012. Multi-level foraminal narrowing in C-spine plain films    Diverticulosis    DIVERTICULOSIS OF COLON 05/30/2006   Colonoscopy 06/2014 - multiple diverticula, no other abnormalities, no further colonoscopies recommended given patient's age of 89    GERD (gastroesophageal reflux disease) 07/19/2014   [facility]     Glaucoma    Hearing decreased 08/07/2011   With whooshing tinnitus Mild bilateral loss on 08/2012 audiology who will recheck her hearing in one year     History of herniated intervertebral disc    Hypertension    HYPERTENSION, BENIGN SYSTEMIC 05/30/2006        Left sided sciatica 8/95,11/00   CT confirmed    LOW BACK PAIN SYNDROME 05/10/2009   Qualifier: Diagnosis of  By: SJavier GlazierCMA,, [place]  Mild C 3-4, 4-5 lumbar spinal stenosis MRI 08/2011, Diffuse lower thoracic and lumbar DDD    Mitral valve prolapse    per patient heart murmur   Orthostatic hypotension 06/12/2013   Osteoarthritis    Osteoarthritis of right knee 12/26/2012   Confirmed on xray 07/2014  OSTEOARTHRITIS, MULTI SITES 05/30/2006   She is stoic about pain and wants to avoid surgeries Past xrays have documented DJD in right knee and lumbar spine     Overweight (BMI 25.0-29.9) 05/30/2006   Pitting edema 07/19/2014   Bilateral 1+ pitting edema of calves first noted on 07/19/2014.     Sciatica 05/30/2006   Qualifier: Diagnosis of  By: Walker Kehr MD, Wayne  Involved left leg in 02/1999    Swallowing difficulty 07/06/2013   Urge incontinence 06/01/2008   Qualifier: Diagnosis of  By: Walker Kehr MD, Patrick Jupiter      Past Surgical History:  Procedure Laterality Date   CATARACT EXTRACTION  02/2011   left eye   [place] BIOPSY  2010   for hypopigmentation, non-specific result     Current Outpatient Medications  Medication Sig Dispense Refill   acetaminophen (TYLENOL) 650 MG CR tablet Take 2 tablets (1,300 mg total) by mouth every 8 (eight) hours as needed. (Patient taking differently: Take 1,300 mg by mouth 3 (three) times daily.) 90 tablet 1   amLODipine (NORVASC) 10 MG tablet TAKE 1 TABLET(10 MG) BY MOUTH AT BEDTIME 90 tablet 3   aspirin EC 81 MG tablet Take 81 mg by mouth daily.     brimonidine-timolol (COMBIGAN) 0.2-0.5 % ophthalmic solution Place 2 drops into both eyes every 12 (twelve) hours.     Cholecalciferol 25 MCG (1000 UT) capsule Take 2,000 Units by mouth daily.     gabapentin (NEURONTIN) 400 MG capsule TAKE 2 CAPSULES(800 MG) BY MOUTH THREE TIMES DAILY 540 capsule 1   latanoprost (XALATAN) 0.005 % ophthalmic solution PLACE 1 DROP INTO BOTH EYES EVERY EVENING/BEDTIME     losartan (COZAAR) 50 MG tablet Take 1 tablet (50 mg total) by mouth daily. 30 tablet 1   metoprolol succinate (TOPROL-XL) 100 MG 24 hr tablet TAKE 1 TABLET BY MOUTH DAILY WITH OR IMMEDIATELY FOLLOWING A MEAL 90 tablet 3   Omega-3 Fatty Acids (FISH OIL PO) Take by mouth.     No current facility-administered medications for this visit.     ALLERGIES: Sulfamethoxazole-trimethoprim and Hctz [hydrochlorothiazide]  Family History  Problem Relation Age of Onset   Breast cancer Sister        68   Breast cancer Sister 64   Hypertension Father    Heart disease Father    Stroke Mother 69       cerebral hemorrhage   Hypertension Mother    Breast cancer Maternal Aunt     Social History   Socioeconomic History   Marital status: Married    Spouse name: Jeneen Rinks   Number of children: 1   Years of education: 18   Highest education level: Master's degree (e.g., MA, MS, MEng, MEd, MSW, MBA)  Occupational History   Occupation: retired    Fish farm manager: [employer]    Comment: career development  Tobacco Use   Smoking status: Former    Packs/day: 0.50    Years: 15.00    Pack years:  7.50    Types: Cigarettes    Quit date: 04/02/1994    Years since quitting: 26.8   Smokeless tobacco: Never  Vaping Use   Vaping Use: Never used  Substance and Sexual Activity   Alcohol use: No   Drug use: No   Sexual activity: Not Currently    Partners: Male    Birth control/protection: Surgical    Comment: BTSP  Other Topics Concern   Not on file  Social  History Narrative   Patient lives with husband in [place].    Patient has one son and two grandchildren who live nearby.   Patient enjoys seeing her grandchildren grow up and watching their sporting events.    Patient enjoys reading and her book club, crossword puzzles, and shopping.   Husband in remission for multiple myeloma and doing well.          Social Determinants of Health   Financial Resource Strain: Low Risk    Difficulty of Paying Living Expenses: Not hard at all  Food Insecurity: No Food Insecurity   Worried About Charity fundraiser in the Last Year: Never true   [place] in the Last Year: Never true  Transportation Needs: No Transportation Needs   Lack of Transportation (Medical): No   Lack of Transportation (Non-Medical): No  Physical Activity: Insufficiently Active   Days of Exercise per Week: 1 day   Minutes of Exercise per Session: 20 min  Stress: No Stress Concern Present   Feeling of Stress : Not at all  Social Connections: Moderately Integrated   Frequency of Communication with Friends and Family: More than three times a week   Frequency of Social Gatherings with Friends and Family: More than three times a week   Attends Religious Services: Never   Marine scientist or Organizations: Yes   Attends Music therapist: More than 4 times per year   Marital Status: Married  Human resources officer Violence: Not At Risk   Fear of Current or Ex-Partner: No   Emotionally Abused: No   Physically Abused: No   Sexually Abused: No    Review of Systems  All other systems reviewed and  are negative.  PHYSICAL EXAMINATION:    BP (!) 160/82   Pulse (!) 52   Ht 5' 2.5" (1.588 m)   Wt 175 lb (79.4 kg)   LMP 04/02/1996   SpO2 97%   BMI 31.50 kg/m     General appearance: alert, cooperative and appears stated age Head: Normocephalic, without obvious abnormality, atraumatic Neck: no adenopathy, supple, symmetrical, trachea midline and thyroid normal to inspection and palpation Lungs: clear to auscultation bilaterally Breasts: normal appearance, no masses or tenderness, No nipple retraction or dimpling, No nipple discharge or bleeding, No axillary or supraclavicular adenopathy Heart: regular rate and rhythm Abdomen: soft, non-tender, no masses,  no organomegaly Extremities: extremities normal, atraumatic, no cyanosis or edema Skin: Skin color, texture, turgor normal. No rashes or lesions Lymph nodes: Cervical, supraclavicular, and axillary nodes normal. No abnormal inguinal nodes palpated Neurologic: Grossly normal  Pelvic: External genitalia:  hypo and hyperpigmentation of vulva.              Urethra:  normal appearing urethra with no masses, tenderness or lesions              Bartholins and Skenes: normal                 Vagina: normal appearing vagina with normal color and discharge, no lesions              Cervix: no lesions                Bimanual Exam:  Uterus:  normal size, contour, position, consistency, mobility, non-tender              Adnexa: no mass, fullness, tenderness              Rectal exam: yes.[name]  Confirms.              Anus:  normal sphincter tone, no lesions  Chaperone was present for exam:  Estill Bamberg, CMA.  ASSESSMENT  Well woman with GYN examination.  Hypopigmentation of the vulva.  Prior dx of lichen sclerosus.  FH breast cancer - two sisters, one paternal aunt.  Hx elevated calcium level.  Normal PTH.  Menopause. Bereavement.   PLAN  She declines a pap today.  I prescribed Valisone ointment 0.1% to use on the vulva bid x 2 weeks prn a  flare of lichen sclerosus.  Use twice weekly at hs for maintenance dosing.  Yearly mammogram. BMD ordered at the [facility].  Patient will call to schedule this.  Support given for the loss of her family member. Fu in 1 year and prn.   An After Visit Summary was printed and given to the patient.  25 min  total time was spent for this patient encounter, including preparation, face-to-face counseling with the patient, coordination of care, and documentation of the encounter.

## 2021-01-31 ENCOUNTER — Ambulatory Visit: Payer: 59

## 2021-01-31 ENCOUNTER — Other Ambulatory Visit: Payer: 59

## 2021-01-31 ENCOUNTER — Telehealth: Payer: Self-pay

## 2021-01-31 NOTE — Telephone Encounter (Signed)
Approved for Monov[employer], right knee. [place] deductible has been met Secondary insurance Psychologist, counselling) will pick up remaining eligible expenses at 100%. No Co-pay No PA required  Appt. 02/15/2021 with Lurena Joiner

## 2021-02-01 ENCOUNTER — Ambulatory Visit (INDEPENDENT_AMBULATORY_CARE_PROVIDER_SITE_OTHER): Payer: Medicare Other

## 2021-02-01 ENCOUNTER — Other Ambulatory Visit: Payer: 59

## 2021-02-01 ENCOUNTER — Other Ambulatory Visit: Payer: Self-pay

## 2021-02-01 VITALS — BP 125/76 | HR 49

## 2021-02-01 DIAGNOSIS — I10 Essential (primary) hypertension: Secondary | ICD-10-CM | POA: Diagnosis not present

## 2021-02-01 DIAGNOSIS — Z0130 Encounter for examination of blood pressure without abnormal findings: Secondary | ICD-10-CM

## 2021-02-01 DIAGNOSIS — E785 Hyperlipidemia, unspecified: Secondary | ICD-10-CM | POA: Diagnosis not present

## 2021-02-01 MED ORDER — METOPROLOL SUCCINATE ER 50 MG PO TB24: 50.0000 mg | ORAL_TABLET | Freq: Every day | ORAL | 3 refills | Status: DC

## 2021-02-01 NOTE — Assessment & Plan Note (Signed)
Plan as of office visit 01/24/21: - blood pressure uncontrolled - add losartan 50mg  daily (since 25mg  daily was not controlling pressures at home) - follow up in 1 week for RN blood pressure check & BMET to assess renal function/potassium

## 2021-02-01 NOTE — Progress Notes (Signed)
Patient here today for BP check.      Last BP was on 01/24/2021 and was 186/100.  BP today is 125/76 with a pulse of 49. HR was checked radially and was 46.    Checked BP in left arm with regular adult cuff.    Symptoms present: none.   Patient has been taking BP medications as prescribed. Spoke with Dr. Pollie Meyer regarding low HR. Advised that patient decrease metoprolol to 50 mg. Rx sent to pharmacy by Dr. Pollie Meyer. Scheduled patient follow up nurse visit for next week.  Provided patient with return precautions.   Veronda Prude, RN

## 2021-02-01 NOTE — Progress Notes (Signed)
  Date of Visit: 01/24/2021   SUBJECTIVE:   HPI:  Laura Griffin presents today for follow up of hypertension.  Was seen at urgent care on 9/12 for elevated blood pressures at home. At that visit losartan 25mg  daily was prescribed, in addition to her metoprolol XL 100mg  daily and amlodipine 10mg  daily. Blood pressures remained elevated at home despite this additional medication. She has been out of the losartan for a few days. Overall feels well. No headache today.  OBJECTIVE:   BP (!) 186/100   Pulse (!) 57   Ht 5\' 4"  (1.626 m)   Wt 174 lb 6.4 oz (79.1 kg)   LMP 04/02/1996   SpO2 100%   BMI 29.94 kg/m  Gen: no acute distress, pleasant, cooperative Heart: regular rate and rhythm, no murmur Lungs: clear to auscultation bilaterally, normal work of breathing  Neuro: alert, speech normal, grossly nonfocal Ext: No appreciable lower extremity edema bilaterally   ASSESSMENT/PLAN:   HYPERTENSION, BENIGN SYSTEMIC Plan as of office visit 01/24/21: - blood pressure uncontrolled - add losartan 50mg  daily (since 25mg  daily was not controlling pressures at home) - follow up in 1 week for RN blood pressure check & BMET to assess renal function/potassium   FOLLOW UP: Follow up in 1 week for RN blood pressure check and lab visit  J. , MD [facility]

## 2021-02-02 LAB — LIPID PANEL
Chol/HDL Ratio: 3.5 ratio (ref 0.0–4.4)
Cholesterol, Total: 163 mg/dL (ref 100–199)
HDL: 46 mg/dL (ref >39)
LDL Chol Calc (NIH): 94 mg/dL (ref 0–99)
Triglycerides: 131 mg/dL (ref 0–149)
VLDL Cholesterol Cal: 23 mg/dL (ref 5–40)

## 2021-02-02 LAB — BASIC METABOLIC PANEL
BUN/Creatinine Ratio: 11 — ABNORMAL LOW (ref 12–28)
BUN: 10 mg/dL (ref 8–27)
CO2: 25 mmol/L (ref 20–29)
Calcium: 10.4 mg/dL — ABNORMAL HIGH (ref 8.7–10.3)
Chloride: 103 mmol/L (ref 96–106)
Creatinine, Ser: 0.93 mg/dL (ref 0.57–1.00)
Glucose: 90 mg/dL (ref 70–99)
Potassium: 3.7 mmol/L (ref 3.5–5.2)
Sodium: 141 mmol/L (ref 134–144)
eGFR: 63 mL/min/{1.73_m2} (ref >59)

## 2021-02-08 ENCOUNTER — Ambulatory Visit: Payer: 59

## 2021-02-15 ENCOUNTER — Encounter: Payer: Self-pay | Admitting: Surgical

## 2021-02-15 ENCOUNTER — Other Ambulatory Visit: Payer: Self-pay

## 2021-02-15 ENCOUNTER — Ambulatory Visit (INDEPENDENT_AMBULATORY_CARE_PROVIDER_SITE_OTHER): Payer: Medicare Other | Admitting: Surgical

## 2021-02-15 ENCOUNTER — Ambulatory Visit: Payer: Medicare Other | Admitting: Surgical

## 2021-02-15 DIAGNOSIS — M1711 Unilateral primary osteoarthritis, right knee: Secondary | ICD-10-CM | POA: Diagnosis not present

## 2021-02-15 MED ORDER — HYALURONAN 88 MG/4ML IX SOSY
88.0000 mg | PREFILLED_SYRINGE | INTRA_ARTICULAR | Status: AC | PRN
  Administered 2021-02-15: 88 mg via INTRA_ARTICULAR

## 2021-02-15 MED ORDER — LIDOCAINE HCL 1 % IJ SOLN
5.0000 mL | INTRAMUSCULAR | Status: AC | PRN
  Administered 2021-02-15: 5 mL

## 2021-02-15 NOTE — Progress Notes (Signed)
   Procedure Note  Patient: Laura Griffin             Date of Birth: 11-17-1945           MRN: [id number]             Visit Date: 02/15/2021  Procedures: Visit Diagnoses:  1. Arthritis of right knee     Large Joint Inj: R knee on 02/15/2021 11:23 AM Indications: pain, joint swelling and diagnostic evaluation Details: 18 G 1.5 in needle, superolateral approach  Arthrogram: No  Medications: 5 mL lidocaine 1 %; 88 mg Hyaluronan 88 MG/4ML Outcome: tolerated well, no immediate complications Procedure, treatment alternatives, risks and benefits explained, specific risks discussed. Consent was given by the patient. Immediately prior to procedure a time out was called to verify the correct patient, procedure, equipment, support staff and site/side marked as required. Patient was prepped and draped in the usual sterile fashion.

## 2021-02-19 ENCOUNTER — Other Ambulatory Visit: Payer: Self-pay | Admitting: Family Medicine

## 2021-02-22 ENCOUNTER — Encounter: Payer: Self-pay | Admitting: Family Medicine

## 2021-03-11 ENCOUNTER — Other Ambulatory Visit: Payer: Self-pay | Admitting: Obstetrics and Gynecology

## 2021-03-12 DIAGNOSIS — M5440 Lumbago with sciatica, unspecified side: Secondary | ICD-10-CM | POA: Diagnosis not present

## 2021-03-14 ENCOUNTER — Ambulatory Visit: Payer: 59 | Admitting: Internal Medicine

## 2021-03-14 NOTE — Progress Notes (Deleted)
Patient ID: Laura Griffin, female   DOB: 18-Feb-1946, 75 y.o.   MRN: [id number]   This visit occurred during the SARS-CoV-2 public health emergency.  Safety protocols were in place, including screening questions prior to the visit, additional usage of staff PPE, and extensive cleaning of exam room while observing appropriate contact time as indicated for disinfecting solutions.   HPI  Laura Griffin is a 75 y.o.-year-old female, initially referred by her PCP, Dr. Ardelia Mems, returning for follow-up for hypercalcemia/hyperparathyroidism and thyroid nodule.  Last visit 1 year ago.  Reviewed history: Elevated calcium  - seen ~2005 when she was on calcium supplements.  She was advised to stop the supplements and calcium normalized  - however, in 2019, her calcium started to return elevated again.   - in 05/2019, we stopped multivitamins with calcium.  Subsequent PTH and calcium levels were normal. - in 01/2021, calcium was high again  I reviewed her pertinent labs: Component     Latest Ref Rng & Units 03/22/2020  Calcium Ionized     4.8 - 5.6 mg/dL 5.5   Lab Results  Component Value Date   PTH 40 09/23/2019   PTH 29 06/10/2019   PTH 41 01/13/2019   PTH Comment 01/13/2019   PTH 44.3 04/25/2011   PTH 23.8 08/28/2007   CALCIUM 10.4 (H) 02/01/2021   CALCIUM 9.9 12/12/2020   CALCIUM 10.4 09/23/2019   CALCIUM 10.5 06/15/2019   CALCIUM 10.6 (H) 01/13/2019   CALCIUM 10.9 (H) 12/30/2018   CALCIUM 11.4 (H) 12/22/2018   CALCIUM 11.2 (H) 12/15/2018   CALCIUM 10.3 09/02/2017   CALCIUM 10.5 (H) 07/02/2017   12/15/2018: Corrected calcium 10.48 (ULN 10.3).  No history of osteoporosis.  Reviewed DXA scan report from 2011: T-scores were normal.  No fractures or falls.  No history of kidney stones.  24-hour urine calcium (02/08/2019) was normal, with a calcium of 91 mg/24 h, however, urine creatinine level was not checked at that time so it was unclear whether this was an appropriate  collection or not.  We checked this again but unfortunately a BMP was not drawn so we could not calculate the fractional excretion of calcium: Component     Latest Ref Rng & Units 06/12/2019  Creatinine, 24H Ur     0.50 - 2.15 g/24 h 0.77  Calcium, 24H Urine     mg/24 h 66   After stopping her calcium supplement (multivitamin), her calcium, PTH, calcitriol levels were normal, as were her phosphorus and magnesium Component     Latest Ref Rng & Units 06/10/2019  Vitamin D 1, 25 (OH) Total     18 - 72 pg/mL 30  Vitamin D3 1, 25 (OH)     pg/mL 30  Vitamin D2 1, 25 (OH)     pg/mL <8  PTH, Intact     15 - 65 pg/mL 29  Phosphorus     2.3 - 4.6 mg/dL 3.1  Magnesium     1.5 - 2.5 mg/dL 1.8   Component     Latest Ref Rng & Units 06/15/2019  Calcium     8.4 - 10.5 mg/dL 10.5   + Mild CKD.  Latest BUN/Cr: Lab Results  Component Value Date   BUN 10 02/01/2021   BUN 10 12/12/2020   CREATININE 0.93 02/01/2021   CREATININE 0.93 12/12/2020   Previously on HCTZ, stopped in 08-12/2018.  No history of vitamin D deficiency: Lab Results  Component Value Date   VD25OH 37.4 01/28/2020  VD25OH 30.8 09/23/2019   VD25OH 31.79 05/26/2019   She was previously on calcium (multivitamins - 300 mg) + 1000 units vitamin D daily.  We stopped her calcium and I advised her to increase her vitamin D daily dose to 2000 units daily.  She is now on this dose.  She denies a FH of hypercalcemia, pituitary tumors, thyroid cancer, or osteoporosis.   Pt. also has a history of HTN, OA, GERD (not on PPIs), sciatica.  Her mother died of cerebral hemorrhage in her late 01U as a complication of uncontrolled hypertension.  Thyroid nodule: -Subcentimeter -Without worrisome features  Reviewed her thyroid ultrasound report from 2017: Right thyroid lobe : 2.6 cm x 0.8 cm x 1.7 cm. Heterogeneous appearance of the right thyroid tissue without significantly increased flow.   Left thyroid lobe : 3.6 cm x 1.2 cm x  1.3 cm. Heterogeneous appearance of left-sided thyroid tissue without significantly increased flow. Nodule at the inferior left thyroid measures 8 mm x 6 mm x 7 mm with no internal calcifications.  Isthmus Thickness: 5 mm.  No nodules visualized.  Lymphadenopathy: None visualized.   IMPRESSION: Heterogeneous thyroid with single left-sided nodule.   Findings do not meet current SRU consensus criteria for biopsy. Follow-up by clinical exam is recommended. In a patient of this age with no risk factors for thyroid carcinoma, lengthening the surveillance interval beyond 12 months is a reasonable strategy, as thyroid cancers <2cm are known to be indolent with nearly 100% 10-year survival. If the patient has known risk factors for thyroid carcinoma, a follow-up ultrasound in 12 months is recommended.  Pt denies: - feeling nodules in neck - hoarseness - dysphagia - choking - SOB with lying down  No family history of thyroid cancer.  No history of radiation to head or neck.  Latest TSH: Lab Results  Component Value Date   TSH 1.46 03/22/2020   TSH 1.87 05/26/2019   TSH 1.638 05/05/2015   TSH 0.807 07/06/2013   TSH 1.196 10/03/2010   TSH 1.116 11/11/2007   ROS: + see HPI Musculoskeletal: + muscle aches/no joint aches  I reviewed pt's medications, allergies, PMH, social hx, family hx, and changes were documented in the history of present illness. Otherwise, unchanged from my initial visit note.  Past Medical History:  Diagnosis Date   Allergic rhinitis 07/19/2014   Arthritis of knee, right 03/11/2006   Bursitis of shoulder, right 09/19/1999   Cervical radiculopathy at C6 05/24/2011   Beginning Nov 2012. Multi-level foraminal narrowing in C-spine plain films    Diverticulosis    DIVERTICULOSIS OF COLON 05/30/2006   Colonoscopy 06/2014 - multiple diverticula, no other abnormalities, no further colonoscopies recommended given patient's age of 1     GERD (gastroesophageal reflux  disease) 07/19/2014   Diagnosed clinically by [facility] in January of 2016.     Glaucoma    Hearing decreased 08/07/2011   With whooshing tinnitus Mild bilateral loss on 08/2012 audiology who will recheck her hearing in one year     History of herniated intervertebral disc    Hypertension    HYPERTENSION, BENIGN SYSTEMIC 05/30/2006        Left sided sciatica 8/95,11/00   CT confirmed    LOW BACK PAIN SYNDROME 05/10/2009   Qualifier: Diagnosis of  By: Javier Glazier CMA,, [place]  Mild C 3-4, 4-5 lumbar spinal stenosis MRI 08/2011, Diffuse lower thoracic and lumbar DDD    Mitral valve prolapse    per patient heart murmur   Orthostatic  hypotension 06/12/2013   Osteoarthritis    Osteoarthritis of right knee 12/26/2012   Confirmed on xray 07/2014    OSTEOARTHRITIS, MULTI SITES 05/30/2006   She is stoic about pain and wants to avoid surgeries Past xrays have documented DJD in right knee and lumbar spine     Overweight (BMI 25.0-29.9) 05/30/2006   Pitting edema 07/19/2014   Bilateral 1+ pitting edema of calves first noted on 07/19/2014.     Sciatica 05/30/2006   Qualifier: Diagnosis of  By: Walker Kehr MD, [place]  Involved left leg in 02/1999    Swallowing difficulty 07/06/2013   Urge incontinence 06/01/2008   Qualifier: Diagnosis of  By: Walker Kehr MD, Patrick Jupiter     Past Surgical History:  Procedure Laterality Date   CATARACT EXTRACTION  02/2011   left eye   [place] BIOPSY  2010   for hypopigmentation, non-specific result   Social History   Socioeconomic History   Marital status: Married    Spouse name: Jeneen Rinks   Number of children: 1   Years of education: 18   Highest education level: Master's degree (e.g., MA, MS, MEng, MEd, MSW, MBA)  Occupational History   Occupation: retired    Fish farm manager: [employer]    Comment: career development  Tobacco Use   Smoking status: Former    Packs/day: 0.50    Years: 15.00    Pack years: 7.50    Types: Cigarettes    Quit date:  04/02/1994    Years since quitting: 26.9   Smokeless tobacco: Never  Vaping Use   Vaping Use: Never used  Substance and Sexual Activity   Alcohol use: No   Drug use: No   Sexual activity: Not Currently    Partners: Male    Birth control/protection: Surgical    Comment: BTSP  Other Topics Concern   Not on file  Social History Narrative   Patient lives with husband in [place].    Patient has one son and two grandchildren who live nearby.   Patient enjoys seeing her grandchildren grow up and watching their sporting events.    Patient enjoys reading and her book club, crossword puzzles, and shopping.   Husband in remission for multiple myeloma and doing well.          Social Determinants of Health   Financial Resource Strain: Not on file  Food Insecurity: Not on file  Transportation Needs: Not on file  Physical Activity: Not on file  Stress: Not on file  Social Connections: Not on file  Intimate Partner Violence: Not on file   Current Outpatient Medications on File Prior to Visit  Medication Sig Dispense Refill   acetaminophen (TYLENOL) 650 MG CR tablet Take 2 tablets (1,300 mg total) by mouth every 8 (eight) hours as needed. (Patient taking differently: Take 1,300 mg by mouth 3 (three) times daily.) 90 tablet 1   amLODipine (NORVASC) 10 MG tablet TAKE 1 TABLET(10 MG) BY MOUTH AT BEDTIME 90 tablet 3   aspirin EC 81 MG tablet Take 81 mg by mouth daily.     betamethasone valerate ointment (VALISONE) 0.1 % Apply 1 application topically 2 (two) times daily. Use for 2 weeks at a time as needed. Use twice a week at bed time for maintenance dosing. 45 g 0   brimonidine-timolol (COMBIGAN) 0.2-0.5 % ophthalmic solution Place 2 drops into both eyes every 12 (twelve) hours.     Cholecalciferol 25 MCG (1000 UT) capsule Take 2,000 Units by  mouth daily.     gabapentin (NEURONTIN) 400 MG capsule TAKE 2 CAPSULES(800 MG) BY MOUTH THREE TIMES DAILY 540 capsule 1   latanoprost (XALATAN) 0.005 %  ophthalmic solution PLACE 1 DROP INTO BOTH EYES EVERY EVENING/BEDTIME     losartan (COZAAR) 50 MG tablet Take 1 tablet (50 mg total) by mouth daily. 30 tablet 1   metoprolol succinate (TOPROL-XL) 50 MG 24 hr tablet Take 1 tablet (50 mg total) by mouth at bedtime. Take with or immediately following a meal. 90 tablet 3   Omega-3 Fatty Acids (FISH OIL PO) Take by mouth.     No current facility-administered medications on file prior to visit.   Allergies  Allergen Reactions   Sulfamethoxazole-Trimethoprim     REACTION: rash: nausea, swelling   Hctz [Hydrochlorothiazide] Other (See Comments)    Elevated calcium   Family History  Problem Relation Age of Onset   Breast cancer Sister        83   Breast cancer Sister 83   Hypertension Father    Heart disease Father    Stroke Mother 69       cerebral hemorrhage   Hypertension Mother    Breast cancer Maternal Aunt     PE: LMP 04/02/1996  Wt Readings from Last 3 Encounters:  01/30/21 175 lb (79.4 kg)  01/24/21 174 lb 6.4 oz (79.1 kg)  12/08/20 176 lb 9.6 oz (80.1 kg)   Constitutional: overweight, in NAD Eyes: PERRLA, EOMI, no exophthalmos ENT: moist mucous membranes, no thyromegaly, no cervical lymphadenopathy Cardiovascular: RRR, No MRG Respiratory: CTA B Musculoskeletal: no deformities, strength intact in all 4 Skin: moist, warm, no rashes Neurological: no tremor with outstretched hands, DTR normal in all 4  Assessment: 1. Hypercalcemia/hyperparathyroidism  2.  Thyroid nodule  Plan: Patient with several instances of elevated calcium, with the highest being 11.4.  An intact PTH level was not suppressed, at 41, for a calcium of 10.6. -She has no known history of vitamin D deficiency.  At last check, her vitamin D level was normal: Lab Results  Component Value Date   VD25OH 37.4 01/28/2020  -She has no apparent complications from hypercalcemia: No history of nephrolithiasis, no osteoporosis, no fractures, abdominal pain,  depression, bone pain. -At previous visit, we stopped her HCTZ and multivitamin.  We did discuss about the possibility of primary hyperparathyroidism, versus familial hypocalciuric hypercalcemia, renal insufficiency, etc.  Her calcium levels are fluctuating in the past, and urine calcium was not elevated.  In fact, it was close to the lower limit of normal.  I could not calculate a fractional excretion of calcium as a creatinine level was not obtained at the time of the urine collection, however, my suspicion for Tril[place] was low, since she had numerous instances with normal calcium in the past and also, her magnesium level was not elevated. -She recently had a high calcium again, at 10.4 on 02/01/2021.  At today's visit, I do plan to recheck an ionized calcium.  Of note, this was normal at last visit. -We reviewed possible consequences of hyperparathyroidism: osteoporosis, nephrolithiasis, however, new studies showed that mild hyperparathyroidism, may be stable for many years and without significant complications. -For now, we decided to continue to follow her calcium without intervention.   -She did meet criteria for surgery: Increased calcium by 1 mg/dL above the upper limit of normal Kidney ds.  Osteoporosis (or vertebral fracture) Age <30 years old Newer criteria (2013): High UCa >400 mg/d and increased stone risk by biochemical  stone risk analysis Presence of nephrolithiasis or nephrocalcinosis Pt's preference!  -I will see her back in 1 year   2.  Thyroid nodule -No neck compression symptoms -Per review of her thyroid ultrasound report from 2017, she had a small thyroid nodule, measuring 8 mm in the largest dimension -No follow-up was needed for such a small, low risk nodule -She has no risk factors for thyroid cancer -The nodule is not palpated today -No follow-up is needed unless the nodule appears to be growing or she started having neck compression symptoms -Latest TSH level was normal in  03/2020  Philemon Kingdom, MD PhD [facility] Endocrinology

## 2021-03-21 ENCOUNTER — Ambulatory Visit: Payer: 59 | Admitting: Internal Medicine

## 2021-03-21 NOTE — Progress Notes (Deleted)
Patient ID: Laura Griffin, female   DOB: [birth year], 75 y.o.   MRN: [id number]   This visit occurred during the SARS-CoV-2 public health emergency.  Safety protocols were in place, including screening questions prior to the visit, additional usage of staff PPE, and extensive cleaning of exam room while observing appropriate contact time as indicated for disinfecting solutions.   HPI  Laura Griffin is a 75 y.o.-year-old female, initially referred by her PCP, Dr. Ardelia Mems, returning for follow-up for hypercalcemia/hyperparathyroidism and thyroid nodule.  Last visit 1 year ago.  Reviewed history: Elevated calcium  - seen ~2005 when she was on calcium supplements.  She was advised to stop the supplements and calcium normalized  - however, in 2019, her calcium started to return elevated again.   - in 05/2019, we stopped multivitamins with calcium.  Subsequent PTH and calcium levels were normal. - in 01/2021, calcium was high again  I reviewed her pertinent labs: Component     Latest Ref Rng & Units 03/22/2020  Calcium Ionized     4.8 - 5.6 mg/dL 5.5   Lab Results  Component Value Date   PTH 40 09/23/2019   PTH 29 06/10/2019   PTH 41 01/13/2019   PTH Comment 01/13/2019   PTH 44.3 04/25/2011   PTH 23.8 08/28/2007   CALCIUM 10.4 (H) 02/01/2021   CALCIUM 9.9 12/12/2020   CALCIUM 10.4 09/23/2019   CALCIUM 10.5 06/15/2019   CALCIUM 10.6 (H) 01/13/2019   CALCIUM 10.9 (H) 12/30/2018   CALCIUM 11.4 (H) 12/22/2018   CALCIUM 11.2 (H) 12/15/2018   CALCIUM 10.3 09/02/2017   CALCIUM 10.5 (H) 07/02/2017   12/15/2018: Corrected calcium 10.48 (ULN 10.3).  No history of osteoporosis.  Reviewed DXA scan report from 2011: T-scores were normal.  No fractures or falls.  No history of kidney stones.  24-hour urine calcium (02/08/2019) was normal, with a calcium of 91 mg/24 h, however, urine creatinine level was not checked at that time so it was unclear whether this was an appropriate  collection or not.  We checked this again but unfortunately a BMP was not drawn so we could not calculate the fractional excretion of calcium: Component     Latest Ref Rng & Units 06/12/2019  Creatinine, 24H Ur     0.50 - 2.15 g/24 h 0.77  Calcium, 24H Urine     mg/24 h 66   After stopping her calcium supplement (multivitamin), her calcium, PTH, calcitriol levels were normal, as were her phosphorus and magnesium Component     Latest Ref Rng & Units 06/10/2019  Vitamin D 1, 25 (OH) Total     18 - 72 pg/mL 30  Vitamin D3 1, 25 (OH)     pg/mL 30  Vitamin D2 1, 25 (OH)     pg/mL <8  PTH, Intact     15 - 65 pg/mL 29  Phosphorus     2.3 - 4.6 mg/dL 3.1  Magnesium     1.5 - 2.5 mg/dL 1.8   Component     Latest Ref Rng & Units 06/15/2019  Calcium     8.4 - 10.5 mg/dL 10.5   + Mild CKD.  Latest BUN/Cr: Lab Results  Component Value Date   BUN 10 02/01/2021   BUN 10 12/12/2020   CREATININE 0.93 02/01/2021   CREATININE 0.93 12/12/2020   Previously on HCTZ, stopped in 08-12/2018.  No history of vitamin D deficiency: Lab Results  Component Value Date   VD25OH 37.4 01/28/2020  VD25OH 30.8 09/23/2019   VD25OH 31.79 05/26/2019   She was previously on calcium (multivitamins - 300 mg) + 1000 units vitamin D daily.  We stopped her calcium and I advised her to increase her vitamin D daily dose to 2000 units daily.  She is now on this dose.  She denies a FH of hypercalcemia, pituitary tumors, thyroid cancer, or osteoporosis.   Pt. also has a history of HTN, OA, GERD (not on PPIs), sciatica.  Her mother died of cerebral hemorrhage in her late 24P as a complication of uncontrolled hypertension.  Thyroid nodule: -Subcentimeter -Without worrisome features  Reviewed her thyroid ultrasound report from 2017: Right thyroid lobe : 2.6 cm x 0.8 cm x 1.7 cm. Heterogeneous appearance of the right thyroid tissue without significantly increased flow.   Left thyroid lobe : 3.6 cm x 1.2 cm x  1.3 cm. Heterogeneous appearance of left-sided thyroid tissue without significantly increased flow. Nodule at the inferior left thyroid measures 8 mm x 6 mm x 7 mm with no internal calcifications.  Isthmus Thickness: 5 mm.  No nodules visualized.  Lymphadenopathy: None visualized.   IMPRESSION: Heterogeneous thyroid with single left-sided nodule.   Findings do not meet current SRU consensus criteria for biopsy. Follow-up by clinical exam is recommended. In a patient of this age with no risk factors for thyroid carcinoma, lengthening the surveillance interval beyond 12 months is a reasonable strategy, as thyroid cancers <2cm are known to be indolent with nearly 100% 10-year survival. If the patient has known risk factors for thyroid carcinoma, a follow-up ultrasound in 12 months is recommended.  Pt denies: - feeling nodules in neck - hoarseness - dysphagia - choking - SOB with lying down  No family history of thyroid cancer.  No history of radiation to head or neck.  Latest TSH: Lab Results  Component Value Date   TSH 1.46 03/22/2020   TSH 1.87 05/26/2019   TSH 1.638 05/05/2015   TSH 0.807 07/06/2013   TSH 1.196 10/03/2010   TSH 1.116 11/11/2007   ROS: + see HPI Musculoskeletal: + muscle aches/no joint aches  I reviewed pt's medications, allergies, PMH, social hx, family hx, and changes were documented in the history of present illness. Otherwise, unchanged from my initial visit note.  Past Medical History:  Diagnosis Date   Allergic rhinitis 07/19/2014   Arthritis of knee, right 03/11/2006   Bursitis of shoulder, right 09/19/1999   Cervical radiculopathy at C6 05/24/2011   Beginning Nov 2012. Multi-level foraminal narrowing in C-spine plain films    Diverticulosis    DIVERTICULOSIS OF COLON 05/30/2006   Colonoscopy 06/2014 - multiple diverticula, no other abnormalities, no further colonoscopies recommended given patient's age of 75     GERD (gastroesophageal reflux  disease) 07/19/2014   Diagnosed clinically by [facility] in January of 2016.     Glaucoma    Hearing decreased 08/07/2011   With whooshing tinnitus Mild bilateral loss on 08/2012 audiology who will recheck her hearing in one year     History of herniated intervertebral disc    Hypertension    HYPERTENSION, BENIGN SYSTEMIC 05/30/2006        Left sided sciatica 8/95,11/00   CT confirmed    LOW BACK PAIN SYNDROME 05/10/2009   Qualifier: Diagnosis of  By: Javier Glazier CMA,, [place]  Mild C 3-4, 4-5 lumbar spinal stenosis MRI 08/2011, Diffuse lower thoracic and lumbar DDD    Mitral valve prolapse    per patient heart murmur   Orthostatic  hypotension 06/12/2013   Osteoarthritis    Osteoarthritis of right knee 12/26/2012   Confirmed on xray 07/2014    OSTEOARTHRITIS, MULTI SITES 05/30/2006   She is stoic about pain and wants to avoid surgeries Past xrays have documented DJD in right knee and lumbar spine     Overweight (BMI 25.0-29.9) 05/30/2006   Pitting edema 07/19/2014   Bilateral 1+ pitting edema of calves first noted on 07/19/2014.     Sciatica 05/30/2006   Qualifier: Diagnosis of  By: Walker Kehr MD, [place]  Involved left leg in 02/1999    Swallowing difficulty 07/06/2013   Urge incontinence 06/01/2008   Qualifier: Diagnosis of  By: Walker Kehr MD, Patrick [place]     Past Surgical History:  Procedure Laterality Date   CATARACT EXTRACTION  02/2011   left eye   [place] BIOPSY  2010   for hypopigmentation, non-specific result   Social History   Socioeconomic History   Marital status: Married    Spouse name: Jeneen Rinks   Number of children: 1   Years of education: 18   Highest education level: Master's degree (e.g., MA, MS, MEng, MEd, MSW, MBA)  Occupational History   Occupation: retired    Fish farm manager: [employer]    Comment: career development  Tobacco Use   Smoking status: Former    Packs/day: 0.50    Years: 15.00    Pack years: 7.50    Types: Cigarettes    Quit date:  04/02/1994    Years since quitting: 26.9   Smokeless tobacco: Never  Vaping Use   Vaping Use: Never used  Substance and Sexual Activity   Alcohol use: No   Drug use: No   Sexual activity: Not Currently    Partners: Male    Birth control/protection: Surgical    Comment: BTSP  Other Topics Concern   Not on file  Social History Narrative   Patient lives with husband in [place].    Patient has one son and two grandchildren who live nearby.   Patient enjoys seeing her grandchildren grow up and watching their sporting events.    Patient enjoys reading and her book club, crossword puzzles, and shopping.   Husband in remission for multiple myeloma and doing well.          Social Determinants of Health   Financial Resource Strain: Not on file  Food Insecurity: Not on file  Transportation Needs: Not on file  Physical Activity: Not on file  Stress: Not on file  Social Connections: Not on file  Intimate Partner Violence: Not on file   Current Outpatient Medications on File Prior to Visit  Medication Sig Dispense Refill   acetaminophen (TYLENOL) 650 MG CR tablet Take 2 tablets (1,300 mg total) by mouth every 8 (eight) hours as needed. (Patient taking differently: Take 1,300 mg by mouth 3 (three) times daily.) 90 tablet 1   amLODipine (NORVASC) 10 MG tablet TAKE 1 TABLET(10 MG) BY MOUTH AT BEDTIME 90 tablet 3   aspirin EC 81 MG tablet Take 81 mg by mouth daily.     betamethasone valerate ointment (VALISONE) 0.1 % Apply 1 application topically 2 (two) times daily. Use for 2 weeks at a time as needed. Use twice a week at bed time for maintenance dosing. 45 g 0   brimonidine-timolol (COMBIGAN) 0.2-0.5 % ophthalmic solution Place 2 drops into both eyes every 12 (twelve) hours.     Cholecalciferol 25 MCG (1000 UT) capsule Take 2,000 Units by  mouth daily.     gabapentin (NEURONTIN) 400 MG capsule TAKE 2 CAPSULES(800 MG) BY MOUTH THREE TIMES DAILY 540 capsule 1   latanoprost (XALATAN) 0.005 %  ophthalmic solution PLACE 1 DROP INTO BOTH EYES EVERY EVENING/BEDTIME     losartan (COZAAR) 50 MG tablet Take 1 tablet (50 mg total) by mouth daily. 30 tablet 1   metoprolol succinate (TOPROL-XL) 50 MG 24 hr tablet Take 1 tablet (50 mg total) by mouth at bedtime. Take with or immediately following a meal. 90 tablet 3   Omega-3 Fatty Acids (FISH OIL PO) Take by mouth.     No current facility-administered medications on file prior to visit.   Allergies  Allergen Reactions   Sulfamethoxazole-Trimethoprim     REACTION: rash: nausea, swelling   Hctz [Hydrochlorothiazide] Other (See Comments)    Elevated calcium   Family History  Problem Relation Age of Onset   Breast cancer Sister        20   Breast cancer Sister 62   Hypertension Father    Heart disease Father    Stroke Mother 23       cerebral hemorrhage   Hypertension Mother    Breast cancer Maternal Aunt     PE: LMP 04/02/1996  Wt Readings from Last 3 Encounters:  01/30/21 175 lb (79.4 kg)  01/24/21 174 lb 6.4 oz (79.1 kg)  12/08/20 176 lb 9.6 oz (80.1 kg)   Constitutional: overweight, in NAD Eyes: PERRLA, EOMI, no exophthalmos ENT: moist mucous membranes, no thyromegaly, no cervical lymphadenopathy Cardiovascular: RRR, No MRG Respiratory: CTA B Musculoskeletal: no deformities, strength intact in all 4 Skin: moist, warm, no rashes Neurological: no tremor with outstretched hands, DTR normal in all 4  Assessment: 1. Hypercalcemia/hyperparathyroidism  2.  Thyroid nodule  Plan: Patient with several instances of elevated calcium, with the highest being 11.4.  An intact PTH level was not suppressed, at 41, for a calcium of 10.6. -She has no history of vitamin D deficiency.  At last check, vitamin D level was normal: Lab Results  Component Value Date   VD25OH 37.4 01/28/2020  -She has no apparent complications from hypercalcemia: No history of nephrolithiasis, osteoporosis, fractures, abdominal pain, depression, bone  pain -We previously stopped her HCTZ and multivitamin.  We did discuss about the possibility of primary hyperparathyroidism versus familial hypocalciuric hypercalcemia, renal insufficiency, etc.  Her calcium levels were fluctuating in the past and the urine calcium was not elevated.  In fact, it was closer to the lower limit of normal.  I could not calculate a fractional excretion of calcium as a creatinine level was not obtained at the time of her urine collection, however, my suspicion for [place] was low, since she had numerous instances with normal calcium in the past and also, her magnesium level was not elevated. -She recently had a slightly high calcium again, at 10.4, on 02/01/2021.  At today's visit, I will recheck a ionized calcium.  Of note, this was normal at last visit. -We reviewed possible consequences of hyperparathyroidism: Osteoporosis, nephrolithiasis, however, new studies showed that mild hyperparathyroidism may be stable for many years and without significant complications. -For now, we decided to continue to follow her calcium without intervention if the calcium is only slightly high -If we do need to proceed with surgery, she did meet criteria for surgery (in bold): Increased calcium by 1 mg/dL above the upper limit of normal Kidney ds.  Osteoporosis (or vertebral fracture) Age <48 years old Newer criteria (2013): High  UCa >400 mg/d and increased stone risk by biochemical stone risk analysis Presence of nephrolithiasis or nephrocalcinosis Pt's preference!  -I will see her back in 1 year  2.  Thyroid nodule -N no neck compression symptoms -Per review of her thyroid ultrasound report from 2017: She had a small nodule, measuring 8 mm in the largest dimension -No follow-up was needed for such a small low risk nodule instead.  She has no risk factors for thyroid cancer -This thyroid nodule is not palpated today -No follow-up is needed unless the nodule appears to be growing or she  starts having neck compression symptoms -Latest TSH was normal 1 year ago  Philemon Kingdom, MD PhD [facility]

## 2021-03-24 DIAGNOSIS — M25551 Pain in right hip: Secondary | ICD-10-CM | POA: Diagnosis not present

## 2021-03-29 ENCOUNTER — Other Ambulatory Visit: Payer: Self-pay | Admitting: Family Medicine

## 2021-04-06 ENCOUNTER — Other Ambulatory Visit: Payer: Self-pay | Admitting: Obstetrics and Gynecology

## 2021-04-06 DIAGNOSIS — E2839 Other primary ovarian failure: Secondary | ICD-10-CM

## 2021-04-10 ENCOUNTER — Other Ambulatory Visit: Payer: 59

## 2021-04-10 NOTE — Progress Notes (Signed)
Patient ID: Laura Griffin, female   DOB: 1945-08-06, 76 y.o.   MRN: [id number]   This visit occurred during the SARS-CoV-2 public health emergency.  Safety protocols were in place, including screening questions prior to the visit, additional usage of staff PPE, and extensive cleaning of exam room while observing appropriate contact time as indicated for disinfecting solutions.   HPI  Laura Griffin is a 76 y.o.-year-old female, initially referred by her PCP, Dr. Ardelia Mems, returning for follow-up for hypercalcemia/hyperparathyroidism and thyroid nodule.  Last visit 1 year ago.  She missed (no showed) 2 appointments since then.  Reviewed history: She still has sciatica - chronic. She is on a mm relaxer.  She also has OA in R knee - sees Dr. Alphonzo Severance at [place]. She had a gel injection last fall >> helped some. No falls and fractures. Some pbs with balance.  Elevated calcium  - seen ~2005 when she was on calcium supplements.  She was advised to stop the supplements and calcium normalized  - however, in 2019, her calcium started to return elevated again.   - in 05/2019, we stopped multivitamins with calcium.  Subsequent PTH and calcium levels were normal. - in 01/2021, calcium was high again  I reviewed her pertinent labs: Component     Latest Ref Rng & Units 03/22/2020  Calcium Ionized     4.8 - 5.6 mg/dL 5.5   Lab Results  Component Value Date   PTH 40 09/23/2019   PTH 29 06/10/2019   PTH 41 01/13/2019   PTH Comment 01/13/2019   PTH 44.3 04/25/2011   PTH 23.8 08/28/2007   CALCIUM 10.4 (H) 02/01/2021   CALCIUM 9.9 12/12/2020   CALCIUM 10.4 09/23/2019   CALCIUM 10.5 06/15/2019   CALCIUM 10.6 (H) 01/13/2019   CALCIUM 10.9 (H) 12/30/2018   CALCIUM 11.4 (H) 12/22/2018   CALCIUM 11.2 (H) 12/15/2018   CALCIUM 10.3 09/02/2017   CALCIUM 10.5 (H) 07/02/2017   12/15/2018: Corrected calcium 10.48 (ULN 10.3).  No history of osteoporosis.  Reviewed DXA scan report from 2011:  T-scores were normal.  She has another DXA scan scheduled for May.  No history of kidney stones.  24-hour urine calcium (02/08/2019) was normal, with a calcium of 91 mg/24 h, however, urine creatinine level was not checked at that time so it was unclear whether this was an appropriate collection or not.  We checked this again but unfortunately a BMP was not drawn so we could not calculate the fractional excretion of calcium: Component     Latest Ref Rng & Units 06/12/2019  Creatinine, 24H Ur     0.50 - 2.15 g/24 h 0.77  Calcium, 24H Urine     mg/24 h 66   After stopping her calcium supplement (multivitamin), her calcium, PTH, calcitriol levels were normal, as were her phosphorus and magnesium Component     Latest Ref Rng & Units 06/10/2019  Vitamin D 1, 25 (OH) Total     18 - 72 pg/mL 30  Vitamin D3 1, 25 (OH)     pg/mL 30  Vitamin D2 1, 25 (OH)     pg/mL <8  PTH, Intact     15 - 65 pg/mL 29  Phosphorus     2.3 - 4.6 mg/dL 3.1  Magnesium     1.5 - 2.5 mg/dL 1.8   Component     Latest Ref Rng & Units 06/15/2019  Calcium     8.4 - 10.5 mg/dL 10.5   +  Mild CKD.  Latest BUN/Cr: Lab Results  Component Value Date   BUN 10 02/01/2021   BUN 10 12/12/2020   CREATININE 0.93 02/01/2021   CREATININE 0.93 12/12/2020   Previously on HCTZ, stopped in 08-12/2018.  No history of vitamin D deficiency: Lab Results  Component Value Date   VD25OH 37.4 01/28/2020   VD25OH 30.8 09/23/2019   VD25OH 31.79 05/26/2019   She was previously on calcium (multivitamins - 300 mg) + 1000 units vitamin D daily.  We stopped her calcium and I advised her to increase her vitamin D daily dose to 2000 units daily.  She is now on this dose.  She denies a FH of hypercalcemia, pituitary tumors, thyroid cancer, or osteoporosis.   Pt. also has a history of HTN, OA, GERD (not on PPIs), sciatica.  Her mother died of cerebral hemorrhage in her late 74J as a complication of uncontrolled hypertension.  Thyroid  nodule: -Subcentimeter -Without worrisome features  Reviewed her thyroid ultrasound report from 2017: Right thyroid lobe : 2.6 cm x 0.8 cm x 1.7 cm. Heterogeneous appearance of the right thyroid tissue without significantly increased flow.   Left thyroid lobe : 3.6 cm x 1.2 cm x 1.3 cm. Heterogeneous appearance of left-sided thyroid tissue without significantly increased flow. Nodule at the inferior left thyroid measures 8 mm x 6 mm x 7 mm with no internal calcifications.  Isthmus Thickness: 5 mm.  No nodules visualized.  Lymphadenopathy: None visualized.   IMPRESSION: Heterogeneous thyroid with single left-sided nodule.   Findings do not meet current SRU consensus criteria for biopsy. Follow-up by clinical exam is recommended. In a patient of this age with no risk factors for thyroid carcinoma, lengthening the surveillance interval beyond 12 months is a reasonable strategy, as thyroid cancers <2cm are known to be indolent with nearly 100% 10-year survival. If the patient has known risk factors for thyroid carcinoma, a follow-up ultrasound in 12 months is recommended.  Pt denies: - feeling nodules in neck - hoarseness - dysphagia - choking - SOB with lying down  No family history of thyroid cancer.  No history of radiation to head or neck.  Latest TSH: Lab Results  Component Value Date   TSH 1.46 03/22/2020   TSH 1.87 05/26/2019   TSH 1.638 05/05/2015   TSH 0.807 07/06/2013   TSH 1.196 10/03/2010   TSH 1.116 11/11/2007   ROS: + see HPI Musculoskeletal: + muscle aches/no joint aches  I reviewed pt's medications, allergies, PMH, social hx, family hx, and changes were documented in the history of present illness. Otherwise, unchanged from my initial visit note.  Past Medical History:  Diagnosis Date   Allergic rhinitis 07/19/2014   Arthritis of knee, right 03/11/2006   Bursitis of shoulder, right 09/19/1999   Cervical radiculopathy at C6 05/24/2011   Beginning Nov  2012. Multi-level foraminal narrowing in C-spine plain films    Diverticulosis    DIVERTICULOSIS OF COLON 05/30/2006   Colonoscopy 06/2014 - multiple diverticula, no other abnormalities, no further colonoscopies recommended given patient's age of 14     GERD (gastroesophageal reflux disease) 07/19/2014   Diagnosed clinically by [facility] in January of 2016.     Glaucoma    Hearing decreased 08/07/2011   With whooshing tinnitus Mild bilateral loss on 08/2012 audiology who will recheck her hearing in one year     History of herniated intervertebral disc    Hypertension    HYPERTENSION, BENIGN SYSTEMIC 05/30/2006  Left sided sciatica 8/95,11/00   CT confirmed    LOW BACK PAIN SYNDROME 05/10/2009   Qualifier: Diagnosis of  By: Javier Glazier CMA,, [place]  Mild C 3-4, 4-5 lumbar spinal stenosis MRI 08/2011, Diffuse lower thoracic and lumbar DDD    Mitral valve prolapse    per patient heart murmur   Orthostatic hypotension 06/12/2013   Osteoarthritis    Osteoarthritis of right knee 12/26/2012   Confirmed on xray 07/2014    OSTEOARTHRITIS, MULTI SITES 05/30/2006   She is stoic about pain and wants to avoid surgeries Past xrays have documented DJD in right knee and lumbar spine     Overweight (BMI 25.0-29.9) 05/30/2006   Pitting edema 07/19/2014   Bilateral 1+ pitting edema of calves first noted on 07/19/2014.     Sciatica 05/30/2006   Qualifier: Diagnosis of  By: Walker Kehr MD, [place]  Involved left leg in 02/1999    Swallowing difficulty 07/06/2013   Urge incontinence 06/01/2008   Qualifier: Diagnosis of  By: Walker Kehr MD, Patrick [place]     Past Surgical History:  Procedure Laterality Date   CATARACT EXTRACTION  02/2011   left eye   [place] BIOPSY  2010   for hypopigmentation, non-specific result   Social History   Socioeconomic History   Marital status: Married    Spouse name: Jeneen Rinks   Number of children: 1   Years of education: 18   Highest education level: Master's degree  (e.g., MA, MS, MEng, MEd, MSW, MBA)  Occupational History   Occupation: retired    Fish farm manager: [employer]    Comment: career development  Tobacco Use   Smoking status: Former    Packs/day: 0.50    Years: 15.00    Pack years: 7.50    Types: Cigarettes    Quit date: 04/02/1994    Years since quitting: 27.0   Smokeless tobacco: Never  Vaping Use   Vaping Use: Never used  Substance and Sexual Activity   Alcohol use: No   Drug use: No   Sexual activity: Not Currently    Partners: Male    Birth control/protection: Surgical    Comment: BTSP  Other Topics Concern   Not on file  Social History Narrative   Patient lives with husband in [place].    Patient has one son and two grandchildren who live nearby.   Patient enjoys seeing her grandchildren grow up and watching their sporting events.    Patient enjoys reading and her book club, crossword puzzles, and shopping.   Husband in remission for multiple myeloma and doing well.          Social Determinants of Health   Financial Resource Strain: Not on file  Food Insecurity: Not on file  Transportation Needs: Not on file  Physical Activity: Not on file  Stress: Not on file  Social Connections: Not on file  Intimate Partner Violence: Not on file   Current Outpatient Medications on File Prior to Visit  Medication Sig Dispense Refill   acetaminophen (TYLENOL) 650 MG CR tablet Take 2 tablets (1,300 mg total) by mouth every 8 (eight) hours as needed. (Patient taking differently: Take 1,300 mg by mouth 3 (three) times daily.) 90 tablet 1   amLODipine (NORVASC) 10 MG tablet TAKE 1 TABLET(10 MG) BY MOUTH AT BEDTIME 90 tablet 3   aspirin EC 81 MG tablet Take 81 mg by mouth daily.     betamethasone valerate ointment (VALISONE) 0.1 % Apply 1 application topically  2 (two) times daily. Use for 2 weeks at a time as needed. Use twice a week at bed time for maintenance dosing. 45 g 0   brimonidine-timolol (COMBIGAN) 0.2-0.5 % ophthalmic  solution Place 2 drops into both eyes every 12 (twelve) hours.     Cholecalciferol 25 MCG (1000 UT) capsule Take 2,000 Units by mouth daily.     gabapentin (NEURONTIN) 400 MG capsule TAKE 2 CAPSULES(800 MG) BY MOUTH THREE TIMES DAILY 540 capsule 1   latanoprost (XALATAN) 0.005 % ophthalmic solution PLACE 1 DROP INTO BOTH EYES EVERY EVENING/BEDTIME     losartan (COZAAR) 50 MG tablet TAKE 1 TABLET(50 MG) BY MOUTH DAILY 30 tablet 1   metoprolol succinate (TOPROL-XL) 50 MG 24 hr tablet Take 1 tablet (50 mg total) by mouth at bedtime. Take with or immediately following a meal. 90 tablet 3   Omega-3 Fatty Acids (FISH OIL PO) Take by mouth.     No current facility-administered medications on file prior to visit.   Allergies  Allergen Reactions   Sulfamethoxazole-Trimethoprim     REACTION: rash: nausea, swelling   Hctz [Hydrochlorothiazide] Other (See Comments)    Elevated calcium   Family History  Problem Relation Age of Onset   Breast cancer Sister        47   Breast cancer Sister 31   Hypertension Father    Heart disease Father    Stroke Mother 85       cerebral hemorrhage   Hypertension Mother    Breast cancer Maternal Aunt     PE: BP 130/88 (BP Location: Right Arm, Patient Position: Sitting, Cuff Size: Normal)    Pulse (!) 54    Ht 5' 2.5" (1.588 m)    Wt 172 lb (78 kg)    LMP 04/02/1996    SpO2 99%    BMI 30.96 kg/m  Wt Readings from Last 3 Encounters:  04/11/21 172 lb (78 kg)  01/30/21 175 lb (79.4 kg)  01/24/21 174 lb 6.4 oz (79.1 kg)   Constitutional: overweight, in NAD Eyes: PERRLA, EOMI, no exophthalmos ENT: moist mucous membranes, no thyromegaly, no cervical lymphadenopathy Cardiovascular: RRR, No MRG Respiratory: CTA B Musculoskeletal: no deformities, strength intact in all 4 Skin: moist, warm, no rashes Neurological: no tremor with outstretched hands, DTR normal in all 4  Assessment: 1. Hypercalcemia/hyperparathyroidism  2.  Thyroid nodule  Plan: Patient  with several instances of elevated calcium, with the highest being 11.4.  An intact PTH was not suppressed, at 41, for a calcium of 10.6. -She has no history of vitamin D deficiency.  Vitamin D level was normal at last check: Lab Results  Component Value Date   VD25OH 37.4 01/28/2020  -She has no apparent complications from hypercalcemia, no history of nephrolithiasis, osteoporosis, fractures, abdominal pain, bone pain, depression. -We previously stopped her HCTZ and multivitamin.  We did discuss about the possibility of primary hyperparathyroidism versus familial hypocalciuric hypercalcemia, renal insufficiency, etc.  Her calcium levels were fluctuating in the past and the urine calcium was not elevated.  In fact, it was closer to the lower limit of normal.  I could not calculate a fractional excretion of calcium as a creatinine level was not obtained at the time of her urine collection, however, my suspicion for [place] was low, since she had numerous instances with normal calcium in the past and also, her magnesium level was not elevated -She recently had a very slightly calcium again, at 10.4, in 02/01/2021.  At today's visit,  I will recheck an ionized calcium.  Of note, this was normal at last visit. -We reviewed possible consequences of hyperparathyroidism: Osteoporosis, nephrolithiasis, however, new studies showed that mild hyperparathyroidism may be stable for many years and without significant complications. -We decided to continue to follow her calcium without intervention if the calcium is only slightly high -If we do need surgery, she does meet criteria for this (in bold): Increased calcium by 1 mg/dL above the upper limit of normal Kidney ds.  Osteoporosis (or vertebral fracture) Age <49 years old Newer criteria (2013): High UCa >400 mg/d and increased stone risk by biochemical stone risk analysis Presence of nephrolithiasis or nephrocalcinosis Pt's preference!  -I will see her back in 1  year  2.  Thyroid nodule -No neck compression symptoms -Per review of her thyroid ultrasound report from 2007: She had a small nodule, measuring 8 mm in the largest dimension -No follow-up was needed for such a small, low risk, nodule, especially as she had no risk factors for thyroid cancer -The thyroid nodule is not palpated today -No follow-up is needed for now -Latest TSH was reviewed and this was normal a year ago: Lab Results  Component Value Date   TSH 1.46 03/22/2020   Component     Latest Ref Rng & Units 04/11/2021  VITD     30.00 - 100.00 ng/mL 37.56  Calcium Ionized     4.8 - 5.6 mg/dL 5.57  Ionized calcium and vitamin D level normal.  No intervention needed for now.  Philemon Kingdom, MD PhD [facility]

## 2021-04-11 ENCOUNTER — Encounter: Payer: Self-pay | Admitting: Internal Medicine

## 2021-04-11 ENCOUNTER — Other Ambulatory Visit: Payer: Self-pay

## 2021-04-11 ENCOUNTER — Ambulatory Visit (INDEPENDENT_AMBULATORY_CARE_PROVIDER_SITE_OTHER): Payer: Medicare Other | Admitting: Internal Medicine

## 2021-04-11 DIAGNOSIS — E041 Nontoxic single thyroid nodule: Secondary | ICD-10-CM | POA: Diagnosis not present

## 2021-04-11 LAB — VITAMIN D 25 HYDROXY (VIT D DEFICIENCY, FRACTURES): VITD: 37.56 ng/mL (ref 30.00–100.00)

## 2021-04-11 NOTE — Patient Instructions (Signed)
Please stop at the lab.  Continue 2000 units vit D daily.  Please come back for a follow-up appointment in 1 year. 

## 2021-04-12 LAB — CALCIUM, IONIZED: Calcium, Ion: 5.57 mg/dL (ref 4.8–5.6)

## 2021-04-13 ENCOUNTER — Telehealth: Payer: Self-pay

## 2021-04-13 NOTE — Telephone Encounter (Addendum)
LVM for pt with results. Advised to call back with any questions or concerns. ----- Message from Carlus Pavlov, MD sent at 04/13/2021  8:41 AM EST ----- Can you please call pt.:  Ionized calcium and vitamin D level are normal.  No intervention needed for now.

## 2021-05-03 ENCOUNTER — Ambulatory Visit
Admission: RE | Admit: 2021-05-03 | Discharge: 2021-05-03 | Disposition: A | Discharge status: Home / Self Care | Payer: Medicare Other | Source: Ambulatory Visit | Attending: Obstetrics and Gynecology | Admitting: Obstetrics and Gynecology

## 2021-05-03 DIAGNOSIS — E2839 Other primary ovarian failure: Secondary | ICD-10-CM

## 2021-05-03 DIAGNOSIS — Z780 Asymptomatic menopausal state: Secondary | ICD-10-CM | POA: Diagnosis not present

## 2021-05-29 ENCOUNTER — Other Ambulatory Visit: Payer: Self-pay | Admitting: *Deleted

## 2021-05-30 ENCOUNTER — Other Ambulatory Visit: Payer: Self-pay | Admitting: Family Medicine

## 2021-05-30 MED ORDER — LOSARTAN POTASSIUM 50 MG PO TABS
ORAL_TABLET | ORAL | 1 refills | Status: DC
Start: 2021-05-30 — End: 2021-05-30

## 2021-05-30 MED ORDER — LOSARTAN POTASSIUM 50 MG PO TABS: ORAL_TABLET | ORAL | 1 refills | Status: DC

## 2021-06-11 ENCOUNTER — Other Ambulatory Visit: Payer: Self-pay | Admitting: Family Medicine

## 2021-06-14 ENCOUNTER — Ambulatory Visit (INDEPENDENT_AMBULATORY_CARE_PROVIDER_SITE_OTHER): Payer: Medicare Other

## 2021-06-14 ENCOUNTER — Other Ambulatory Visit: Payer: Self-pay

## 2021-06-14 DIAGNOSIS — Z0000 Encounter for general adult medical examination without abnormal findings: Secondary | ICD-10-CM | POA: Diagnosis not present

## 2021-06-14 NOTE — Progress Notes (Signed)
I have reviewed this visit and agree with the documentation.  ?Zayneb Baucum J Taresa [place], MD  ?

## 2021-06-14 NOTE — Progress Notes (Signed)
Subjective:   Laura Griffin is a 76 y.o. female who presents for Medicare Annual (Subsequent) preventive examination.  Patient consented to have virtual visit and was identified by name and date of birth. Method of visit: Telephone  Encounter participants: Patient: Laura Griffin - located at Home Nurse/Provider: Steva Colder - located at [facility] Others (if applicable): NA  Review of Systems: Defer to PCP  Cardiac Risk Factors include: advanced age (>36men, >15 women);hypertension  Objective:   Vitals: LMP 04/02/1996   There is no height or weight on file to calculate BMI.  Advanced Directives 06/14/2021 11/08/2020 02/29/2020 02/17/2020 01/28/2020 12/15/2018 01/17/2018  Does Patient Have a Medical Advance Directive? Yes No Yes Yes No No No  Type of Estate agent of [place];Living will - Healthcare Power of [place];Living will Healthcare Power of [place];Living will - - -  Does patient want to make changes to medical advance directive? No - Patient declined - - - - - -  Copy of Healthcare Power of Attorney in Chart? No - copy requested - No - copy requested No - copy requested - - -  Would patient like information on creating a medical advance directive? - No - Patient declined No - Patient declined No - Patient declined No - Patient declined No - Patient declined No - Patient declined   Tobacco Social History   Tobacco Use  Smoking Status Former   Packs/day: 0.50   Years: 15.00   Pack years: 7.50   Types: Cigarettes   Quit date: 04/02/1994   Years since quitting: 27.2  Smokeless Tobacco Never     Counseling given: No plans to restart.  Clinical Intake:  Pre-visit preparation completed: Yes  Diabetes: No  How often do you need to have someone help you when you read instructions, pamphlets, or other written materials from your doctor or pharmacy?: 1 - Never What is the last grade level you completed in school?: Masters Degree  Interpreter  Needed?: No  Past Medical History:  Diagnosis Date   Allergic rhinitis 07/19/2014   Arthritis of knee, right 03/11/2006   Bursitis of shoulder, right 09/19/1999   Cervical radiculopathy at C6 05/24/2011   Beginning Nov 2012. Multi-level foraminal narrowing in C-spine plain films    Diverticulosis    DIVERTICULOSIS OF COLON 05/30/2006   Colonoscopy 06/2014 - multiple diverticula, no other abnormalities, no further colonoscopies recommended given patient's age of 80     GERD (gastroesophageal reflux disease) 07/19/2014   Diagnosed clinically by [facility] in January of 2016.     Glaucoma    Hearing decreased 08/07/2011   With whooshing tinnitus Mild bilateral loss on 08/2012 audiology who will recheck her hearing in one year     History of herniated intervertebral disc    Hypertension    HYPERTENSION, BENIGN SYSTEMIC 05/30/2006        Left sided sciatica 8/95,11/00   CT confirmed    LOW BACK PAIN SYNDROME 05/10/2009   Qualifier: Diagnosis of  By: Lorenda Hatchet CMA,, [place]  Mild C 3-4, 4-5 lumbar spinal stenosis MRI 08/2011, Diffuse lower thoracic and lumbar DDD    Mitral valve prolapse    per patient heart murmur   Orthostatic hypotension 06/12/2013   Osteoarthritis    Osteoarthritis of right knee 12/26/2012   Confirmed on xray 07/2014    OSTEOARTHRITIS, MULTI SITES 05/30/2006   She is stoic about pain and wants to avoid surgeries Past xrays have documented DJD in right knee and lumbar  spine     Overweight (BMI 25.0-29.9) 05/30/2006   Pitting edema 07/19/2014   Bilateral 1+ pitting edema of calves first noted on 07/19/2014.     Sciatica 05/30/2006   Qualifier: Diagnosis of  By: Sheffield Slider MD, Wayne  Involved left leg in 02/1999    Swallowing difficulty 07/06/2013   Urge incontinence 06/01/2008   Qualifier: Diagnosis of  By: Sheffield Slider MD, Deniece Portela     Past Surgical History:  Procedure Laterality Date   CATARACT EXTRACTION  02/2011   left eye   TUBAL LIGATION  1973   VULVA /PERINEUM BIOPSY  2010   for  hypopigmentation, non-specific result   Family History  Problem Relation Age of Onset   Stroke Mother 47       cerebral hemorrhage   Hypertension Mother    Hypertension Father    Heart disease Father    Breast cancer Sister        1996   Breast cancer Sister 16   Breast cancer Maternal Aunt    Social History   Socioeconomic History   Marital status: Married    Spouse name: Laura Griffin   Number of children: 1   Years of education: 18   Highest education level: Master's degree (e.g., MA, MS, MEng, MEd, MSW, MBA)  Occupational History   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  Tobacco Use   Smoking status: Former    Packs/day: 0.50    Years: 15.00    Pack years: 7.50    Types: Cigarettes    Quit date: 04/02/1994    Years since quitting: 27.2   Smokeless tobacco: Never  Vaping Use   Vaping Use: Never used  Substance and Sexual Activity   Alcohol use: No   Drug use: No   Sexual activity: Not Currently    Partners: Male    Birth control/protection: Surgical    Comment: BTSP  Other Topics Concern   Not on file  Social History Narrative   Patient lives with husband in [place].    Patient has one son and two grandchildren who live nearby.   Patient enjoys seeing her grandchildren grow up and watching their sporting events.    Patient enjoys reading and her book club, crossword puzzles, and shopping.   Husband in remission for multiple myeloma and doing well.          Social Determinants of Health   Financial Resource Strain: Low Risk    Difficulty of Paying Living Expenses: Not hard at all  Food Insecurity: No Food Insecurity   Worried About Programme researcher, broadcasting/film/video in the Last Year: Never true   Ran Out of Food in the Last Year: Never true  Transportation Needs: No Transportation Needs   Lack of Transportation (Medical): No   Lack of Transportation (Non-Medical): No  Physical Activity: Inactive   Days of Exercise per Week: 0  days   Minutes of Exercise per Session: 0 min  Stress: No Stress Concern Present   Feeling of Stress : Not at all  Social Connections: Moderately Integrated   Frequency of Communication with Friends and Family: More than three times a week   Frequency of Social Gatherings with Friends and Family: More than three times a week   Attends Religious Services: Never   Database administrator or Organizations: Yes   Attends Engineer, structural: More than 4 times per year   Marital Status: Married   Outpatient Encounter  Medications as of 06/14/2021  Medication Sig   acetaminophen (TYLENOL) 650 MG CR tablet Take 2 tablets (1,300 mg total) by mouth every 8 (eight) hours as needed. (Patient taking differently: Take 1,300 mg by mouth 3 (three) times daily.)   amLODipine (NORVASC) 10 MG tablet TAKE 1 TABLET(10 MG) BY MOUTH AT BEDTIME   aspirin EC 81 MG tablet Take 81 mg by mouth daily.   betamethasone valerate ointment (VALISONE) 0.1 % Apply 1 application topically 2 (two) times daily. Use for 2 weeks at a time as needed. Use twice a week at bed time for maintenance dosing.   brimonidine-timolol (COMBIGAN) 0.2-0.5 % ophthalmic solution Place 2 drops into both eyes every 12 (twelve) hours.   Cholecalciferol 25 MCG (1000 UT) capsule Take 2,000 Units by mouth daily.   gabapentin (NEURONTIN) 400 MG capsule TAKE 2 CAPSULES(800 MG) BY MOUTH THREE TIMES DAILY   latanoprost (XALATAN) 0.005 % ophthalmic solution PLACE 1 DROP INTO BOTH EYES EVERY EVENING/BEDTIME   losartan (COZAAR) 50 MG tablet Take one by mouth daily for blood pressure   metoprolol succinate (TOPROL-XL) 50 MG 24 hr tablet Take 1 tablet (50 mg total) by mouth at bedtime. Take with or immediately following a meal.   Omega-3 Fatty Acids (FISH OIL PO) Take by mouth.   No facility-administered encounter medications on file as of 06/14/2021.   Activities of Daily Living In your present state of health, do you have any difficulty performing  the following activities: 06/14/2021  Hearing? N  Vision? N  Difficulty concentrating or making decisions? N  Walking or climbing stairs? Y  Dressing or bathing? N  Doing errands, shopping? N  Preparing Food and eating ? N  Using the Toilet? N  In the past six months, have you accidently leaked urine? Y  Do you have problems with loss of bowel control? N  Managing your Medications? N  Managing your Finances? N  Housekeeping or managing your Housekeeping? N  Some recent data might be hidden   Patient Care Team: Latrelle Dodrill, MD as PCP - General (Family Medicine) Glenford Peers, OD (Optometry) Iona Hansen, DDS (Dentistry) Bernette Redbird, MD as Consulting Physician (Gastroenterology) August Saucer, Corrie Mckusick, MD as Consulting Physician (Orthopedic Surgery) Ardell Isaacs, Forrestine Him, MD as Consulting Physician (Obstetrics and Gynecology) Carlus Pavlov, MD as Consulting Physician (Internal Medicine)    Assessment:   This is a routine wellness examination for Claudelle.  Exercise Activities and Dietary recommendations Current Exercise Habits: The patient does not participate in regular exercise at present, Exercise limited by: orthopedic condition(s);cardiac condition(s)   Goals       Acknowledge receipt of Advanced Directive package     Patient reports having an advanced directive. We do not have copies. Bring paperwork to be scanned into chart.      Exercise 3x per week (30 min per time)     Patient is currently walking one time per week for 20 minutes.  Encouraged to aim for 1-2 more times per week if the weather permits.        Fall Risk Fall Risk  06/14/2021 11/08/2020 02/17/2020 01/28/2020 06/26/2019  Falls in the past year? 0 0 0 0 0  Number falls in past yr: 0 0 - 0 0  Injury with Fall? 0 0 - 0 -  Risk for fall due to : Orthopedic patient - Impaired balance/gait - -  Follow up - - - - Falls evaluation completed   Patient denies an falls in a "  long time."  Patient does report using a cane to ambulate when outside of her home due to arthritis.   Is the patient's home free of loose throw rugs in walkways, pet beds, electrical cords, etc?   yes      Grab bars in the bathroom? yes      Handrails on the stairs?   yes      Adequate lighting?   yes  Patient rating of health (0-10) scale: 8   Depression Screen PHQ 2/9 Scores 06/14/2021 06/14/2021 11/08/2020 05/10/2020  PHQ - 2 Score 0 0 0 0  PHQ- 9 Score - - 1 1    Cognitive Function MMSE - Mini Mental State Exam 01/06/2015 07/09/2013 10/23/2010  Not completed: (No Data) - -  Orientation to time - 5 5  Orientation to Place - 5 5  Registration - 3 3  Attention/ Calculation - 5 5  Recall - 3 3  Language- name 2 objects - 2 2  Language- repeat - 1 1  Language- follow 3 step command - 3 3  Language- read & follow direction - 1 1  Write a sentence - 1 1  Copy design - 1 1  Total score - 30 30   6CIT Screen 06/14/2021 02/17/2020  What Year? 0 points 0 points  What month? 0 points 0 points  What time? 0 points 0 points  Count back from 20 0 points 0 points  Months in reverse 0 points 0 points  Repeat phrase 0 points 0 points  Total Score 0 0   Immunization History  Administered Date(s) Administered   Fluad Quad(high Dose 65+) 12/15/2018, 01/08/2020, 01/03/2021   Influenza Split 02/20/2011, 01/16/2012   Influenza Whole 01/17/2009, 01/10/2010   Influenza, High Dose Seasonal PF 01/01/2017, 01/17/2018   Influenza,inj,Quad PF,6+ Mos 12/26/2012, 12/21/2013, 01/06/2015, 01/05/2016   Influenza-Unspecified 12/25/2016   PFIZER(Purple Top)SARS-COV-2 Vaccination 04/22/2019, 05/13/2019, 01/28/2020, 09/07/2020   Pfizer Covid-19 Vaccine Bivalent Booster 76yrs & up 01/18/2021   Pneumococcal Conjugate-13 07/09/2013   Pneumococcal Polysaccharide-23 07/13/2008   Td 04/02/2004   Tdap 04/02/2006, 09/02/2017   Zoster Recombinat (Shingrix) 06/03/2020, 08/09/2020   Zoster, Live 04/02/2012   Screening  Tests Health Maintenance  Topic Date Due   MAMMOGRAM  09/07/2021   TETANUS/TDAP  09/03/2027   Pneumonia Vaccine 45+ Years old  Completed   INFLUENZA VACCINE  Completed   DEXA SCAN  Completed   COVID-19 Vaccine  Completed   Hepatitis C Screening  Completed   Zoster Vaccines- Shingrix  Completed   HPV VACCINES  Aged Out   COLONOSCOPY (Pts 45-52yrs Insurance coverage will need to be confirmed)  Discontinued   Cancer Screenings: Lung: Low Dose CT Chest recommended if Age 46-80 years, 20 pack-year currently smoking OR have quit w/in 15years. Patient does not qualify. Breast:  Up to date on Mammogram? Yes  09/07/2020 Up to date of Bone Density/Dexa? Yes Colorectal: UTD 06/09/2014  Additional Screenings: Hepatitis C Screening: Completed  Plan:  PCP apt scheduled for 4/4. Mammogram due 08/2021. Continue monitoring blood pressures at home.   I have personally reviewed and noted the following in the patients chart:   Medical and social history Use of alcohol, tobacco or illicit drugs  Current medications and supplements Functional ability and status Nutritional status Physical activity Advanced directives List of other physicians Hospitalizations, surgeries, and ER visits in previous 12 months Vitals Screenings to include cognitive, depression, and falls Referrals and appointments  In addition, I have reviewed and discussed with patient certain  preventive protocols, quality metrics, and best practice recommendations. A written personalized care plan for preventive services as well as general preventive health recommendations were provided to patient.  This visit was conducted virtually in the setting of the COVID19 pandemic.    Steva Colder, CMA  06/14/2021

## 2021-06-14 NOTE — Patient Instructions (Signed)
Thank you for taking time to come for your Medicare Wellness Visit. I appreciate your ongoing commitment to your health goals. Please review the following plan we discussed and let me know if I can assist you in the future.  ?  ?These are the goals we discussed: ? ? Goals   ? ?   Acknowledge receipt of Advanced Directive package   ?  Patient reports having an advanced directive. We do not have copies. ?Bring paperwork to be scanned into chart.  ?  ?  Exercise 3x per week (30 min per time)   ?  Patient is currently walking one time per week for 20 minutes.  ?Encouraged to aim for 1-2 more times per week if the weather permits.  ?  ? ?  ? ?We also discussed recommended health maintenance. As discussed, you are up to date with everything! ?Health Maintenance  ?Topic Date Due  ? MAMMOGRAM  09/07/2021  ? TETANUS/TDAP  09/03/2027  ? Pneumonia Vaccine 49+ Years old  Completed  ? INFLUENZA VACCINE  Completed  ? DEXA SCAN  Completed  ? COVID-19 Vaccine  Completed  ? Hepatitis C Screening  Completed  ? Zoster Vaccines- Shingrix  Completed  ? HPV VACCINES  Aged Out  ? COLONOSCOPY (Pts 45-24yr Insurance coverage will need to be confirmed)  Discontinued  ? ?PCP apt scheduled for 4/4. ?Mammogram due 08/2021. ?Continue monitoring blood pressures at home.  ? ?Preventive Care 642Years and Older, Female ?Preventive care refers to lifestyle choices and visits with your health care provider that can promote health and wellness. Preventive care visits are also called wellness exams. ?What can I expect for my preventive care visit? ?Counseling ?Your health care provider may ask you questions about your: ?Medical history, including: ?Past medical problems. ?Family medical history. ?Pregnancy and menstrual history. ?History of falls. ?Current health, including: ?Memory and ability to understand (cognition). ?Emotional well-being. ?Home life and relationship well-being. ?Sexual activity and sexual health. ?Lifestyle, including: ?Alcohol,  nicotine or tobacco, and drug use. ?Access to firearms. ?Diet, exercise, and sleep habits. ?Work and work eStatistician ?Sunscreen use. ?Safety issues such as seatbelt and bike helmet use. ?Physical exam ?Your health care provider will check your: ?Height and weight. These may be used to calculate your BMI (body mass index). BMI is a measurement that tells if you are at a healthy weight. ?Waist circumference. This measures the distance around your waistline. This measurement also tells if you are at a healthy weight and may help predict your risk of certain diseases, such as type 2 diabetes and high blood pressure. ?Heart rate and blood pressure. ?Body temperature. ?Skin for abnormal spots. ?What immunizations do I need? ?Vaccines are usually given at various ages, according to a schedule. Your health care provider will recommend vaccines for you based on your age, medical history, and lifestyle or other factors, such as travel or where you work. ?What tests do I need? ?Screening ?Your health care provider may recommend screening tests for certain conditions. This may include: ?Lipid and cholesterol levels. ?Hepatitis C test. ?Hepatitis B test. ?HIV (human immunodeficiency virus) test. ?STI (sexually transmitted infection) testing, if you are at risk. ?Lung cancer screening. ?Colorectal cancer screening. ?Diabetes screening. This is done by checking your blood sugar (glucose) after you have not eaten for a while (fasting). ?Mammogram. Talk with your health care provider about how often you should have regular mammograms. ?BRCA-related cancer screening. This may be done if you have a family history of breast, ovarian,  tubal, or peritoneal cancers. ?Bone density scan. This is done to screen for osteoporosis. ?Talk with your health care provider about your test results, treatment options, and if necessary, the need for more tests. ?Follow these instructions at home: ?Eating and drinking ? ?Eat a diet that includes fresh  fruits and vegetables, whole grains, lean protein, and low-fat dairy products. Limit your intake of foods with high amounts of sugar, saturated fats, and salt. ?Take vitamin and mineral supplements as recommended by your health care provider. ?Do not drink alcohol if your health care provider tells you not to drink. ?If you drink alcohol: ?Limit how much you have to 0-1 drink a day. ?Know how much alcohol is in your drink. In the [place] one drink equals one 12 oz bottle of beer (355 mL), one 5 oz glass of wine (148 mL), or one 1? oz glass of hard liquor (44 mL). ?Lifestyle ?Brush your teeth every morning and night with fluoride toothpaste. Floss one time each day. ?Exercise for at least 30 minutes 5 or more days each week. ?Do not use any products that contain nicotine or tobacco. These products include cigarettes, chewing tobacco, and vaping devices, such as e-cigarettes. If you need help quitting, ask your health care provider. ?Do not use drugs. ?If you are sexually active, practice safe sex. Use a condom or other form of protection in order to prevent STIs. ?Take aspirin only as told by your health care provider. Make sure that you understand how much to take and what form to take. Work with your health care provider to find out whether it is safe and beneficial for you to take aspirin daily. ?Ask your health care provider if you need to take a cholesterol-lowering medicine (statin). ?Find healthy ways to manage stress, such as: ?Meditation, yoga, or listening to music. ?Journaling. ?Talking to a trusted person. ?Spending time with friends and family. ?Minimize exposure to UV radiation to reduce your risk of skin cancer. ?Safety ?Always wear your seat belt while driving or riding in a vehicle. ?Do not drive: ?If you have been drinking alcohol. Do not ride with someone who has been drinking. ?When you are tired or distracted. ?While texting. ?If you have been using any mind-altering substances or drugs. ?Wear a  helmet and other protective equipment during sports activities. ?If you have firearms in your house, make sure you follow all gun safety procedures. ?What's next? ?Visit your health care provider once a year for an annual wellness visit. ?Ask your health care provider how often you should have your eyes and teeth checked. ?Stay up to date on all vaccines. ?This information is not intended to replace advice given to you by your health care provider. Make sure you discuss any questions you have with your health care provider. ?Document Revised: 09/14/2020 Document Reviewed: 09/14/2020 ?Elsevier Patient Education ? 2022 [place]. ? ? ?Our clinic's number is [phone]. Please call with questions or concerns about what we discussed today.  ?  ?

## 2021-07-04 ENCOUNTER — Ambulatory Visit (INDEPENDENT_AMBULATORY_CARE_PROVIDER_SITE_OTHER): Payer: Medicare Other | Admitting: Family Medicine

## 2021-07-04 ENCOUNTER — Encounter: Payer: Self-pay | Admitting: Family Medicine

## 2021-07-04 ENCOUNTER — Other Ambulatory Visit: Payer: Self-pay

## 2021-07-04 DIAGNOSIS — N393 Stress incontinence (female) (male): Secondary | ICD-10-CM | POA: Diagnosis not present

## 2021-07-04 DIAGNOSIS — H9193 Unspecified hearing loss, bilateral: Secondary | ICD-10-CM | POA: Diagnosis not present

## 2021-07-04 DIAGNOSIS — I10 Essential (primary) hypertension: Secondary | ICD-10-CM

## 2021-07-04 MED ORDER — MIRABEGRON ER 25 MG PO TB24: 25.0000 mg | ORAL_TABLET | Freq: Every day | ORAL | 0 refills | Status: DC

## 2021-07-04 MED ORDER — LOSARTAN POTASSIUM 50 MG PO TABS: ORAL_TABLET | ORAL | 1 refills | Status: DC

## 2021-07-04 NOTE — Patient Instructions (Signed)
It was great to see you again today! ? ?Refilled losartan ?See exercises below ?Referring to audiology ?Sent in [place], medication for incontinence ? ?Be well, ?Dr. Pollie Meyer  ? ?Shoulder Range of Motion Exercises ?Shoulder range of motion (ROM) exercises are done to keep the shoulder moving freely or to increase movement. They are often recommended for people who have shoulder pain or stiffness or who are recovering from a shoulder surgery. ?Phase 1 exercises ?When you are able, do this exercise 1-2 times per day for 30-60 seconds in each direction, or as directed by your health care provider. ?Pendulum exercise ?To do this exercise while sitting: ?Sit in a chair or at the edge of your bed with your feet flat on the floor. ?Let your affected arm hang down in front of you over the edge of the bed or chair. ?Relax your shoulder, arm, and hand. ?Rock your body so your arm gently swings in small circles. You can also use your unaffected arm to start the motion. ?Repeat changing the direction of the circles, swinging your arm left and right, and swinging your arm forward and back. ?To do this exercise while standing: ?Stand next to a sturdy chair or table, and hold on to it with your hand on your unaffected side. ?Bend forward at the waist. ?Bend your knees slightly. ?Relax your shoulder, arm, and hand. ?While keeping your shoulder relaxed, use body motion to swing your arm in small circles. ?Repeat changing the direction of the circles, swinging your arm left and right, and swinging your arm forward and back. ?Between exercises, stand up tall and take a short break to relax your lower back. ? ?Phase 2 exercises ?Do these exercises 1-2 times per day or as told by your health care provider. Hold each stretch for 30 seconds, and repeat 3 times. Do the exercises with one or both arms as instructed by your health care provider. ?For these exercises, sit at a table with your hand and arm supported by the table. A chair  that slides easily or has wheels can be helpful. ?External rotation ?Turn your chair so that your affected side is nearest to the table. ?Place your forearm on the table to your side. Bend your elbow about 90? at the elbow (right angle) and place your hand palm facing down on the table. Your elbow should be about 6 inches away from your side. ?Keeping your arm on the table, lean your body forward. ?Abduction ?Turn your chair so that your affected side is nearest to the table. ?Place your forearm and hand on the table so that your thumb points toward the ceiling and your arm is straight out to your side. ?Slide your hand out to the side and away from you, using your unaffected arm to do the work. ?To increase the stretch, you can slide your chair away from the table. ?Flexion: forward stretch ?Sit facing the table. Place your hand and elbow on the table in front of you. ?Slide your hand forward and away from you, using your unaffected arm to do the work. ?To increase the stretch, you can slide your chair backward. ?Phase 3 exercises ?Do these exercises 1-2 times per day or as told by your health care provider. Hold each stretch for 30 seconds, and repeat 3 times. Do the exercises with one or both arms as instructed by your health care provider. ?Cross-body stretch: posterior capsule stretch ?Lift your arm straight out in front of you. ?Bend your arm 90? at the  elbow (right angle) so your forearm moves across your body. ?Use your other arm to gently pull the elbow across your body, toward your other shoulder. ?Wall climbs ?Stand with your affected arm extended out to the side with your hand resting on a door frame. ?Slide your hand slowly up the door frame. ?To increase the stretch, step through the door frame. Keep your body upright and do not lean. ?Wand exercises ?You will need a cane, a piece of PVC pipe, or a sturdy wooden dowel for wand exercises. ?Flexion ?To do this exercise while standing: ?Hold the wand with  both of your hands, palms down. ?Using the other arm to help, lift your arms up and over your head, if able. ?Push upward with your other arm to gently increase the stretch. ?To do this exercise while lying down: ?Lie on your back with your elbows resting on the floor and the wand in both your hands. Your hands will be palm down, or pointing toward your feet. ?Lift your hands toward the ceiling, using your unaffected arm to help if needed. ?Bring your arms overhead as able, using your unaffected arm to help if needed. ?Internal rotation ?Stand while holding the wand behind you with both hands. Your unaffected arm should be extended above your head with the arm of the affected side extended behind you at the level of your waist. The wand should be pointing straight up and down as you hold it. ?Slowly pull the wand up behind your back by straightening the elbow of your unaffected arm and bending the elbow of your affected arm. ?External rotation ?Lie on your back with your affected upper arm supported on a small pillow or rolled towel. When you first do this exercise, keep your upper arm close to your body. Over time, bring your arm up to a 90? angle out to the side. ?Hold the wand across your stomach and with both hands palm up. Your elbow on your affected side should be bent at a 90? angle. ?Use your unaffected side to help push your forearm away from you and toward the floor. Keep your elbow on your affected side bent at a 90? angle. ?Contact a health care provider if you have: ?New or increasing pain. ?New numbness, tingling, weakness, or discoloration in your arm or hand. ?This information is not intended to replace advice given to you by your health care provider. Make sure you discuss any questions you have with your health care provider. ?Document Revised: 05/01/2017 Document Reviewed: 05/01/2017 ?Elsevier Patient Education ? 2022 [employer] Inc[employer] ? ?

## 2021-07-04 NOTE — Progress Notes (Signed)
?  Date of Visit: 07/04/2021  ? ?SUBJECTIVE:  ? ?HPI: ? ?Laura Griffin presents today for routine follow up. ? ?Hypertension - taking amlodipine 10mg  daily, losartan 50mg  daily. Tolerating well. ? ?Incontinence - noticing increased incontinence when she coughs. Previously was unable to afford myrbetriq but is interested in trying this again in case insurance covers it. ? ?Hearing issue - saw audiology at [facility] some time in last year. Has not gotten clear guidance from them on whether she should get hearing aids. Is interested potentially in over the counter hearing aids. Would like referral to see new audiologist. ? ?Neck discomfort - has pain in posterior aspect of neck/shoulder on R side. Pain is in muscles there, worse with some movements. ? ?OBJECTIVE:  ? ?BP 130/80   Pulse (!) 51   Temp 98.1 ?F (36.7 ?C)   Wt 172 lb 9.6 oz (78.3 kg)   LMP 04/02/1996   SpO2 97%   BMI 31.07 kg/m?  ?Gen: no acute distress, pleasant cooperative ?HEENT: normocephalic, atraumatic. Full ROM of neck. Tenderness over trapezius on R side ?Heart: regular rate and rhythm, no murmur ?Lungs: clear to auscultation bilaterally, normal work of breathing  ?Neuro: alert, speech normal, grossly nonfocal ? ?ASSESSMENT/PLAN:  ? ?Health maintenance:  ?-UTD on HM items ? ?HYPERTENSION, BENIGN SYSTEMIC ?Well controlled. Continue current medication regimen.  ? ?Stress incontinence ?Worsened lately. Will try myrbetriq - rx sent in. Patient will let me know if it is not affordable. ? ?Hearing loss ?Refer to new audiologist by patient preference ? ? ? J. 05/31/1996, MD ?[facility] ?

## 2021-07-11 DIAGNOSIS — N393 Stress incontinence (female) (male): Secondary | ICD-10-CM | POA: Insufficient documentation

## 2021-07-11 NOTE — Assessment & Plan Note (Signed)
Worsened lately. Will try myrbetriq - rx sent in. Patient will let me know if it is not affordable. ?

## 2021-07-11 NOTE — Assessment & Plan Note (Signed)
Well controlled. Continue current medication regimen.  

## 2021-07-11 NOTE — Assessment & Plan Note (Signed)
Refer to new audiologist by patient preference ?

## 2021-07-20 ENCOUNTER — Ambulatory Visit: Payer: Medicare Other | Attending: Audiologist | Admitting: Audiologist

## 2021-07-20 DIAGNOSIS — H903 Sensorineural hearing loss, bilateral: Secondary | ICD-10-CM | POA: Diagnosis not present

## 2021-07-20 NOTE — Procedures (Signed)
?  [facility] ?[address] ?[place], [place]  [id number] ?[phone] ? ?AUDIOLOGICAL  EVALUATION ? ?NAME: Laura Griffin     ?DOB:   1946/02/23      ?MRN: [id number]                                                                                     ?DATE: 07/20/2021     ?REFERENT: Leeanne Rio, MD ?STATUS: Outpatient ?DIAGNOSIS: Sensorineural Hearing Loss Bilateral   ? ?History: ?Griffin was seen for an audiological evaluation due to difficulty hearing. [name] says she has had many hearing tests over the years but hearing aids have never been recommended. [name] is having severe difficulty hearing on a daily basis. She went to [facility] for hearing aids and they told her to see an audiologist. [name] is ready for hearing aids but would like a less expensive pair. She had roaring tinnitus like a train in both ears. She denies any pain, pressure, or fullness. She is having difficulty hearing in meetings and hearing her grandchildren. She has to see the person to understand them.  ? ?Medical review found previous audiologic testing. Previous audiograms show asymmetric hearing loss with right ear worse [name] was referred to Otolaryngology. ENT recommended MRI or monitoring for four months. [name] elected to just monitor hearing. The next evaluation showed less of an asymmetry with thresholds in the right ear improving.  ? ?Evaluation:  ?Otoscopy showed a clear view of the tympanic membranes, bilaterally ?Tympanometry results were consistent with normal middle ear function, bilaterally  ?Audiometric testing was completed using Conventional Audiometry techniques with insert earphones and supraural headphones. Test results are consistent with moderate relatively flat sensorineural hearing loss in each ear. No significant asymmetry present. Speech Recognition Thresholds were obtained at 60 dB HL in the right ear and at 45  dB HL in the left ear. Word Recognition Testing was  completed at 95 dB HL and [name] scored 92% correct in the right ear and 88% in the left ear.   ? ?Results:  ?The test results were reviewed with [name]. She is still a good candidate for hearing aids. She will need a powerful device. We recommend she see a provider that verifies the fit of hearing aid. She is not a candidate for over the counter. A copy of today's results and a list of hearing aid providers in the area were given to her to take.  ? ? ?Recommendations: ?1.   Trial amplification as recommended. A list of hearing aid providers in the area were provided ? ?  ?If you have any questions please feel free to contact me at [phone]. ? ?Lorenza Evangelist, [place].  ?Audiology Intern ? ?Alfonse Alpers ?Audiologist, Au.D., CCC-A ?07/20/2021  1:16 PM ? ?Cc: Leeanne Rio, MD ? ?

## 2021-08-04 DIAGNOSIS — H401133 Primary open-angle glaucoma, bilateral, severe stage: Secondary | ICD-10-CM | POA: Diagnosis not present

## 2021-08-04 DIAGNOSIS — H5010 Unspecified exotropia: Secondary | ICD-10-CM | POA: Diagnosis not present

## 2021-08-04 DIAGNOSIS — I10 Essential (primary) hypertension: Secondary | ICD-10-CM | POA: Diagnosis not present

## 2021-08-29 ENCOUNTER — Other Ambulatory Visit: Payer: 59

## 2021-09-07 ENCOUNTER — Other Ambulatory Visit: Payer: Self-pay | Admitting: Family Medicine

## 2021-09-11 NOTE — Telephone Encounter (Signed)
Patient calls nurse line in regards to Gabapentin prescription.   Will forward to PCP.

## 2021-09-19 DIAGNOSIS — Z1231 Encounter for screening mammogram for malignant neoplasm of breast: Secondary | ICD-10-CM | POA: Diagnosis not present

## 2021-10-05 ENCOUNTER — Encounter: Payer: Self-pay | Admitting: Family Medicine

## 2021-10-09 ENCOUNTER — Other Ambulatory Visit: Payer: Self-pay | Admitting: Family Medicine

## 2021-11-06 ENCOUNTER — Encounter: Payer: Self-pay | Admitting: Family Medicine

## 2021-11-06 ENCOUNTER — Ambulatory Visit (INDEPENDENT_AMBULATORY_CARE_PROVIDER_SITE_OTHER): Payer: Medicare Other | Admitting: Family Medicine

## 2021-11-06 VITALS — BP 141/78 | HR 45 | Ht 62.5 in | Wt 174.4 lb

## 2021-11-06 DIAGNOSIS — R519 Headache, unspecified: Secondary | ICD-10-CM

## 2021-11-06 DIAGNOSIS — R42 Dizziness and giddiness: Secondary | ICD-10-CM

## 2021-11-06 DIAGNOSIS — I951 Orthostatic hypotension: Secondary | ICD-10-CM

## 2021-11-06 DIAGNOSIS — M542 Cervicalgia: Secondary | ICD-10-CM

## 2021-11-06 DIAGNOSIS — N393 Stress incontinence (female) (male): Secondary | ICD-10-CM

## 2021-11-06 NOTE — Assessment & Plan Note (Addendum)
Improved on Myrbetriq 25 mg daily. Unclear if myrbetriq is to blame for lightheadedness she is feeling. Discussed options with patient and we decided on the following: - trial off myrbetriq for a few days, see if dizziness improves - if improved, remain off myrbetriq - if not improved, restart at 25mg  for a few days, then go up to 50mg  daily for more benefit

## 2021-11-06 NOTE — Assessment & Plan Note (Signed)
Etiology unclear.  Normal neurological exam.  Given her age and unilateral headache, will check sed rate to ensure not temporal arteritis, though low suspicion clinically.  We will monitor this and see if it persists.  If worsening or unrelenting, consider imaging with MRI.

## 2021-11-06 NOTE — Assessment & Plan Note (Addendum)
Suspect slight muscle spasm of neck with rapid movements of head.  Recommend stretching. Intended to give handout on exercises but did not give to patient prior to leaving, will mail to her.  The itchy area she points to on the back of her neck has no visible skin lesions or palpable skin lesions.  Recommend trial of low-dose hydrocortisone to see if this gets her any improvement.

## 2021-11-06 NOTE — Progress Notes (Signed)
Date of Visit: 11/06/2021   SUBJECTIVE:   HPI:  Laura Griffin presents today for routine follow-up, also to discuss several issues.  Incontinence: Taking Myrbetriq 25 mg daily.  Has noticed significant improvement on this medication.  Has decreased urinary frequency.  Still has some urgency, but this is much improved and she is wetting herself much less often.  Hypertension: Taking amlodipine 10 mg daily, losartan 50 mg daily, and metoprolol succinate 50 mg daily.  She does note feeling dizziness/lightheadedness when she stands up quickly in the morning.  Has not actually fallen but has felt like she was falling sometimes. On further questioning it seems the dizziness began after starting myrbetriq.  Neck pain: Having some discomfort in the left back of her neck, begins when she turns her head and it is like her neck "catches".  Happens occasionally.  Also notes new onset of headache for the last few weeks.  Has headache just above her left eye.  It is not constant, happens about 3-4 times a week, lasting for 30 minutes at a time.  Denies any vision or speech changes.  Denies weakness.  Does not take any medication for this and it spontaneously resolves.  She takes gabapentin 800 mg 3 times a day chronic pain.  Also takes Tylenol regularly.  OBJECTIVE:   BP (!) 141/78   Pulse (!) 45   Wt 174 lb 6.4 oz (79.1 kg)   LMP 04/02/1996   SpO2 100%   BMI 31.39 kg/m  Gen: No acute distress, pleasant, cooperative HEENT: Normocephalic, atraumatic.  Full active range of motion of neck. No tenderness of temporal area or jaw on L. Heart: Regular rate and rhythm, no murmurs Lungs: Clear to auscultation bilaterally, normal effort Neuro: Alert.  Speech normal.  Tongue protrudes midline.  Shoulder shrug 5 out of 5 bilaterally.  Sensation intact over bilateral face, arms, and legs.  Pupils equal round and reactive to light.  Extraocular movements intact.  Strength in bilateral upper and lower extremities  normal. Skin: No skin lesions are visible or palpable on her posterior neck in the area she identifies as being itchy.  ASSESSMENT/PLAN:   Health maintenance:  -Return for flu shot when available  Stress incontinence Improved on Myrbetriq 25 mg daily. Unclear if myrbetriq is to blame for lightheadedness she is feeling. Discussed options with patient and we decided on the following: - trial off myrbetriq for a few days, see if dizziness improves - if improved, remain off myrbetriq - if not improved, restart at 25mg  for a few days, then go up to 50mg  daily for more benefit   Neck pain Suspect slight muscle spasm of neck with rapid movements of head.  Recommend stretching. Intended to give handout on exercises but did not give to patient prior to leaving, will mail to her.  The itchy area she points to on the back of her neck has no visible skin lesions or palpable skin lesions.  Recommend trial of low-dose hydrocortisone to see if this gets her any improvement.  Headache Etiology unclear.  Normal neurological exam.  Given her age and unilateral headache, will check sed rate to ensure not temporal arteritis, though low suspicion clinically.  We will monitor this and see if it persists.  If worsening or unrelenting, consider imaging with MRI.  Orthostatic hypotension Symptoms of lightheadedness with sitting up and standing suggestive of orthostasis Orthostatics positive today (though notably was not hypotensive on those vitals). Discussed that gabapentin, antihypertensives may be contributing, though symptoms did  not begin until she started myrbetriq After shared decision making discussion, elected to trial different doses of myrbetriq to see if she has improvement. Will not adjust other medications at this time Follow up if not improving or if worsening.  Completed handicap placard form today (walks with cane)  FOLLOW UP: Follow up in 3 mos for above issues  [place] J. Pollie Meyer,  MD [facility]

## 2021-11-06 NOTE — Patient Instructions (Addendum)
It was great to see you again today!  Try going without myrbetriq for a few days to see if dizziness feels better. If it makes no difference, go back on it and after several days can try increase myrbetriq to 50mg  daily. Hopefully this will help even more with urine  Get up very slowly in the morning to prevent lightheadedness and falls.  Checking inflammatory lab (sedimentation rate). This will be reassuring if it is normal.  Come back if headache or dizziness worsening or not improving - could consider getting imaging at that time (MRI or CT scan).  Be well, Dr. 

## 2021-11-06 NOTE — Assessment & Plan Note (Signed)
Symptoms of lightheadedness with sitting up and standing suggestive of orthostasis Orthostatics positive today (though notably was not hypotensive on those vitals). Discussed that gabapentin, antihypertensives may be contributing, though symptoms did not begin until she started myrbetriq After shared decision making discussion, elected to trial different doses of myrbetriq to see if she has improvement. Will not adjust other medications at this time Follow up if not improving or if worsening.

## 2021-11-07 ENCOUNTER — Encounter: Payer: Self-pay | Admitting: Family Medicine

## 2021-11-07 LAB — CBC
Hematocrit: 34 % (ref 34.0–46.6)
Hemoglobin: 11.2 g/dL (ref 11.1–15.9)
MCH: 30.5 pg (ref 26.6–33.0)
MCHC: 32.9 g/dL (ref 31.5–35.7)
MCV: 93 fL (ref 79–97)
Platelets: 266 10*3/uL (ref 150–450)
RBC: 3.67 x10E6/uL — ABNORMAL LOW (ref 3.77–5.28)
RDW: 12.9 % (ref 11.7–15.4)
WBC: 3.9 10*3/uL (ref 3.4–10.8)

## 2021-11-07 LAB — BASIC METABOLIC PANEL
BUN/Creatinine Ratio: 13 (ref 12–28)
BUN: 14 mg/dL (ref 8–27)
CO2: 26 mmol/L (ref 20–29)
Calcium: 10.4 mg/dL — ABNORMAL HIGH (ref 8.7–10.3)
Chloride: 103 mmol/L (ref 96–106)
Creatinine, Ser: 1.06 mg/dL — ABNORMAL HIGH (ref 0.57–1.00)
Glucose: 81 mg/dL (ref 70–99)
Potassium: 3.8 mmol/L (ref 3.5–5.2)
Sodium: 141 mmol/L (ref 134–144)
eGFR: 54 mL/min/{1.73_m2} — ABNORMAL LOW (ref >59)

## 2021-11-07 LAB — SEDIMENTATION RATE: Sed Rate: 12 mm/hr (ref 0–40)

## 2022-01-04 ENCOUNTER — Encounter: Payer: Self-pay | Admitting: Family Medicine

## 2022-01-04 ENCOUNTER — Ambulatory Visit (HOSPITAL_BASED_OUTPATIENT_CLINIC_OR_DEPARTMENT_OTHER): Payer: Medicare Other | Admitting: Family Medicine

## 2022-01-04 ENCOUNTER — Ambulatory Visit: Payer: 59 | Admitting: Family Medicine

## 2022-01-04 VITALS — BP 135/74 | HR 61 | Ht 62.0 in | Wt 176.5 lb

## 2022-01-04 DIAGNOSIS — M542 Cervicalgia: Secondary | ICD-10-CM | POA: Diagnosis not present

## 2022-01-04 DIAGNOSIS — N393 Stress incontinence (female) (male): Secondary | ICD-10-CM

## 2022-01-04 DIAGNOSIS — Z23 Encounter for immunization: Secondary | ICD-10-CM

## 2022-01-04 DIAGNOSIS — I10 Essential (primary) hypertension: Secondary | ICD-10-CM

## 2022-01-04 MED ORDER — BACLOFEN 10 MG PO TABS: 5.0000 mg | ORAL_TABLET | Freq: Two times a day (BID) | ORAL | 0 refills | Status: DC | PRN

## 2022-01-04 NOTE — Progress Notes (Signed)
Date of Visit: 01/04/2022   SUBJECTIVE:   HPI:  Laura Griffin presents today for follow-up.  Lightheadedness: Last time I saw her she was having some lightheadedness.  We decided to stop her Myrbetriq to see if it helped, and she reports symptoms completely resolved after stopping that.  Her incontinence has worsened from being off the Myrbetriq, but she is happier not being lightheaded.  Reports headache also improved after stopping it.  Right neck pain: Continues to have pain on the right side of her neck.  No preceding injury.  It feels crampy and like her neck gets stuck when she turns her head certain ways.  She does take gabapentin chronically for pinched nerve.  She takes 800 mg 3 times a day.  Left leg pain: For the last 4 to 5 months has had pain in her left leg especially at night.  It is a shocking, electrical type pain.  Lasts about 30 minutes at a time.  Does endorse some low back discomfort.  She does sleep on her left side.  Hypertension: Currently taking amlodipine 10 mg daily and losartan 50 mg daily.  Also takes metoprolol succinate 50 mg daily.  Blood pressures at home tend to run in the 130s over 70s.  She did not yet take her blood pressure medication today.  OBJECTIVE:   BP 135/74 Comment: home readings  Pulse 61   Ht 5\' 2"  (1.575 m)   Wt 176 lb 8 oz (80.1 kg)   LMP 04/02/1996   SpO2 97%   BMI 32.28 kg/m  Gen: No acute distress, pleasant, cooperative HEENT: Normocephalic, atraumatic.  Full range of motion of neck actively.  Tightness and tenderness to palpation of right trapezius muscle between right neck and shoulder.  Full range of motion of bilateral arms/shoulders. Heart: Regular rate and rhythm, no murmur Lungs: Clear to auscultation bilaterally, normal effort Neuro: Alert, grossly nonfocal, speech normal.  Grip strength 5 out of 5 bilaterally. Ext: Left leg tender to light palpation.  No warmth or erythema appreciated.  No swelling.  Negative Homans' sign.  Full  strength with bilateral hip abduction, abduction, and flexion.  Knee extension and flexion 5 out of 5 bilaterally.  ASSESSMENT/PLAN:   Health maintenance:  -Flu vaccine given today  HYPERTENSION, BENIGN SYSTEMIC Blood pressure elevated today but did not take medications.  Her home readings are well controlled.  We will continue her current regimen.  Stress incontinence Unfortunately symptoms have returned after stopping Myrbetriq, but she was not able to tolerate the side effects.  She will continue monitoring symptoms for now.  Neck pain Exam notable for tenderness and some spasm of right trapezius muscle.  Reviewed etiology with patient today.  Discussed supportive measures including heating pad, neck exercises.  We will also do trial of low-dose baclofen to see if this helps.  Counseled on risk of sedation with this medication.  Left leg tingling Has had history of this on the right side per prior documentation.  Suspect this is nerve irritation coming from her back.  No red flags.  No signs of DVT on exam.  Advised that it is possible the baclofen will help her symptoms.  If that does not help, I have told her she can go up to 1200 mg of gabapentin at night.  Her symptoms are mainly at night, so hopefully this will help.  She will let me know if she goes up on this dose and wants to stay on it so I can send more  in for her.  FOLLOW UP: Follow up in 3 months for above issues.  [place]. Ardelia Mems, [place]

## 2022-01-04 NOTE — Patient Instructions (Addendum)
It was great to see you again today!  Take baclofen 5mg  (half of a pill) twice daily as needed for neck pain. Try heating pad See handout below with exercises  Ok to try 3 pills of gabapentin at bedtime Don't start this while using baclofen Let me know if you keep up with the 3 pills of gabapentin so I can send in more for you.  Come back in 3 months   Neck Exercises Ask your health care provider which exercises are safe for you. Do exercises exactly as told by your health care provider and adjust them as directed. It is normal to feel mild stretching, pulling, tightness, or discomfort as you do these exercises. Stop right away if you feel sudden pain or your pain gets worse. Do not begin these exercises until told by your health care provider. Neck exercises can be important for many reasons. They can improve strength and maintain flexibility in your neck, which will help your upper back and prevent neck pain. Stretching exercises Rotation neck stretching  Sit in a chair or stand up. Place your feet flat on the floor, shoulder-width apart. Slowly turn your head (rotate) to the right until a slight stretch is felt. Turn it all the way to the right so you can look over your right shoulder. Do not tilt or tip your head. Hold this position for 10-30 seconds. Slowly turn your head (rotate) to the left until a slight stretch is felt. Turn it all the way to the left so you can look over your left shoulder. Do not tilt or tip your head. Hold this position for 10-30 seconds. Repeat __________ times. Complete this exercise __________ times a day. Neck retraction  Sit in a sturdy chair or stand up. Look straight ahead. Do not bend your neck. Use your fingers to push your chin backward (retraction). Do not bend your neck for this movement. Continue to face straight ahead. If you are doing the exercise properly, you will feel a slight sensation in your throat and a stretch at the back of your  neck. Hold the stretch for 1-2 seconds. Repeat __________ times. Complete this exercise __________ times a day. Strengthening exercises Neck press  Lie on your back on a firm bed or on the floor with a pillow under your head. Use your neck muscles to push your head down on the pillow and straighten your spine. Hold the position as well as you can. Keep your head facing up (in a neutral position) and your chin tucked. Slowly count to 5 while holding this position. Repeat __________ times. Complete this exercise __________ times a day. Isometrics These are exercises in which you strengthen the muscles in your neck while keeping your neck still (isometrics). Sit in a supportive chair and place your hand on your forehead. Keep your head and face facing straight ahead. Do not flex or extend your neck while doing isometrics. Push forward with your head and neck while pushing back with your hand. Hold for 10 seconds. Do the sequence again, this time putting your hand against the back of your head. Use your head and neck to push backward against the hand pressure. Finally, do the same exercise on either side of your head, pushing sideways against the pressure of your hand. Repeat __________ times. Complete this exercise __________ times a day. Prone head lifts  Lie face-down (prone position), resting on your elbows so that your chest and upper back are raised. Start with your head facing  downward, near your chest. Position your chin either on or near your chest. Slowly lift your head upward. Lift until you are looking straight ahead. Then continue lifting your head as far back as you can comfortably stretch. Hold your head up for 5 seconds. Then slowly lower it to your starting position. Repeat __________ times. Complete this exercise __________ times a day. Supine head lifts  Lie on your back (supine position), bending your knees to point to the ceiling and keeping your feet flat on the  floor. Lift your head slowly off the floor, raising your chin toward your chest. Hold for 5 seconds. Repeat __________ times. Complete this exercise __________ times a day. Scapular retraction  Stand with your arms at your sides. Look straight ahead. Slowly pull both shoulders (scapulae) backward and downward (retraction) until you feel a stretch between your shoulder blades in your upper back. Hold for 10-30 seconds. Relax and repeat. Repeat __________ times. Complete this exercise __________ times a day. Contact a health care provider if: Your neck pain or discomfort gets worse when you do an exercise. Your neck pain or discomfort does not improve within 2 hours after you exercise. If you have any of these problems, stop exercising right away. Do not do the exercises again unless your health care provider says that you can. Get help right away if: You develop sudden, severe neck pain. If this happens, stop exercising right away. Do not do the exercises again unless your health care provider says that you can. This information is not intended to replace advice given to you by your health care provider. Make sure you discuss any questions you have with your health care provider. Document Revised: 09/13/2020 Document Reviewed: 09/13/2020 Elsevier Patient Education  [place] well, Dr. Ardelia Mems

## 2022-01-05 NOTE — Assessment & Plan Note (Signed)
Blood pressure elevated today but did not take medications.  Her home readings are well controlled.  We will continue her current regimen.

## 2022-01-05 NOTE — Assessment & Plan Note (Signed)
Exam notable for tenderness and some spasm of right trapezius muscle.  Reviewed etiology with patient today.  Discussed supportive measures including heating pad, neck exercises.  We will also do trial of low-dose baclofen to see if this helps.  Counseled on risk of sedation with this medication.

## 2022-01-05 NOTE — Assessment & Plan Note (Signed)
Unfortunately symptoms have returned after stopping Myrbetriq, but she was not able to tolerate the side effects.  She will continue monitoring symptoms for now.

## 2022-01-29 ENCOUNTER — Other Ambulatory Visit: Payer: Self-pay | Admitting: Family Medicine

## 2022-01-30 DIAGNOSIS — Z23 Encounter for immunization: Secondary | ICD-10-CM | POA: Diagnosis not present

## 2022-02-06 ENCOUNTER — Ambulatory Visit: Payer: Medicare Other | Admitting: Obstetrics and Gynecology

## 2022-02-06 NOTE — Progress Notes (Deleted)
76 y.o. G1P1 Married [place] American female here for annual exam.    PCP:   Dr. Pollie Meyer  [name] last menstrual period was 04/02/1996.           Sexually active: {yes no:314532}  The current method of family planning is tubal ligation.    Exercising: {yes no:314532}  {types:19826} Smoker:  {YES [id number]  Health Maintenance: Pap:  12/13/15 Neg; 12/24/18 Neg, HPV neg History of abnormal Pap:  {YES NO:22349} MMG:  09/19/21, BiRads 2, benign Colonoscopy:  06/09/14 BMD:   05/03/21  Result  Normal TDaP:  09/02/17 Gardasil:   {YES NO:22349} HIV: Hep C: Screening Labs:  Hb today: ***, Urine today: ***   reports that she quit smoking about 27 years ago. Her smoking use included cigarettes. She has a 7.50 pack-year smoking history. She has never used smokeless tobacco. She reports that she does not drink alcohol and does not use drugs.  Past Medical History:  Diagnosis Date   Allergic rhinitis 07/19/2014   Arthritis of knee, right 03/11/2006   Bursitis of shoulder, right 09/19/1999   Cervical radiculopathy at C6 05/24/2011   Beginning Nov 2012. Multi-level foraminal narrowing in C-spine plain films    Diverticulosis    DIVERTICULOSIS OF COLON 05/30/2006   Colonoscopy 06/2014 - multiple diverticula, no other abnormalities, no further colonoscopies recommended given patient's age of 41     GERD (gastroesophageal reflux disease) 07/19/2014   Diagnosed clinically by [facility] in January of 2016.     Glaucoma    Hearing decreased 08/07/2011   With whooshing tinnitus Mild bilateral loss on 08/2012 audiology who will recheck her hearing in one year     History of herniated intervertebral disc    Hypertension    HYPERTENSION, BENIGN SYSTEMIC 05/30/2006        Left sided sciatica 8/95,11/00   CT confirmed    LOW BACK PAIN SYNDROME 05/10/2009   Qualifier: Diagnosis of  By: Lorenda Hatchet CMA,, Thekla  Mild C 3-4, 4-5 lumbar spinal stenosis MRI 08/2011, Diffuse lower thoracic and lumbar DDD    Mitral  valve prolapse    per patient heart murmur   Orthostatic hypotension 06/12/2013   Osteoarthritis    Osteoarthritis of right knee 12/26/2012   Confirmed on xray 07/2014    OSTEOARTHRITIS, MULTI SITES 05/30/2006   She is stoic about pain and wants to avoid surgeries Past xrays have documented DJD in right knee and lumbar spine     Overweight (BMI 25.0-29.9) 05/30/2006   Pitting edema 07/19/2014   Bilateral 1+ pitting edema of calves first noted on 07/19/2014.     Sciatica 05/30/2006   Qualifier: Diagnosis of  By: Sheffield Slider MD, Wayne  Involved left leg in 02/1999    Swallowing difficulty 07/06/2013   Urge incontinence 06/01/2008   Qualifier: Diagnosis of  By: Sheffield Slider MD, Deniece Portela      Past Surgical History:  Procedure Laterality Date   CATARACT EXTRACTION  02/2011   left eye   TUBAL LIGATION  1973   VULVA /PERINEUM BIOPSY  2010   for hypopigmentation, non-specific result    Current Outpatient Medications  Medication Sig Dispense Refill   acetaminophen (TYLENOL) 650 MG CR tablet Take 2 tablets (1,300 mg total) by mouth every 8 (eight) hours as needed. (Patient taking differently: Take 1,300 mg by mouth 3 (three) times daily.) 90 tablet 1   amLODipine (NORVASC) 10 MG tablet TAKE 1 TABLET(10 MG) BY MOUTH AT BEDTIME 90 tablet 3   aspirin EC 81 MG  tablet Take 81 mg by mouth daily.     baclofen (LIORESAL) 10 MG tablet Take 0.5 tablets (5 mg total) by mouth 2 (two) times daily as needed for muscle spasms. 30 each 0   brimonidine-timolol (COMBIGAN) 0.2-0.5 % ophthalmic solution Place 2 drops into both eyes every 12 (twelve) hours.     Cholecalciferol 25 MCG (1000 UT) capsule Take 2,000 Units by mouth daily.     gabapentin (NEURONTIN) 400 MG capsule TAKE 2 CAPSULES(800 MG) BY MOUTH THREE TIMES DAILY 540 capsule 1   latanoprost (XALATAN) 0.005 % ophthalmic solution PLACE 1 DROP INTO BOTH EYES EVERY EVENING/BEDTIME     losartan (COZAAR) 50 MG tablet TAKE 1 TABLET BY MOUTH DAILY FOR BLOOD PRESSURE 90 tablet 1    metoprolol succinate (TOPROL-XL) 50 MG 24 hr tablet TAKE 1 TABLET(50 MG) BY MOUTH AT BEDTIME WITH OR IMMEDIATELY FOLLOWING A MEAL 90 tablet 3   Omega-3 Fatty Acids (FISH OIL PO) Take by mouth.     No current facility-administered medications for this visit.    Family History  Problem Relation Age of Onset   Stroke Mother 34       cerebral hemorrhage   Hypertension Mother    Hypertension Father    Heart disease Father    Breast cancer Sister        7   Breast cancer Sister 66   Breast cancer Maternal Aunt     Review of Systems  Exam:   LMP 04/02/1996     General appearance: alert, cooperative and appears stated age Head: normocephalic, without obvious abnormality, atraumatic Neck: no adenopathy, supple, symmetrical, trachea midline and thyroid normal to inspection and palpation Lungs: clear to auscultation bilaterally Breasts: normal appearance, no masses or tenderness, No nipple retraction or dimpling, No nipple discharge or bleeding, No axillary adenopathy Heart: regular rate and rhythm Abdomen: soft, non-tender; no masses, no organomegaly Extremities: extremities normal, atraumatic, no cyanosis or edema Skin: skin color, texture, turgor normal. No rashes or lesions Lymph nodes: cervical, supraclavicular, and axillary nodes normal. Neurologic: grossly normal  Pelvic: External genitalia:  no lesions              No abnormal inguinal nodes palpated.              Urethra:  normal appearing urethra with no masses, tenderness or lesions              Bartholins and Skenes: normal                 Vagina: normal appearing vagina with normal color and discharge, no lesions              Cervix: no lesions              Pap taken: {yes no:314532} Bimanual Exam:  Uterus:  normal size, contour, position, consistency, mobility, non-tender              Adnexa: no mass, fullness, tenderness              Rectal exam: {yes no:314532}.  Confirms.              Anus:  normal sphincter  tone, no lesions  Chaperone was present for exam:  ***  Assessment:   Well woman visit with gynecologic exam.   Plan: Mammogram screening discussed. Self breast awareness reviewed. Pap and HR HPV as above. Guidelines for Calcium, Vitamin D, regular exercise program including cardiovascular and weight bearing exercise.  Follow up annually and prn.   Additional counseling given.  {yes [id number]. _______ minutes face to face time of which over 50% was spent in counseling.    After visit summary provided.

## 2022-03-19 ENCOUNTER — Other Ambulatory Visit: Payer: Self-pay | Admitting: Family Medicine

## 2022-04-09 ENCOUNTER — Ambulatory Visit (INDEPENDENT_AMBULATORY_CARE_PROVIDER_SITE_OTHER): Payer: Medicare Other

## 2022-04-09 ENCOUNTER — Ambulatory Visit (INDEPENDENT_AMBULATORY_CARE_PROVIDER_SITE_OTHER): Payer: Medicare Other | Admitting: Orthopedic Surgery

## 2022-04-09 ENCOUNTER — Encounter: Payer: Self-pay | Admitting: Orthopedic Surgery

## 2022-04-09 ENCOUNTER — Ambulatory Visit: Payer: Self-pay

## 2022-04-09 DIAGNOSIS — M25512 Pain in left shoulder: Secondary | ICD-10-CM

## 2022-04-09 DIAGNOSIS — M5412 Radiculopathy, cervical region: Secondary | ICD-10-CM | POA: Diagnosis not present

## 2022-04-09 DIAGNOSIS — M25511 Pain in right shoulder: Secondary | ICD-10-CM

## 2022-04-09 MED ORDER — DIAZEPAM 5 MG PO TABS: ORAL_TABLET | ORAL | 0 refills | Status: DC

## 2022-04-09 MED ORDER — PREDNISONE 5 MG (21) PO TBPK: ORAL_TABLET | ORAL | 0 refills | Status: DC

## 2022-04-09 NOTE — Progress Notes (Signed)
Office Visit Note   Patient: Laura Griffin           Date of Birth: [birth year]           MRN: [id number] Visit Date: 04/09/2022 Requested by: Latrelle Dodrill, MD [address] [place],  [place] [address] PCP: Latrelle Dodrill, MD  Subjective: Chief Complaint  Patient presents with   Other    Neck and bilateral shoulder pain    HPI: Laura Griffin is a 77 y.o. female who presents to the office reporting bilateral shoulder pain and neck pain.  Neck pain has been present for 3 to 4 months but acute over the last 6 weeks.  She also describes bilateral shoulder pain right worse than left with some tingling in the fingers which is worse on the right-hand side.  She is right-hand dominant.  Pain wakes her from sleep most nights.  Tylenol arthritis has been planned without much relief.  Ambulates with a cane in her left hand.  She states her knees are doing very well from prior injection.  She states that this pain "frightens her more than it hurts" she states that her neck catches..  She did have an MRI of her lumbar spine in 2013 which showed multiple bulging disc at multiple levels.              ROS: All systems reviewed are negative as they relate to the chief complaint within the history of present illness.  Patient denies fevers or chills.  Assessment & Plan: Visit Diagnoses:  1. Bilateral shoulder pain, unspecified chronicity   2. Radiculopathy, cervical region     Plan: Impression is neck pain and bilateral shoulder pain which appears to be primarily radicular.  Based on the amount of bulging disc she had in her lumbar spine a decade ago I think it is highly likely she has similar problem in her cervical spine.  Her shoulder exam and radiographs are underwhelming.  Plan is open MRI of her cervical spine due to duration of symptoms as well as failure of conservative treatment including over-the-counter medicine Joslyn Devon activity modification and an attempt at range  of motion exercises on her own at home which have not been successful and limbering up the neck.  6-day Medrol Dosepak also prescribed.  Follow-up after the MRI scan on her neck and we will consider epidural steroid injections at that time.  No signs or symptoms of myelopathy today but that would also be a consideration if the stenosis in her cervical spine is severe.  Follow-Up Instructions: No follow-ups on file.   Orders:  Orders Placed This Encounter  Procedures   XR Shoulder Right   XR Shoulder Left   XR Cervical Spine 2 or 3 views   MR Cervical Spine w/o contrast   Meds ordered this encounter  Medications   diazepam (VALIUM) 5 MG tablet    Sig: Take 1 tablet 30 minutes prior to procedure, repeat if needed    Dispense:  3 tablet    Refill:  0   predniSONE (STERAPRED UNI-PAK 21 TAB) 5 MG (21) TBPK tablet    Sig: Take dosepak as directed    Dispense:  21 tablet    Refill:  0      Procedures: No procedures performed   Clinical Data: No additional findings.  Objective: Vital Signs: LMP 04/02/1996   Physical Exam:  Constitutional: Patient appears well-developed HEENT:  Head: Normocephalic Eyes:EOM are normal Neck: Normal range of  motion Cardiovascular: Normal rate Pulmonary/chest: Effort normal Neurologic: Patient is alert Skin: Skin is warm Psychiatric: Patient has normal mood and affect  Ortho Exam: Ortho exam demonstrates negative Hoffmann's test.  She does walk with a cane.  Has pretty reasonable hip flexion strength bilaterally.  In her upper extremity she has 5 out of 5 grip EPL FPL interosseous resection extension bicep triceps and deltoid strength.  Reflexes 0 to 1+ out of 4 bilateral biceps and triceps.  Radial pulse intact bilaterally.  Shoulder range of motion is good bilaterally to 50/90/150.  Very good rotator cuff strength infraspinatus supraspinatus and subscap muscle testing.  Has diminished cervical spine range of motion with flexion extension as well  as rotation.  Specialty Comments:  No specialty comments available.  Imaging: No results found.   PMFS History: Patient Active Problem List   Diagnosis Date Noted   Stress incontinence 07/11/2021   Skin lesion 12/08/2020   Right-sided back pain 11/08/2020   Ganglion cyst of dorsum of right wrist 53/66/4403   Lichen sclerosus of female genitalia 09/25/2018    Class: Chronic   Hyperlipidemia 47/42/5956   Lichen planus 38/75/6433   Numbness and tingling of right leg 10/19/2016   Shoulder pain 03/01/2016   Headache 03/01/2016   Asymmetric SNHL (sensorineural hearing loss) 01/10/2016   Tinnitus of both ears 01/10/2016   Hearing loss 10/07/2015   Thyroid nodule 05/24/2015   Globus sensation 05/05/2015   Neck pain 01/07/2015   Allergic rhinitis 07/19/2014   GERD (gastroesophageal reflux disease) 07/19/2014   Pitting edema 07/19/2014   Swallowing difficulty 07/06/2013   Orthostatic hypotension 06/12/2013   Osteoarthritis of right knee 12/26/2012   GLAUCOMA 11/04/2009   DDD (degenerative disc disease), lumbar 05/10/2009   URGE INCONTINENCE 06/01/2008   Overweight (BMI 25.0-29.9) 05/30/2006   HYPERTENSION, BENIGN SYSTEMIC 05/30/2006   Osteoarthritis 05/30/2006   Past Medical History:  Diagnosis Date   Allergic rhinitis 07/19/2014   Arthritis of knee, right 03/11/2006   Bursitis of shoulder, right 09/19/1999   Cervical radiculopathy at C6 05/24/2011   Beginning Nov 2012. Multi-level foraminal narrowing in C-spine plain films    Diverticulosis    DIVERTICULOSIS OF COLON 05/30/2006   Colonoscopy 06/2014 - multiple diverticula, no other abnormalities, no further colonoscopies recommended given patient's age of 64     GERD (gastroesophageal reflux disease) 07/19/2014   Diagnosed clinically by [facility] in January of 2016.     Glaucoma    Hearing decreased 08/07/2011   With whooshing tinnitus Mild bilateral loss on 08/2012 audiology who will recheck her hearing in one year      History of herniated intervertebral disc    Hypertension    HYPERTENSION, BENIGN SYSTEMIC 05/30/2006        Left sided sciatica 8/95,11/00   CT confirmed    LOW BACK PAIN SYNDROME 05/10/2009   Qualifier: Diagnosis of  By: Javier Glazier CMA,, Thekla  Mild C 3-4, 4-5 lumbar spinal stenosis MRI 08/2011, Diffuse lower thoracic and lumbar DDD    Mitral valve prolapse    per patient heart murmur   Orthostatic hypotension 06/12/2013   Osteoarthritis    Osteoarthritis of right knee 12/26/2012   Confirmed on xray 07/2014    OSTEOARTHRITIS, MULTI SITES 05/30/2006   She is stoic about pain and wants to avoid surgeries Past xrays have documented DJD in right knee and lumbar spine     Overweight (BMI 25.0-29.9) 05/30/2006   Pitting edema 07/19/2014   Bilateral 1+ pitting edema of calves first  noted on 07/19/2014.     Sciatica 05/30/2006   Qualifier: Diagnosis of  By: Sheffield Slider MD, Wayne  Involved left leg in 02/1999    Swallowing difficulty 07/06/2013   Urge incontinence 06/01/2008   Qualifier: Diagnosis of  By: Sheffield Slider MD, Deniece Portela      Family History  Problem Relation Age of Onset   Stroke Mother 52       cerebral hemorrhage   Hypertension Mother    Hypertension Father    Heart disease Father    Breast cancer Sister        1996   Breast cancer Sister 57   Breast cancer Maternal Aunt     Past Surgical History:  Procedure Laterality Date   CATARACT EXTRACTION  02/2011   left eye   TUBAL LIGATION  1973   VULVA /PERINEUM BIOPSY  2010   for hypopigmentation, non-specific result   Social History   Occupational History   Occupation: retired    Associate Professor: [employer]    Comment: career development  Tobacco Use   Smoking status: Former    Packs/day: 0.50    Years: 15.00    Total pack years: 7.50    Types: Cigarettes    Quit date: 04/02/1994    Years since quitting: 28.0   Smokeless tobacco: Never  Vaping Use   Vaping Use: Never used  Substance and Sexual Activity   Alcohol use: No   Drug use: No    Sexual activity: Not Currently    Partners: Male    Birth control/protection: Surgical    Comment: BTSP

## 2022-04-16 ENCOUNTER — Telehealth: Payer: Self-pay | Admitting: Orthopedic Surgery

## 2022-04-16 NOTE — Telephone Encounter (Signed)
I called and sw pt advised that it was a 6 day dose pak that was given and she will take pills 6,5,4,3,2,1 and will complete the course on the 6th day. She asked when she should take this and I advised to take in the morning. Prednisone can cause difficulty with sleeping or insomnia and so she should avoid taking at night. Pt voiced understanding and will call with any other questions.

## 2022-04-16 NOTE — Telephone Encounter (Signed)
Pt called requesting a call back. Pt states Dr Marlou Sa prescribed prednisone but bottle has no dosage instructions. Please call pt about this matter at [phone].

## 2022-04-17 ENCOUNTER — Ambulatory Visit: Payer: Medicare Other | Admitting: Internal Medicine

## 2022-04-17 ENCOUNTER — Encounter: Payer: Self-pay | Admitting: Internal Medicine

## 2022-04-17 DIAGNOSIS — E041 Nontoxic single thyroid nodule: Secondary | ICD-10-CM

## 2022-04-17 LAB — VITAMIN D 25 HYDROXY (VIT D DEFICIENCY, FRACTURES): VITD: 35.33 ng/mL (ref 30.00–100.00)

## 2022-04-17 NOTE — Patient Instructions (Signed)
Please stop at the lab.  Continue 2000 units vit D daily.  Please come back for a follow-up appointment in 1 year.

## 2022-04-17 NOTE — Progress Notes (Signed)
Patient ID: Laura Griffin, female   DOB: 1945-06-30, 77 y.o.   MRN: [id number]   HPI  Laura Griffin is a 77 y.o.-year-old female, initially referred by her PCP, Dr. Ardelia Mems, returning for follow-up for hypercalcemia/hyperparathyroidism and thyroid nodule.  Last visit 1 year ago.    Reviewed history: She has chronic sciatica, but it is much better - reduced lifting and other straining. She also has OA in R knee - sees Dr. Alphonzo Severance at [facility].  She has gel injections, but no steroid injections. No falls or fractures.  She still has some problems with balance - uses a cane.  Elevated calcium  - seen ~2005 when she was on calcium supplements.  She was advised to stop the supplements and calcium normalized  - however, in 2019, her calcium started to return elevated again.   - in 05/2019, we stopped multivitamins with calcium.  Subsequent PTH and calcium levels were normal. - in 01/2021, calcium was high again and it continued to fluctuate around the upper limit of the target range afterwards  I reviewed her pertinent labs: Component     Latest Ref Rng & Units 04/11/2021  Calcium Ionized     4.8 - 5.6 mg/dL 5.57   Component     Latest Ref Rng & Units 03/22/2020  Calcium Ionized     4.8 - 5.6 mg/dL 5.5   Lab Results  Component Value Date   PTH 40 09/23/2019   PTH 29 06/10/2019   PTH 41 01/13/2019   PTH Comment 01/13/2019   PTH 44.3 04/25/2011   PTH 23.8 08/28/2007   CALCIUM 10.4 (H) 11/06/2021   CALCIUM 10.4 (H) 02/01/2021   CALCIUM 9.9 12/12/2020   CALCIUM 10.4 09/23/2019   CALCIUM 10.5 06/15/2019   CALCIUM 10.6 (H) 01/13/2019   CALCIUM 10.9 (H) 12/30/2018   CALCIUM 11.4 (H) 12/22/2018   CALCIUM 11.2 (H) 12/15/2018   CALCIUM 10.3 09/02/2017  12/15/2018: Corrected calcium 10.48 (ULN 10.3).  No history of osteoporosis.   DXA scan report from 2011: T-scores were normal.   DXA scan report from 05/03/2021: T-scores were normal  No history of kidney stones.  24-hour  urine calcium (02/08/2019) was normal, with a calcium of 91 mg/24 h, however, urine creatinine level was not checked at that time so it was unclear whether this was an appropriate collection or not.  We checked this again but unfortunately a BMP was not drawn so we could not calculate the fractional excretion of calcium: Component     Latest Ref Rng & Units 06/12/2019  Creatinine, 24H Ur     0.50 - 2.15 g/24 h 0.77  Calcium, 24H Urine     mg/24 h 66   After stopping her calcium supplement (multivitamin), her calcium, PTH, calcitriol levels were normal, as were her phosphorus and magnesium Component     Latest Ref Rng & Units 06/10/2019  Vitamin D 1, 25 (OH) Total     18 - 72 pg/mL 30  Vitamin D3 1, 25 (OH)     pg/mL 30  Vitamin D2 1, 25 (OH)     pg/mL <8  PTH, Intact     15 - 65 pg/mL 29  Phosphorus     2.3 - 4.6 mg/dL 3.1  Magnesium     1.5 - 2.5 mg/dL 1.8   Component     Latest Ref Rng & Units 06/15/2019  Calcium     8.4 - 10.5 mg/dL 10.5   + Mild CKD.  Latest BUN/Cr: Lab Results  Component Value Date   BUN 14 11/06/2021   BUN 10 02/01/2021   CREATININE 1.06 (H) 11/06/2021   CREATININE 0.93 02/01/2021   Previously on HCTZ, stopped in 08-12/2018.  No history of vitamin D deficiency: Lab Results  Component Value Date   VD25OH 37.56 04/11/2021   VD25OH 37.4 01/28/2020   VD25OH 30.8 09/23/2019   VD25OH 31.79 05/26/2019   She was previously on calcium (multivitamins - 300 mg) + 1000 units vitamin D daily.  We stopped her calcium and I advised her to increase her vitamin D daily dose to 2000 units daily.  She is now on this dose.  She denies a FH of hypercalcemia, pituitary tumors, thyroid cancer, but sister fell and fractured wrist - likely osteoporosis.   Pt. also has a history of HTN, OA, GERD (not on PPIs), sciatica.  Her mother died of cerebral hemorrhage in her late [id number] as a complication of uncontrolled hypertension.  Thyroid nodule: -Subcentimeter -Without  worrisome features  Reviewed her thyroid ultrasound report from 2017: Right thyroid lobe : 2.6 cm x 0.8 cm x 1.7 cm. Heterogeneous appearance of the right thyroid tissue without significantly increased flow.   Left thyroid lobe : 3.6 cm x 1.2 cm x 1.3 cm. Heterogeneous appearance of left-sided thyroid tissue without significantly increased flow. Nodule at the inferior left thyroid measures 8 mm x 6 mm x 7 mm with no internal calcifications.  Isthmus Thickness: 5 mm.  No nodules visualized.  Lymphadenopathy: None visualized.   IMPRESSION: Heterogeneous thyroid with single left-sided nodule.   Findings do not meet current SRU consensus criteria for biopsy. Follow-up by clinical exam is recommended. In a patient of this age with no risk factors for thyroid carcinoma, lengthening the surveillance interval beyond 12 months is a reasonable strategy, as thyroid cancers <2cm are known to be indolent with nearly 100% 10-year survival. If the patient has known risk factors for thyroid carcinoma, a follow-up ultrasound in 12 months is recommended.  Pt denies: - feeling nodules in neck - hoarseness - dysphagia (only occasionally, with pills - fih oil) - choking  No family history of thyroid cancer.  No history of radiation to head or neck.  Latest TSH: Lab Results  Component Value Date   TSH 1.46 03/22/2020   TSH 1.87 05/26/2019   TSH 1.638 05/05/2015   TSH 0.807 07/06/2013   TSH 1.196 10/03/2010   TSH 1.116 11/11/2007   ROS: + see HPI Musculoskeletal: + muscle aches/no joint aches  I reviewed pt's medications, allergies, PMH, social hx, family hx, and changes were documented in the history of present illness. Otherwise, unchanged from my initial visit note.  Past Medical History:  Diagnosis Date   Allergic rhinitis 07/19/2014   Arthritis of knee, right 03/11/2006   Bursitis of shoulder, right 09/19/1999   Cervical radiculopathy at C6 05/24/2011   Beginning Nov 2012.  Multi-level foraminal narrowing in C-spine plain films    Diverticulosis    DIVERTICULOSIS OF COLON 05/30/2006   Colonoscopy 06/2014 - multiple diverticula, no other abnormalities, no further colonoscopies recommended given patient's age of 22     GERD (gastroesophageal reflux disease) 07/19/2014   Diagnosed clinically by [facility] in January of 2016.     Glaucoma    Hearing decreased 08/07/2011   With whooshing tinnitus Mild bilateral loss on 08/2012 audiology who will recheck her hearing in one year     History of herniated intervertebral disc    Hypertension  HYPERTENSION, BENIGN SYSTEMIC 05/30/2006        Left sided sciatica 8/95,11/00   CT confirmed    LOW BACK PAIN SYNDROME 05/10/2009   Qualifier: Diagnosis of  By: Javier Glazier CMA,, [place]  Mild C 3-4, 4-5 lumbar spinal stenosis MRI 08/2011, Diffuse lower thoracic and lumbar DDD    Mitral valve prolapse    per patient heart murmur   Orthostatic hypotension 06/12/2013   Osteoarthritis    Osteoarthritis of right knee 12/26/2012   Confirmed on xray 07/2014    OSTEOARTHRITIS, MULTI SITES 05/30/2006   She is stoic about pain and wants to avoid surgeries Past xrays have documented DJD in right knee and lumbar spine     Overweight (BMI 25.0-29.9) 05/30/2006   Pitting edema 07/19/2014   Bilateral 1+ pitting edema of calves first noted on 07/19/2014.     Sciatica 05/30/2006   Qualifier: Diagnosis of  By: Walker Kehr MD, [place]  Involved left leg in 02/1999    Swallowing difficulty 07/06/2013   Urge incontinence 06/01/2008   Qualifier: Diagnosis of  By: Walker Kehr MD, Patrick [place]     Past Surgical History:  Procedure Laterality Date   CATARACT EXTRACTION  02/2011   left eye   [place] BIOPSY  2010   for hypopigmentation, non-specific result   Social History   Socioeconomic History   Marital status: Married    Spouse name: Jeneen Rinks   Number of children: 1   Years of education: 18   Highest education level: Master's degree (e.g.,  MA, MS, MEng, MEd, MSW, MBA)  Occupational History   Occupation: retired    Fish farm manager: [employer]    Comment: career development  Tobacco Use   Smoking status: Former    Packs/day: 0.50    Years: 15.00    Total pack years: 7.50    Types: Cigarettes    Quit date: 04/02/1994    Years since quitting: 28.0   Smokeless tobacco: Never  Vaping Use   Vaping Use: Never used  Substance and Sexual Activity   Alcohol use: No   Drug use: No   Sexual activity: Not Currently    Partners: Male    Birth control/protection: Surgical    Comment: BTSP  Other Topics Concern   Not on file  Social History Narrative   Patient lives with husband in [place].    Patient has one son and two grandchildren who live nearby.   Patient enjoys seeing her grandchildren grow up and watching their sporting events.    Patient enjoys reading and her book club, crossword puzzles, and shopping.   Husband in remission for multiple myeloma and doing well.          Social Determinants of Health   Financial Resource Strain: Low Risk  (06/14/2021)   Overall Financial Resource Strain (CARDIA)    Difficulty of Paying Living Expenses: Not hard at all  Food Insecurity: No Food Insecurity (06/14/2021)   Hunger Vital Sign    Worried About Running Out of Food in the Last Year: Never true    Ran Out of Food in the Last Year: Never true  Transportation Needs: No Transportation Needs (06/14/2021)   PRAPARE - Hydrologist (Medical): No    Lack of Transportation (Non-Medical): No  Physical Activity: Inactive (06/14/2021)   Exercise Vital Sign    Days of Exercise per Week: 0 days    Minutes of Exercise per Session: 0 min  Stress: No  Stress Concern Present (06/14/2021)   [employer] of Occupational Health - Occupational Stress Questionnaire    Feeling of Stress : Not at all  Social Connections: Moderately Integrated (06/14/2021)   Social Connection and Isolation Panel [NHANES]     Frequency of Communication with Friends and Family: More than three times a week    Frequency of Social Gatherings with Friends and Family: More than three times a week    Attends Religious Services: Never    Database administrator or Organizations: Yes    Attends Engineer, structural: More than 4 times per year    Marital Status: Married  Catering manager Violence: Not At Risk (06/14/2021)   Humiliation, Afraid, Rape, and Kick questionnaire    Fear of Current or Ex-Partner: No    Emotionally Abused: No    Physically Abused: No    Sexually Abused: No   Current Outpatient Medications on File Prior to Visit  Medication Sig Dispense Refill   acetaminophen (TYLENOL) 650 MG CR tablet Take 2 tablets (1,300 mg total) by mouth every 8 (eight) hours as needed. (Patient taking differently: Take 1,300 mg by mouth 3 (three) times daily.) 90 tablet 1   amLODipine (NORVASC) 10 MG tablet TAKE 1 TABLET(10 MG) BY MOUTH AT BEDTIME 90 tablet 3   aspirin EC 81 MG tablet Take 81 mg by mouth daily.     baclofen (LIORESAL) 10 MG tablet Take 0.5 tablets (5 mg total) by mouth 2 (two) times daily as needed for muscle spasms. 30 each 0   brimonidine-timolol (COMBIGAN) 0.2-0.5 % ophthalmic solution Place 2 drops into both eyes every 12 (twelve) hours.     Cholecalciferol 25 MCG (1000 UT) capsule Take 2,000 Units by mouth daily.     diazepam (VALIUM) 5 MG tablet Take 1 tablet 30 minutes prior to procedure, repeat if needed 3 tablet 0   gabapentin (NEURONTIN) 400 MG capsule TAKE 2 CAPSULES(800 MG) BY MOUTH THREE TIMES DAILY 540 capsule 1   latanoprost (XALATAN) 0.005 % ophthalmic solution PLACE 1 DROP INTO BOTH EYES EVERY EVENING/BEDTIME     losartan (COZAAR) 50 MG tablet TAKE 1 TABLET BY MOUTH DAILY FOR BLOOD PRESSURE 90 tablet 1   metoprolol succinate (TOPROL-XL) 50 MG 24 hr tablet TAKE 1 TABLET(50 MG) BY MOUTH AT BEDTIME WITH OR IMMEDIATELY FOLLOWING A MEAL 90 tablet 3   Omega-3 Fatty Acids (FISH OIL PO) Take  by mouth.     predniSONE (STERAPRED UNI-PAK 21 TAB) 5 MG (21) TBPK tablet Take dosepak as directed 21 tablet 0   No current facility-administered medications on file prior to visit.   Allergies  Allergen Reactions   Sulfamethoxazole-Trimethoprim     REACTION: rash: nausea, swelling   Hctz [Hydrochlorothiazide] Other (See Comments)    Elevated calcium   Family History  Problem Relation Age of Onset   Stroke Mother 69       cerebral hemorrhage   Hypertension Mother    Hypertension Father    Heart disease Father    Breast cancer Sister        7   Breast cancer Sister 60   Breast cancer Maternal Aunt     PE: BP 120/82 (BP Location: Left Arm, Patient Position: Sitting, Cuff Size: Normal)   Pulse 97   Ht 5\' 2"  (1.575 m)   Wt 182 lb 6.4 oz (82.7 kg)   LMP 04/02/1996   SpO2 96%   BMI 33.36 kg/m  Wt Readings from Last 3 Encounters:  04/17/22 182 lb 6.4 oz (82.7 kg)  01/04/22 176 lb 8 oz (80.1 kg)  11/06/21 174 lb 6.4 oz (79.1 kg)   Constitutional: overweight, in NAD Eyes:  EOMI, no exophthalmos ENT: no neck masses, no cervical lymphadenopathy Cardiovascular: RRR, No MRG Respiratory: CTA B Musculoskeletal: no deformities Skin:no rashes Neurological: no tremor with outstretched hands  Assessment: 1. Hypercalcemia/hyperparathyroidism  2.  Thyroid nodule  Plan: Patient with several instances of elevated calcium, with the highest being 11.4.  An intact PTH was not suppressed, at 41, for calcium of 10.6. -She has no history of vitamin D deficiency.  We checked her vitamin D level at last visit and this was normal: Lab Results  Component Value Date   VD25OH 37.56 04/11/2021  -No apparent complications from hypercalcemia: No history of nephrolithiasis, osteoporosis (reviewed most recent DXA scan report from 05/03/2021, which showed normal T-scores), fractures, abdominal pain, bone pain, depression -We previously stopped her HCTZ and multivitamin. We did discuss about the  possibility of primary hyperparathyroidism versus familial hypocalciuric hypercalcemia, renal insufficiency, etc.  Her calcium levels were fluctuating in the past and the urine calcium was not elevated.  In fact, it was closer to the lower limit of normal.  I could not calculate a fractional excretion of calcium as a creatinine level was not obtained at the time of her urine collection, however, my suspicion for [place] was low, since she had numerous instances with normal calcium in the past and also, her magnesium level was not elevated -Most recent ionized calcium was normal this checked at last visit, however, she had a mildly elevated total calcium on 11/06/2021. -At today's visit, we will repeat her ionized calcium and vitamin D. -We reviewed possible consequences of hyperparathyroidism: Osteoporosis, nephrolithiasis, however, new studies show that mild hyperparathyroidism may be stable for many years and without significant complications -We decided to continue to follow her calcium without intervention if it remains only slightly high or fluctuating around the higher limit of the target range -Reviewed criteria for surgery: Increased calcium by 1 mg/dL above the upper limit of normal -previously, not recently Kidney ds.  Osteoporosis (or vertebral fracture) Age <66 years old Newer criteria (2013): High UCa >400 mg/d and increased stone risk by biochemical stone risk analysis Presence of nephrolithiasis or nephrocalcinosis Pt's preference!  -I will see her back in 1 year  2.  Thyroid nodule -No neck compression symptoms -Per review of her thyroid ultrasound report from 2007, she had a small nodule, measuring 8 mm in the largest dimension. -We discussed that no follow-up was needed for such a small, low risk, nodule, especially as she does not have risk factors for thyroid cancer (no family history of the disease or exposure to radiation). -The thyroid nodule is not palpated on exam today -No  follow-up is needed for now -Latest TSH was reviewed and this was normal: Lab Results  Component Value Date   TSH 1.46 03/22/2020   Component     Latest Ref Rng 04/17/2022  VITD     30.00 - 100.00 ng/mL 35.33   Calcium Ionized     4.7 - 5.5 mg/dL 5.5    Labs are normal.  Philemon Kingdom, MD PhD [facility]

## 2022-04-18 LAB — CALCIUM, IONIZED: Calcium, Ion: 5.5 mg/dL (ref 4.7–5.5)

## 2022-05-02 ENCOUNTER — Ambulatory Visit
Admission: RE | Admit: 2022-05-02 | Discharge: 2022-05-02 | Disposition: A | Discharge status: Home / Self Care | Payer: Medicare Other | Source: Ambulatory Visit | Attending: Orthopedic Surgery | Admitting: Orthopedic Surgery

## 2022-05-02 DIAGNOSIS — M5412 Radiculopathy, cervical region: Secondary | ICD-10-CM

## 2022-05-02 DIAGNOSIS — M25511 Pain in right shoulder: Secondary | ICD-10-CM

## 2022-05-02 DIAGNOSIS — M542 Cervicalgia: Secondary | ICD-10-CM | POA: Diagnosis not present

## 2022-05-02 DIAGNOSIS — M4802 Spinal stenosis, cervical region: Secondary | ICD-10-CM | POA: Diagnosis not present

## 2022-05-09 ENCOUNTER — Ambulatory Visit (INDEPENDENT_AMBULATORY_CARE_PROVIDER_SITE_OTHER): Payer: Medicare Other | Admitting: Surgical

## 2022-05-09 ENCOUNTER — Other Ambulatory Visit: Payer: Self-pay

## 2022-05-09 DIAGNOSIS — M5412 Radiculopathy, cervical region: Secondary | ICD-10-CM | POA: Diagnosis not present

## 2022-05-12 ENCOUNTER — Encounter: Payer: Self-pay | Admitting: Orthopedic Surgery

## 2022-05-12 NOTE — Progress Notes (Signed)
Office Visit Note   Patient: Laura Griffin           Date of Birth: 1945-06-13           MRN: [id number] Visit Date: 05/09/2022 Requested by: Leeanne Rio, [place],  [place] [id number] PCP: Leeanne Rio, MD  Subjective: Chief Complaint  Patient presents with   Other     Scan review    HPI: Laura Griffin is a 77 y.o. female who presents to the office for MRI review. Patient denies any changes in symptoms.  Continues to complain mainly of neck pain with radiation of pain into bilateral shoulders with some tingling in the fingers.  She also does admit to noticing some increased difficulty with her gait though this is not severe for her.  MRI results revealed: MR Cervical Spine w/o contrast  Result Date: 05/02/2022 CLINICAL DATA:  Neck pain radiating into right shoulder for 3 months. No known injury or prior relevant surgery. EXAM: MRI CERVICAL SPINE WITHOUT CONTRAST TECHNIQUE: Multiplanar, multisequence MR imaging of the cervical spine was performed. No intravenous contrast was administered. COMPARISON:  Cervical spine radiographs 04/09/2022. No previous MRI available. FINDINGS: Alignment: Physiologic. There is a minimal degenerative anterolisthesis at C3-4. Vertebrae: No acute or suspicious osseous findings. There are multilevel endplate degenerative changes throughout the cervical spine. Cord: Mild cord flattening, greatest at C4-5. No definite cord edema or hemorrhage. The cervical cord appears normal in caliber. Posterior Fossa, vertebral arteries, paraspinal tissues: Visualized portions of the posterior fossa appear unremarkable.Evidence for partial sphenoid and ethmoid sinus opacification with probable thickening of the nasal turbinates. Bilateral vertebral artery flow voids. No significant paraspinal findings. Disc levels: C2-3: Mild disc bulging with uncinate spurring and facet hypertrophy asymmetric to the left. No cord deformity. Mild  foraminal narrowing bilaterally. C3-4: Chronic spondylosis with loss of disc height, posterior osteophytes and uncinate spurring asymmetric to the right. Moderate right-greater-than-left foraminal narrowing. The CSF surrounding the cord is effaced without cord deformity. Severe right and moderate left foraminal narrowing. C4-5: Chronic spondylosis with loss of disc height, posterior osteophytes and uncinate spurring asymmetric to the right. Mild right-sided cord flattening with narrowing of the AP diameter of the canal to 5 mm. Asymmetric facet hypertrophy on the right. Severe foraminal narrowing bilaterally which could affect either C5 nerve root. C5-6: Chronic spondylosis with loss of disc height, posterior osteophytes and bilateral uncinate spurring. Mild facet hypertrophy, greater on the left. The CSF surrounding the cord is effaced without cord deformity. Moderate foraminal narrowing bilaterally. C6-7: Disc bulging with posterior osteophytes and bilateral uncinate spurring. No significant central spinal stenosis. Mild to moderate foraminal narrowing bilaterally. C7-T1: Mild spondylosis with disc bulging and bilateral facet hypertrophy. No significant spinal stenosis or nerve root encroachment. T1-2: Chronic degenerative disc disease with disc bulging and posterior osteophytes. No significant spinal stenosis or nerve root encroachment. IMPRESSION: 1. Multilevel cervical spondylosis, most advanced at C4-5 where there is moderate to severe spinal stenosis and severe foraminal narrowing bilaterally which could affect either C5 nerve root. There is right-sided cord flattening without definite abnormal cord signal. 2. Mild spinal stenosis at C3-4 and C5-6 without cord deformity or abnormal cord signal. 3. Moderate to severe foraminal narrowing at the C3-4, C5-6 and C6-7 levels. 4. No acute osseous findings. 5. Paranasal sinus disease. Electronically Signed   By: Richardean Sale M.D.   On: 05/02/2022 14:32  ROS: All systems reviewed are negative as they relate to the chief complaint within the history of present illness.  Patient denies fevers or chills.  Assessment & Plan: Visit Diagnoses:  1. Radiculopathy, cervical region     Plan: [name] is a 77 y.o. female who presents to the office for review of cervical spine MRI.  She has neck pain with radicular pain into primarily the shoulder regions bilaterally.  MRI demonstrates severe foraminal narrowing as well as severe spinal stenosis at multiple levels with flattening of the spinal cord.  No definitive signal change in the cord but with how tight the spinal canal looks, plan for further evaluation by Dr. Laurance Flatten on whether this is more of a surgical problem or if she can try more conservative management given her lack of weakness.  She would like to avoid surgery if at all possible.  Follow-Up Instructions: No follow-ups on file.   Orders:  No orders of the defined types were placed in this encounter.  No orders of the defined types were placed in this encounter.     Procedures: No procedures performed   Clinical Data: No additional findings.  Objective: Vital Signs: LMP 04/02/1996   Physical Exam:  Constitutional: Patient appears well-developed HEENT:  Head: Normocephalic Eyes:EOM are normal Neck: Normal range of motion Cardiovascular: Normal rate Pulmonary/chest: Effort normal Neurologic: Patient is alert Skin: Skin is warm Psychiatric: Patient has normal mood and affect  Ortho Exam: Ortho exam demonstrates 5/5 motor strength of bilateral EPL, FPL, finger abduction, grip strength testing, pronation/supination, bicep, tricep, deltoid.  Axillary nerve intact with deltoid firing.  She has negative Hoffmann sign bilaterally.  She is able to maintain her posture without much drifting while her eyes are closed and she is standing upright.  Specialty Comments:  No specialty comments available.  Imaging: No  results found.   PMFS History: Patient Active Problem List   Diagnosis Date Noted   Stress incontinence 07/11/2021   Skin lesion 12/08/2020   Right-sided back pain 11/08/2020   Ganglion cyst of dorsum of right wrist [id number]   Lichen sclerosus of female genitalia 09/25/2018    Class: Chronic   Hyperlipidemia [id number]   Lichen planus [id number]   Numbness and tingling of right leg 10/19/2016   Shoulder pain 03/01/2016   Headache 03/01/2016   Asymmetric SNHL (sensorineural hearing loss) 01/10/2016   Tinnitus of both ears 01/10/2016   Hearing loss 10/07/2015   Thyroid nodule 05/24/2015   Globus sensation 05/05/2015   Neck pain 01/07/2015   Allergic rhinitis 07/19/2014   GERD (gastroesophageal reflux disease) 07/19/2014   Pitting edema 07/19/2014   Swallowing difficulty 07/06/2013   Orthostatic hypotension 06/12/2013   Osteoarthritis of right knee 12/26/2012   GLAUCOMA 11/04/2009   DDD (degenerative disc disease), lumbar 05/10/2009   URGE INCONTINENCE 06/01/2008   Overweight (BMI 25.0-29.9) 05/30/2006   HYPERTENSION, BENIGN SYSTEMIC 05/30/2006   Osteoarthritis 05/30/2006   Past Medical History:  Diagnosis Date   Allergic rhinitis 07/19/2014   Arthritis of knee, right 03/11/2006   Bursitis of shoulder, right 09/19/1999   Cervical radiculopathy at C6 05/24/2011   Beginning Nov 2012. Multi-level foraminal narrowing in C-spine plain films    Diverticulosis    DIVERTICULOSIS OF COLON 05/30/2006   Colonoscopy 06/2014 - multiple diverticula, no other abnormalities, no further colonoscopies recommended given patient's age of 16     GERD (gastroesophageal reflux disease) 07/19/2014   Diagnosed clinically by [facility] in January of 2016.  Glaucoma    Hearing decreased 08/07/2011   With whooshing tinnitus Mild bilateral loss on 08/2012 audiology who will recheck her hearing in one year     History of herniated intervertebral disc    Hypertension    HYPERTENSION,  BENIGN SYSTEMIC 05/30/2006        Left sided sciatica 8/95,11/00   CT confirmed    LOW BACK PAIN SYNDROME 05/10/2009   Qualifier: Diagnosis of  By: Javier Glazier CMA,, [place]  Mild C 3-4, 4-5 lumbar spinal stenosis MRI 08/2011, Diffuse lower thoracic and lumbar DDD    Mitral valve prolapse    per patient heart murmur   Orthostatic hypotension 06/12/2013   Osteoarthritis    Osteoarthritis of right knee 12/26/2012   Confirmed on xray 07/2014    OSTEOARTHRITIS, MULTI SITES 05/30/2006   She is stoic about pain and wants to avoid surgeries Past xrays have documented DJD in right knee and lumbar spine     Overweight (BMI 25.0-29.9) 05/30/2006   Pitting edema 07/19/2014   Bilateral 1+ pitting edema of calves first noted on 07/19/2014.     Sciatica 05/30/2006   Qualifier: Diagnosis of  By: Walker Kehr MD, [place]  Involved left leg in 02/1999    Swallowing difficulty 07/06/2013   Urge incontinence 06/01/2008   Qualifier: Diagnosis of  By: Walker Kehr MD, Patrick Jupiter      Family History  Problem Relation Age of Onset   Stroke Mother 37       cerebral hemorrhage   Hypertension Mother    Hypertension Father    Heart disease Father    Breast cancer Sister        1996   Breast cancer Sister 91   Breast cancer Maternal Aunt     Past Surgical History:  Procedure Laterality Date   CATARACT EXTRACTION  02/2011   left eye   TUBAL LIGATION  1973   VULVA [place] BIOPSY  2010   for hypopigmentation, non-specific result   Social History   Occupational History   Occupation: retired    Fish farm manager: [employer]    Comment: career development  Tobacco Use   Smoking status: Former    Packs/day: 0.50    Years: 15.00    Total pack years: 7.50    Types: Cigarettes    Quit date: 04/02/1994    Years since quitting: 28.1   Smokeless tobacco: Never  Vaping Use   Vaping Use: Never used  Substance and Sexual Activity   Alcohol use: No   Drug use: No   Sexual activity: Not Currently    Partners: Male    Birth control/protection:  Surgical    Comment: BTSP

## 2022-05-22 ENCOUNTER — Telehealth: Payer: Self-pay | Admitting: Orthopedic Surgery

## 2022-05-22 ENCOUNTER — Ambulatory Visit: Payer: 59 | Admitting: Orthopedic Surgery

## 2022-05-22 NOTE — Telephone Encounter (Signed)
Patient needs appt work in missed appt today said she was told 4:15 appt was at 9:15 no avail appts for 30 min slot for a month out unless someone cancels please advise if she should wait or if she can be worked in--Dean patient appt for  Radiculopathy, cervical region

## 2022-05-22 NOTE — Telephone Encounter (Signed)
Worked in on 05/30/22 @ 1:15, I offered her Thursday 05/24/22 @ 1030, but she could not make that appt

## 2022-05-25 ENCOUNTER — Ambulatory Visit: Payer: 59 | Admitting: Orthopedic Surgery

## 2022-05-30 ENCOUNTER — Ambulatory Visit (INDEPENDENT_AMBULATORY_CARE_PROVIDER_SITE_OTHER): Payer: Medicare Other

## 2022-05-30 ENCOUNTER — Ambulatory Visit (INDEPENDENT_AMBULATORY_CARE_PROVIDER_SITE_OTHER): Payer: Medicare Other | Admitting: Orthopedic Surgery

## 2022-05-30 ENCOUNTER — Encounter: Payer: Self-pay | Admitting: Orthopedic Surgery

## 2022-05-30 VITALS — BP 192/108 | HR 58 | Ht 62.0 in | Wt 183.0 lb

## 2022-05-30 DIAGNOSIS — M5412 Radiculopathy, cervical region: Secondary | ICD-10-CM | POA: Diagnosis not present

## 2022-05-30 NOTE — Progress Notes (Signed)
Orthopedic Spine Surgery Office Note  Assessment: Patient is a 77 y.o. female with signs and symptoms consistent with progressive cervical spondylotic myelopathy.  Has central stenosis at C3/4 and C4/5.  She also has foraminal stenosis at multiple levels that would explain her radicular type pain.   Plan: -I explained the natural history of cervical spondylotic myelopathy with her in the office today.  I told her that the usual course is for stepwise decline.  I explained that because of this natural history surgery is recommended to prevent progression.  I discussed the fact that surgery is not done to reverse any symptoms that currently exist.  I provided her with a handout with the diagnosis and symptoms that are consistent with that diagnosis.  She was not interested in surgery at this time and wanted to think about it more.  She is concerned about surgery at her age.  I told her she does not have to make a decision today and that I will continue to monitor her.  I also instructed her to come in sooner if her symptoms continue to progress.  I told her that she can make a decision about surgery before next visit and we can change her treatment plan -Patient should return to office in 12 weeks, x-rays at next visit: None   Patient expressed understanding of the plan and all questions were answered to the patient's satisfaction.   ___________________________________________________________________________   History:  Patient is a 77 y.o. female who presents today for cervical spine.  Patient comes in today for neck pain that radiates into bilateral upper extremities.  She states that she has pain that starts in her neck and goes into her bilateral shoulders and the lateral aspect of her arms.  It does not radiate past the elbow.  She has had the symptoms for several months now.  She also feels that her neck twitches to 1 side gets stuck and is painful.  She says she is afraid that will get stuck in  apposition but she is always able to move it back.  On further questioning, she does note difficulty with balance.  She states that she feels off balance frequently and has had to start using a cane.  She feels that her balance is gotten worse within the last couple months.  She has also noticed within the last 2 months clumsiness with her hands.  She notes that she is not able to button her shirt and she has trouble with fine motor skills in her hands.  She feels that this is gotten worse since she first noticed it.   Weakness: Denies Difficulty with fine motor skills (e.g., buttoning shirts, handwriting): Yes, for the last few months and has been getting worse Symptoms of imbalance: Yes, has been using a cane and feels that that has gotten worse as well Paresthesias and numbness: Denies Bowel or bladder incontinence: Yes, several year history of urinary incontinence.  Uses depends.  No recent changes.  No bowel incontinence. Saddle anesthesia: Denies  Treatments tried: Activity modification, cane use, Tylenol  Review of systems: Denies fevers and chills, night sweats, unexplained weight loss, history of cancer, pain that wakes them at night  Past medical history: Hypertension Osteoarthritis Diverticulosis Glaucoma GERD  Allergies: Sulfa  Past surgical history:  None  Social history: Denies use of nicotine product (smoking, vaping, patches, smokeless) Alcohol use: Denies Denies recreational drug use  Physical Exam:  BMI of 33.5  General: no acute distress, appears stated age Neurologic:  alert, answering questions appropriately, following commands Respiratory: unlabored breathing on room air, symmetric chest rise Psychiatric: appropriate affect, normal cadence to speech   MSK (spine):  -Strength exam      Left  Right Grip strength                5/5  5/5 Interosseus   5/5   5/5 Wrist extension  5/5  5/5 Wrist flexion   5/5  5/5 Elbow flexion   5/5  5/5 Deltoid     5/5  5/5  EHL    4/5  4/5 TA    5/5  5/5 GSC    5/5  5/5 Knee extension  5/5  5/5 Hip flexion   4/5  4/5  -Sensory exam    Sensation intact to light touch in L3-S1 nerve distributions of bilateral lower extremities  Sensation intact to light touch in C5-T1 nerve distributions of bilateral upper extremities  -Brachioradialis DTR: 2/4 on the left, 2/4 on the right -Biceps DTR: 2/4 on the left, 2/4 on the right -Achilles DTR: 2/4 on the left, 2/4 on the right -Patellar tendon DTR: 2/4 on the left, 2/4 on the right  -Spurling: Negative bilaterally -Hoffman sign: Negative bilaterally -Clonus: No beats bilaterally -Interosseous wasting: None seen -Grip and release test: Positive -Romberg: Positive -Gait: Wide-based and uses cane -Imbalance with tandem gait: Yes  Left shoulder exam: No pain through range of motion, pain with 4/5 strength with Jobe test, no weakness with external rotation with arm at side, negative belly press Right shoulder exam: No pain through range of motion, pain with 4/5 strength with Jobe test, no weakness with external rotation with arm at side, negative belly press  Tinel's at wrist: Negative bilaterally Phalen's at wrist: Negative bilaterally Durkan's: Negative bilaterally  Tinel's at elbow: Negative bilaterally  Imaging: XR of the cervical spine from 04/09/2022 and 05/30/2022 was independently reviewed and interpreted, showing multilevel spondylosis with disc height loss and anterior osteophyte formation.  Spondylolisthesis at C3/4 that shifts about 1 mm between flexion/extension views.  No fracture or dislocation seen.  MRI of the cervical spine from 05/02/2022 was independently reviewed and interpreted, showing central stenosis at C3 3/4 and C4/5.  Foraminal stenosis on the right at C2/3, bilateral foraminal stenosis at C3/4, bilateral foraminal stenosis at C4/5, bilateral foraminal stenosis at C5/6, and bilateral foraminal stenosis at C6/7.  No T2 cord  signal change seen.   Patient name: Laura Griffin Patient MRN: [id number] Date of visit: 05/30/22

## 2022-06-25 ENCOUNTER — Other Ambulatory Visit: Payer: Self-pay | Admitting: Family Medicine

## 2022-07-30 ENCOUNTER — Ambulatory Visit (INDEPENDENT_AMBULATORY_CARE_PROVIDER_SITE_OTHER): Payer: Medicare Other

## 2022-07-30 DIAGNOSIS — Z0000 Encounter for general adult medical examination without abnormal findings: Secondary | ICD-10-CM

## 2022-07-30 NOTE — Patient Instructions (Signed)

## 2022-07-30 NOTE — Progress Notes (Signed)
I connected with  Carie Caddy on 07/30/22 by a audio enabled telemedicine application and verified that I am speaking with the correct person using two identifiers.  Patient Location: Home  Provider Location: Home Office  I discussed the limitations of evaluation and management by telemedicine. The patient expressed understanding and agreed to proceed.   Subjective:   Laura Griffin is a 77 y.o. female who presents for Medicare Annual (Subsequent) preventive examination.  Review of Systems    Per HPI unless specifically indicated below. Cardiac Risk Factors include: advanced age (>4men, >94 women);female gender, Hypertension, and Hyperlipidemia.        Objective:       05/30/2022    1:16 PM 04/17/2022    9:07 AM 01/04/2022    2:37 PM  Vitals with BMI  Height 5\' 2"  5\' 2"    Weight 183 lbs 182 lbs 6 oz   BMI 33.46 33.35   Systolic 192 120 130  Diastolic 108 82 74  Pulse 58 97     There were no vitals filed for this visit. There is no height or weight on file to calculate BMI.     07/30/2022   10:21 AM 01/04/2022    2:05 PM 11/06/2021   12:25 PM 06/14/2021    2:51 PM 11/08/2020   10:44 AM 02/29/2020   10:27 AM 02/17/2020   11:27 AM  Advanced Directives  Does Patient Have a Medical Advance Directive? No No No Yes No Yes Yes  Type of Theme park manager;Living will  Healthcare Power of [place];Living will Healthcare Power of [place];Living will  Does patient want to make changes to medical advance directive?    No - Patient declined     Copy of Healthcare Power of Attorney in Chart?    No - copy requested  No - copy requested No - copy requested  Would patient like information on creating a medical advance directive? No - Patient declined No - Patient declined No - Patient declined  No - Patient declined No - Patient declined No - Patient declined    Current Medications (verified) Outpatient Encounter Medications as of 07/30/2022   Medication Sig   amLODipine (NORVASC) 10 MG tablet TAKE 1 TABLET(10 MG) BY MOUTH AT BEDTIME   aspirin EC 81 MG tablet Take 81 mg by mouth daily.   brimonidine-timolol (COMBIGAN) 0.2-0.5 % ophthalmic solution Place 2 drops into both eyes every 12 (twelve) hours.   Cholecalciferol 25 MCG (1000 UT) capsule Take 2,000 Units by mouth daily.   gabapentin (NEURONTIN) 400 MG capsule TAKE 2 CAPSULES(800 MG) BY MOUTH THREE TIMES DAILY   latanoprost (XALATAN) 0.005 % ophthalmic solution PLACE 1 DROP INTO BOTH EYES EVERY EVENING/BEDTIME   losartan (COZAAR) 50 MG tablet TAKE 1 TABLET BY MOUTH DAILY FOR BLOOD PRESSURE   metoprolol succinate (TOPROL-XL) 50 MG 24 hr tablet TAKE 1 TABLET(50 MG) BY MOUTH AT BEDTIME WITH OR IMMEDIATELY FOLLOWING A MEAL   Omega-3 Fatty Acids (FISH OIL PO) Take by mouth.   diazepam (VALIUM) 5 MG tablet Take 1 tablet 30 minutes prior to procedure, repeat if needed (Patient not taking: Reported on 07/30/2022)   [DISCONTINUED] acetaminophen (TYLENOL) 650 MG CR tablet Take 2 tablets (1,300 mg total) by mouth every 8 (eight) hours as needed. (Patient taking differently: Take 1,300 mg by mouth 3 (three) times daily.)   [DISCONTINUED] baclofen (LIORESAL) 10 MG tablet Take 0.5 tablets (5 mg total) by mouth 2 (two) times  daily as needed for muscle spasms. (Patient not taking: Reported on 07/30/2022)   [DISCONTINUED] predniSONE (STERAPRED UNI-PAK 21 TAB) 5 MG (21) TBPK tablet Take dosepak as directed (Patient not taking: Reported on 07/30/2022)   No facility-administered encounter medications on file as of 07/30/2022.    Allergies (verified) Sulfamethoxazole-trimethoprim and Hctz [hydrochlorothiazide]   History: Past Medical History:  Diagnosis Date   Allergic rhinitis 07/19/2014   Arthritis of knee, right 03/11/2006   Bursitis of shoulder, right 09/19/1999   Cervical radiculopathy at C6 05/24/2011   Beginning Nov 2012. Multi-level foraminal narrowing in C-spine plain films     Diverticulosis    DIVERTICULOSIS OF COLON 05/30/2006   Colonoscopy 06/2014 - multiple diverticula, no other abnormalities, no further colonoscopies recommended given patient's age of 65     GERD (gastroesophageal reflux disease) 07/19/2014   Diagnosed clinically by [facility] in January of 2016.     Glaucoma    Hearing decreased 08/07/2011   With whooshing tinnitus Mild bilateral loss on 08/2012 audiology who will recheck her hearing in one year     History of herniated intervertebral disc    Hypertension    HYPERTENSION, BENIGN SYSTEMIC 05/30/2006        Left sided sciatica 8/95,11/00   CT confirmed    LOW BACK PAIN SYNDROME 05/10/2009   Qualifier: Diagnosis of  By: Lorenda Hatchet CMA,, [place]  Mild C 3-4, 4-5 lumbar spinal stenosis MRI 08/2011, Diffuse lower thoracic and lumbar DDD    Mitral valve prolapse    per patient heart murmur   Orthostatic hypotension 06/12/2013   Osteoarthritis    Osteoarthritis of right knee 12/26/2012   Confirmed on xray 07/2014    OSTEOARTHRITIS, MULTI SITES 05/30/2006   She is stoic about pain and wants to avoid surgeries Past xrays have documented DJD in right knee and lumbar spine     Overweight (BMI 25.0-29.9) 05/30/2006   Pitting edema 07/19/2014   Bilateral 1+ pitting edema of calves first noted on 07/19/2014.     Sciatica 05/30/2006   Qualifier: Diagnosis of  By: Sheffield Slider MD, Wayne  Involved left leg in 02/1999    Swallowing difficulty 07/06/2013   Urge incontinence 06/01/2008   Qualifier: Diagnosis of  By: Sheffield Slider MD, Deniece Portela     Past Surgical History:  Procedure Laterality Date   CATARACT EXTRACTION  02/2011   left eye   TUBAL LIGATION  1973   VULVA /PERINEUM BIOPSY  2010   for hypopigmentation, non-specific result   Family History  Problem Relation Age of Onset   Stroke Mother 67       cerebral hemorrhage   Hypertension Mother    Hypertension Father    Heart disease Father    Breast cancer Sister        1996   Breast cancer Sister 44   Breast cancer  Maternal Aunt    Social History   Socioeconomic History   Marital status: Married    Spouse name: Fayrene Fearing   Number of children: 1   Years of education: 18   Highest education level: Master's degree (e.g., MA, MS, MEng, MEd, MSW, MBA)  Occupational History   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  Tobacco Use   Smoking status: Former    Packs/day: 0.50    Years: 15.00    Additional pack years: 0.00    Total pack years: 7.50    Types: Cigarettes    Quit date: 04/02/1994  Years since quitting: 28.3   Smokeless tobacco: Never  Vaping Use   Vaping Use: Never used  Substance and Sexual Activity   Alcohol use: No   Drug use: No   Sexual activity: Not Currently    Partners: Male    Birth control/protection: Surgical    Comment: BTSP  Other Topics Concern   Not on file  Social History Narrative   Patient lives with husband in [place].    Patient has one son and two grandchildren who live nearby.   Patient enjoys seeing her grandchildren grow up and watching their sporting events.    Patient enjoys reading and her book club, crossword puzzles, and shopping.   Husband in remission for multiple myeloma and doing well.          Social Determinants of Health   Financial Resource Strain: Low Risk  (07/30/2022)   Overall Financial Resource Strain (CARDIA)    Difficulty of Paying Living Expenses: Not hard at all  Food Insecurity: No Food Insecurity (07/30/2022)   Hunger Vital Sign    Worried About Running Out of Food in the Last Year: Never true    Ran Out of Food in the Last Year: Never true  Transportation Needs: No Transportation Needs (06/14/2021)   PRAPARE - Administrator, Civil Service (Medical): No    Lack of Transportation (Non-Medical): No  Physical Activity: Inactive (07/30/2022)   Exercise Vital Sign    Days of Exercise per Week: 0 days    Minutes of Exercise per Session: 0 min  Stress: No Stress Concern Present  (07/30/2022)   [employer] of Occupational Health - Occupational Stress Questionnaire    Feeling of Stress : Not at all  Social Connections: Moderately Integrated (07/30/2022)   Social Connection and Isolation Panel [NHANES]    Frequency of Communication with Friends and Family: Three times a week    Frequency of Social Gatherings with Friends and Family: Twice a week    Attends Religious Services: Never    Database administrator or Organizations: No    Attends Engineer, structural: 1 to 4 times per year    Marital Status: Married    Tobacco Counseling Counseling given: No   Clinical Intake:  Pre-visit preparation completed: No  Pain : No/denies pain     Nutritional Risks: None Diabetes: No  How often do you need to have someone help you when you read instructions, pamphlets, or other written materials from your doctor or pharmacy?: 1 - Never  Diabetic? No Interpreter Needed?: No  Information entered by :: Laurel Dimmer, CMA   Activities of Daily Living    07/30/2022   10:10 AM  In your present state of health, do you have any difficulty performing the following activities:  Hearing? 1  Vision? 1  Comment Dr. Wynelle Link  Difficulty concentrating or making decisions? 0  Walking or climbing stairs? 1  Dressing or bathing? 0  Doing errands, shopping? 0    Patient Care Team: Latrelle Dodrill, MD as PCP - General (Family Medicine) Glenford Peers, OD (Optometry) Iona Hansen, DDS (Dentistry) Bernette Redbird, MD as Consulting Physician (Gastroenterology) August Saucer Corrie Mckusick, MD as Consulting Physician (Orthopedic Surgery) Patton Salles, MD as Consulting Physician (Obstetrics and Gynecology) Carlus Pavlov, MD as Consulting Physician (Internal Medicine)  Indicate any recent Medical Services you may have received from other than [facility] providers in the past year (date may be approximate).     Assessment:  This is a routine wellness  examination for Jalissa.  Hearing/Vision screen Denies any hearing issues. Denies any vision changes, but admits to vision issues. Diagnosed with Glaucoma several years ago [employer] Exam, Dr. Wynelle Link.   Dietary issues and exercise activities discussed: Current Exercise Habits: The patient does not participate in regular exercise at present, Exercise limited by: orthopedic condition(s)   Goals Addressed   None    Depression Screen    07/30/2022   10:10 AM 01/04/2022    2:06 PM 11/06/2021   12:24 PM 07/04/2021   12:24 PM 06/14/2021    2:28 PM 06/14/2021    2:27 PM 11/08/2020   10:44 AM  PHQ 2/9 Scores  PHQ - 2 Score 0 0 0 0 0 0 0  PHQ- 9 Score  1 0 1   1    Fall Risk    07/30/2022   10:10 AM 01/04/2022    2:06 PM 06/14/2021    2:34 PM 11/08/2020   10:44 AM 02/17/2020   11:28 AM  Fall Risk   Falls in the past year? 0 0 0 0 0  Number falls in past yr: 0 0 0 0   Injury with Fall? 0 0 0 0   Risk for fall due to : No Fall Risks  Orthopedic patient  Impaired balance/gait  Follow up Falls evaluation completed        FALL RISK PREVENTION PERTAINING TO THE HOME:  Any stairs in or around the home? No  If so, are there any without handrails? No  Home free of loose throw rugs in walkways, pet beds, electrical cords, etc? Yes  Adequate lighting in your home to reduce risk of falls? Yes   ASSISTIVE DEVICES UTILIZED TO PREVENT FALLS:  Life alert? No  Use of a cane, walker or w/c? Yes  Grab bars in the bathroom? Yes  Shower chair or bench in shower? No  Elevated toilet seat or a handicapped toilet? No   TIMED UP AND GO:  Was the test performed? Unable to perform, virtual appointment   Cognitive Function:    07/09/2013    3:00 PM 10/23/2010   11:00 AM  MMSE - Mini Mental State Exam  Orientation to time 5 5  Orientation to Place 5 5  Registration 3 3  Attention/ Calculation 5 5  Recall 3 3  Language- name 2 objects 2 2  Language- repeat 1 1  Language- follow 3 step command 3 3   Language- read & follow direction 1 1  Write a sentence 1 1  Copy design 1 1  Total score 30 30        07/30/2022   10:15 AM 06/14/2021    2:35 PM 02/17/2020   11:33 AM  6CIT Screen  What Year? 0 points 0 points 0 points  What month? 0 points 0 points 0 points  What time? 0 points 0 points 0 points  Count back from 20 0 points 0 points 0 points  Months in reverse 0 points 0 points 0 points  Repeat phrase 0 points 0 points 0 points  Total Score 0 points 0 points 0 points    Immunizations Immunization History  Administered Date(s) Administered   Fluad Quad(high Dose 65+) 12/15/2018, 01/08/2020, 01/03/2021, 01/04/2022   Influenza Split 02/20/2011, 01/16/2012   Influenza Whole 01/17/2009, 01/10/2010   Influenza, High Dose Seasonal PF 01/01/2017, 01/17/2018   Influenza,inj,Quad PF,6+ Mos 12/26/2012, 12/21/2013, 01/06/2015, 01/05/2016   Influenza-Unspecified 12/25/2016   PFIZER(Purple Top)SARS-COV-2  Vaccination 04/22/2019, 05/13/2019, 01/28/2020, 09/07/2020   Pfizer Covid-19 Vaccine Bivalent Booster 42yrs & up 01/18/2021   Pneumococcal Conjugate-13 07/09/2013   Pneumococcal Polysaccharide-23 07/13/2008   Td 04/02/2004   Tdap 04/02/2006, 09/02/2017   Zoster Recombinat (Shingrix) 06/03/2020, 08/09/2020   Zoster, Live 04/02/2012    TDAP status: Up to date  Flu Vaccine status: Up to date  Pneumococcal vaccine status: Up to date  Covid-19 vaccine status: Information provided on how to obtain vaccines.   Qualifies for Shingles Vaccine? Yes   Zostavax completed Yes   Shingrix Completed?: Yes  Screening Tests Health Maintenance  Topic Date Due   COVID-19 Vaccine (6 - 2023-24 season) 12/01/2021   MAMMOGRAM  10/06/2022   INFLUENZA VACCINE  11/01/2022   Medicare Annual Wellness (AWV)  07/30/2023   DTaP/Tdap/Td (4 - Td or Tdap) 09/03/2027   Pneumonia Vaccine 65+ Years old  Completed   DEXA SCAN  Completed   Hepatitis C Screening  Completed   Zoster Vaccines- Shingrix   Completed   HPV VACCINES  Aged Out   COLONOSCOPY (Pts 45-53yrs Insurance coverage will need to be confirmed)  Discontinued    Health Maintenance  Health Maintenance Due  Topic Date Due   COVID-19 Vaccine (6 - 2023-24 season) 12/01/2021    Colorectal cancer screening: No longer required.   Mammogram status: No longer required due to age.    Lung Cancer Screening: (Low Dose CT Chest recommended if Age 92-80 years, 30 pack-year currently smoking OR have quit w/in 15years.) does not qualify.   Lung Cancer Screening Referral: not applicable   Additional Screening:  Hepatitis C Screening: does qualify; Completed 01/06/2015  Vision Screening: Recommended annual ophthalmology exams for early detection of glaucoma and other disorders of the eye. Is the patient up to date with their annual eye exam?  Yes  Who is the provider or what is the name of the office in which the patient attends annual eye exams? Dr. Wynelle Link If pt is not established with a provider, would they like to be referred to a provider to establish care? No .   Dental Screening: Recommended annual dental exams for proper oral hygiene  Community Resource Referral / Chronic Care Management: CRR required this visit?  No   CCM required this visit?  No      Plan:     I have personally reviewed and noted the following in the patient's chart:   Medical and social history Use of alcohol, tobacco or illicit drugs  Current medications and supplements including opioid prescriptions. Patient is not currently taking opioid prescriptions. Functional ability and status Nutritional status Physical activity Advanced directives List of other physicians Hospitalizations, surgeries, and ER visits in previous 12 months Vitals Screenings to include cognitive, depression, and falls Referrals and appointments  In addition, I have reviewed and discussed with patient certain preventive protocols, quality metrics, and best practice  recommendations. A written personalized care plan for preventive services as well as general preventive health recommendations were provided to patient.    Ms. Hare , Thank you for taking time to come for your Medicare Wellness Visit. I appreciate your ongoing commitment to your health goals. Please review the following plan we discussed and let me know if I can assist you in the future.   These are the goals we discussed:  Goals       Acknowledge receipt of Advanced Directive package     Patient reports having an advanced directive. We do not have copies. Bring paperwork  to be scanned into chart.      Exercise 3x per week (30 min per time)     Patient is currently walking one time per week for 20 minutes.  Encouraged to aim for 1-2 more times per week if the weather permits.         This is a list of the screening recommended for you and due dates:  Health Maintenance  Topic Date Due   COVID-19 Vaccine (6 - 2023-24 season) 12/01/2021   Mammogram  10/06/2022   Flu Shot  11/01/2022   Medicare Annual Wellness Visit  07/30/2023   DTaP/Tdap/Td vaccine (4 - Td or Tdap) 09/03/2027   Pneumonia Vaccine  Completed   DEXA scan (bone density measurement)  Completed   Hepatitis C Screening: USPSTF Recommendation to screen - Ages 63-79 yo.  Completed   Zoster (Shingles) Vaccine  Completed   HPV Vaccine  Aged Out   Colon Cancer Screening  Discontinued     Lonna Cobb, [facility]   07/30/2022   Nurse Notes: Approximately 30 minute Non-Face -To-Face Medicare Wellness Visit

## 2022-08-08 ENCOUNTER — Ambulatory Visit (INDEPENDENT_AMBULATORY_CARE_PROVIDER_SITE_OTHER): Payer: Medicare Other | Admitting: Family Medicine

## 2022-08-08 ENCOUNTER — Encounter: Payer: Self-pay | Admitting: Family Medicine

## 2022-08-08 VITALS — BP 148/81 | HR 52 | Ht 62.0 in | Wt 174.6 lb

## 2022-08-08 DIAGNOSIS — M542 Cervicalgia: Secondary | ICD-10-CM | POA: Diagnosis not present

## 2022-08-08 DIAGNOSIS — I10 Essential (primary) hypertension: Secondary | ICD-10-CM | POA: Diagnosis not present

## 2022-08-08 MED ORDER — LOSARTAN POTASSIUM 50 MG PO TABS: ORAL_TABLET | ORAL | 1 refills | Status: DC

## 2022-08-08 NOTE — Assessment & Plan Note (Signed)
Doing well on present dose of gabapentin 800mg  three times daily. Continue follow up with orthopedics. For now she is holding off of any surgeries. No changes to gabapentin & tylenol dosing today.

## 2022-08-08 NOTE — Assessment & Plan Note (Signed)
Above goal, likely due to not taking blood pressure medications yet today Schedule RN visit to recheck on a day she has taken medications

## 2022-08-08 NOTE — Patient Instructions (Signed)
It was great to see you again today.  Refilled your losartan. Schedule nurse visit to recheck blood pressure - make sure you take medications before that appointment.   Be well, Dr. Pollie Meyer

## 2022-08-08 NOTE — Progress Notes (Signed)
  Date of Visit: 08/08/2022   SUBJECTIVE:   HPI:  Laura Griffin presents today for follow-up.  Hypertension: Currently taking amlodipine 10 mg daily, losartan 50 mg daily, and metoprolol succinate 50 mg daily. Has not yet taken medications today. Needs refill of losartan.  Chronic neck pain: Currently taking gabapentin 800 mg 3 times a day. Doing well with this medication. Saw ortho, had MRI of cervical spine, was told she might eventually need surgery but she is hoping to hold off on this for as lon gas possible.  She is on aspirin 81 mg daily for primary prevention.  Has been under increased stress lately as her husband has CHF, has been hospitalized, and also has some memory issues making it challenging for him to take his medications regularly.  OBJECTIVE:   BP (!) 169/76   Pulse (!) 52   Ht 5\' 2"  (1.575 m)   Wt 174 lb 9.6 oz (79.2 kg)   LMP 04/02/1996   SpO2 100%   BMI 31.93 kg/m  Gen: no acute distress, pleasant, cooperative HEENT: normocephalic, atraumatic  Heart: regular rate and rhythm, no murmur Lungs: clear to auscultation bilaterally, normal work of breathing  Neuro: alert, grossly nonfocal, speech normal Ext: No appreciable lower extremity edema bilaterally   ASSESSMENT/PLAN:   Health maintenance:  -UTD on HM items  Neck pain Doing well on present dose of gabapentin 800mg  three times daily. Continue follow up with orthopedics. For now she is holding off of any surgeries. No changes to gabapentin & tylenol dosing today.  HYPERTENSION, BENIGN SYSTEMIC Above goal, likely due to not taking blood pressure medications yet today Schedule RN visit to recheck on a day she has taken medications   FOLLOW UP: Follow up for RN blood pressure check Follow up with me in 6 months, sooner if needed  [place] J. Pollie Meyer, MD [facility]

## 2022-08-17 DIAGNOSIS — I10 Essential (primary) hypertension: Secondary | ICD-10-CM | POA: Diagnosis not present

## 2022-08-17 DIAGNOSIS — H401133 Primary open-angle glaucoma, bilateral, severe stage: Secondary | ICD-10-CM | POA: Diagnosis not present

## 2022-08-17 DIAGNOSIS — H5010 Unspecified exotropia: Secondary | ICD-10-CM | POA: Diagnosis not present

## 2022-08-17 DIAGNOSIS — H53021 Refractive amblyopia, right eye: Secondary | ICD-10-CM | POA: Diagnosis not present

## 2022-08-21 ENCOUNTER — Ambulatory Visit: Payer: Medicare Other

## 2022-08-21 VITALS — BP 116/66 | HR 54

## 2022-08-21 DIAGNOSIS — I10 Essential (primary) hypertension: Secondary | ICD-10-CM

## 2022-08-21 NOTE — Progress Notes (Signed)
Patient here today for BP check.      Last BP was on 148/81 and was HR 52..  BP today is 116/66 with a pulse of 54.    Checked BP in left arm with large cuff.    Symptoms present: None.   Patient last took BP med Losartan yesterday afternoon.   Routed note to PCP.

## 2022-08-29 ENCOUNTER — Ambulatory Visit (INDEPENDENT_AMBULATORY_CARE_PROVIDER_SITE_OTHER): Payer: Medicare Other | Admitting: Orthopedic Surgery

## 2022-08-29 DIAGNOSIS — G549 Nerve root and plexus disorder, unspecified: Secondary | ICD-10-CM

## 2022-08-29 NOTE — Progress Notes (Signed)
Orthopedic Spine Surgery Office Note   Assessment: Patient is a 77 y.o. female with signs and symptoms consistent with progressive cervical spondylotic myelopathy.  Has central stenosis at C3/4 and C4/5.  She also has foraminal stenosis at multiple levels that would explain her radicular type pain.     Plan: -I went over again today in the office the natural history of myelopathy and recommended surgery because of the natural history.  I told her that some of her pain radiating to the arms is radicular in nature and surgery would help with the arm pain.  She is still not interested in surgery for fear of complication especially because of her age.  I told her that I will continue to monitor her in that case. -Patient should return to office in 12 weeks, x-rays at next visit: None     Patient expressed understanding of the plan and all questions were answered to the patient's satisfaction.    ___________________________________________________________________________     History:   Patient is a 77 y.o. female who has previously been seen in the office for symptoms of myeloradiculopathy.  She has pain that starts in her neck and radiates into the bilateral upper extremities.  It goes into the lateral aspect of the shoulders and into the lateral aspect of the arms.  She does not feel it radiating past the elbow on either side.  Pain is currently tolerable.  She has had issues with hand clumsiness and unsteadiness with gait.  She has not noticed any progression of those symptoms since the last time I saw her.  Denies paresthesias and numbness.   Treatments tried: Activity modification, cane use, Tylenol    Physical Exam:   General: no acute distress, appears stated age Neurologic: alert, answering questions appropriately, following commands Respiratory: unlabored breathing on room air, symmetric chest rise Psychiatric: appropriate affect, normal cadence to speech     MSK (spine):    -Strength exam                                                   Left                  Right Grip strength                5/5                  5/5 Interosseus                  5/5                  5/5 Wrist extension            5/5                  5/5 Wrist flexion                 5/5                  5/5 Elbow flexion                5/5                  5/5 Deltoid  5/5                  5/5   EHL                              4/5                  4/5 TA                                 5/5                  5/5 GSC                             5/5                  5/5 Knee extension            5/5                  5/5 Hip flexion                    4/5                  4/5   -Sensory exam                           Sensation intact to light touch in L3-S1 nerve distributions of bilateral lower extremities             Sensation intact to light touch in C5-T1 nerve distributions of bilateral upper extremities   -Brachioradialis DTR: 2/4 on the left, 2/4 on the right -Biceps DTR: 2/4 on the left, 2/4 on the right -Achilles DTR: 2/4 on the left, 2/4 on the right -Patellar tendon DTR: 2/4 on the left, 2/4 on the right   -Spurling: Negative bilaterally -Hoffman sign: Negative bilaterally -Clonus: No beats bilaterally -Interosseous wasting: None seen -Grip and release test: Positive -Romberg: Positive -Gait: Wide-based and uses cane -Imbalance with tandem gait: Yes   Imaging: XR of the cervical spine from 04/09/2022 and 05/30/2022 was previously independently reviewed and interpreted, showing multilevel spondylosis with disc height loss and anterior osteophyte formation.  Spondylolisthesis at C3/4 that shifts about 1 mm between flexion/extension views.  No fracture or dislocation seen.   MRI of the cervical spine from 05/02/2022 was previously independently reviewed and interpreted, showing central stenosis at C3 3/4 and C4/5.  Foraminal stenosis on the right at  C2/3, bilateral foraminal stenosis at C3/4, bilateral foraminal stenosis at C4/5, bilateral foraminal stenosis at C5/6, and bilateral foraminal stenosis at C6/7.  No T2 cord signal change seen.     Patient name: Laura Griffin Patient MRN: [id number] Date of visit: 08/29/22

## 2022-09-23 ENCOUNTER — Other Ambulatory Visit: Payer: Self-pay

## 2022-09-23 ENCOUNTER — Observation Stay (HOSPITAL_COMMUNITY): Payer: Medicare Other

## 2022-09-23 ENCOUNTER — Emergency Department (HOSPITAL_COMMUNITY): Payer: Medicare Other

## 2022-09-23 ENCOUNTER — Inpatient Hospital Stay (HOSPITAL_COMMUNITY)
Admission: EM | Admit: 2022-09-23 | Discharge: 2022-09-25 | DRG: 078 | Disposition: A | Discharge status: Home Health | Payer: Medicare Other | Attending: Family Medicine | Admitting: Family Medicine

## 2022-09-23 ENCOUNTER — Encounter (HOSPITAL_COMMUNITY): Payer: Self-pay

## 2022-09-23 DIAGNOSIS — E785 Hyperlipidemia, unspecified: Secondary | ICD-10-CM | POA: Diagnosis present

## 2022-09-23 DIAGNOSIS — E872 Acidosis, unspecified: Secondary | ICD-10-CM | POA: Diagnosis present

## 2022-09-23 DIAGNOSIS — E039 Hypothyroidism, unspecified: Secondary | ICD-10-CM | POA: Diagnosis present

## 2022-09-23 DIAGNOSIS — F32A Depression, unspecified: Secondary | ICD-10-CM | POA: Diagnosis present

## 2022-09-23 DIAGNOSIS — E162 Hypoglycemia, unspecified: Secondary | ICD-10-CM | POA: Diagnosis present

## 2022-09-23 DIAGNOSIS — R404 Transient alteration of awareness: Secondary | ICD-10-CM | POA: Diagnosis not present

## 2022-09-23 DIAGNOSIS — I10 Essential (primary) hypertension: Secondary | ICD-10-CM | POA: Diagnosis present

## 2022-09-23 DIAGNOSIS — K219 Gastro-esophageal reflux disease without esophagitis: Secondary | ICD-10-CM | POA: Diagnosis present

## 2022-09-23 DIAGNOSIS — Z9849 Cataract extraction status, unspecified eye: Secondary | ICD-10-CM | POA: Diagnosis not present

## 2022-09-23 DIAGNOSIS — R4182 Altered mental status, unspecified: Principal | ICD-10-CM | POA: Diagnosis present

## 2022-09-23 DIAGNOSIS — Z87891 Personal history of nicotine dependence: Secondary | ICD-10-CM

## 2022-09-23 DIAGNOSIS — E860 Dehydration: Secondary | ICD-10-CM | POA: Diagnosis present

## 2022-09-23 DIAGNOSIS — E876 Hypokalemia: Secondary | ICD-10-CM | POA: Diagnosis present

## 2022-09-23 DIAGNOSIS — I6782 Cerebral ischemia: Secondary | ICD-10-CM | POA: Diagnosis not present

## 2022-09-23 DIAGNOSIS — Z7982 Long term (current) use of aspirin: Secondary | ICD-10-CM | POA: Diagnosis not present

## 2022-09-23 DIAGNOSIS — I674 Hypertensive encephalopathy: Principal | ICD-10-CM | POA: Diagnosis present

## 2022-09-23 DIAGNOSIS — R000 Tachycardia, unspecified: Secondary | ICD-10-CM | POA: Diagnosis not present

## 2022-09-23 DIAGNOSIS — I161 Hypertensive emergency: Secondary | ICD-10-CM | POA: Diagnosis not present

## 2022-09-23 DIAGNOSIS — Z882 Allergy status to sulfonamides status: Secondary | ICD-10-CM

## 2022-09-23 DIAGNOSIS — G9389 Other specified disorders of brain: Secondary | ICD-10-CM | POA: Diagnosis not present

## 2022-09-23 DIAGNOSIS — Z79899 Other long term (current) drug therapy: Secondary | ICD-10-CM | POA: Diagnosis not present

## 2022-09-23 DIAGNOSIS — Z888 Allergy status to other drugs, medicaments and biological substances status: Secondary | ICD-10-CM | POA: Diagnosis not present

## 2022-09-23 DIAGNOSIS — Z0389 Encounter for observation for other suspected diseases and conditions ruled out: Secondary | ICD-10-CM | POA: Diagnosis not present

## 2022-09-23 DIAGNOSIS — R410 Disorientation, unspecified: Secondary | ICD-10-CM | POA: Diagnosis not present

## 2022-09-23 DIAGNOSIS — A419 Sepsis, unspecified organism: Secondary | ICD-10-CM | POA: Diagnosis not present

## 2022-09-23 LAB — TROPONIN I (HIGH SENSITIVITY)
Troponin I (High Sensitivity): 80 ng/L — ABNORMAL HIGH (ref <18)
Troponin I (High Sensitivity): 86 ng/L — ABNORMAL HIGH (ref <18)

## 2022-09-23 LAB — URINALYSIS, ROUTINE W REFLEX MICROSCOPIC
Bilirubin Urine: NEGATIVE
Glucose, UA: NEGATIVE mg/dL
Ketones, ur: 20 mg/dL — AB
Leukocytes,Ua: NEGATIVE
Nitrite: NEGATIVE
Protein, ur: >=300 mg/dL — AB
Specific Gravity, Urine: 1.014 (ref 1.005–1.030)
pH: 5 (ref 5.0–8.0)

## 2022-09-23 LAB — CBC WITH DIFFERENTIAL/PLATELET
Abs Immature Granulocytes: 0.04 10*3/uL (ref 0.00–0.07)
Basophils Absolute: 0 10*3/uL (ref 0.0–0.1)
Basophils Relative: 0 %
Eosinophils Absolute: 0 10*3/uL (ref 0.0–0.5)
Eosinophils Relative: 0 %
HCT: 42.9 % (ref 36.0–46.0)
Hemoglobin: 14 g/dL (ref 12.0–15.0)
Immature Granulocytes: 1 %
Lymphocytes Relative: 13 %
Lymphs Abs: 1.1 10*3/uL (ref 0.7–4.0)
MCH: 30.1 pg (ref 26.0–34.0)
MCHC: 32.6 g/dL (ref 30.0–36.0)
MCV: 92.3 fL (ref 80.0–100.0)
Monocytes Absolute: 0.6 10*3/uL (ref 0.1–1.0)
Monocytes Relative: 7 %
Neutro Abs: 6.7 10*3/uL (ref 1.7–7.7)
Neutrophils Relative %: 79 %
Platelets: 270 10*3/uL (ref 150–400)
RBC: 4.65 MIL/uL (ref 3.87–5.11)
RDW: 12.8 % (ref 11.5–15.5)
WBC: 8.5 10*3/uL (ref 4.0–10.5)
nRBC: 0 % (ref 0.0–0.2)

## 2022-09-23 LAB — CBC
HCT: 41.3 % (ref 36.0–46.0)
Hemoglobin: 13.5 g/dL (ref 12.0–15.0)
MCH: 30.4 pg (ref 26.0–34.0)
MCHC: 32.7 g/dL (ref 30.0–36.0)
MCV: 93 fL (ref 80.0–100.0)
Platelets: 238 10*3/uL (ref 150–400)
RBC: 4.44 MIL/uL (ref 3.87–5.11)
RDW: 12.7 % (ref 11.5–15.5)
WBC: 8.7 10*3/uL (ref 4.0–10.5)
nRBC: 0 % (ref 0.0–0.2)

## 2022-09-23 LAB — BASIC METABOLIC PANEL
Anion gap: 14 (ref 5–15)
Anion gap: 14 (ref 5–15)
BUN: 13 mg/dL (ref 8–23)
BUN: 13 mg/dL (ref 8–23)
CO2: 24 mmol/L (ref 22–32)
CO2: 24 mmol/L (ref 22–32)
Calcium: 10.7 mg/dL — ABNORMAL HIGH (ref 8.9–10.3)
Calcium: 10.5 mg/dL — ABNORMAL HIGH (ref 8.9–10.3)
Chloride: 101 mmol/L (ref 98–111)
Chloride: 98 mmol/L (ref 98–111)
Creatinine, Ser: 0.85 mg/dL (ref 0.44–1.00)
Creatinine, Ser: 0.88 mg/dL (ref 0.44–1.00)
GFR, Estimated: >60 mL/min (ref >60)
GFR, Estimated: >60 mL/min (ref >60)
Glucose, Bld: 117 mg/dL — ABNORMAL HIGH (ref 70–99)
Glucose, Bld: 113 mg/dL — ABNORMAL HIGH (ref 70–99)
Potassium: 3.3 mmol/L — ABNORMAL LOW (ref 3.5–5.1)
Potassium: 3.5 mmol/L (ref 3.5–5.1)
Sodium: 139 mmol/L (ref 135–145)
Sodium: 136 mmol/L (ref 135–145)

## 2022-09-23 LAB — COMPREHENSIVE METABOLIC PANEL
ALT: 23 U/L (ref 0–44)
AST: 43 U/L — ABNORMAL HIGH (ref 15–41)
Albumin: 4.4 g/dL (ref 3.5–5.0)
Alkaline Phosphatase: 60 U/L (ref 38–126)
Anion gap: 15 (ref 5–15)
BUN: 14 mg/dL (ref 8–23)
CO2: 23 mmol/L (ref 22–32)
Calcium: 11.2 mg/dL — ABNORMAL HIGH (ref 8.9–10.3)
Chloride: 101 mmol/L (ref 98–111)
Creatinine, Ser: 0.91 mg/dL (ref 0.44–1.00)
GFR, Estimated: >60 mL/min (ref >60)
Glucose, Bld: 128 mg/dL — ABNORMAL HIGH (ref 70–99)
Potassium: 3.3 mmol/L — ABNORMAL LOW (ref 3.5–5.1)
Sodium: 139 mmol/L (ref 135–145)
Total Bilirubin: 1 mg/dL (ref 0.3–1.2)
Total Protein: 9 g/dL — ABNORMAL HIGH (ref 6.5–8.1)

## 2022-09-23 LAB — GLUCOSE, CAPILLARY
Glucose-Capillary: 109 mg/dL — ABNORMAL HIGH (ref 70–99)
Glucose-Capillary: 102 mg/dL — ABNORMAL HIGH (ref 70–99)
Glucose-Capillary: 107 mg/dL — ABNORMAL HIGH (ref 70–99)
Glucose-Capillary: 94 mg/dL (ref 70–99)
Glucose-Capillary: 93 mg/dL (ref 70–99)

## 2022-09-23 LAB — I-STAT ARTERIAL BLOOD GAS, ED
Acid-Base Excess: 2 mmol/L (ref 0.0–2.0)
Bicarbonate: 26.1 mmol/L (ref 20.0–28.0)
Calcium, Ion: 1.35 mmol/L (ref 1.15–1.40)
HCT: 39 % (ref 36.0–46.0)
Hemoglobin: 13.3 g/dL (ref 12.0–15.0)
O2 Saturation: 96 %
Patient temperature: 97.6
Potassium: 3.3 mmol/L — ABNORMAL LOW (ref 3.5–5.1)
Sodium: 139 mmol/L (ref 135–145)
TCO2: 27 mmol/L (ref 22–32)
pCO2 arterial: 37.7 mmHg (ref 32–48)
pH, Arterial: 7.446 (ref 7.35–7.45)
pO2, Arterial: 73 mmHg — ABNORMAL LOW (ref 83–108)

## 2022-09-23 LAB — PROTIME-INR
INR: 1 (ref 0.8–1.2)
Prothrombin Time: 13.3 seconds (ref 11.4–15.2)

## 2022-09-23 LAB — LACTIC ACID, PLASMA
Lactic Acid, Venous: 2.1 mmol/L (ref 0.5–1.9)
Lactic Acid, Venous: 2.2 mmol/L (ref 0.5–1.9)

## 2022-09-23 LAB — URINE CULTURE

## 2022-09-23 LAB — CK: Total CK: 1044 U/L — ABNORMAL HIGH (ref 38–234)

## 2022-09-23 LAB — MRSA NEXT GEN BY PCR, NASAL: MRSA by PCR Next Gen: NOT DETECTED

## 2022-09-23 LAB — APTT: aPTT: 22 seconds — ABNORMAL LOW (ref 24–36)

## 2022-09-23 MED ORDER — POLYETHYLENE GLYCOL 3350 17 G PO PACK: 17.0000 g | PACK | Freq: Every day | ORAL | Status: DC | PRN

## 2022-09-23 MED ORDER — LOSARTAN POTASSIUM 50 MG PO TABS
50.0000 mg | ORAL_TABLET | Freq: Every day | ORAL | Status: DC
  Administered 2022-09-23 – 2022-09-24 (×2): 50 mg via ORAL
  Filled 2022-09-23 (×2): qty 1

## 2022-09-23 MED ORDER — AMLODIPINE BESYLATE 10 MG PO TABS
10.0000 mg | ORAL_TABLET | Freq: Every day | ORAL | Status: DC
  Administered 2022-09-23 – 2022-09-25 (×3): 10 mg via ORAL
  Filled 2022-09-23 (×3): qty 1

## 2022-09-23 MED ORDER — HYDRALAZINE HCL 20 MG/ML IJ SOLN: 10.0000 mg | Freq: Once | INTRAMUSCULAR | Status: DC

## 2022-09-23 MED ORDER — CHLORHEXIDINE GLUCONATE CLOTH 2 % EX PADS
6.0000 | MEDICATED_PAD | Freq: Every day | CUTANEOUS | Status: DC
  Administered 2022-09-23 – 2022-09-25 (×3): 6 via TOPICAL

## 2022-09-23 MED ORDER — LATANOPROST 0.005 % OP SOLN
1.0000 [drp] | Freq: Every day | OPHTHALMIC | Status: DC
  Administered 2022-09-23 – 2022-09-24 (×2): 1 [drp] via OPHTHALMIC
  Filled 2022-09-23: qty 2.5

## 2022-09-23 MED ORDER — TIMOLOL MALEATE 0.5 % OP SOLN
2.0000 [drp] | Freq: Two times a day (BID) | OPHTHALMIC | Status: DC
  Administered 2022-09-23 – 2022-09-25 (×5): 2 [drp] via OPHTHALMIC
  Filled 2022-09-23: qty 5

## 2022-09-23 MED ORDER — POTASSIUM CHLORIDE 10 MEQ/100ML IV SOLN
10.0000 meq | INTRAVENOUS | Status: AC
  Administered 2022-09-23 (×2): 10 meq via INTRAVENOUS
  Filled 2022-09-23 (×2): qty 100

## 2022-09-23 MED ORDER — DOCUSATE SODIUM 100 MG PO CAPS: 100.0000 mg | ORAL_CAPSULE | Freq: Two times a day (BID) | ORAL | Status: DC | PRN

## 2022-09-23 MED ORDER — LACTATED RINGERS IV BOLUS
1000.0000 mL | Freq: Once | INTRAVENOUS | Status: AC
  Administered 2022-09-23: 1000 mL via INTRAVENOUS

## 2022-09-23 MED ORDER — ONDANSETRON HCL 4 MG/2ML IJ SOLN: 4.0000 mg | Freq: Four times a day (QID) | INTRAMUSCULAR | Status: DC | PRN

## 2022-09-23 MED ORDER — VITAMIN D 25 MCG (1000 UNIT) PO TABS
2000.0000 [IU] | ORAL_TABLET | Freq: Every day | ORAL | Status: DC
  Administered 2022-09-23 – 2022-09-25 (×3): 2000 [IU] via ORAL
  Filled 2022-09-23 (×3): qty 2

## 2022-09-23 MED ORDER — BRIMONIDINE TARTRATE-TIMOLOL 0.2-0.5 % OP SOLN
2.0000 [drp] | Freq: Two times a day (BID) | OPHTHALMIC | Status: DC
  Filled 2022-09-23: qty 5

## 2022-09-23 MED ORDER — LACTATED RINGERS IV SOLN: INTRAVENOUS | Status: AC

## 2022-09-23 MED ORDER — ASPIRIN 81 MG PO TBEC
81.0000 mg | DELAYED_RELEASE_TABLET | Freq: Every day | ORAL | Status: DC
  Administered 2022-09-23 – 2022-09-25 (×3): 81 mg via ORAL
  Filled 2022-09-23 (×3): qty 1

## 2022-09-23 MED ORDER — CLEVIDIPINE BUTYRATE 0.5 MG/ML IV EMUL
0.0000 mg/h | INTRAVENOUS | Status: DC
  Administered 2022-09-23: 21 mg/h via INTRAVENOUS
  Administered 2022-09-23 (×2): 16 mg/h via INTRAVENOUS
  Administered 2022-09-23: 2 mg/h via INTRAVENOUS
  Administered 2022-09-23: 16 mg/h via INTRAVENOUS
  Administered 2022-09-24: 14 mg/h via INTRAVENOUS
  Administered 2022-09-24: 16 mg/h via INTRAVENOUS
  Filled 2022-09-23: qty 200
  Filled 2022-09-23 (×5): qty 100

## 2022-09-23 MED ORDER — ENOXAPARIN SODIUM 40 MG/0.4ML IJ SOSY
40.0000 mg | PREFILLED_SYRINGE | Freq: Every day | INTRAMUSCULAR | Status: DC
  Administered 2022-09-23 – 2022-09-25 (×3): 40 mg via SUBCUTANEOUS
  Filled 2022-09-23 (×3): qty 0.4

## 2022-09-23 MED ORDER — LACTATED RINGERS IV BOLUS (SEPSIS)
500.0000 mL | Freq: Once | INTRAVENOUS | Status: AC
  Administered 2022-09-23: 500 mL via INTRAVENOUS

## 2022-09-23 MED ORDER — ORAL CARE MOUTH RINSE: 15.0000 mL | OROMUCOSAL | Status: DC | PRN

## 2022-09-23 MED ORDER — ACETAMINOPHEN 325 MG PO TABS: 650.0000 mg | ORAL_TABLET | Freq: Four times a day (QID) | ORAL | Status: DC | PRN

## 2022-09-23 MED ORDER — BRIMONIDINE TARTRATE 0.2 % OP SOLN
2.0000 [drp] | Freq: Two times a day (BID) | OPHTHALMIC | Status: DC
  Administered 2022-09-23 – 2022-09-25 (×5): 2 [drp] via OPHTHALMIC
  Filled 2022-09-23: qty 5

## 2022-09-23 MED ORDER — METOPROLOL SUCCINATE ER 50 MG PO TB24
50.0000 mg | ORAL_TABLET | Freq: Every day | ORAL | Status: DC
  Administered 2022-09-23 – 2022-09-25 (×3): 50 mg via ORAL
  Filled 2022-09-23 (×3): qty 1

## 2022-09-23 NOTE — Assessment & Plan Note (Addendum)
Patient presents with altered mental status after being found down by her son. On arrival she is responsive to her name but answers questions inappropriately. Typically A&O x 4 per family. In ED BP significantly elevated to 198/107 and came down after Hydralazine to 168/115. Unclear cause of AMS, possibly stroke though CT without acute infarct. Will obtain MRI. LA elevated at 2.1. No source of infection as CXR and UA unremarkable though UA does show signs of dehydration with proteinuria and ketones. Unclear how long she was down for, will check CK. UDS pending. Will continue to try to contact family to get more information.  - Admit to [facility]PT[facility] progressive unit, attending Dr. Manson Passey - MRI -Follow-up blood and urine cultures - CK - UDS - ABG - LR 125 ml/hr - SLP eval - Vitals per floor - N.p.o. given altered - PT/OT eval once more stable

## 2022-09-23 NOTE — Assessment & Plan Note (Signed)
BP significantly elevated to 198/107 and came down after Hydralazine to 168/115. Per chart review typically takes Amlodipine 10 mg daily, Losartan 50 mg daily, Metoprolol succinate 50 mg daily - Given she is n.p.o. due to her altered status will hold her medication for now and continue IV hydralazine needed

## 2022-09-23 NOTE — Evaluation (Signed)
Clinical/Bedside Swallow Evaluation Patient Details  Name: Laura Griffin MRN: [id number] Date of Birth: 25-Jul-1945  Today's Date: 09/23/2022 Time: SLP Start Time (ACUTE ONLY): 1203 SLP Stop Time (ACUTE ONLY): 1216 SLP Time Calculation (min) (ACUTE ONLY): 13 min  Past Medical History:  Past Medical History:  Diagnosis Date   Allergic rhinitis 07/19/2014   Arthritis of knee, right 03/11/2006   Bursitis of shoulder, right 09/19/1999   Cervical radiculopathy at C6 05/24/2011   Beginning Nov 2012. Multi-level foraminal narrowing in C-spine plain films    Diverticulosis    DIVERTICULOSIS OF COLON 05/30/2006   Colonoscopy 06/2014 - multiple diverticula, no other abnormalities, no further colonoscopies recommended given patient's age of 28     GERD (gastroesophageal reflux disease) 07/19/2014   Diagnosed clinically by [facility] in January of 2016.     Glaucoma    Hearing decreased 08/07/2011   With whooshing tinnitus Mild bilateral loss on 08/2012 audiology who will recheck her hearing in one year     History of herniated intervertebral disc    Hypertension    HYPERTENSION, BENIGN SYSTEMIC 05/30/2006        Left sided sciatica 8/95,11/00   CT confirmed    LOW BACK PAIN SYNDROME 05/10/2009   Qualifier: Diagnosis of  By: Laura Griffin  Mild C 3-4, 4-5 lumbar spinal stenosis MRI 08/2011, Diffuse lower thoracic and lumbar DDD    Mitral valve prolapse    per patient heart murmur   Orthostatic hypotension 06/12/2013   Osteoarthritis    Osteoarthritis of right knee 12/26/2012   Confirmed on xray 07/2014    OSTEOARTHRITIS, MULTI SITES 05/30/2006   She is stoic about pain and wants to avoid surgeries Past xrays have documented DJD in right knee and lumbar spine     Overweight (BMI 25.0-29.9) 05/30/2006   Pitting edema 07/19/2014   Bilateral 1+ pitting edema of calves first noted on 07/19/2014.     Sciatica 05/30/2006   Qualifier: Diagnosis of  By: Laura Griffin  Involved left leg in  02/1999    Swallowing difficulty 07/06/2013   Urge incontinence 06/01/2008   Qualifier: Diagnosis of  By: Laura Griffin     Past Surgical History:  Past Surgical History:  Procedure Laterality Date   CATARACT EXTRACTION  02/2011   left eye   TUBAL LIGATION  1973   VULVA /PERINEUM BIOPSY  2010   for hypopigmentation, non-specific result   HPI:  Pt is a 77 y.o. female who presented with AMS. Dx  hypertensive encephalopathy. CT head and CXR negative. PMH: hypertension, HLD, GERD, DDD osteoarthritis    Assessment / Plan / Recommendation  Clinical Impression  Pt was seen for bedside swallow evaluation with her son present who denied the pt having a history of oropharyngeal dysphagia. Oral mechanism exam was Laura Griffin and her natural dentition was adequate. She presented with reduced bolus awareness and prolonged mastication, but her swallow mechanism otherwise appeared Laura Griffin. A dysphagia 3 diet with thin liquids is recommended at this time and SLP will follow pt with plan for diet advancement as clinically indicated. SLP Visit Diagnosis: Dysphagia, unspecified (R13.10)    Aspiration Risk  Mild aspiration risk    Diet Recommendation Dysphagia 3 (Mech soft);Thin liquid   Liquid Administration via: Cup;Straw Medication Administration: Whole meds with puree (or with liquid) Supervision: Staff to assist with self feeding Compensations: Slow rate;Minimize environmental distractions Postural Changes: Seated upright at 90 degrees    Other  Recommendations Oral Care Recommendations: Oral  care BID    Recommendations for follow up therapy are one component of a multi-disciplinary discharge planning process, led by the attending physician.  Recommendations may be updated based on patient status, additional functional criteria and insurance authorization.  Follow up Recommendations  (TBD)      Assistance Recommended at Discharge    Functional Status Assessment Patient has had a recent decline in their  functional status and demonstrates the ability to make significant improvements in function in a reasonable and predictable amount of time.  Frequency and Duration min 2x/week  1 week       Prognosis Prognosis for improved oropharyngeal function: Good      Swallow Study   General Date of Onset: 09/23/22 HPI: Pt is a 77 y.o. female who presented with AMS. Dx  hypertensive encephalopathy. CT head and CXR negative. PMH: hypertension, HLD, GERD, DDD osteoarthritis Type of Study: Bedside Swallow Evaluation Previous Swallow Assessment: none Diet Prior to this Study: NPO Temperature Spikes Noted: No Respiratory Status: Room air History of Recent Intubation: No Behavior/Cognition: Alert;Cooperative Oral Cavity Assessment: Within Functional Limits Oral Care Completed by SLP: No Oral Cavity - Dentition: Adequate natural dentition Vision: Functional for self-feeding Self-Feeding Abilities: Able to feed self Patient Positioning: Upright in bed Baseline Vocal Quality: Normal Volitional Swallow: Able to elicit    Oral/Motor/Sensory Function Overall Oral Motor/Sensory Function: Within functional limits   Ice Chips Ice chips: Within functional limits Presentation: Spoon   Thin Liquid Thin Liquid: Within functional limits Presentation: Straw    Nectar Thick Nectar Thick Liquid: Not tested   Honey Thick Honey Thick Liquid: Not tested   Puree Puree: Within functional limits Presentation: Spoon   Solid     Solid: Impaired Oral Phase Impairments: Poor awareness of bolus;Impaired mastication Oral Phase Functional Implications: Impaired mastication     Laura Griffin I. Vear Clock, MS, [facility]  Acute Rehabilitation Services Office number: [phone]  Laura Griffin 09/23/2022,12:39 PM

## 2022-09-23 NOTE — ED Triage Notes (Signed)
Son states he felt like she has been slightly altered from basline of A&Ox4, medic reports and A&Ox 2, found on floor by son beside the bed, Medic reports the smell of foul urine at the scene and on the patient. No other injuries or complaints have been mentioned by patient or medic.   Medic vitals   206/110 104hr 134 bgl 20rr  18g left A/C

## 2022-09-23 NOTE — H&P (Signed)
Hospital Admission History and Physical Service Pager: [phone]  Patient name: Laura Griffin Medical record number: [id number] Date of Birth: 02/08/1946 Age: 77 y.o. Gender: female  Primary Care Provider: Latrelle Dodrill, MD Consultants: None Code Status: Full code per son Preferred Emergency Contact:   Contact Information     Name Relation Home Work Mobile   Laura Griffin Spouse [phone]   Trudee, Chirino [phone]         Chief Complaint: AMS  Assessment and Plan: Laura Griffin is a 77 y.o. female presenting with AMS . Differential for presentation of this is broad but some causes considered include infection (though UA and chest x-ray were unremarkable), CVA (CT negative),  hypertensive emergency, polypharmacy, drug overdose though no known substance use, hypo or hyperglycemia though glucose was normal. PMH significant for hypertension, HLD, GERD, DDD osteoarthritis  AMS Patient presents with altered mental status after being found down by her son. On arrival she is responsive to her name but answers questions inappropriately. Typically A&O x 4 per family. In ED BP significantly elevated to 198/107 and came down after Hydralazine to 168/115. Unclear cause of AMS, possibly stroke though CT without acute infarct. Will obtain MRI. LA elevated at 2.1. No source of infection as CXR and UA unremarkable though UA does show signs of dehydration with proteinuria and ketones. Unclear how long she was down for, will check CK. UDS pending. Will continue to try to contact family to get more information.  -Admit to FPTS progressive unit, attending Dr. Manson Griffin - MRI -Follow-up blood and urine cultures - CK - UDS - ABG - LR 125 ml/hr -SLP eval - Vitals per floor - N.p.o. given altered - PT/OT eval once more stable  HTN BP significantly elevated to 198/107 and came down after Hydralazine to 168/115. Per chart review  typically takes Amlodipine 10 mg daily, Losartan 50 mg daily, Metoprolol succinate 50 mg daily - Given she is n.p.o. due to her altered status will hold her medication for now and continue IV hydralazine needed  Hypokalemia K+ 3.3 - Potassium chloride 30 mEq IV -Monitor with a.m. BMP   FEN/GI: npo given AMS  VTE Prophylaxis: Lovenox   Disposition: Progressive   History of Present Illness:  Laura Griffin is a 77 y.o. female presenting with AMS  Patient unable to answer any questions. Will mumble different letters such as "AEF"  Spoke to son Laura Griffin over the phone . He states he came to the house when he couldn't get a hold of her. Spoke to her yesterday evening and didn't feel she sounded completely normal. Felt she was speaking slowly and seemed very tired. The day prior she was at her baseline. Was with her grandchildren yesterday afternoon.  Lives with husband Laura Griffin whom son states has memory problems [phone]. When I called he did not pick up. Will try again later this morning   In the ED, she was altered and responds to her name but inappropriately.  CT head without acute intracranial abnormality, did show cerebral atrophy with chronic white matter small vessel ischemic changes and severe left ethmoid sinus and sphenoid sinus disease  CXR unremarkable  UA with moderate hemoglobin, 20 ketones, greater than 300 protein Lactic acid 2.1 Was given 1.5 LR bolus   Review Of Systems: Unable to obtain   Pertinent Past Medical History: Hypertension HLD GERD DDD Osteoarthritis  Pertinent Past Surgical History:  Per chart because unable to obtain from patient, cataract  surgery, tubal ligation  Pertinent Social History: Tobacco use: Former (per chart) Alcohol use: No (per chart) Other Substance use: No (per chart) Lives with husband  Pertinent Family History:  Could not ask  Important Outpatient Medications:  Gabapentin 800 mg 3 times daily Aspirin 81  mg daily Amlodipine 10 mg daily Losartan 50 mg daily Metoprolol succinate 50 mg daily  Objective: BP (!) 212/118   Pulse (!) 106   Temp 97.6 F (36.4 C)   Resp 17   LMP 04/02/1996   SpO2 97%  Exam: General: eyes closed laying in bed, opens and responds to her name. NAD Eyes: Conjunctiva non erythematous  ENTM: no abnormalities observed  Neck: normal Cardiovascular: RRR no murmurs  Respiratory: CTAB normal WOB Gastrointestinal: soft, non distended  MSK: moves extremities spontaenously  Derm: warm, dry Neuro: Disoriented. Unable to perform neuro exam  Psych: unable to obtain   Labs:  CBC BMET  Recent Labs  Lab 09/23/22 0116  WBC 8.5  HGB 14.0  HCT 42.9  PLT 270   Recent Labs  Lab 09/23/22 0116  NA 139  K 3.3*  CL 101  CO2 23  BUN 14  CREATININE 0.91  GLUCOSE 128*  CALCIUM 11.2*      EKG: sinus tachycardia. Anterior q wave   Imaging Studies Performed:  CT HEAD WO CONTRAST ( ) 1. Generalized cerebral atrophy with chronic white matter small vessel ischemic changes. 2. No acute intracranial abnormality. 3. Marked severity left ethmoid sinus and sphenoid sinus disease.   CXR No active disease.   Laura Collum, DO 09/23/2022, 5:45 AM PGY-3, [facility]  FPTS Intern pager: [phone], text pages welcome Secure chat group [facility] Teaching Service

## 2022-09-23 NOTE — Progress Notes (Signed)
PT Cancellation Note  Patient Details Name: Laura Griffin MRN: [id number] DOB: 10-02-1945   Cancelled Treatment:    Reason Eval/Treat Not Completed: Patient not medically ready. Hypertensive. Awaiting MRI. PT to follow up tomorrow.   Ilda Foil 09/23/2022, 10:10 AM

## 2022-09-23 NOTE — ED Provider Notes (Signed)
[place] EMERGENCY DEPARTMENT AT [facility] Provider Note   CSN: [id number] Arrival date & time: 09/23/22  0028     History  Chief Complaint  Patient presents with   Urinary Tract Infection    Laura Griffin is a 77 y.o. female who presents via EMS with concern for altered mental status.  According to EMS family said they found her down in her room with unwitnessed fall, halfway under the bed.  Altered mental status when reported baseline is usually no x 4.  Foul-smelling urine on scene and the patient.  Patient unable to answer any questions but does respond to her name.  Level 5 caveat due to acuity presentation upon arrival.  I personally reviewed medical records previous history of hypertension, GERD, DDD.  On losartan and metoprolol as well as amlodipine for blood pressure.  HPI     Home Medications Prior to Admission medications   Medication Sig Start Date End Date Taking? Authorizing Provider  acetaminophen (TYLENOL) 650 MG CR tablet Take 1,300 mg by mouth 3 (three) times daily.    [provider]  amLODipine (NORVASC) 10 MG tablet TAKE 1 TABLET(10 MG) BY MOUTH AT BEDTIME 06/26/22   Latrelle Dodrill, MD  aspirin EC 81 MG tablet Take 81 mg by mouth daily.    [provider]  brimonidine-timolol (COMBIGAN) 0.2-0.5 % ophthalmic solution Place 2 drops into both eyes every 12 (twelve) hours.    [provider]  Cholecalciferol 25 MCG (1000 UT) capsule Take 2,000 Units by mouth daily.    [provider]  gabapentin (NEURONTIN) 400 MG capsule TAKE 2 CAPSULES(800 MG) BY MOUTH THREE TIMES DAILY 03/20/22   Latrelle Dodrill, MD  latanoprost (XALATAN) 0.005 % ophthalmic solution PLACE 1 DROP INTO BOTH EYES EVERY EVENING/BEDTIME 08/15/18   [provider]  losartan (COZAAR) 50 MG tablet TAKE 1 TABLET BY MOUTH DAILY FOR BLOOD PRESSURE 08/08/22   Latrelle Dodrill, MD  metoprolol succinate (TOPROL-XL) 50 MG 24 hr tablet  TAKE 1 TABLET(50 MG) BY MOUTH AT BEDTIME WITH OR IMMEDIATELY FOLLOWING A MEAL 01/30/22   Latrelle Dodrill, MD  Omega-3 Fatty Acids (FISH OIL PO) Take by mouth.    [provider]      Allergies    Sulfamethoxazole-trimethoprim and Hctz [hydrochlorothiazide]    Review of Systems   Review of Systems  Unable to perform ROS: Mental status change    Physical Exam Updated Vital Signs BP (!) 168/115   Pulse 88   Temp 97.7 F (36.5 C) (Oral)   Resp 18   LMP 04/02/1996   SpO2 96%  Physical Exam Vitals and nursing note reviewed.  Constitutional:      Appearance: She is obese. She is ill-appearing. She is not toxic-appearing.     Comments: Foul-smell of urine.  HENT:     Head: Normocephalic and atraumatic.     Mouth/Throat:     Mouth: Mucous membranes are moist.     Pharynx: No oropharyngeal exudate or posterior oropharyngeal erythema.  Eyes:     General:        Right eye: No discharge.        Left eye: No discharge.     Conjunctiva/sclera: Conjunctivae normal.  Cardiovascular:     Rate and Rhythm: Regular rhythm. Tachycardia present.     Pulses: Normal pulses.     Heart sounds: Normal heart sounds. No murmur heard. Pulmonary:     Effort: Pulmonary effort is normal. No  respiratory distress.     Breath sounds: Normal breath sounds. No wheezing or rales.  Abdominal:     General: Bowel sounds are normal. There is no distension.     Palpations: Abdomen is soft.     Tenderness: There is no abdominal tenderness. There is no guarding or rebound.  Musculoskeletal:        General: No deformity.     Cervical back: Neck supple.     Right lower leg: No edema.     Left lower leg: No edema.  Skin:    General: Skin is warm and dry.     Capillary Refill: Capillary refill takes less than 2 seconds.  Neurological:     Mental Status: She is alert. She is disoriented.     GCS: GCS eye subscore is 2. GCS verbal subscore is 3. GCS motor subscore is 5.     Cranial Nerves: Cranial  nerves 2-12 are intact.     Sensory: Sensation is intact.     Motor: Motor function is intact.     Comments: Moving all extremities spontaneously and without difficulty.   Psychiatric:        Mood and Affect: Mood normal.     ED Results / Procedures / Treatments   Labs (all labs ordered are listed, but only abnormal results are displayed) Labs Reviewed  LACTIC ACID, PLASMA - Abnormal; Notable for the following components:      Result Value   Lactic Acid, Venous 2.1 (*)    All other components within normal limits  LACTIC ACID, PLASMA - Abnormal; Notable for the following components:   Lactic Acid, Venous 2.2 (*)    All other components within normal limits  COMPREHENSIVE METABOLIC PANEL - Abnormal; Notable for the following components:   Potassium 3.3 (*)    Glucose, Bld 128 (*)    Calcium 11.2 (*)    Total Protein 9.0 (*)    AST 43 (*)    All other components within normal limits  APTT - Abnormal; Notable for the following components:   aPTT 22 (*)    All other components within normal limits  URINALYSIS, ROUTINE W REFLEX MICROSCOPIC - Abnormal; Notable for the following components:   Hgb urine dipstick MODERATE (*)    Ketones, ur 20 (*)    Protein, ur >=300 (*)    Bacteria, UA RARE (*)    All other components within normal limits  CK - Abnormal; Notable for the following components:   Total CK 1,044 (*)    All other components within normal limits  CULTURE, BLOOD (ROUTINE X 2)  CULTURE, BLOOD (ROUTINE X 2)  URINE CULTURE  CBC WITH DIFFERENTIAL/PLATELET  PROTIME-INR  RAPID URINE DRUG SCREEN, HOSP PERFORMED  BASIC METABOLIC PANEL    EKG EKG Interpretation  Date/Time:  Sunday September 23 2022 00:47:39 EDT Ventricular Rate:  107 PR Interval:  170 QRS Duration: 85 QT Interval:  317 QTC Calculation: 423 R Axis:   -29 Text Interpretation: Sinus tachycardia LAE, consider biatrial enlargement RSR' in V1 or V2, probably normal variant Left ventricular hypertrophy  Anterior Q waves, possibly due to LVH Since last tracing rate faster Otherwise no significant change Confirmed by Melene Plan [phone]) on 09/23/2022 3:37:07 AM  Radiology CT HEAD WO CONTRAST ( )  Result Date: 09/23/2022 CLINICAL DATA:  Delirium. EXAM: CT HEAD WITHOUT CONTRAST TECHNIQUE: Contiguous axial images were obtained from the base of the skull through the vertex without intravenous contrast. RADIATION DOSE REDUCTION: This exam was  performed according to the departmental dose-optimization program which includes automated exposure control, adjustment of the mA and/or kV according to patient size and/or use of iterative reconstruction technique. COMPARISON:  None Available. FINDINGS: Brain: There is moderate severity cerebral atrophy with widening of the extra-axial spaces and ventricular dilatation. This is asymmetrically prominent within the posterior parietal region on the right There are areas of decreased attenuation within the white matter tracts of the supratentorial brain, consistent with microvascular disease changes. Vascular: No hyperdense vessel or unexpected calcification. Skull: Normal. Negative for fracture or focal lesion. Sinuses/Orbits: There is marked severity left ethmoid sinus and sphenoid sinus mucosal thickening. Other: None. IMPRESSION: 1. Generalized cerebral atrophy with chronic white matter small vessel ischemic changes. 2. No acute intracranial abnormality. 3. Marked severity left ethmoid sinus and sphenoid sinus disease. Electronically Signed   By: Aram Candela M.D.   On: 09/23/2022 03:20   DG Chest Port 1 View  Result Date: 09/23/2022 CLINICAL DATA:  Sepsis EXAM: PORTABLE CHEST 1 VIEW COMPARISON:  01/06/2015 FINDINGS: The heart size and mediastinal contours are within normal limits. Both lungs are clear. The visualized skeletal structures are unremarkable. IMPRESSION: No active disease. Electronically Signed   By: Deatra Robinson M.D.   On: 09/23/2022 01:43     Procedures .Critical Care  Performed by: Paris Lore, PA-C Authorized by: Paris Lore, PA-C   Critical care provider statement:    Critical care time (minutes):  45   Critical care was time spent personally by me on the following activities:  Development of treatment plan with patient or surrogate, discussions with consultants, evaluation of patient's response to treatment, examination of patient, obtaining history from patient or surrogate, ordering and performing treatments and interventions, ordering and review of laboratory studies, ordering and review of radiographic studies, pulse oximetry and re-evaluation of patient's condition     Medications Ordered in ED Medications  enoxaparin (LOVENOX) injection 40 mg (has no administration in time range)  lactated ringers infusion (has no administration in time range)  potassium chloride 10 mEq in 100 mL IVPB (has no administration in time range)  lactated ringers bolus 500 mL (0 mLs Intravenous Stopped 09/23/22 0158)  lactated ringers bolus 1,000 mL (0 mLs Intravenous Stopped 09/23/22 0500)    ED Course/ Medical Decision Making/ A&P                             Medical Decision Making 77 year old presents with concern for altered mental status.  Exquisitely hypertensive on intake, mildly tachycardic.  Altered mental status GCS of 10, alert and oriented only to herself.  Cardiopulmonary exam with mild tachycardia but otherwise unremarkable.  Abdominal exam is benign.  Patient without urine soaked pants.   The differential diagnosis for AMS is extensive and includes, but is not limited to:  Drug overdose - opioids, alcohol, sedatives, antipsychotics, drug withdrawal, others Metabolic: hypoxia, hypoglycemia, hyperglycemia, hypercalcemia, hypernatremia, hyponatremia, uremia, hepatic encephalopathy, hypothyroidism, hyperthyroidism, vitamin B12 or thiamine deficiency, carbon monoxide poisoning, Wilson's disease, Lactic  acidosis, DKA/HHOS Infectious: meningitis, encephalitis, bacteremia/sepsis, urinary tract infection, pneumonia, neurosyphilis Structural: Space-occupying lesion, (brain tumor, subdural hematoma, hydrocephalus,) Vascular: stroke, subarachnoid hemorrhage, coronary ischemia, hypertensive encephalopathy, CNS vasculitis, thrombotic thrombocytopenic purpura, disseminated intravascular coagulation, hyperviscosity Psychiatric: Schizophrenia, depression; Other: Seizure, hypothermia, heat stroke, ICU psychosis, dementia -"sundowning."   Amount and/or Complexity of Data Reviewed Labs: ordered.    Details: CBC unremarkable CMP with hypokalemia of 3.3 INR is normal.  UA not  convincing for infection.  Lactic acid elevated to 2.1 subsequently 2.2 despite 1500 cc fluid bolus.  CK elevated to 1044. Radiology: ordered.    Details:   CT head negative for acute intracranial abnormality.  Chest x-ray negative for acute cardiopulmonary disease.  ECG/medicine tests: ordered.    Details:  EKG with sinus tach.    Risk Decision regarding hospitalization.   Clinical picture not convincing for infectious etiology though this cannot be ruled out at this time.  There is also concern for CVA, therefore we will continue to allow permissive hypertension pending MRI to be completed on the inpatient side.  Patient will require admission regardless for persistent altered mental status.  Despite multiple times unable to contact patient's family via telephone throughout her stay in the ED.  Consult to family medicine resident Dr. Idalia Needle who is agreeable to admitting this patient to her service.  I appreciate her collaboration in the care of this patient.  This chart was dictated using voice recognition software, Dragon. Despite the best efforts of this provider to proofread and correct errors, errors may still occur which can change documentation meaning.   Final Clinical Impression(s) / ED Diagnoses Final diagnoses:   Altered mental status, unspecified altered mental status type    Rx / DC Orders ED Discharge Orders     None         Paris Lore, PA-C 09/23/22 0526    Melene Plan, DO 09/23/22 [phone]

## 2022-09-23 NOTE — Progress Notes (Signed)
NAME:  Laura Griffin, MRN:  [id number], DOB:  01-15-1946, LOS: 0 ADMISSION DATE:  09/23/2022, CONSULTATION DATE: 09/23/2022 REFERRING MD:  Lelon Mast, CHIEF COMPLAINT: Hypertensive emergency  History of Present Illness:  Patient is currently confused so history was obtained from her medical record, including the excellent admission note from [facility]. Per the notes family states that she did not sound completely normal yesterday and seems tired.  Family came to check on her and found that she was confused.  Brought to the ED and initially admitted to family practice.  Extensive workup was unremarkable: CT head showed chronic small vessel changes.  She had purulent urinary urine but no signs of infection.  Mild lactic acidosis.  Diagnosis was that this represented hypertensive emergency and/or medication adverse effects. Blood pressure rose to over 200 systolic and she was unable to take oral medications so critical care was consulted for ICU admission.  Pertinent  Medical History   Past Medical History:  Diagnosis Date   Allergic rhinitis 07/19/2014   Arthritis of knee, right 03/11/2006   Bursitis of shoulder, right 09/19/1999   Cervical radiculopathy at C6 05/24/2011   Beginning Nov 2012. Multi-level foraminal narrowing in C-spine plain films    Diverticulosis    DIVERTICULOSIS OF COLON 05/30/2006   Colonoscopy 06/2014 - multiple diverticula, no other abnormalities, no further colonoscopies recommended given patient's age of 24     GERD (gastroesophageal reflux disease) 07/19/2014   Diagnosed clinically by [facility] in January of 2016.     Glaucoma    Hearing decreased 08/07/2011   With whooshing tinnitus Mild bilateral loss on 08/2012 audiology who will recheck her hearing in one year     History of herniated intervertebral disc    Hypertension    HYPERTENSION, BENIGN SYSTEMIC 05/30/2006        Left sided sciatica 8/95,11/00   CT confirmed    LOW BACK PAIN SYNDROME 05/10/2009    Qualifier: Diagnosis of  By: Lorenda Hatchet CMA,, [place]  Mild C 3-4, 4-5 lumbar spinal stenosis MRI 08/2011, Diffuse lower thoracic and lumbar DDD    Mitral valve prolapse    per patient heart murmur   Orthostatic hypotension 06/12/2013   Osteoarthritis    Osteoarthritis of right knee 12/26/2012   Confirmed on xray 07/2014    OSTEOARTHRITIS, MULTI SITES 05/30/2006   She is stoic about pain and wants to avoid surgeries Past xrays have documented DJD in right knee and lumbar spine     Overweight (BMI 25.0-29.9) 05/30/2006   Pitting edema 07/19/2014   Bilateral 1+ pitting edema of calves first noted on 07/19/2014.     Sciatica 05/30/2006   Qualifier: Diagnosis of  By: Sheffield Slider MD, [place]  Involved left leg in 02/1999    Swallowing difficulty 07/06/2013   Urge incontinence 06/01/2008   Qualifier: Diagnosis of  By: Sheffield Slider MD, [facility] Events: Including procedures, antibiotic start and stop dates in addition to other pertinent events   6/23 initially admitted to family practice but transferred to the ICU when unable to take oral blood pressure medication.  Interim History / Subjective:  Blood pressure has improved spontaneously.  Patient is now speaking appropriately but disoriented.  Objective   Blood pressure (!) 202/124, pulse (!) 123, temperature 99.9 F (37.7 C), temperature source Axillary, resp. rate (!) 26, last menstrual period 04/02/1996, SpO2 99 %.       No intake or output data in the 24 hours ending 09/23/22 0910  There were no vitals filed for this visit.  Examination: General: Overweight woman appearing stated age.  In no distress. HENT: No scleral icterus.  Mucous membranes are moist. Lungs: Clear to auscultation bilaterally. Cardiovascular: No edema, no JVD, heart sounds normal.  Extremities warm well-perfused. Abdomen: Soft and nontender. Extremities: Osteoarthritis.  No joint deformities. Neuro: Calm.  Awake.  Not oriented to place.  No insight.  Speech is fluent.   No cranial nerve deficits, no focal deficits.  Some inattention during examination GU: Deferred.  Ancillary tests personally reviewed  Mild hypoxia on ABG.  Normal pCO2 Mild hypokalemia 3.3 Creatinine normal 0.85 Mild lactate elevation 2.2 Nominal troponin elevation 86 Normal CBC  Assessment & Plan:  Critically ill due to hypertensive encephalopathy requiring rapid control of blood pressure.  No apparent intercurrent illnesses no clear precipitants. Essential hypertension  Plan:  -MRI to rule out press.  No evidence of LVO at this time. -Symptoms have improved.  If becomes more confused again will consider EEG to rule out nonconvulsive status. -Titrate Cleviprex to keep systolic blood pressure 150-170. -Resume home oral medications.   Daily Goals Checklist  Pain/Anxiety/Delirium protocol (if indicated): None required VAP protocol (if indicated): Not applicable Respiratory support goals: Currently on room air Blood pressure target: Cleviprex for systolic blood pressure 150-170 DVT prophylaxis: Lovenox Nutrition Status: Oral intake GI prophylaxis: Not applicable Fluid status goals: Allow autoregulation Urinary catheter:  None required Central lines: PIV's only Glucose control: Sliding scale Mobility/therapy needs: Progressive mobilization Antibiotic de-escalation: No antibiotics Restraints: Restraint type:  Not required Reason for restraints: Interference with medical treatment Home medication reconciliation: Resume home antihypertensive medications Daily labs: BMP Code Status: Full Family Communication: Will update Disposition: ICU  CRITICAL CARE Performed by: Lynnell Catalan   Total critical care time: 35 minutes  Critical care time was exclusive of separately billable procedures and treating other patients.  Critical care was necessary to treat or prevent imminent or life-threatening deterioration.  Critical care was time spent personally by me on the following  activities: development of treatment plan with patient and/or surrogate as well as nursing, discussions with consultants, evaluation of patient's response to treatment, examination of patient, obtaining history from patient or surrogate, ordering and performing treatments and interventions, ordering and review of laboratory studies, ordering and review of radiographic studies, pulse oximetry, re-evaluation of patient's condition and participation in multidisciplinary rounds.  Lynnell Catalan, MD [facility] ICU Physician [facility] [place] [facility]  Pager: [phone] Mobile: [phone] After hours: [phone].

## 2022-09-23 NOTE — Assessment & Plan Note (Signed)
K+ 3.3 - Potassium chloride 30 mEq IV -Monitor with a.m. BMP

## 2022-09-24 DIAGNOSIS — Z9849 Cataract extraction status, unspecified eye: Secondary | ICD-10-CM | POA: Diagnosis not present

## 2022-09-24 DIAGNOSIS — E860 Dehydration: Secondary | ICD-10-CM | POA: Diagnosis present

## 2022-09-24 DIAGNOSIS — K219 Gastro-esophageal reflux disease without esophagitis: Secondary | ICD-10-CM | POA: Diagnosis present

## 2022-09-24 DIAGNOSIS — E872 Acidosis, unspecified: Secondary | ICD-10-CM | POA: Diagnosis present

## 2022-09-24 DIAGNOSIS — Z79899 Other long term (current) drug therapy: Secondary | ICD-10-CM | POA: Diagnosis not present

## 2022-09-24 DIAGNOSIS — Z882 Allergy status to sulfonamides status: Secondary | ICD-10-CM | POA: Diagnosis not present

## 2022-09-24 DIAGNOSIS — E785 Hyperlipidemia, unspecified: Secondary | ICD-10-CM | POA: Diagnosis present

## 2022-09-24 DIAGNOSIS — Z888 Allergy status to other drugs, medicaments and biological substances status: Secondary | ICD-10-CM | POA: Diagnosis not present

## 2022-09-24 DIAGNOSIS — Z87891 Personal history of nicotine dependence: Secondary | ICD-10-CM | POA: Diagnosis not present

## 2022-09-24 DIAGNOSIS — I674 Hypertensive encephalopathy: Secondary | ICD-10-CM | POA: Diagnosis present

## 2022-09-24 DIAGNOSIS — R404 Transient alteration of awareness: Secondary | ICD-10-CM

## 2022-09-24 DIAGNOSIS — Z7982 Long term (current) use of aspirin: Secondary | ICD-10-CM | POA: Diagnosis not present

## 2022-09-24 DIAGNOSIS — I10 Essential (primary) hypertension: Secondary | ICD-10-CM | POA: Diagnosis present

## 2022-09-24 DIAGNOSIS — E162 Hypoglycemia, unspecified: Secondary | ICD-10-CM | POA: Diagnosis present

## 2022-09-24 DIAGNOSIS — I161 Hypertensive emergency: Secondary | ICD-10-CM

## 2022-09-24 DIAGNOSIS — F32A Depression, unspecified: Secondary | ICD-10-CM | POA: Diagnosis present

## 2022-09-24 DIAGNOSIS — E876 Hypokalemia: Secondary | ICD-10-CM | POA: Diagnosis present

## 2022-09-24 DIAGNOSIS — E039 Hypothyroidism, unspecified: Secondary | ICD-10-CM | POA: Diagnosis present

## 2022-09-24 DIAGNOSIS — R4182 Altered mental status, unspecified: Secondary | ICD-10-CM | POA: Diagnosis present

## 2022-09-24 LAB — BASIC METABOLIC PANEL
Anion gap: 11 (ref 5–15)
BUN: 12 mg/dL (ref 8–23)
CO2: 25 mmol/L (ref 22–32)
Calcium: 10.1 mg/dL (ref 8.9–10.3)
Chloride: 98 mmol/L (ref 98–111)
Creatinine, Ser: 0.9 mg/dL (ref 0.44–1.00)
GFR, Estimated: >60 mL/min (ref >60)
Glucose, Bld: 103 mg/dL — ABNORMAL HIGH (ref 70–99)
Potassium: 3.5 mmol/L (ref 3.5–5.1)
Sodium: 134 mmol/L — ABNORMAL LOW (ref 135–145)

## 2022-09-24 LAB — CBC
HCT: 37.2 % (ref 36.0–46.0)
Hemoglobin: 12.9 g/dL (ref 12.0–15.0)
MCH: 32.2 pg (ref 26.0–34.0)
MCHC: 34.7 g/dL (ref 30.0–36.0)
MCV: 92.8 fL (ref 80.0–100.0)
Platelets: 298 10*3/uL (ref 150–400)
RBC: 4.01 MIL/uL (ref 3.87–5.11)
RDW: 12.7 % (ref 11.5–15.5)
WBC: 6.7 10*3/uL (ref 4.0–10.5)
nRBC: 0 % (ref 0.0–0.2)

## 2022-09-24 LAB — MAGNESIUM: Magnesium: 1.6 mg/dL — ABNORMAL LOW (ref 1.7–2.4)

## 2022-09-24 LAB — URINE CULTURE: Culture: NO GROWTH

## 2022-09-24 LAB — CULTURE, BLOOD (ROUTINE X 2)

## 2022-09-24 LAB — GLUCOSE, CAPILLARY
Glucose-Capillary: 82 mg/dL (ref 70–99)
Glucose-Capillary: 91 mg/dL (ref 70–99)
Glucose-Capillary: 92 mg/dL (ref 70–99)
Glucose-Capillary: 92 mg/dL (ref 70–99)
Glucose-Capillary: 97 mg/dL (ref 70–99)
Glucose-Capillary: 97 mg/dL (ref 70–99)

## 2022-09-24 LAB — PHOSPHORUS: Phosphorus: 2.9 mg/dL (ref 2.5–4.6)

## 2022-09-24 MED ORDER — POLYETHYLENE GLYCOL 3350 17 G PO PACK
17.0000 g | PACK | Freq: Every day | ORAL | Status: DC
  Administered 2022-09-24: 17 g via ORAL
  Filled 2022-09-24: qty 1

## 2022-09-24 MED ORDER — MAGNESIUM SULFATE 4 GM/100ML IV SOLN
4.0000 g | Freq: Once | INTRAVENOUS | Status: AC
  Administered 2022-09-24: 4 g via INTRAVENOUS
  Filled 2022-09-24: qty 100

## 2022-09-24 MED ORDER — POTASSIUM CHLORIDE CRYS ER 20 MEQ PO TBCR
40.0000 meq | EXTENDED_RELEASE_TABLET | Freq: Once | ORAL | Status: AC
  Administered 2022-09-24: 40 meq via ORAL
  Filled 2022-09-24: qty 2

## 2022-09-24 MED ORDER — DOCUSATE SODIUM 100 MG PO CAPS
100.0000 mg | ORAL_CAPSULE | Freq: Two times a day (BID) | ORAL | Status: DC
  Administered 2022-09-24 (×2): 100 mg via ORAL
  Filled 2022-09-24 (×2): qty 1

## 2022-09-24 NOTE — Evaluation (Addendum)
Physical Therapy Evaluation Patient Details Name: Laura Griffin MRN: [id number] DOB: Aug 11, 1945 Today's Date: 09/24/2022  History of Present Illness  Laura Griffin is a 77 y.o. female who presents with concern for altered mental status. CT head showed chronic small vessel changes. MRI pending. Pt found to have hypertensive emergency and/or medication adverse effects. PMHx: cervical radiculopathy, diverticulosis, GERD, glaucoma, HTN, OA, orthostatic hypotension.  Clinical Impression  Pt admitted with above diagnosis. Pt able to ambulate with RW with min guard assist and min assist without RW.  Pt used cane PTA and recommend that she use device when she first goes home and has 24 hour care. Husband assists pt per son.  Pt is confused currently and was not PTA. Recommend therapy post acute < 3 hours day.  Will follow acutely.  Pt currently with functional limitations due to the deficits listed below (see PT Problem List). Pt will benefit from acute skilled PT to increase their independence and safety with mobility to allow discharge.          Recommendations for follow up therapy are one component of a multi-disciplinary discharge planning process, led by the attending physician.  Recommendations may be updated based on patient status, additional functional criteria and insurance authorization.  Follow Up Recommendations Can patient physically be transported by private vehicle: No     Assistance Recommended at Discharge Intermittent Supervision/Assistance  Patient can return home with the following  A little help with walking and/or transfers;A little help with bathing/dressing/bathroom;Assistance with cooking/housework;Assist for transportation;Help with stairs or ramp for entrance    Equipment Recommendations None recommended by PT  Recommendations for Other Services       Functional Status Assessment Patient has had a recent decline in their functional status and demonstrates the  ability to make significant improvements in function in a reasonable and predictable amount of time.     Precautions / Restrictions Precautions Precautions: Fall Restrictions Weight Bearing Restrictions: No      Mobility  Bed Mobility               General bed mobility comments: Pt in chair on arrival.    Transfers Overall transfer level: Needs assistance Equipment used: Rolling walker (2 wheels) Transfers: Sit to/from Stand, Bed to chair/wheelchair/BSC Sit to Stand: Mod assist           General transfer comment: requires max verbal/tactile cues for sequencing task and mod assist to stand from lower surfaces.  Min assist from normal surfaces to stand.  Son states that husband can assist pt    Ambulation/Gait Ambulation/Gait assistance: Min Chemical engineer (Feet): 300 Feet Assistive device: Rolling walker (2 wheels) Gait Pattern/deviations: Step-through pattern, Decreased stride length, Trunk flexed, Drifts right/left, Staggering left   Gait velocity interpretation: <1.31 ft/sec, indicative of household ambulator   General Gait Details: Pt was able to ambulate with RW with min guard assist and min assist without device. Without device, pt needed 1 UE support and lists to left.  States she used cane PTA.  REcommend pt use device and husband to assist her initially.  Stairs            Wheelchair Mobility    Modified Rankin (Stroke Patients Only)       Balance Overall balance assessment: Needs assistance Sitting-balance support: Single extremity supported, Feet supported, No upper extremity supported Sitting balance-Leahy Scale: Poor Sitting balance - Comments: sitting EOB   Standing balance support: Bilateral upper extremity supported, Reliant on assistive device for  balance Standing balance-Leahy Scale: Poor Standing balance comment: BUE support on RW for stability                             Pertinent Vitals/Pain Pain  Assessment Pain Assessment: No/denies pain Faces Pain Scale: No hurt    Home Living Family/patient expects to be discharged to:: Private residence Living Arrangements: Spouse/significant other Available Help at Discharge: Family;Available 24 hours/day Type of Home: House Home Access: Stairs to enter Entrance Stairs-Rails: Can reach both Entrance Stairs-Number of Steps: 3-4   Home Layout: One level Home Equipment: Gilmer Mor - single point Additional Comments: Home setup gathered from son. Husband has memory problems per son.    Prior Function Prior Level of Function : Independent/Modified Independent;Driving             Mobility Comments: Mod I with use of cane ADLs Comments: Independent with ADL/IADLs and driving     Hand Dominance   Dominant Hand: Right    Extremity/Trunk Assessment   Upper Extremity Assessment Upper Extremity Assessment: Defer to OT evaluation    Lower Extremity Assessment Lower Extremity Assessment: Overall WFL for tasks assessed    Cervical / Trunk Assessment Cervical / Trunk Assessment: Kyphotic  Communication   Communication: Other (comment) (difficult to assess 2/2 cognition)  Cognition Arousal/Alertness: Awake/alert Behavior During Therapy: Flat affect Overall Cognitive Status: Impaired/Different from baseline Area of Impairment: Following commands, Safety/judgement, Awareness, Problem solving, Memory, Orientation, Attention                 Orientation Level: Time, Situation Current Attention Level: Focused Memory: Decreased recall of precautions, Decreased short-term memory Following Commands: Follows one step commands inconsistently Safety/Judgement: Decreased awareness of deficits Awareness: Intellectual Problem Solving: Slow processing, Decreased initiation, Difficulty sequencing, Requires verbal cues, Requires tactile cues General Comments: A+O to person and place, inappropriate responses to questions, perseverating on date of  birth, follows 1-step commands inconsistently and requires max verbal/tactile cues. Unable to state/recall home setup, information gathered from son.        General Comments General comments (skin integrity, edema, etc.): 73 bpm, 96%RA, 17, 138/98    Exercises General Exercises - Lower Extremity Ankle Circles/Pumps: AROM, Both, 10 reps, Supine Long Arc Quad: AROM, Both, 10 reps, Seated   Assessment/Plan    PT Assessment Patient needs continued PT services  PT Problem List Decreased activity tolerance;Decreased balance;Decreased mobility;Decreased knowledge of use of DME;Decreased safety awareness;Decreased knowledge of precautions       PT Treatment Interventions DME instruction;Gait training;Functional mobility training;Therapeutic activities;Therapeutic exercise;Balance training;Patient/family education    PT Goals (Current goals can be found in the Care Plan section)  Acute Rehab PT Goals Patient Stated Goal: to go home PT Goal Formulation: With patient Time For Goal Achievement: 10/08/22 Potential to Achieve Goals: Good    Frequency Min 3X/week     Co-evaluation               AM-PAC PT "6 Clicks" Mobility  Outcome Measure Help needed turning from your back to your side while in a flat bed without using bedrails?: A Lot Help needed moving from lying on your back to sitting on the side of a flat bed without using bedrails?: A Lot Help needed moving to and from a bed to a chair (including a wheelchair)?: A Little Help needed standing up from a chair using your arms (e.g., wheelchair or bedside chair)?: A Lot Help needed to walk in hospital room?:  A Little Help needed climbing 3-5 steps with a railing? : A Lot 6 Click Score: 14    End of Session Equipment Utilized During Treatment: Gait belt Activity Tolerance: Patient limited by fatigue Patient left: in chair;with call bell/phone within reach;with chair alarm set Nurse Communication: Mobility status PT Visit  Diagnosis: Muscle weakness (generalized) (M62.81)    Time: 1610-9604 PT Time Calculation (min) (ACUTE ONLY): 34 min   Charges:   PT Evaluation $PT Eval Moderate Complexity: 1 Mod PT Treatments $Gait Training: 8-22 mins        [facility] M,PT Acute Rehab Services [phone]   Bevelyn Buckles 09/24/2022, 11:05 AM

## 2022-09-24 NOTE — Progress Notes (Signed)
NAME:  Laura Griffin, MRN:  [id number], DOB:  10-03-1945, LOS: 0 ADMISSION DATE:  09/23/2022, CONSULTATION DATE: 09/23/2022 REFERRING MD:  Lelon Mast, CHIEF COMPLAINT: Hypertensive emergency  History of Present Illness:  Patient is currently confused so history was obtained from her medical record, including the excellent admission note from [facility]. Per the notes family states that she did not sound completely normal yesterday and seems tired.  Family came to check on her and found that she was confused.  Brought to the ED and initially admitted to family practice.  Extensive workup was unremarkable: CT head showed chronic small vessel changes.  She had purulent urinary urine but no signs of infection.  Mild lactic acidosis.  Diagnosis was that this represented hypertensive emergency and/or medication adverse effects. Blood pressure rose to over 200 systolic and she was unable to take oral medications so critical care was consulted for ICU admission.  Pertinent  Medical History   Past Medical History:  Diagnosis Date   Allergic rhinitis 07/19/2014   Arthritis of knee, right 03/11/2006   Bursitis of shoulder, right 09/19/1999   Cervical radiculopathy at C6 05/24/2011   Beginning Nov 2012. Multi-level foraminal narrowing in C-spine plain films    Diverticulosis    DIVERTICULOSIS OF COLON 05/30/2006   Colonoscopy 06/2014 - multiple diverticula, no other abnormalities, no further colonoscopies recommended given patient's age of 25     GERD (gastroesophageal reflux disease) 07/19/2014   Diagnosed clinically by [facility] in January of 2016.     Glaucoma    Hearing decreased 08/07/2011   With whooshing tinnitus Mild bilateral loss on 08/2012 audiology who will recheck her hearing in one year     History of herniated intervertebral disc    Hypertension    HYPERTENSION, BENIGN SYSTEMIC 05/30/2006        Left sided sciatica 8/95,11/00   CT confirmed    LOW BACK PAIN SYNDROME 05/10/2009    Qualifier: Diagnosis of  By: Lorenda Hatchet CMA,, [place]  Mild C 3-4, 4-5 lumbar spinal stenosis MRI 08/2011, Diffuse lower thoracic and lumbar DDD    Mitral valve prolapse    per patient heart murmur   Orthostatic hypotension 06/12/2013   Osteoarthritis    Osteoarthritis of right knee 12/26/2012   Confirmed on xray 07/2014    OSTEOARTHRITIS, MULTI SITES 05/30/2006   She is stoic about pain and wants to avoid surgeries Past xrays have documented DJD in right knee and lumbar spine     Overweight (BMI 25.0-29.9) 05/30/2006   Pitting edema 07/19/2014   Bilateral 1+ pitting edema of calves first noted on 07/19/2014.     Sciatica 05/30/2006   Qualifier: Diagnosis of  By: Sheffield Slider MD, [place]  Involved left leg in 02/1999    Swallowing difficulty 07/06/2013   Urge incontinence 06/01/2008   Qualifier: Diagnosis of  By: Sheffield Slider MD, [facility] Events: Including procedures, antibiotic start and stop dates in addition to other pertinent events   6/23 initially admitted to family practice but transferred to the ICU when unable to take oral blood pressure medication. Cleviprex is off since 8 am this morning, BP 140's. PO antihypertensives started 6/23  Interim History / Subjective:  Blood pressure has improved spontaneously.  Patient is now speaking appropriately but disoriented. Having difficulty to follow commands, MRI cancelled.  Objective   Blood pressure (!) 151/99, pulse 64, temperature 97.9 F (36.6 C), temperature source Axillary, resp. rate 16, last menstrual period 04/02/1996, SpO2 99 %.  Intake/Output Summary (Last 24 hours) at 09/24/2022 0854 Last data filed at 09/24/2022 0655 Gross per 24 hour  Intake 1876.78 ml  Output 800 ml  Net 1076.78 ml   There were no vitals filed for this visit.  Examination: General: Overweight woman appearing stated age. Sitting in chair eating breakfast.  In no distress.Remains confused HENT: No scleral icterus.  Mucous membranes are moist. Lungs:  Bilateral chest excursion, Clear to auscultation , diminished per bases Cardiovascular: No edema, no JVD, heart sounds normal.  Extremities warm well-perfused, no obvious deformities. Abdomen: Soft and nontender. ND, BS + Extremities: Osteoarthritis.  No joint deformities. Neuro: Calm.  Awake.  Oriented to place, and self, Unsure of time, No insight.  Speech is fluent.  No cranial nerve deficits, no focal deficits.  Some inattention during examination GU: Deferred.  Labs reviewed 6/24 Na 134 Mag 1.6>> repleted ( 4 grams) K 3.5>> Received 40 mEq repletion  Phos 2.9 Cl 98 CO2 25 BUN 12 Creatinine 0.90 Calcium 10.1 Gap 11  Ancillary tests personally reviewed  6/23 Mild lactate elevation 2.2 Nominal troponin elevation 86 Normal CBC  Assessment & Plan:  Critically ill due to hypertensive encephalopathy requiring rapid control of blood pressure.  No apparent intercurrent illnesses no clear precipitants. Essential hypertension Hypomag Hypo K   Plan: -Symptoms have improved.  If becomes more confused again will consider EEG to rule out nonconvulsive status. - Off Cleviprex -Resumed home oral medications 09/23/2022 - Monitor and replete electrolytes as needed ( Mag and K on 6/24)  - Transfer to monitored bed and transition back to [facility] starting 6/25 am. Called resident and report given 6/24 9:25 am - Continue PT - Consider case management  referral before discharge for memory issues and potential for medication errors   Daily Goals Checklist  Pain/Anxiety/Delirium protocol (if indicated): None required VAP protocol (if indicated): Not applicable Respiratory support goals: Currently on room air Blood pressure target: Cleviprex for systolic blood pressure 150-170 DVT prophylaxis: Lovenox Nutrition Status: Oral intake GI prophylaxis: Not applicable Fluid status goals: Allow autoregulation Urinary catheter:  None required Central lines: PIV's only Glucose control:  Sliding scale Mobility/therapy needs: Progressive mobilization Antibiotic de-escalation: No antibiotics Restraints: Restraint type:  Not required Reason for restraints: Interference with medical treatment Home medication reconciliation: Resume home antihypertensive medications Daily labs: BMP Code Status: Full Family Communication: Family updated at bedside 6/24 Disposition: ICU  CRITICAL CARE Performed by: Bevelyn Ngo   Total critical care time: 19 minutes   Bevelyn Ngo, MSN, AGACNP-BC [place] [facility] See Amion for personal pager PCCM on call pager [phone]  09/24/2022 8:55 AM

## 2022-09-24 NOTE — Hospital Course (Signed)
Laura Griffin is a 77 y.o.female with a history of HTN, HLD, GERD, DDD, OA who was admitted to the [facility] Teaching [facility] at [facility] for change in mental status. Her hospital course is detailed below:  Mentation Change  uncontrolled hypertension Concern for hypertensive emergency Patient presented to [facility], ED after being found down by her son.  Typically, she is alert and oriented x 4.  However, on arrival to the ED, she was responsive to her name but did not answer questions appropriately.  CT head was negative for, MRI brain showed***, chest x-ray and UA unremarkable.  Patient did have significant hypertension to 190s/100s on admission.  They had her on Cleviprex drip after transition to ICU for close blood pressure monitoring.  She was subsequently weaned off of the Cleviprex and returned to her baseline.  She was restarted on home antihypertensives and transferred back to family medicine service.  She continued to improve and was sent home on***.    Other chronic conditions were medically managed with home medications and formulary alternatives as necessary (HLD, GERD, DDD, OA)  PCP Follow-up Recommendations:

## 2022-09-24 NOTE — Evaluation (Signed)
Occupational Therapy Evaluation Patient Details Name: Laura Griffin MRN: [id number] DOB: 1946/02/26 Today's Date: 09/24/2022   History of Present Illness Laura Griffin is a 77 y.o. female who presents with concern for altered mental status. CT head showed chronic small vessel changes. MRI pending. Pt found to have hypertensive emergency and/or medication adverse effects. PMHx: cervical radiculopathy, diverticulosis, GERD, glaucoma, HTN, OA, orthostatic hypotension.   Clinical Impression   Pt evaluated s/p above admission list. Per son, pt was independent with ADL/IADLs, driving and functional mobility with use of a cane. Pt presents this session with generalized weakness, decreased balance and impaired cognition. Pt A+O to self and place, requires significant tactile/verbal cues to follow commands, and responds to questions innapropriately with pt perseverating on date of birth. Pt currently requires min A for seated ADLs and max A +2 for LB ADLs. Pt completed STS transfer from EOB using RW with min A +2 and max cues for sequencing. Pt completed stand pivot transfer from EOB>recliner using RW with min A +2 and max cues for sequencing. Pt would benefit from continued acute OT services to maximize functional independence and facilitate transition to skilled inpatient follow up therapy, <3 hours/day.  Vitals: SpO2 >92% on RA throughout session. BP supine: 162/92, BP EOB: 113/87, BP EOB after 5 minutes: 138/88, BP post transfer to chair: 144/97, RN updated.      Recommendations for follow up therapy are one component of a multi-disciplinary discharge planning process, led by the attending physician.  Recommendations may be updated based on patient status, additional functional criteria and insurance authorization.   Assistance Recommended at Discharge Frequent or constant Supervision/Assistance  Patient can return home with the following Two people to help with walking and/or transfers;Two  people to help with bathing/dressing/bathroom;Assistance with cooking/housework;Direct supervision/assist for medications management;Direct supervision/assist for financial management;Assist for transportation;Help with stairs or ramp for entrance    Functional Status Assessment  Patient has had a recent decline in their functional status and demonstrates the ability to make significant improvements in function in a reasonable and predictable amount of time.  Equipment Recommendations  Other (comment) (defer)    Recommendations for Other Services       Precautions / Restrictions Precautions Precautions: Fall Restrictions Weight Bearing Restrictions: No      Mobility Bed Mobility Overal bed mobility: Needs Assistance Bed Mobility: Supine to Sit     Supine to sit: HOB elevated, Max assist     General bed mobility comments: BLE and trunk support, max cues for sequencing    Transfers Overall transfer level: Needs assistance Equipment used: Rolling walker (2 wheels) Transfers: Sit to/from Stand, Bed to chair/wheelchair/BSC Sit to Stand: Min assist, +2 physical assistance, +2 safety/equipment Stand pivot transfers: Min assist, +2 safety/equipment, +2 physical assistance         General transfer comment: requires max verbal/tactile cues for sequencing task      Balance Overall balance assessment: Needs assistance Sitting-balance support: Single extremity supported, Feet supported Sitting balance-Leahy Scale: Poor Sitting balance - Comments: sitting EOB   Standing balance support: Bilateral upper extremity supported, Reliant on assistive device for balance Standing balance-Leahy Scale: Poor Standing balance comment: BUE support on RW for stability                           ADL either performed or assessed with clinical judgement   ADL Overall ADL's : Needs assistance/impaired Eating/Feeding: Minimal assistance;Cueing for sequencing;Sitting   Grooming:  Minimal assistance;Sitting;Cueing for sequencing   Upper Body Bathing: Minimal assistance;Sitting;Cueing for sequencing   Lower Body Bathing: Maximal assistance;Sit to/from stand;Cueing for sequencing   Upper Body Dressing : Minimal assistance;Cueing for sequencing;Sitting   Lower Body Dressing: Maximal assistance;Sit to/from stand;Cueing for safety   Toilet Transfer: Minimal assistance;+2 for physical assistance;+2 for safety/equipment;BSC/3in1;Rolling walker (2 wheels);Cueing for sequencing;Stand-pivot Toilet Transfer Details (indicate cue type and reason): simulated Toileting- Clothing Manipulation and Hygiene: Maximal assistance;Sit to/from stand;Cueing for sequencing       Functional mobility during ADLs: Minimal assistance;+2 for safety/equipment;+2 for physical assistance;Cueing for sequencing;Rolling walker (2 wheels) General ADL Comments: limited secondary to cognition, generalized weakness, requires max verbal/tactile cues for sequencing     Vision Baseline Vision/History: 1 Wears glasses Ability to See in Adequate Light: 0 Adequate Vision Assessment?: No apparent visual deficits     Perception Perception Perception Tested?: No   Praxis Praxis Praxis tested?: Not tested    Pertinent Vitals/Pain Pain Assessment Pain Assessment: Faces Faces Pain Scale: No hurt Pain Intervention(s): Monitored during session     Hand Dominance Right   Extremity/Trunk Assessment Upper Extremity Assessment Upper Extremity Assessment: Defer to OT evaluation   Lower Extremity Assessment Lower Extremity Assessment: Overall WFL for tasks assessed   Cervical / Trunk Assessment Cervical / Trunk Assessment: Kyphotic   Communication Communication Communication: Other (comment) (difficult to assess 2/2 cognition)   Cognition Arousal/Alertness: Awake/alert Behavior During Therapy: Flat affect Overall Cognitive Status: Impaired/Different from baseline Area of Impairment: Following  commands, Safety/judgement, Awareness, Problem solving, Memory, Orientation, Attention                 Orientation Level: Time, Situation Current Attention Level: Focused Memory: Decreased recall of precautions, Decreased short-term memory Following Commands: Follows one step commands inconsistently Safety/Judgement: Decreased awareness of deficits Awareness: Intellectual Problem Solving: Slow processing, Decreased initiation, Difficulty sequencing, Requires verbal cues, Requires tactile cues General Comments: A+O to person and place, inappropriate responses to questions, perseverating on date of birth, follows 1-step commands inconsistently and requires max verbal/tactile cues. Unable to state/recall home setup, information gathered from son.     General Comments  SpO2 >92% on RA throughout session. BP supine: 162/92, BP EOB: 113/87, BP EOB after 5 minutes: 138/88, BP post transfer to chair: 144/97, RN updated. Son present throughout session.    Exercises     Shoulder Instructions      Home Living Family/patient expects to be discharged to:: Private residence Living Arrangements: Spouse/significant other Available Help at Discharge: Family;Available 24 hours/day Type of Home: House Home Access: Stairs to enter [employer] of Steps: 3-4 Entrance Stairs-Rails: Can reach both Home Layout: One level     Bathroom Shower/Tub: Chief Strategy Officer: Standard     Home Equipment: Gilmer Mor - single point   Additional Comments: Home setup gathered from son. Husband has memory problems per son.      Prior Functioning/Environment Prior Level of Function : Independent/Modified Independent;Driving             Mobility Comments: Mod I with use of cane ADLs Comments: Independent with ADL/IADLs and driving        OT Problem List: Decreased strength;Decreased activity tolerance;Impaired balance (sitting and/or standing);Decreased cognition;Decreased  knowledge of use of DME or AE;Decreased knowledge of precautions      OT Treatment/Interventions: Self-care/ADL training;Energy conservation;DME and/or AE instruction;Therapeutic activities;Cognitive remediation/compensation;Patient/family education;Balance training    OT Goals(Current goals can be found in the care plan section) Acute Rehab OT Goals Patient  Stated Goal: to eat breakfast OT Goal Formulation: With patient Time For Goal Achievement: 10/08/22 Potential to Achieve Goals: Good ADL Goals Pt Will Perform Lower Body Dressing: sit to/from stand;with min assist Pt Will Transfer to Toilet: with supervision;bedside commode;stand pivot transfer Additional ADL Goal #1: Pt will complete bed mobility with min A as a precursor to ADLs Additional ADL Goal #2: Pt will sequence 2-step ADL task with min assist  OT Frequency: Min 2X/week    Co-evaluation              AM-PAC OT "6 Clicks" Daily Activity     Outcome Measure Help from another person eating meals?: A Little Help from another person taking care of personal grooming?: A Little Help from another person toileting, which includes using toliet, bedpan, or urinal?: A Lot Help from another person bathing (including washing, rinsing, drying)?: A Lot Help from another person to put on and taking off regular upper body clothing?: A Little Help from another person to put on and taking off regular lower body clothing?: A Lot 6 Click Score: 15   End of Session Equipment Utilized During Treatment: Rolling walker (2 wheels);Gait belt Nurse Communication: Mobility status  Activity Tolerance: Patient tolerated treatment well Patient left: in chair;with chair alarm set;with family/visitor present;with call bell/phone within reach  OT Visit Diagnosis: Unsteadiness on feet (R26.81);Other abnormalities of gait and mobility (R26.89);Muscle weakness (generalized) (M62.81);History of falling (Z91.81);Other symptoms and signs involving  cognitive function                Time: 4098-1191 OT Time Calculation (min): 23 min Charges:  OT General Charges $OT Visit: 1 Visit OT Evaluation $OT Eval Moderate Complexity: 1 Mod OT Treatments $Therapeutic Activity: 8-22 mins  Sherley Bounds, OTS Acute Rehabilitation Services Office [phone] Secure Chat Communication Preferred   Sherley Bounds 09/24/2022, 10:20 AM

## 2022-09-24 NOTE — Progress Notes (Signed)
[facility] ADULT ICU REPLACEMENT PROTOCOL   The patient does apply for the [facility] Adult ICU Electrolyte Replacment Protocol based on the criteria listed below:   1.Exclusion criteria: TCTS, ECMO, Dialysis, and Myasthenia Gravis patients 2. Is GFR >/= 30 ml/min? Yes.    [name] GFR today is >60 3. Is SCr </= 2? Yes.   [name] SCr is 0.90 mg/dL 4. Did SCr increase >/= 0.5 in 24 hours? No. 5.Pt's weight >40kg  Yes.   6. Abnormal electrolyte(s): K+3.5, Mag 1.6  7. Electrolytes replaced per protocol 8.  Call MD STAT for K+ </= 2.5, Phos </= 1, or Mag </= 1 Physician:  Dr. Vladimir Faster  Lolita Lenz 09/24/2022 4:49 AM

## 2022-09-25 LAB — GLUCOSE, CAPILLARY
Glucose-Capillary: 77 mg/dL (ref 70–99)
Glucose-Capillary: 91 mg/dL (ref 70–99)
Glucose-Capillary: 100 mg/dL — ABNORMAL HIGH (ref 70–99)

## 2022-09-25 LAB — BASIC METABOLIC PANEL
Anion gap: 15 (ref 5–15)
BUN: 14 mg/dL (ref 8–23)
CO2: 23 mmol/L (ref 22–32)
Calcium: 10.5 mg/dL — ABNORMAL HIGH (ref 8.9–10.3)
Chloride: 99 mmol/L (ref 98–111)
Creatinine, Ser: 0.87 mg/dL (ref 0.44–1.00)
GFR, Estimated: >60 mL/min (ref >60)
Glucose, Bld: 90 mg/dL (ref 70–99)
Potassium: 3.5 mmol/L (ref 3.5–5.1)
Sodium: 137 mmol/L (ref 135–145)

## 2022-09-25 LAB — MAGNESIUM: Magnesium: 2.1 mg/dL (ref 1.7–2.4)

## 2022-09-25 MED ORDER — LOSARTAN POTASSIUM 100 MG PO TABS: 100.0000 mg | ORAL_TABLET | Freq: Every day | ORAL | 0 refills | Status: DC

## 2022-09-25 MED ORDER — LOSARTAN POTASSIUM 50 MG PO TABS
100.0000 mg | ORAL_TABLET | Freq: Every day | ORAL | Status: DC
  Administered 2022-09-25: 100 mg via ORAL
  Filled 2022-09-25: qty 2

## 2022-09-25 MED ORDER — GABAPENTIN 400 MG PO CAPS: 400.0000 mg | ORAL_CAPSULE | Freq: Three times a day (TID) | ORAL | 1 refills | Status: DC

## 2022-09-25 MED ORDER — GABAPENTIN 400 MG PO CAPS: 400.0000 mg | ORAL_CAPSULE | Freq: Three times a day (TID) | ORAL | 0 refills | Status: DC

## 2022-09-25 NOTE — Assessment & Plan Note (Signed)
Resolved after resolution of hypertensive emergency.

## 2022-09-25 NOTE — Progress Notes (Signed)
Physical Therapy Treatment Patient Details Name: Laura Griffin MRN: [id number] DOB: October 16, 1945 Today's Date: 09/25/2022   History of Present Illness Laura Griffin is a 77 y.o. female who presents with concern for altered mental status. CT head showed chronic small vessel changes. Pt found to have hypertensive emergency and/or medication adverse effects. PMHx: cervical radiculopathy, diverticulosis, GERD, glaucoma, HTN, OA, orthostatic hypotension.    PT Comments    Pt tolerates treatment well, transferring without physical assistance and ambulating without DME for periods. Pt reports chronic deficits in vision, withut glasses during session and tending to drift to right side. Pt does bump into computer chair once during session, however this could be related to her vision deficits and less related to impaired cognition. Pt reports assistance from multiple family members and friends at the time of discharge. PT updates recommendations to HHPT. PT encourages use of cane for all mobility at the time of discharge.   Recommendations for follow up therapy are one component of a multi-disciplinary discharge planning process, led by the attending physician.  Recommendations may be updated based on patient status, additional functional criteria and insurance authorization.  Follow Up Recommendations  Can patient physically be transported by private vehicle: Yes    Assistance Recommended at Discharge Intermittent Supervision/Assistance  Patient can return home with the following A little help with walking and/or transfers;A little help with bathing/dressing/bathroom;Assistance with cooking/housework;Assist for transportation;Help with stairs or ramp for entrance;Direct supervision/assist for medications management;Direct supervision/assist for financial management   Equipment Recommendations  None recommended by PT    Recommendations for Other Services       Precautions / Restrictions  Precautions Precautions: Fall Restrictions Weight Bearing Restrictions: No     Mobility  Bed Mobility Overal bed mobility: Needs Assistance Bed Mobility: Sit to Supine       Sit to supine: Min assist        Transfers Overall transfer level: Needs assistance Equipment used: Rolling walker (2 wheels) Transfers: Sit to/from Stand Sit to Stand: Min guard           General transfer comment: increased time    Ambulation/Gait Ambulation/Gait assistance: Min assist Gait Distance (Feet): 200 Feet Assistive device: Rolling walker (2 wheels), None Gait Pattern/deviations: Step-through pattern Gait velocity: reduced Gait velocity interpretation: <1.8 ft/sec, indicate of risk for recurrent falls   General Gait Details: slowed step-through gait with RW, pt ambulates final 150' without DME, tending to dirft to R side. Pt reports vision deficits and is ambulating without RW, bumps into chair on R side.   Stairs             Wheelchair Mobility    Modified Rankin (Stroke Patients Only)       Balance Overall balance assessment: Needs assistance Sitting-balance support: No upper extremity supported, Feet supported Sitting balance-Leahy Scale: Good     Standing balance support: No upper extremity supported, During functional activity Standing balance-Leahy Scale: Fair                              Cognition Arousal/Alertness: Awake/alert Behavior During Therapy: WFL for tasks assessed/performed Overall Cognitive Status: Impaired/Different from baseline Area of Impairment: Memory, Awareness, Problem solving                     Memory: Decreased short-term memory   Safety/Judgement: Decreased awareness of safety, Decreased awareness of deficits Awareness: Emergent Problem Solving: Slow processing, Difficulty sequencing  Exercises      General Comments General comments (skin integrity, edema, etc.): VSS on RA      Pertinent  Vitals/Pain Pain Assessment Pain Assessment: No/denies pain    Home Living                          Prior Function            PT Goals (current goals can now be found in the care plan section) Acute Rehab PT Goals Patient Stated Goal: to go home Progress towards PT goals: Progressing toward goals    Frequency    Min 3X/week      PT Plan Discharge plan needs to be updated    Co-evaluation              AM-PAC PT "6 Clicks" Mobility   Outcome Measure  Help needed turning from your back to your side while in a flat bed without using bedrails?: A Little Help needed moving from lying on your back to sitting on the side of a flat bed without using bedrails?: A Little Help needed moving to and from a bed to a chair (including a wheelchair)?: A Little Help needed standing up from a chair using your arms (e.g., wheelchair or bedside chair)?: A Little Help needed to walk in hospital room?: A Little Help needed climbing 3-5 steps with a railing? : A Lot 6 Click Score: 17    End of Session   Activity Tolerance: Patient tolerated treatment well Patient left: in bed;with call bell/phone within reach;with family/visitor present Nurse Communication: Mobility status PT Visit Diagnosis: Muscle weakness (generalized) (M62.81)     Time: 1344-1410 PT Time Calculation (min) (ACUTE ONLY): 26 min  Charges:  $Gait Training: 8-22 mins $Therapeutic Activity: 8-22 mins                     Laura Griffin, PT, DPT Acute Rehabilitation Office [phone]    Laura Griffin 09/25/2022, 3:06 PM

## 2022-09-25 NOTE — Discharge Summary (Signed)
Family Medicine Teaching [facility] Discharge Summary  Patient name: Laura Griffin Medical record number: [id number] Date of birth: [birth year] Age: 77 y.o. Gender: female Date of Admission: 09/23/2022  Date of Discharge: 09/25/22 Admitting Physician: Chi Mechele Collin, MD  Primary Care Provider: Latrelle Dodrill, MD Consultants: CCM  Indication for Hospitalization: AMS  Discharge Diagnoses/Problem List:  Principal Problem for Admission: Hypertensive encephalopathy Other Problems addressed during stay:  Principal Problem:   AMS (altered mental status) Active Problems:   Hypertensive emergency    Brief Hospital Course:  Laura Griffin is a 77 y.o.female with a history of HTN, HLD, GERD, DDD, OA who was admitted to the [facility] Teaching Service at [facility] for change in mental status. Her hospital course is detailed below:  AMS  Hypertensive encephalopathy Patient presented to [facility], ED after being found down by her son.  Typically, she is alert and oriented x 4.  However, on arrival to the ED, she was responsive to her name but did not answer questions appropriately.  CT head was negative for acute abnormality, chest x-ray and UA unremarkable.  Patient did have significant hypertension to 190s/100s on admission.  After initial admission to floor, blood pressure remained elevated and patient required admission to ICU.  Subsequently started and weaned off of Cleviprex gtt and patient's mental status resolved with blood pressure normalization.  She was restarted on home antihypertensives and transferred back to family medicine service.  Home losartan was increased to 100 mg daily.  Discharged in stable condition and requiring further blood pressure optimization.    Other chronic conditions were medically managed with home medications and formulary alternatives as necessary (HLD, GERD, DDD, OA)  PCP Follow-up Recommendations: BP Follow-up BMP given increase of losartan  dose.  Disposition: stable  Discharge Condition: home  Discharge Exam:  Vitals:   09/25/22 1028 09/25/22 1100  BP: (!) 153/89   Pulse: 72   Resp:    Temp:  (!) 97.2 F (36.2 C)  SpO2:     General: NAD  Neuro: A&Ox4 Cardiovascular: RRR, no murmurs, no peripheral edema Respiratory: normal WOB on RA, CTAB, no wheezes, ronchi or rales Abdomen: soft, NTTP, no rebound or guarding Extremities: Moving all 4 extremities equally  Significant Procedures: none  Significant Labs and Imaging:  Recent Labs  Lab 09/24/22 0104  WBC 6.7  HGB 12.9  HCT 37.2  PLT 298   Recent Labs  Lab 09/23/22 1414 09/24/22 0222 09/25/22 0739 09/25/22 0831  NA 136 134* 137  --   K 3.5 3.5 3.5  --   CL 98 98 99  --   CO2 24 25 23   --   GLUCOSE 113* 103* 90  --   BUN 13 12 14   --   CREATININE 0.88 0.90 0.87  --   CALCIUM 10.5* 10.1 10.5*  --   MG  --  1.6*  --  2.1  PHOS  --  2.9  --   --    CT Head 1. Generalized cerebral atrophy with chronic white matter small vessel ischemic changes. 2. No acute intracranial abnormality. 3. Marked severity left ethmoid sinus and sphenoid sinus disease.   Discharge Medications:  Allergies as of 09/25/2022       Reactions   Sulfamethoxazole-trimethoprim    REACTION: rash: nausea, swelling   Hctz [hydrochlorothiazide] Other (See Comments)   Elevated calcium        Medication List     TAKE these medications    acetaminophen  650 MG CR tablet Commonly known as: TYLENOL Take 1,300 mg by mouth 3 (three) times daily.   amLODipine 10 MG tablet Commonly known as: NORVASC TAKE 1 TABLET(10 MG) BY MOUTH AT BEDTIME What changed: See the new instructions.   aspirin EC 81 MG tablet Take 81 mg by mouth daily.   brimonidine-timolol 0.2-0.5 % ophthalmic solution Commonly known as: COMBIGAN Place 2 drops into both eyes every 12 (twelve) hours.   Cholecalciferol 25 MCG (1000 UT) capsule Take 2,000 Units by mouth daily.   FISH OIL PO Take 1  capsule by mouth daily.   gabapentin 400 MG capsule Commonly known as: NEURONTIN Take 1 capsule (400 mg total) by mouth 3 (three) times daily. What changed: See the new instructions.   latanoprost 0.005 % ophthalmic solution Commonly known as: XALATAN Place 1 drop into both eyes at bedtime.   losartan 100 MG tablet Commonly known as: COZAAR Take 1 tablet (100 mg total) by mouth daily. Start taking on: September 26, 2022 What changed:  medication strength how much to take how to take this when to take this additional instructions   metoprolol succinate 50 MG 24 hr tablet Commonly known as: TOPROL-XL TAKE 1 TABLET(50 MG) BY MOUTH AT BEDTIME WITH OR IMMEDIATELY FOLLOWING A MEAL What changed: See the new instructions.        Discharge Instructions: Please refer to Patient Instructions section of EMR for full details.  Patient was counseled important signs and symptoms that should prompt return to medical care, changes in medications, dietary instructions, activity restrictions, and follow up appointments.   Follow-Up Appointments:   Celine Mans, MD 09/25/2022, 1:08 PM PGY-1, [facility] Family Medicine

## 2022-09-25 NOTE — Progress Notes (Signed)
Occupational Therapy Treatment Patient Details Name: Laura Griffin MRN: [id number] DOB: November 10, 1945 Today's Date: 09/25/2022   History of present illness Laura Griffin is a 77 y.o. female who presents with concern for altered mental status. CT head showed chronic small vessel changes. Pt found to have hypertensive emergency and/or medication adverse effects. PMHx: cervical radiculopathy, diverticulosis, GERD, glaucoma, HTN, OA, orthostatic hypotension.   OT comments  Pt is making good progress towards their acute OT goals. Notable improvement in pt's cognition this date however she did have some difficulty sequencing bed mobility and need increased time and cues. Overall she needed mod A to get to the EOB, min A for STS with RW and min G for functional mobility with RW. Pt groomed in standing at the sink with min G and was able to sequence through functional tasks without cues. OT to continue to follow acutely to facilitate progress towards established goals. Pt will continue to benefit from skilled inpatient follow up therapy, <3 hours/day. - however, if pt's family is able to provide 24/7 supervision A then pt would be appropriate to d/c home with HHOT.    Recommendations for follow up therapy are one component of a multi-disciplinary discharge planning process, led by the attending physician.  Recommendations may be updated based on patient status, additional functional criteria and insurance authorization.    Assistance Recommended at Discharge Frequent or constant Supervision/Assistance  Patient can return home with the following  A little help with walking and/or transfers;A little help with bathing/dressing/bathroom;Assistance with cooking/housework;Direct supervision/assist for medications management;Direct supervision/assist for financial management;Help with stairs or ramp for entrance;Assist for transportation   Equipment Recommendations  BSC/3in1;Other (comment) (RW)        Precautions / Restrictions Precautions Precautions: Fall Restrictions Weight Bearing Restrictions: No       Mobility Bed Mobility Overal bed mobility: Needs Assistance Bed Mobility: Supine to Sit     Supine to sit: Mod assist     General bed mobility comments: mod A to bring hips to the EOB, significantly increased time and cues needed    Transfers Overall transfer level: Needs assistance Equipment used: Rolling walker (2 wheels) Transfers: Sit to/from Stand Sit to Stand: Min assist           General transfer comment: min A to power up, cue needed for hand placement     Balance Overall balance assessment: Needs assistance Sitting-balance support: Feet supported Sitting balance-Leahy Scale: Fair     Standing balance support: Single extremity supported, During functional activity Standing balance-Leahy Scale: Fair                             ADL either performed or assessed with clinical judgement   ADL Overall ADL's : Needs assistance/impaired Eating/Feeding: Independent;Sitting   Grooming: Min guard;Standing Grooming Details (indicate cue type and reason): at the sink         Upper Body Dressing : Set up;Sitting Upper Body Dressing Details (indicate cue type and reason): increased time     Toilet Transfer: Minimal assistance;Rolling walker (2 wheels) Toilet Transfer Details (indicate cue type and reason): min A to STS         Functional mobility during ADLs: Minimal assistance;Rolling walker (2 wheels);Cueing for safety General ADL Comments: increased time and cues needed    Extremity/Trunk Assessment Upper Extremity Assessment Upper Extremity Assessment: Generalized weakness   Lower Extremity Assessment Lower Extremity Assessment: Generalized weakness  Vision   Vision Assessment?: No apparent visual deficits   Perception Perception Perception: Not tested   Praxis Praxis Praxis: Intact    Cognition  Arousal/Alertness: Awake/alert Behavior During Therapy: Flat affect Overall Cognitive Status: Impaired/Different from baseline Area of Impairment: Attention, Memory, Following commands, Safety/judgement, Awareness, Problem solving                   Current Attention Level: Sustained Memory: Decreased short-term memory Following Commands: Follows one step commands consistently, Follows multi-step commands inconsistently Safety/Judgement: Decreased awareness of deficits, Decreased awareness of safety Awareness: Emergent Problem Solving: Decreased initiation, Slow processing, Difficulty sequencing, Requires verbal cues General Comments: A&Ox4. Improved cognition noted this session. Pt followed most simple 1 step commands, and functional 2 step commands. Cues needed to sequence bed mobility, but pt able to sequencing grooming without cues. Limited insigh tot safety and deficits as pt reports she "refuses to believe" she had high BP              General Comments VSS on RA, pt's friend visiting    Pertinent Vitals/ Pain       Pain Assessment Pain Assessment: Faces Faces Pain Scale: No hurt Pain Intervention(s): Monitored during session   Frequency  Min 2X/week        Progress Toward Goals  OT Goals(current goals can now be found in the care plan section)  Progress towards OT goals: Progressing toward goals  Acute Rehab OT Goals Patient Stated Goal: to go home OT Goal Formulation: With patient Time For Goal Achievement: 10/08/22 Potential to Achieve Goals: Good ADL Goals Pt Will Perform Lower Body Dressing: sit to/from stand;with min assist Pt Will Transfer to Toilet: with supervision;bedside commode;stand pivot transfer Additional ADL Goal #1: Pt will complete bed mobility with min A as a precursor to ADLs Additional ADL Goal #2: Pt will sequence 2-step ADL task with min assist  Plan Discharge plan remains appropriate       AM-PAC OT "6 Clicks" Daily Activity      Outcome Measure   Help from another person eating meals?: A Little Help from another person taking care of personal grooming?: A Little Help from another person toileting, which includes using toliet, bedpan, or urinal?: A Lot Help from another person bathing (including washing, rinsing, drying)?: A Lot Help from another person to put on and taking off regular upper body clothing?: A Little Help from another person to put on and taking off regular lower body clothing?: A Lot 6 Click Score: 15    End of Session Equipment Utilized During Treatment: Rolling walker (2 wheels)  OT Visit Diagnosis: Unsteadiness on feet (R26.81);Other abnormalities of gait and mobility (R26.89);Muscle weakness (generalized) (M62.81);History of falling (Z91.81);Other symptoms and signs involving cognitive function   Activity Tolerance Patient tolerated treatment well   Patient Left in chair;with call bell/phone within reach;with family/visitor present;with chair alarm set   Nurse Communication Mobility status        Time: 1135-1200 OT Time Calculation (min): 25 min  Charges: OT General Charges $OT Visit: 1 Visit OT Treatments $Self Care/Home Management : 23-37 mins  Derenda Mis, OTR/L Acute Rehabilitation Services Office [phone] Secure Chat Communication Preferred   Donia Pounds 09/25/2022, 12:47 PM

## 2022-09-25 NOTE — Assessment & Plan Note (Signed)
Bps moderately but improved since admission. Clevidipine drip stopped. Transfer back to FMTS today. -On Amlodipine, Losartan, Metoprolol

## 2022-09-25 NOTE — Progress Notes (Signed)
OT Cancellation Note  Patient Details Name: Laura Griffin MRN: [id number] DOB: 11-03-1945   Cancelled Treatment:    Reason Eval/Treat Not Completed: Other (comment) (OT imminent ordered received; discussed case with DO who would appreciate OT/PT treatment to determine if pt can d/c home vs. SNF. Will follow up as time allows.)  Donia Pounds 09/25/2022, 10:40 AM

## 2022-09-25 NOTE — TOC Transition Note (Signed)
Transition of Care [facility]) - CM/SW Discharge Note   Patient Details  Name: Laura Griffin MRN: [id number] Date of Birth: 1945/06/15  Transition of Care [facility]) CM/SW Contact:  Tom-Johnson, Hershal Coria, RN Phone Number: 09/25/2022, 4:01 PM   Clinical Narrative:     Patient is scheduled for discharge today.  Readmission Risk Assessment done. Home health info, Outpatient f/u, hospital f/u and discharge instructions on AVS. RW and BSC recommended, patient states she has DME's at home, friend Delight Hoh in room and verified.  Shalaine Payson to transport at discharge.  No further TOC needs noted.        Final next level of care: Home w Home Health Services Barriers to Discharge: Barriers Resolved   Patient Goals and CMS Choice CMS Medicare.gov Compare Post Acute Care list provided to:: Patient Choice offered to / list presented to : Patient  Discharge Placement                  Patient to be transferred to facility by: Friend Name of family member notified: Burnett Kanaris    Discharge Plan and Services Additional resources added to the After Visit Summary for                  DME Arranged: N/A (Declined) DME Agency: NA       HH Arranged: PT, RN, OT, Disease Management, Nurse's Aide, Social Work [employer] Agency: [facility] Date [facility] Agency Contacted: 09/25/22 Time HH Agency Contacted: 1550 Representative spoke with at [facility] [place] Agency: Tresa Endo  Social Determinants of Health (SDOH) Interventions SDOH Screenings   Food Insecurity: No Food Insecurity (07/30/2022)  Housing: Low Risk  (07/30/2022)  Transportation Needs: No Transportation Needs (06/14/2021)  Utilities: Not At Risk (07/30/2022)  Alcohol Screen: Low Risk  (07/30/2022)  Depression (PHQ2-9): Low Risk  (07/30/2022)  Financial Resource Strain: Low Risk  (07/30/2022)  Physical Activity: Inactive (07/30/2022)  Social Connections: Moderately Integrated (07/30/2022)  Stress: No Stress Concern Present (07/30/2022)   Tobacco Use: Medium Risk (09/23/2022)     Readmission Risk Interventions    09/25/2022    4:00 PM  Readmission Risk Prevention Plan  Post Dischage Appt Complete  Medication Screening Complete  Transportation Screening Complete

## 2022-09-25 NOTE — Discharge Instructions (Signed)
Dear Laura Griffin,  Thank you for letting us participate in your care. You were hospitalized for altered mental status and diagnosed with hypertensive emergency.  Your blood pressure was causing you do not think clearly.  You briefly had to go to the ICU to go on stronger blood pressure medications.  We have changed her blood pressure months as below.  POST-HOSPITAL & CARE INSTRUCTIONS Please make sure to go to your follow-up appointments below. Please make sure to take your blood pressure medication daily. If you become confused again have chest pain, shortness of breath please return to care.   DOCTOR'S APPOINTMENT   Future Appointments  Date Time Provider Department Center  09/28/2022 10:10 AM ACCESS TO CARE POOL [facility]  11/28/2022 10:30 AM London Sheer, MD [facility] None  02/13/2023  9:30 AM Patton Salles, MD [facility] None  04/18/2023 10:20 AM Carlus Pavlov, MD [facility] None     Take care and be well!  Family Medicine Teaching Service Inpatient Team [place]  [facility]  [address] [place], [place] [address] [phone]

## 2022-09-26 ENCOUNTER — Telehealth: Payer: Self-pay

## 2022-09-26 LAB — CULTURE, BLOOD (ROUTINE X 2): Special Requests: ADEQUATE

## 2022-09-26 NOTE — Transitions of Care (Post Inpatient/ED Visit) (Signed)
09/26/2022  Name: Laura Griffin MRN: [id number] DOB: [birth year]  Today's TOC FU Call Status: Today's TOC FU Call Status:: Successful TOC FU Call Competed TOC FU Call Complete Date: 09/26/22  Transition Care Management Follow-up Telephone Call Date of Discharge: 09/25/22 Discharge Facility: [facility]) Type of Discharge: Inpatient Admission Primary Inpatient Discharge Diagnosis:: Hypertensive Encephalopathy How have you been since you were released from the hospital?: Better Any questions or concerns?: No  Items Reviewed: Did you receive and understand the discharge instructions provided?: Yes Medications obtained,verified, and reconciled?: Yes (Medications Reviewed) Any new allergies since your discharge?: No Dietary orders reviewed?: Yes Type of Diet Ordered:: Low sodium, heart healthy Do you have support at home?: Yes People in Home: friend(s) Name of Support/Comfort Primary Source: Marcella  Medications Reviewed Today: Medications Reviewed Today     Reviewed by Jodelle Gross, RN (Case Manager) on 09/26/22 at 1406  Med List Status: <None>   Medication Order Taking? Sig Documenting Provider Last Dose Status Informant  acetaminophen (TYLENOL) 650 MG CR tablet [id number] Yes Take 1,300 mg by mouth 3 (three) times daily. [provider] Taking Active Family Member, Pharmacy Records  amLODipine (NORVASC) 10 MG tablet [id number] Yes TAKE 1 TABLET(10 MG) BY MOUTH AT BEDTIME  Patient taking differently: Take 10 mg by mouth at bedtime.   Latrelle Dodrill, MD Taking Active Family Member, Pharmacy Records  aspirin EC 81 MG tablet [id number] Yes Take 81 mg by mouth daily. [provider] Taking Active Family Member, Pharmacy Records  brimonidine-timolol (COMBIGAN) 0.2-0.5 % ophthalmic solution [phone] Yes Place 2 drops into both eyes every 12 (twelve) hours. [provider] Taking Active Family Member, Pharmacy Records  Cholecalciferol 25 MCG (1000  UT) capsule [id number] Yes Take 2,000 Units by mouth daily. [provider] Taking Active Family Member, Pharmacy Records  gabapentin (NEURONTIN) 400 MG capsule [id number] Yes Take 1 capsule (400 mg total) by mouth 3 (three) times daily. Shelby Mattocks, DO Taking Active   latanoprost (XALATAN) 0.005 % ophthalmic solution [id number] Yes Place 1 drop into both eyes at bedtime. [provider] Taking Active Family Member, Pharmacy Records  losartan (COZAAR) 100 MG tablet [id number] Yes Take 1 tablet (100 mg total) by mouth daily. Shelby Mattocks, DO Taking Active   metoprolol succinate (TOPROL-XL) 50 MG 24 hr tablet [id number] Yes TAKE 1 TABLET(50 MG) BY MOUTH AT BEDTIME WITH OR IMMEDIATELY FOLLOWING A MEAL  Patient taking differently: Take 50 mg by mouth See admin instructions. At bedtime or immediately following a meal.   Pollie Meyer Estevan Ryder, MD Taking Active Family Member, Pharmacy Records  Omega-3 Fatty Acids (FISH OIL PO) [id number] Yes Take 1 capsule by mouth daily. [provider] Taking Active Family Member, Pharmacy Records            Home Care and Equipment/Supplies: Were Home Health Services Ordered?: Yes Name of Home Health Agency:: [facility] EMR reviewed for Home Health Orders:  (This writer called [facility] and they will be calling patient to schedule soon) Any new equipment or medical supplies ordered?: No  Functional Questionnaire: Do you need assistance with bathing/showering or dressing?: No Do you need assistance with meal preparation?: No Do you need assistance with eating?: No Do you have difficulty maintaining continence: No Do you need assistance with getting out of bed/getting out of a chair/moving?: No Do you have difficulty managing or taking your medications?: No  Follow up appointments reviewed: PCP Follow-up appointment confirmed?: Yes Date of PCP follow-up appointment?: 09/28/22 Follow-up Provider:  Dr. Pollie Meyer Specialist [facility]  Follow-up appointment confirmed?: NA Do you need transportation to your follow-up appointment?: No Do you understand care options if your condition(s) worsen?: Yes-patient verbalized understanding  SDOH Interventions Today    Flowsheet Row Most Recent Value  SDOH Interventions   Food Insecurity Interventions Intervention Not Indicated  Housing Interventions Intervention Not Indicated  Transportation Interventions Intervention Not Indicated  Utilities Interventions Intervention Not Indicated  Financial Strain Interventions Intervention Not Indicated      Interventions Today    Flowsheet Row Most Recent Value  Chronic Disease   Chronic disease during today's visit Hypertension (HTN)       TOC Interventions Today    Flowsheet Row Most Recent Value  TOC Interventions   TOC Interventions Discussed/Reviewed TOC Interventions Discussed, TOC Interventions Reviewed, Contacted Home Health RN/OT/PT       Jodelle Gross, RN, BSN, CCM Care Management Coordinator [facility] Phone: [phone]/Fax: [phone]

## 2022-09-27 LAB — CULTURE, BLOOD (ROUTINE X 2)
Culture: NO GROWTH
Special Requests: ADEQUATE

## 2022-09-28 ENCOUNTER — Ambulatory Visit (INDEPENDENT_AMBULATORY_CARE_PROVIDER_SITE_OTHER): Payer: Medicare Other | Admitting: Student

## 2022-09-28 VITALS — BP 132/78 | HR 77 | Wt 144.4 lb

## 2022-09-28 DIAGNOSIS — M542 Cervicalgia: Secondary | ICD-10-CM | POA: Diagnosis not present

## 2022-09-28 DIAGNOSIS — I10 Essential (primary) hypertension: Secondary | ICD-10-CM

## 2022-09-28 LAB — CULTURE, BLOOD (ROUTINE X 2): Culture: NO GROWTH

## 2022-09-28 MED ORDER — GABAPENTIN 400 MG PO CAPS
400.0000 mg | ORAL_CAPSULE | Freq: Three times a day (TID) | ORAL | 0 refills | Status: DC
Start: 2022-09-28 — End: 2023-10-21

## 2022-09-28 NOTE — Patient Instructions (Addendum)
It was great to see you! Thank you for allowing me to participate in your care!  I recommend that you always bring your medications to each appointment as this makes it easy to ensure you are on the correct medications and helps [place] not miss when refills are needed.  Our plans for today:  - Please schedule an appointment with Dr. Pollie Meyer for a follow up visit - Bring your medications to your next appointment  - STOP taking Metoprolol -Continue all your other medications  Take care and seek immediate care sooner if you develop any concerns.   Dr. Erick Alley, DO [facility] Family [facility]

## 2022-09-28 NOTE — Progress Notes (Addendum)
    SUBJECTIVE:   CHIEF COMPLAINT / HPI:   HTN Hospitalized 6/23-6/25 for AMS in setting of hypertensive encephalopathy requiring admission to ICU for cleviprex gtt. She was discharged with increased dose of losartan 100 mg in addition to home amlodipine 10 mg and metoprolol 50 mg. She has been taking losartan but not the amlodipine or metoprolol because she was confused about what she was supposed to take. Has been feeling well since discharge. No complaints today.   Medications changes during hospitalization: Losartan increased to 100 mg Gabapentin decreased from 800 mg TID to 400 mg TID  PERTINENT  PMH / PSH: HTN, HLD  OBJECTIVE:   BP 132/78 (BP Location: Left Arm, Patient Position: Sitting, Cuff Size: Normal)   Pulse 77   Wt 144 lb 6.4 oz (65.5 kg)   LMP 04/02/1996   SpO2 100%   BMI 26.41 kg/m    General: NAD, pleasant, able to participate in exam Cardiac: RRR, no murmurs. Respiratory: CTAB, normal effort, No wheezes, rales or rhonchi Skin: warm and dry, no rashes noted Neuro: A&Ox4, no obvious focal deficits Psych: Normal affect and mood  ASSESSMENT/PLAN:   HYPERTENSION, BENIGN SYSTEMIC Well controlled today off metoprolol and amlodipine although w/ long half life, amlodipine could still be having effect as it was given prior to discharge. Discussed medications thoroughly.   -start back amlodipine 10 mg as prescribed  -Continue losartan 100 mg  -hold metoprolol as BP is well-controlled today -BMP today  -Return in ~ 1 month to f/u with PCP and bring ALL medications   Neck pain Pt did not complain of neck pain today. Gabapentin decreased from 800 mg TID to 400 mg TID. Pt states new prescription was not at pharmacy although d/c summary says it was ordered.  -Gabapentin 400 mg TID sent to pharmacy     Dr. Erick Alley, DO [facility]

## 2022-09-29 DIAGNOSIS — I951 Orthostatic hypotension: Secondary | ICD-10-CM | POA: Diagnosis not present

## 2022-09-29 DIAGNOSIS — H409 Unspecified glaucoma: Secondary | ICD-10-CM | POA: Diagnosis not present

## 2022-09-29 DIAGNOSIS — E663 Overweight: Secondary | ICD-10-CM | POA: Diagnosis not present

## 2022-09-29 DIAGNOSIS — M5432 Sciatica, left side: Secondary | ICD-10-CM | POA: Diagnosis not present

## 2022-09-29 DIAGNOSIS — N3946 Mixed incontinence: Secondary | ICD-10-CM | POA: Diagnosis not present

## 2022-09-29 DIAGNOSIS — M150 Primary generalized (osteo)arthritis: Secondary | ICD-10-CM | POA: Diagnosis not present

## 2022-09-29 DIAGNOSIS — M47816 Spondylosis without myelopathy or radiculopathy, lumbar region: Secondary | ICD-10-CM | POA: Diagnosis not present

## 2022-09-29 DIAGNOSIS — M5137 Other intervertebral disc degeneration, lumbosacral region: Secondary | ICD-10-CM | POA: Diagnosis not present

## 2022-09-29 DIAGNOSIS — E041 Nontoxic single thyroid nodule: Secondary | ICD-10-CM | POA: Diagnosis not present

## 2022-09-29 DIAGNOSIS — M4802 Spinal stenosis, cervical region: Secondary | ICD-10-CM | POA: Diagnosis not present

## 2022-09-29 DIAGNOSIS — K5730 Diverticulosis of large intestine without perforation or abscess without bleeding: Secondary | ICD-10-CM | POA: Diagnosis not present

## 2022-09-29 DIAGNOSIS — K219 Gastro-esophageal reflux disease without esophagitis: Secondary | ICD-10-CM | POA: Diagnosis not present

## 2022-09-29 DIAGNOSIS — N904 Leukoplakia of vulva: Secondary | ICD-10-CM | POA: Diagnosis not present

## 2022-09-29 DIAGNOSIS — E876 Hypokalemia: Secondary | ICD-10-CM | POA: Diagnosis not present

## 2022-09-29 DIAGNOSIS — M5136 Other intervertebral disc degeneration, lumbar region: Secondary | ICD-10-CM | POA: Diagnosis not present

## 2022-09-29 DIAGNOSIS — G8929 Other chronic pain: Secondary | ICD-10-CM | POA: Diagnosis not present

## 2022-09-29 DIAGNOSIS — E785 Hyperlipidemia, unspecified: Secondary | ICD-10-CM | POA: Diagnosis not present

## 2022-09-29 DIAGNOSIS — I674 Hypertensive encephalopathy: Secondary | ICD-10-CM | POA: Diagnosis not present

## 2022-09-29 DIAGNOSIS — I10 Essential (primary) hypertension: Secondary | ICD-10-CM | POA: Diagnosis not present

## 2022-09-29 DIAGNOSIS — J309 Allergic rhinitis, unspecified: Secondary | ICD-10-CM | POA: Diagnosis not present

## 2022-09-29 DIAGNOSIS — I161 Hypertensive emergency: Secondary | ICD-10-CM | POA: Diagnosis not present

## 2022-09-29 DIAGNOSIS — M48061 Spinal stenosis, lumbar region without neurogenic claudication: Secondary | ICD-10-CM | POA: Diagnosis not present

## 2022-09-29 DIAGNOSIS — H9313 Tinnitus, bilateral: Secondary | ICD-10-CM | POA: Diagnosis not present

## 2022-09-29 DIAGNOSIS — M5412 Radiculopathy, cervical region: Secondary | ICD-10-CM | POA: Diagnosis not present

## 2022-09-29 DIAGNOSIS — H903 Sensorineural hearing loss, bilateral: Secondary | ICD-10-CM | POA: Diagnosis not present

## 2022-09-29 LAB — BASIC METABOLIC PANEL
BUN/Creatinine Ratio: 9 — ABNORMAL LOW (ref 12–28)
BUN: 11 mg/dL (ref 8–27)
CO2: 25 mmol/L (ref 20–29)
Calcium: 10.5 mg/dL — ABNORMAL HIGH (ref 8.7–10.3)
Chloride: 101 mmol/L (ref 96–106)
Creatinine, Ser: 1.21 mg/dL — ABNORMAL HIGH (ref 0.57–1.00)
Glucose: 95 mg/dL (ref 70–99)
Potassium: 3.9 mmol/L (ref 3.5–5.2)
Sodium: 142 mmol/L (ref 134–144)
eGFR: 46 mL/min/{1.73_m2} — ABNORMAL LOW (ref >59)

## 2022-09-29 NOTE — Assessment & Plan Note (Signed)
Pt did not complain of neck pain today. Gabapentin decreased from 800 mg TID to 400 mg TID. Pt states new prescription was not at pharmacy although d/c summary says it was ordered.  -Gabapentin 400 mg TID sent to pharmacy

## 2022-09-29 NOTE — Assessment & Plan Note (Addendum)
Well controlled today off metoprolol and amlodipine although w/ long half life, amlodipine could still be having effect as it was given prior to discharge. Discussed medications thoroughly.   -start back amlodipine 10 mg as prescribed  -Continue losartan 100 mg  -hold metoprolol as BP is well-controlled today -Return in ~ 1 month to f/u with PCP and bring ALL medications

## 2022-10-01 ENCOUNTER — Telehealth: Payer: Self-pay

## 2022-10-01 DIAGNOSIS — I10 Essential (primary) hypertension: Secondary | ICD-10-CM | POA: Diagnosis not present

## 2022-10-01 DIAGNOSIS — M150 Primary generalized (osteo)arthritis: Secondary | ICD-10-CM | POA: Diagnosis not present

## 2022-10-01 DIAGNOSIS — E785 Hyperlipidemia, unspecified: Secondary | ICD-10-CM | POA: Diagnosis not present

## 2022-10-01 DIAGNOSIS — I674 Hypertensive encephalopathy: Secondary | ICD-10-CM | POA: Diagnosis not present

## 2022-10-01 DIAGNOSIS — K219 Gastro-esophageal reflux disease without esophagitis: Secondary | ICD-10-CM | POA: Diagnosis not present

## 2022-10-01 DIAGNOSIS — I161 Hypertensive emergency: Secondary | ICD-10-CM | POA: Diagnosis not present

## 2022-10-01 NOTE — Telephone Encounter (Signed)
Reuel Boom [facility] PT calls nurse line requesting verbal orders for [facility] PT as follows.   1x a week for 4 weeks  Every other week for 4 weeks  Verbal order given.   Reuel Boom reports she did decline HH OT.

## 2022-10-03 DIAGNOSIS — E785 Hyperlipidemia, unspecified: Secondary | ICD-10-CM | POA: Diagnosis not present

## 2022-10-03 DIAGNOSIS — M150 Primary generalized (osteo)arthritis: Secondary | ICD-10-CM | POA: Diagnosis not present

## 2022-10-03 DIAGNOSIS — I161 Hypertensive emergency: Secondary | ICD-10-CM | POA: Diagnosis not present

## 2022-10-03 DIAGNOSIS — I674 Hypertensive encephalopathy: Secondary | ICD-10-CM | POA: Diagnosis not present

## 2022-10-03 DIAGNOSIS — K219 Gastro-esophageal reflux disease without esophagitis: Secondary | ICD-10-CM | POA: Diagnosis not present

## 2022-10-03 DIAGNOSIS — I10 Essential (primary) hypertension: Secondary | ICD-10-CM | POA: Diagnosis not present

## 2022-10-09 ENCOUNTER — Encounter: Payer: Self-pay | Admitting: Family Medicine

## 2022-10-09 DIAGNOSIS — Z1231 Encounter for screening mammogram for malignant neoplasm of breast: Secondary | ICD-10-CM | POA: Diagnosis not present

## 2022-10-10 DIAGNOSIS — K219 Gastro-esophageal reflux disease without esophagitis: Secondary | ICD-10-CM | POA: Diagnosis not present

## 2022-10-10 DIAGNOSIS — E785 Hyperlipidemia, unspecified: Secondary | ICD-10-CM | POA: Diagnosis not present

## 2022-10-10 DIAGNOSIS — I674 Hypertensive encephalopathy: Secondary | ICD-10-CM | POA: Diagnosis not present

## 2022-10-10 DIAGNOSIS — I10 Essential (primary) hypertension: Secondary | ICD-10-CM | POA: Diagnosis not present

## 2022-10-10 DIAGNOSIS — M150 Primary generalized (osteo)arthritis: Secondary | ICD-10-CM | POA: Diagnosis not present

## 2022-10-10 DIAGNOSIS — I161 Hypertensive emergency: Secondary | ICD-10-CM | POA: Diagnosis not present

## 2022-10-12 DIAGNOSIS — I674 Hypertensive encephalopathy: Secondary | ICD-10-CM | POA: Diagnosis not present

## 2022-10-12 DIAGNOSIS — K219 Gastro-esophageal reflux disease without esophagitis: Secondary | ICD-10-CM | POA: Diagnosis not present

## 2022-10-12 DIAGNOSIS — I10 Essential (primary) hypertension: Secondary | ICD-10-CM | POA: Diagnosis not present

## 2022-10-12 DIAGNOSIS — I161 Hypertensive emergency: Secondary | ICD-10-CM | POA: Diagnosis not present

## 2022-10-12 DIAGNOSIS — M150 Primary generalized (osteo)arthritis: Secondary | ICD-10-CM | POA: Diagnosis not present

## 2022-10-12 DIAGNOSIS — E785 Hyperlipidemia, unspecified: Secondary | ICD-10-CM | POA: Diagnosis not present

## 2022-10-18 DIAGNOSIS — M150 Primary generalized (osteo)arthritis: Secondary | ICD-10-CM | POA: Diagnosis not present

## 2022-10-18 DIAGNOSIS — I10 Essential (primary) hypertension: Secondary | ICD-10-CM | POA: Diagnosis not present

## 2022-10-18 DIAGNOSIS — E785 Hyperlipidemia, unspecified: Secondary | ICD-10-CM | POA: Diagnosis not present

## 2022-10-18 DIAGNOSIS — I161 Hypertensive emergency: Secondary | ICD-10-CM | POA: Diagnosis not present

## 2022-10-18 DIAGNOSIS — I674 Hypertensive encephalopathy: Secondary | ICD-10-CM | POA: Diagnosis not present

## 2022-10-18 DIAGNOSIS — K219 Gastro-esophageal reflux disease without esophagitis: Secondary | ICD-10-CM | POA: Diagnosis not present

## 2022-10-22 DIAGNOSIS — I161 Hypertensive emergency: Secondary | ICD-10-CM | POA: Diagnosis not present

## 2022-10-22 DIAGNOSIS — M150 Primary generalized (osteo)arthritis: Secondary | ICD-10-CM | POA: Diagnosis not present

## 2022-10-22 DIAGNOSIS — I10 Essential (primary) hypertension: Secondary | ICD-10-CM | POA: Diagnosis not present

## 2022-10-22 DIAGNOSIS — E785 Hyperlipidemia, unspecified: Secondary | ICD-10-CM | POA: Diagnosis not present

## 2022-10-22 DIAGNOSIS — I674 Hypertensive encephalopathy: Secondary | ICD-10-CM | POA: Diagnosis not present

## 2022-10-22 DIAGNOSIS — K219 Gastro-esophageal reflux disease without esophagitis: Secondary | ICD-10-CM | POA: Diagnosis not present

## 2022-10-23 ENCOUNTER — Encounter: Payer: Self-pay | Admitting: Family Medicine

## 2022-10-23 ENCOUNTER — Ambulatory Visit (INDEPENDENT_AMBULATORY_CARE_PROVIDER_SITE_OTHER): Payer: Medicare Other | Admitting: Family Medicine

## 2022-10-23 VITALS — BP 151/90 | HR 70 | Ht 62.0 in | Wt 144.8 lb

## 2022-10-23 DIAGNOSIS — I10 Essential (primary) hypertension: Secondary | ICD-10-CM

## 2022-10-23 MED ORDER — LOSARTAN POTASSIUM 100 MG PO TABS: 100.0000 mg | ORAL_TABLET | Freq: Every day | ORAL | 2 refills | Status: DC

## 2022-10-23 NOTE — Patient Instructions (Signed)
It was great to see you again today.  Checking kidney function and calcium Refilled losartan Stay on current medications Follow up with me in 6 months, sooner if needed  Be well, Dr. Pollie Meyer

## 2022-10-23 NOTE — Progress Notes (Unsigned)
  Date of Visit: 10/23/2022   SUBJECTIVE:   HPI:  Laura Griffin presents today for routine follow-up.  Hypertension: Currently taking amlodipine 10 mg daily, losartan 100 mg daily.  Tolerating these medicines well.  Recently was admitted with hypertensive urgency of unclear etiology, as patient reports adherence with her medications.  Blood pressure at home this morning was 118/68.  Patient reports blood pressures generally run around this value at home.  Did not yet take blood pressure medications today.  Hypercalcemia: Previously followed up with endocrinology.  Not on calcium supplements.  Does take a vitamin D supplement.  Calcium level was 10.5 when checked in late June.  Agreeable to rechecking labs today  OBJECTIVE:   BP (!) 151/90 (BP Location: Left Arm, Patient Position: Sitting, Cuff Size: Normal)   Pulse 70   Ht 5\' 2"  (1.575 m)   Wt 144 lb 12.8 oz (65.7 kg)   LMP 04/02/1996   SpO2 100%   BMI 26.48 kg/m  Gen: No acute distress, pleasant, cooperative HEENT: Normocephalic, atraumatic Heart: Regular rate and rhythm, no murmur Lungs: Clear to auscultation bilaterally, normal effort Neuro: Grossly nonfocal, speech normal Ext: No edema bilateral lower extremities  ASSESSMENT/PLAN:   Health maintenance: -Recently had mammogram, will scan in report  HYPERTENSION, BENIGN SYSTEMIC Blood pressure mildly elevated today though reporting good home readings, suspect related to stress of coming to appointment in traffic on [address], along with not taking blood pressure medication yet today.  She will continue to check this at home and will let me know if it runs high at home as well.  Hypercalcemia Update BMET today to monitor calcium level and renal function.  Advise she may need to follow-up with endocrinology sooner than planned if calcium remains high.  FOLLOW UP: Follow up in 6 months for routine medical issues  [place] J. Pollie Meyer, MD [facility]

## 2022-10-24 DIAGNOSIS — I10 Essential (primary) hypertension: Secondary | ICD-10-CM | POA: Diagnosis not present

## 2022-10-24 DIAGNOSIS — E785 Hyperlipidemia, unspecified: Secondary | ICD-10-CM | POA: Diagnosis not present

## 2022-10-24 DIAGNOSIS — I674 Hypertensive encephalopathy: Secondary | ICD-10-CM | POA: Diagnosis not present

## 2022-10-24 DIAGNOSIS — K219 Gastro-esophageal reflux disease without esophagitis: Secondary | ICD-10-CM | POA: Diagnosis not present

## 2022-10-24 DIAGNOSIS — I161 Hypertensive emergency: Secondary | ICD-10-CM | POA: Diagnosis not present

## 2022-10-24 DIAGNOSIS — M150 Primary generalized (osteo)arthritis: Secondary | ICD-10-CM | POA: Diagnosis not present

## 2022-10-24 LAB — BASIC METABOLIC PANEL
BUN/Creatinine Ratio: 18 (ref 12–28)
BUN: 16 mg/dL (ref 8–27)
CO2: 23 mmol/L (ref 20–29)
Calcium: 11.1 mg/dL — ABNORMAL HIGH (ref 8.7–10.3)
Chloride: 103 mmol/L (ref 96–106)
Creatinine, Ser: 0.91 mg/dL (ref 0.57–1.00)
Glucose: 89 mg/dL (ref 70–99)
Potassium: 4.2 mmol/L (ref 3.5–5.2)
Sodium: 141 mmol/L (ref 134–144)
eGFR: 64 mL/min/{1.73_m2} (ref >59)

## 2022-10-25 NOTE — Assessment & Plan Note (Signed)
Blood pressure mildly elevated today though reporting good home readings, suspect related to stress of coming to appointment in traffic on [address], along with not taking blood pressure medication yet today.  She will continue to check this at home and will let me know if it runs high at home as well.

## 2022-10-25 NOTE — Assessment & Plan Note (Signed)
Update BMET today to monitor calcium level and renal function.  Advise she may need to follow-up with endocrinology sooner than planned if calcium remains high.

## 2022-10-29 DIAGNOSIS — H903 Sensorineural hearing loss, bilateral: Secondary | ICD-10-CM | POA: Diagnosis not present

## 2022-10-29 DIAGNOSIS — H409 Unspecified glaucoma: Secondary | ICD-10-CM | POA: Diagnosis not present

## 2022-10-29 DIAGNOSIS — I161 Hypertensive emergency: Secondary | ICD-10-CM | POA: Diagnosis not present

## 2022-10-29 DIAGNOSIS — G8929 Other chronic pain: Secondary | ICD-10-CM | POA: Diagnosis not present

## 2022-10-29 DIAGNOSIS — N3946 Mixed incontinence: Secondary | ICD-10-CM | POA: Diagnosis not present

## 2022-10-29 DIAGNOSIS — K5730 Diverticulosis of large intestine without perforation or abscess without bleeding: Secondary | ICD-10-CM | POA: Diagnosis not present

## 2022-10-29 DIAGNOSIS — M4802 Spinal stenosis, cervical region: Secondary | ICD-10-CM | POA: Diagnosis not present

## 2022-10-29 DIAGNOSIS — E041 Nontoxic single thyroid nodule: Secondary | ICD-10-CM | POA: Diagnosis not present

## 2022-10-29 DIAGNOSIS — M5136 Other intervertebral disc degeneration, lumbar region: Secondary | ICD-10-CM | POA: Diagnosis not present

## 2022-10-29 DIAGNOSIS — I951 Orthostatic hypotension: Secondary | ICD-10-CM | POA: Diagnosis not present

## 2022-10-29 DIAGNOSIS — K219 Gastro-esophageal reflux disease without esophagitis: Secondary | ICD-10-CM | POA: Diagnosis not present

## 2022-10-29 DIAGNOSIS — M5412 Radiculopathy, cervical region: Secondary | ICD-10-CM | POA: Diagnosis not present

## 2022-10-29 DIAGNOSIS — M47816 Spondylosis without myelopathy or radiculopathy, lumbar region: Secondary | ICD-10-CM | POA: Diagnosis not present

## 2022-10-29 DIAGNOSIS — M150 Primary generalized (osteo)arthritis: Secondary | ICD-10-CM | POA: Diagnosis not present

## 2022-10-29 DIAGNOSIS — E663 Overweight: Secondary | ICD-10-CM | POA: Diagnosis not present

## 2022-10-29 DIAGNOSIS — I10 Essential (primary) hypertension: Secondary | ICD-10-CM | POA: Diagnosis not present

## 2022-10-29 DIAGNOSIS — M48061 Spinal stenosis, lumbar region without neurogenic claudication: Secondary | ICD-10-CM | POA: Diagnosis not present

## 2022-10-29 DIAGNOSIS — E785 Hyperlipidemia, unspecified: Secondary | ICD-10-CM | POA: Diagnosis not present

## 2022-10-29 DIAGNOSIS — N904 Leukoplakia of vulva: Secondary | ICD-10-CM | POA: Diagnosis not present

## 2022-10-29 DIAGNOSIS — M5137 Other intervertebral disc degeneration, lumbosacral region: Secondary | ICD-10-CM | POA: Diagnosis not present

## 2022-10-29 DIAGNOSIS — I674 Hypertensive encephalopathy: Secondary | ICD-10-CM | POA: Diagnosis not present

## 2022-10-29 DIAGNOSIS — J309 Allergic rhinitis, unspecified: Secondary | ICD-10-CM | POA: Diagnosis not present

## 2022-10-29 DIAGNOSIS — H9313 Tinnitus, bilateral: Secondary | ICD-10-CM | POA: Diagnosis not present

## 2022-10-29 DIAGNOSIS — E876 Hypokalemia: Secondary | ICD-10-CM | POA: Diagnosis not present

## 2022-10-29 DIAGNOSIS — M5432 Sciatica, left side: Secondary | ICD-10-CM | POA: Diagnosis not present

## 2022-10-31 ENCOUNTER — Encounter: Payer: Self-pay | Admitting: Family Medicine

## 2022-10-31 ENCOUNTER — Telehealth: Payer: Self-pay | Admitting: Family Medicine

## 2022-10-31 NOTE — Telephone Encounter (Signed)
Attempted to reach patient to discuss labs, no answer, no option for leaving VM.  Calcium again elevated. She will need to get back in with endocrinology. Will send letter asking her to call us or endocrine office for appointment.  Latrelle Dodrill, MD

## 2022-11-04 DIAGNOSIS — I161 Hypertensive emergency: Secondary | ICD-10-CM | POA: Diagnosis not present

## 2022-11-04 DIAGNOSIS — I674 Hypertensive encephalopathy: Secondary | ICD-10-CM | POA: Diagnosis not present

## 2022-11-04 DIAGNOSIS — M150 Primary generalized (osteo)arthritis: Secondary | ICD-10-CM | POA: Diagnosis not present

## 2022-11-04 DIAGNOSIS — E785 Hyperlipidemia, unspecified: Secondary | ICD-10-CM | POA: Diagnosis not present

## 2022-11-04 DIAGNOSIS — I10 Essential (primary) hypertension: Secondary | ICD-10-CM | POA: Diagnosis not present

## 2022-11-04 DIAGNOSIS — K219 Gastro-esophageal reflux disease without esophagitis: Secondary | ICD-10-CM | POA: Diagnosis not present

## 2022-11-09 NOTE — Telephone Encounter (Signed)
Attempted again to reach patient x2. I do not see that she has moved up her endocrinology appointment yet. This time I was able to leave a voicemail (HIPAA compliant) asking her to call us back.  When she returns the call please let her know that her calcium is elevated, and she needs to call her endocrinologist's office for an appointment about this.  I am happy to speak with her, but will be out of the office next week. Dr. Deirdre Priest will be covering for me.  Thanks, Latrelle Dodrill, MD

## 2022-11-12 NOTE — Telephone Encounter (Signed)
Patient returns call to nurse line. Advised of message per Dr. Pollie Meyer.   Patient verbalizes understanding. She is requesting to schedule appointment for headaches.   Advised that Dr. Pollie Meyer is out of the office this week and next available is not for two weeks. She requests to schedule with next available provider.   Scheduled with Dr. Deirdre Priest for tomorrow morning.   Veronda Prude, RN

## 2022-11-13 ENCOUNTER — Other Ambulatory Visit: Payer: Self-pay

## 2022-11-13 ENCOUNTER — Ambulatory Visit (INDEPENDENT_AMBULATORY_CARE_PROVIDER_SITE_OTHER): Payer: Medicare Other | Admitting: Family Medicine

## 2022-11-13 ENCOUNTER — Encounter: Payer: Self-pay | Admitting: Family Medicine

## 2022-11-13 VITALS — BP 131/68 | HR 55 | Ht 65.0 in | Wt 174.6 lb

## 2022-11-13 DIAGNOSIS — I10 Essential (primary) hypertension: Secondary | ICD-10-CM | POA: Diagnosis not present

## 2022-11-13 DIAGNOSIS — M19012 Primary osteoarthritis, left shoulder: Secondary | ICD-10-CM

## 2022-11-13 DIAGNOSIS — R519 Headache, unspecified: Secondary | ICD-10-CM | POA: Diagnosis not present

## 2022-11-13 MED ORDER — DICLOFENAC SODIUM 3 % EX GEL
CUTANEOUS | 3 refills | Status: DC
Start: 2022-11-13 — End: 2022-11-23

## 2022-11-13 NOTE — Assessment & Plan Note (Signed)
At goal today.  Continue her current medications

## 2022-11-13 NOTE — Progress Notes (Signed)
    SUBJECTIVE:   CHIEF COMPLAINT / HPI:   Headache Points to base of R neck as tender area that she describes as headache.  No pain in front of head.  Has known degenarative changes of cervical spine Takes tylenol which helps a little.  No visual changes or nausea and vomiting or weakness or known trauma or incontinence   Hypercalcemia Last calcium 11.1 in July 2024.  Has not tried to contact endocrine but has standing appointment in January 2025.  No muscle cramps or GI symptoms.  Not taking any calcium supplements   Hypertension Knows her medications and is taking regularly.  Can't remember exact blood pressure readings at home but does have cuff.     PERTINENT  PMH / PSH: Hypertension, Hypercalcemia  OBJECTIVE:   BP 131/68   Pulse (!) 55   Ht 5\' 5"  (1.651 m)   Wt 174 lb 9.6 oz (79.2 kg)   LMP 04/02/1996   SpO2 100%   BMI 29.05 kg/m   Alert knows dates and appts and names of medications Neck - good ROM.  Tender mildly at base of R skull without palpable deformity.  No temporal artery tenderness PERRL  Fundi not able to see [facility] well using her long term purple cane   ASSESSMENT/PLAN:   Osteoarthritis of left shoulder, unspecified osteoarthritis type -     Diclofenac Sodium; Apply four times a day on the painful area at the base of your neck  Dispense: 100 g; Refill: 3  Nonintractable headache, unspecified chronicity pattern, unspecified headache type Assessment & Plan: What she calls a headache seems to be focal occipital R tenderness likely due to cervical degeneration (see recent MRI)  She has orthopedic follow up scheduled.  No signs of intracranial causes or TA and doubt related to hypercalcemia.   Treat with topical NSAID heat and range of motion  .     HYPERTENSION, BENIGN SYSTEMIC Assessment & Plan: At goal today.  Continue her current medications       Patient Instructions  Good to see you today - Thank you for coming in  Things we discussed  today:  Headache neck pain Apply the voltaren gel four times a day  Use warm compress in between Do range of motion exercise four times a day Follow up with Dr Christell Constant for the neck arthritis  Elevated Calcium Follow up with your endocrinologist - Call them  for an earlier appointment Come back for a remeasure in 3 months   Your goal blood pressure is less than 140/90.  Check your blood pressure several times a week.  If regularly higher than this please let me know - either with MyChart or leaving a phone message. Next visit please bring in your blood pressure cuff.     Please always bring your medication bottles     Carney Living, MD [facility]

## 2022-11-13 NOTE — Patient Instructions (Signed)
Good to see you today - Thank you for coming in  Things we discussed today:  Headache neck pain Apply the voltaren gel four times a day  Use warm compress in between Do range of motion exercise four times a day Follow up with Dr Christell Constant for the neck arthritis  Elevated Calcium Follow up with your endocrinologist - Call them  for an earlier appointment Come back for a remeasure in 3 months   Your goal blood pressure is less than 140/90.  Check your blood pressure several times a week.  If regularly higher than this please let me know - either with MyChart or leaving a phone message. Next visit please bring in your blood pressure cuff.     Please always bring your medication bottles

## 2022-11-13 NOTE — Assessment & Plan Note (Signed)
What she calls a headache seems to be focal occipital R tenderness likely due to cervical degeneration (see recent MRI)  She has orthopedic follow up scheduled.  No signs of intracranial causes or TA and doubt related to hypercalcemia.   Treat with topical NSAID heat and range of motion  .

## 2022-11-21 DIAGNOSIS — I674 Hypertensive encephalopathy: Secondary | ICD-10-CM | POA: Diagnosis not present

## 2022-11-21 DIAGNOSIS — I161 Hypertensive emergency: Secondary | ICD-10-CM | POA: Diagnosis not present

## 2022-11-21 DIAGNOSIS — E785 Hyperlipidemia, unspecified: Secondary | ICD-10-CM | POA: Diagnosis not present

## 2022-11-21 DIAGNOSIS — M150 Primary generalized (osteo)arthritis: Secondary | ICD-10-CM | POA: Diagnosis not present

## 2022-11-21 DIAGNOSIS — K219 Gastro-esophageal reflux disease without esophagitis: Secondary | ICD-10-CM | POA: Diagnosis not present

## 2022-11-21 DIAGNOSIS — I10 Essential (primary) hypertension: Secondary | ICD-10-CM | POA: Diagnosis not present

## 2022-11-23 ENCOUNTER — Other Ambulatory Visit: Payer: Self-pay

## 2022-11-23 DIAGNOSIS — M19012 Primary osteoarthritis, left shoulder: Secondary | ICD-10-CM

## 2022-11-27 MED ORDER — DICLOFENAC SODIUM 3 % EX GEL
CUTANEOUS | 3 refills | Status: AC
Start: 2022-11-27 — End: ?

## 2022-11-28 ENCOUNTER — Ambulatory Visit (INDEPENDENT_AMBULATORY_CARE_PROVIDER_SITE_OTHER): Payer: Medicare Other | Admitting: Orthopedic Surgery

## 2022-11-28 DIAGNOSIS — G549 Nerve root and plexus disorder, unspecified: Secondary | ICD-10-CM | POA: Diagnosis not present

## 2022-11-28 NOTE — Progress Notes (Signed)
Orthopedic Spine Surgery Office Note   Assessment: Patient is a 77 y.o. female with signs and symptoms consistent with progressive cervical spondylotic myelopathy.  Has central stenosis at C3/4 and C4/5.  She also has foraminal stenosis at multiple levels that would explain her radicular type pain.     Plan: -We discussed surgery again given that she has symptoms, exam findings, and imaging consistent with myelopathy.  She still has significant concerns about her age and surgery.  She also feels that her symptoms have been stable so she wants to continue to monitor for now -For her pain, she wanted to try Voltaren gel which I told her would be okay to try -For pain control, I told her we could also do an injection with Dr. Alvester Morin but she is not interested in that treatment at this time.  She said she would call the office if she wants to try that treatment -Patient should return to office in 12 weeks, x-rays at next visit: None     Patient expressed understanding of the plan and all questions were answered to the patient's satisfaction.    ___________________________________________________________________________     History:   Patient is a 77 y.o. female who has previously been seen in the office for symptoms of myeloradiculopathy.  Patient is still having neck pain that radiates into the bilateral upper extremities.  She feels that going into the trapezius muscles and the lateral aspects of the shoulders.  It goes to about the level of the mid humerus and both arms.  It does not radiate past that point.  She states that the neck pain right now is worse than the arm pain.  She says that her hand clumsiness and unsteadiness with her gait is the same as when I last saw her.  She rates the pain as a 7 out of 10 when its at its worst.   Treatments tried: Activity modification, cane use, Tylenol     Physical Exam:   General: no acute distress, appears stated age Neurologic: alert, answering  questions appropriately, following commands Respiratory: unlabored breathing on room air, symmetric chest rise Psychiatric: appropriate affect, normal cadence to speech     MSK (spine):   -Strength exam                                                   Left                  Right Grip strength                5/5                  5/5 Interosseus                  5/5                  5/5 Wrist extension            5/5                  5/5 Wrist flexion                 5/5                  5/5 Elbow flexion  5/5                  5/5 Deltoid                          5/5                  5/5   EHL                              4/5                  4/5 TA                                 5/5                  5/5 GSC                             5/5                  5/5 Knee extension            5/5                  5/5 Hip flexion                    4/5                  4/5   -Sensory exam                           Sensation intact to light touch in L3-S1 nerve distributions of bilateral lower extremities             Sensation intact to light touch in C5-T1 nerve distributions of bilateral upper extremities   -Brachioradialis DTR: 2/4 on the left, 2/4 on the right -Biceps DTR: 2/4 on the left, 2/4 on the right -Achilles DTR: 2/4 on the left, 2/4 on the right -Patellar tendon DTR: 2/4 on the left, 2/4 on the right   -Spurling: Negative bilaterally -Hoffman sign: Negative bilaterally -Clonus: No beats bilaterally -Interosseous wasting: None seen -Grip and release test: Positive -Romberg: Positive -Gait: Wide-based and uses cane -Imbalance with tandem gait: Yes   Imaging: XR of the cervical spine from 04/09/2022 and 05/30/2022 was previously independently reviewed and interpreted, showing multilevel spondylosis with disc height loss and anterior osteophyte formation.  Spondylolisthesis at C3/4 that shifts about 1 mm between flexion/extension views.  No fracture or  dislocation seen.   MRI of the cervical spine from 05/02/2022 was previously independently reviewed and interpreted, showing central stenosis at C3 3/4 and C4/5.  Foraminal stenosis on the right at C2/3, bilateral foraminal stenosis at C3/4, bilateral foraminal stenosis at C4/5, bilateral foraminal stenosis at C5/6, and bilateral foraminal stenosis at C6/7.  No T2 cord signal change seen.     Patient name: Laura Griffin Patient MRN: [id number] Date of visit: 11/28/22

## 2022-12-13 ENCOUNTER — Ambulatory Visit (INDEPENDENT_AMBULATORY_CARE_PROVIDER_SITE_OTHER): Payer: Medicare Other | Admitting: Internal Medicine

## 2022-12-13 ENCOUNTER — Encounter: Payer: Self-pay | Admitting: Internal Medicine

## 2022-12-13 DIAGNOSIS — E041 Nontoxic single thyroid nodule: Secondary | ICD-10-CM

## 2022-12-13 LAB — VITAMIN D 25 HYDROXY (VIT D DEFICIENCY, FRACTURES): VITD: 41.25 ng/mL (ref 30.00–100.00)

## 2022-12-13 NOTE — Progress Notes (Addendum)
Patient ID: Laura Griffin, female   DOB: [birth year], 77 y.o.   MRN: [id number]   HPI  Laura Griffin is a 77 y.o.-year-old female, initially referred by her PCP, Dr. Pollie Meyer, returning for follow-up for hypercalcemia/hyperparathyroidism and thyroid nodule.  Last visit 8 ago.  Reviewed history: No falls or fractures.  She still has some problems with balance - uses a cane. Since last visit, she was found down by her husband. She thinks she has a "ministroke" (she lost conscious for 1 day, reportedly). She was taken to the emergency room and admitted with altered mental status and hypertensive emergency (systolic blood pressure 190) on 09/23/2022.  She was admitted to ICU.  At discharge, losartan was increased to 100 mg daily and Amlodipine continued. She stopped Metoprolol. She will have a root canal soon.  She is nervous about this.  Elevated calcium  - seen ~2005 when she was on calcium supplements.  She was advised to stop the supplements and calcium normalized  - however, in 2019, her calcium started to return elevated again.   - in 05/2019, we stopped multivitamins with calcium.  Subsequent PTH and calcium levels were normal. - in 01/2021, calcium was high again and it continued to fluctuate around the upper limit of the target range afterwards  I reviewed her pertinent labs: Component     Latest Ref Rng 04/17/2022  Calcium Ionized     4.7 - 5.5 mg/dL 5.5    Component     Latest Ref Rng & Units 04/11/2021  Calcium Ionized     4.8 - 5.6 mg/dL 4.09   Component     Latest Ref Rng & Units 03/22/2020  Calcium Ionized     4.8 - 5.6 mg/dL 5.5   Lab Results  Component Value Date   PTH 40 09/23/2019   PTH 29 06/10/2019   PTH 41 01/13/2019   PTH Comment 01/13/2019   PTH 44.3 04/25/2011   PTH 23.8 08/28/2007   CALCIUM 11.1 (H) 10/23/2022   CALCIUM 10.5 (H) 09/28/2022   CALCIUM 10.5 (H) 09/25/2022   CALCIUM 10.1 09/24/2022   CALCIUM 10.5 (H) 09/23/2022   CALCIUM 10.7 (H)  09/23/2022   CALCIUM 11.2 (H) 09/23/2022   CALCIUM 10.4 (H) 11/06/2021   CALCIUM 10.4 (H) 02/01/2021   CALCIUM 9.9 12/12/2020  12/15/2018: Corrected calcium 10.48 (ULN 10.3).  No history of osteoporosis.   DXA scan report from 2011: T-scores were normal.   DXA scan report from 05/03/2021: T-scores were normal  No history of kidney stones.  24-hour urine calcium (02/08/2019) was normal, with a calcium of 91 mg/24 h, however, urine creatinine level was not checked at that time so it was unclear whether this was an appropriate collection or not.  We checked this again but unfortunately a BMP was not drawn so we could not calculate the fractional excretion of calcium: Component     Latest Ref Rng & Units 06/12/2019  Creatinine, 24H Ur     0.50 - 2.15 g/24 h 0.77  Calcium, 24H Urine     mg/24 h 66   After stopping her calcium supplement (multivitamin), her calcium, PTH, calcitriol levels were normal, as were her phosphorus and magnesium Component     Latest Ref Rng & Units 06/10/2019  Vitamin D 1, 25 (OH) Total     18 - 72 pg/mL 30  Vitamin D3 1, 25 (OH)     pg/mL 30  Vitamin D2 1, 25 (OH)     pg/mL <  8  PTH, Intact     15 - 65 pg/mL 29  Phosphorus     2.3 - 4.6 mg/dL 3.1  Magnesium     1.5 - 2.5 mg/dL 1.8   Component     Latest Ref Rng & Units 06/15/2019  Calcium     8.4 - 10.5 mg/dL 16.1   + Mild CKD.  Latest BUN/Cr: Lab Results  Component Value Date   BUN 16 10/23/2022   BUN 11 09/28/2022   CREATININE 0.91 10/23/2022   CREATININE 1.21 (H) 09/28/2022   Previously on HCTZ, stopped in 08-12/2018.  No history of vitamin D deficiency: Lab Results  Component Value Date   VD25OH 35.33 04/17/2022   VD25OH 37.56 04/11/2021   VD25OH 37.4 01/28/2020   VD25OH 30.8 09/23/2019   VD25OH 31.79 05/26/2019   She was previously on calcium (multivitamins - 300 mg) + 1000 units vitamin D daily.  We stopped her calcium and I advised her to increase her vitamin D daily dose to 2000  units daily.  She is now on this dose.  She denies a FH of hypercalcemia, pituitary tumors, thyroid cancer, but sister fell and fractured wrist - likely osteoporosis.   Pt. also has a history of HTN, OA, GERD (not on PPIs), sciatica.   Her mother died of cerebral hemorrhage in her late 59s as a complication of uncontrolled hypertension.  Thyroid nodule: -Subcentimeter -Without worrisome features  Reviewed her thyroid ultrasound report from 2017: Right thyroid lobe : 2.6 cm x 0.8 cm x 1.7 cm. Heterogeneous appearance of the right thyroid tissue without significantly increased flow.   Left thyroid lobe : 3.6 cm x 1.2 cm x 1.3 cm. Heterogeneous appearance of left-sided thyroid tissue without significantly increased flow. Nodule at the inferior left thyroid measures 8 mm x 6 mm x 7 mm with no internal calcifications.  Isthmus Thickness: 5 mm.  No nodules visualized.  Lymphadenopathy: None visualized.   IMPRESSION: Heterogeneous thyroid with single left-sided nodule.   Findings do not meet current SRU consensus criteria for biopsy. Follow-up by clinical exam is recommended. In a patient of this age with no risk factors for thyroid carcinoma, lengthening the surveillance interval beyond 12 months is a reasonable strategy, as thyroid cancers <2cm are known to be indolent with nearly 100% 10-year survival. If the patient has known risk factors for thyroid carcinoma, a follow-up ultrasound in 12 months is recommended.  Pt denies: - feeling nodules in neck - hoarseness - dysphagia (only occasionally, with pills - fish oil) - choking  No family history of thyroid cancer.  No history of radiation to head or neck.  Latest TSH: Lab Results  Component Value Date   TSH 1.46 03/22/2020   TSH 1.87 05/26/2019   TSH 1.638 05/05/2015   TSH 0.807 07/06/2013   TSH 1.196 10/03/2010   TSH 1.116 11/11/2007   She has chronic sciatica, improved. She also has OA in R knee - sees Dr. Dorene Grebe at [place].  She has gel injections, but no steroid injections.  ROS: + see HPI  I reviewed pt's medications, allergies, PMH, social hx, family hx, and changes were documented in the history of present illness. Otherwise, unchanged from my initial visit note.  Past Medical History:  Diagnosis Date   Allergic rhinitis 07/19/2014   Arthritis of knee, right 03/11/2006   Bursitis of shoulder, right 09/19/1999   Cervical radiculopathy at C6 05/24/2011   Beginning Nov 2012. Multi-level foraminal narrowing in C-spine plain films  Diverticulosis    DIVERTICULOSIS OF COLON 05/30/2006   Colonoscopy 06/2014 - multiple diverticula, no other abnormalities, no further colonoscopies recommended given patient's age of 67     GERD (gastroesophageal reflux disease) 07/19/2014   Diagnosed clinically by [facility] in January of 2016.     Glaucoma    Hearing decreased 08/07/2011   With whooshing tinnitus Mild bilateral loss on 08/2012 audiology who will recheck her hearing in one year     History of herniated intervertebral disc    Hypertension    HYPERTENSION, BENIGN SYSTEMIC 05/30/2006        Left sided sciatica 8/95,11/00   CT confirmed    LOW BACK PAIN SYNDROME 05/10/2009   Qualifier: Diagnosis of  By: Lorenda Hatchet CMA,, [place]  Mild C 3-4, 4-5 lumbar spinal stenosis MRI 08/2011, Diffuse lower thoracic and lumbar DDD    Mitral valve prolapse    per patient heart murmur   Orthostatic hypotension 06/12/2013   Osteoarthritis    Osteoarthritis of right knee 12/26/2012   Confirmed on xray 07/2014    OSTEOARTHRITIS, MULTI SITES 05/30/2006   She is stoic about pain and wants to avoid surgeries Past xrays have documented DJD in right knee and lumbar spine     Overweight (BMI 25.0-29.9) 05/30/2006   Pitting edema 07/19/2014   Bilateral 1+ pitting edema of calves first noted on 07/19/2014.     Sciatica 05/30/2006   Qualifier: Diagnosis of  By: Sheffield Slider MD, Wayne  Involved left leg in 02/1999    Swallowing  difficulty 07/06/2013   Urge incontinence 06/01/2008   Qualifier: Diagnosis of  By: Sheffield Slider MD, Deniece Portela     Past Surgical History:  Procedure Laterality Date   CATARACT EXTRACTION  02/2011   left eye   TUBAL LIGATION  1973   VULVA /PERINEUM BIOPSY  2010   for hypopigmentation, non-specific result   Social History   Socioeconomic History   Marital status: Married    Spouse name: Fayrene Fearing   Number of children: 1   Years of education: 18   Highest education level: Master's degree (e.g., MA, MS, MEng, MEd, MSW, MBA)  Occupational History   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  Tobacco Use   Smoking status: Former    Current packs/day: 0.00    Average packs/day: 0.5 packs/day for 15.0 years (7.5 ttl pk-yrs)    Types: Cigarettes    Start date: 04/03/1979    Quit date: 04/02/1994    Years since quitting: 28.7   Smokeless tobacco: Never  Vaping Use   Vaping status: Never Used  Substance and Sexual Activity   Alcohol use: No   Drug use: No   Sexual activity: Not Currently    Partners: Male    Birth control/protection: Surgical    Comment: BTSP  Other Topics Concern   Not on file  Social History Narrative   Patient lives with husband in [place].    Patient has one son and two grandchildren who live nearby.   Patient enjoys seeing her grandchildren grow up and watching their sporting events.    Patient enjoys reading and her book club, crossword puzzles, and shopping.   Husband in remission for multiple myeloma and doing well.          Social Determinants of Health   Financial Resource Strain: Low Risk  (09/26/2022)   Overall Financial Resource Strain (CARDIA)    Difficulty of Paying Living Expenses: Not hard at all  Food Insecurity: No Food Insecurity (09/26/2022)   Hunger Vital Sign    Worried About Running Out of Food in the Last Year: Never true    Ran Out of Food in the Last Year: Never true  Transportation Needs: No Transportation  Needs (09/26/2022)   PRAPARE - Administrator, Civil Service (Medical): No    Lack of Transportation (Non-Medical): No  Physical Activity: Inactive (07/30/2022)   Exercise Vital Sign    Days of Exercise per Week: 0 days    Minutes of Exercise per Session: 0 min  Stress: No Stress Concern Present (07/30/2022)   [employer] of Occupational Health - Occupational Stress Questionnaire    Feeling of Stress : Not at all  Social Connections: Moderately Integrated (07/30/2022)   Social Connection and Isolation Panel [NHANES]    Frequency of Communication with Friends and Family: Three times a week    Frequency of Social Gatherings with Friends and Family: Twice a week    Attends Religious Services: Never    Database administrator or Organizations: No    Attends Engineer, structural: 1 to 4 times per year    Marital Status: Married  Catering manager Violence: Not At Risk (07/30/2022)   Humiliation, Afraid, Rape, and Kick questionnaire    Fear of Current or Ex-Partner: No    Emotionally Abused: No    Physically Abused: No    Sexually Abused: No   Current Outpatient Medications on File Prior to Visit  Medication Sig Dispense Refill   acetaminophen (TYLENOL) 650 MG CR tablet Take 1,300 mg by mouth 3 (three) times daily.     amLODipine (NORVASC) 10 MG tablet TAKE 1 TABLET(10 MG) BY MOUTH AT BEDTIME 90 tablet 1   aspirin EC 81 MG tablet Take 81 mg by mouth daily.     brimonidine-timolol (COMBIGAN) 0.2-0.5 % ophthalmic solution Place 2 drops into both eyes every 12 (twelve) hours.     Cholecalciferol 25 MCG (1000 UT) capsule Take 2,000 Units by mouth daily.     Diclofenac Sodium 3 % GEL Apply four times a day on the painful area at the base of your neck 100 g 3   gabapentin (NEURONTIN) 400 MG capsule Take 1 capsule (400 mg total) by mouth 3 (three) times daily. 90 capsule 0   latanoprost (XALATAN) 0.005 % ophthalmic solution Place 1 drop into both eyes at bedtime.      losartan (COZAAR) 100 MG tablet Take 1 tablet (100 mg total) by mouth daily. 90 tablet 2   Omega-3 Fatty Acids (FISH OIL PO) Take 1 capsule by mouth daily.     No current facility-administered medications on file prior to visit.   Allergies  Allergen Reactions   Sulfamethoxazole-Trimethoprim     REACTION: rash: nausea, swelling   Hctz [Hydrochlorothiazide] Other (See Comments)    Elevated calcium   Family History  Problem Relation Age of Onset   Stroke Mother 27       cerebral hemorrhage   Hypertension Mother    Hypertension Father    Heart disease Father    Breast cancer Sister        3   Breast cancer Sister 22   Breast cancer Maternal Aunt     PE: BP (!) 140/80   Pulse 71   Ht 5\' 5"  (1.651 m)   Wt 175 lb 12.8 oz (79.7 kg)   LMP 04/02/1996   SpO2 99%   BMI 29.25 kg/m  Wt Readings  from Last 3 Encounters:  12/13/22 175 lb 12.8 oz (79.7 kg)  11/13/22 174 lb 9.6 oz (79.2 kg)  10/23/22 144 lb 12.8 oz (65.7 kg)   Constitutional: overweight, in NAD Eyes:  EOMI, no exophthalmos ENT: no neck masses, no cervical lymphadenopathy Cardiovascular: RRR, No MRG Respiratory: CTA B Musculoskeletal: no deformities Skin:no rashes Neurological: no tremor with outstretched hands  Assessment: 1. Hypercalcemia/hyperparathyroidism  2.  Thyroid nodule  Plan: Patient with history of elevated total calcium, with the highest being 11.4.  An intact PTH was not suppressed, at 41, or calcium of 10.6.   -She does not have a documented history of vitamin D deficiency.  She is on 2000 units vitamin D daily.  Latest level was normal: Lab Results  Component Value Date   VD25OH 35.33 04/17/2022  -She has no apparent complications from hypercalcemia: No nephrolithiasis, osteoporosis (per DXA scan report from 05/2021), fractures, abdominal pain, bone pain, depression -We previously had to stop her HCTZ and multivitamin to further investigate her hypercalcemia.  Her calcium levels were  fluctuating in the past, they were not consistently elevated as expected from familial hypocalciuric hypercalcemia.  Also, her magnesium level was not elevated.  Her 24-hour urine calcium was close to the lower limit of the target range.  I could not calculate a fractional excretion of calcium at the creatinine level was not obtained at the time of her urine collection, however, my suspicion for FHH is not very high -We checked ionized calcium levels for her and they were repeatedly normal, in 2021, 2023, and 2024.   -She was admitted for hypertensive emergency and altered mental status 09/23/2022.  She had elevated total calcium levels around that time, up to 10.5 and she had another level of 11.1 obtained in 10/2022. -At today's visit we discussed about rechecking an ionized calcium along with a vitamin D level.  I also gave her a urine jug for 24-hour urine collection and advised her that we may need to repeat this.  Given instruction about correct collection. -We did discuss about possible consequences of hyperparathyroidism: Osteoporosis, nephrolithiasis, however, new studies show that mild hyperparathyroidism may be stable for many years and without significant complications.  This being said, if the calcium continues to increase, she may benefit from a referral to surgery -Reviewed criteria for surgery Increased calcium by 1 mg/dL above the upper limit of normal -previously, not recently Kidney ds.  Osteoporosis (or vertebral fracture) Age <13 years old Newer criteria (2013): High UCa >400 mg/d and increased stone risk by biochemical stone risk analysis Presence of nephrolithiasis or nephrocalcinosis Pt's preference!  -I will see her back in 6 months  2.  Thyroid nodule -No neck compression symptoms -Reviewed the report of her thyroid ultrasound from 2007: She had a small, measuring 8 mm in the largest dimension, not worrisome.  We discussed that no follow-up is needed for this nodule  especially if she last not have risk factors for thyroid cancer (no family history of the disease or exposure to radiation) -Normal felt on palpation of her neck today and no neck compression symptoms -I did review her latest thyroid test and this was normal: Lab Results  Component Value Date   TSH 1.46 03/22/2020   Component     Latest Ref Rng 12/13/2022  Calcium     8.7 - 10.3 mg/dL 16.1 (H)   PTH, Intact     15 - 65 pg/mL 33   PTH Interp Comment   VITD  30.00 - 100.00 ng/mL 41.25   Calcium Ionized     4.7 - 5.5 mg/dL 5.7 (H)   Total and ionized calcium slightly elevated, along with a normal vitamin D and a nonsuppressed PTH. At this point, I will have her perform the 24-hour urine collection and bring the jug to the clinic.  At that time, I will repeat a BMP and a calcitriol level.  Component     Latest Ref Rng 12/17/2022  Glucose     70 - 99 mg/dL 84   BUN     6 - 23 mg/dL 14   Creatinine     4.09 - 1.20 mg/dL 8.11   Sodium     914 - 145 mEq/L 141   Potassium     3.5 - 5.1 mEq/L 3.8   Chloride     96 - 112 mEq/L 104   CO2     19 - 32 mEq/L 29   Calcium     8.4 - 10.5 mg/dL 78.2 (H)   Calcium, 95A Urine     mg/24 h 42   Creatinine, 24H Ur     0.50 - 2.15 g/24 h 0.96   GFR     >60.00 mL/min 59.98 (L)   FE Ca 0.00375 -not consistent with primary hyperparathyroidism.  Since hypercalcemia is mild, my suggestion for now would be to continue to follow it conservatively  Carlus Pavlov, MD PhD [facility]

## 2022-12-13 NOTE — Patient Instructions (Addendum)
Please stop at the lab.  Continue 2000 units vit D daily.  Please come back for a follow-up appointment in 6 months.

## 2022-12-14 ENCOUNTER — Encounter: Payer: Self-pay | Admitting: Family Medicine

## 2022-12-14 LAB — PTH, INTACT AND CALCIUM
Calcium: 10.6 mg/dL — ABNORMAL HIGH (ref 8.7–10.3)
PTH: 33 pg/mL (ref 15–65)

## 2022-12-14 LAB — CALCIUM, IONIZED: Calcium, Ion: 5.7 mg/dL — ABNORMAL HIGH (ref 4.7–5.5)

## 2022-12-17 ENCOUNTER — Other Ambulatory Visit (INDEPENDENT_AMBULATORY_CARE_PROVIDER_SITE_OTHER): Payer: Medicare Other

## 2022-12-17 LAB — BASIC METABOLIC PANEL
BUN: 14 mg/dL (ref 6–23)
CO2: 29 meq/L (ref 19–32)
Calcium: 10.6 mg/dL — ABNORMAL HIGH (ref 8.4–10.5)
Chloride: 104 meq/L (ref 96–112)
Creatinine, Ser: 0.91 mg/dL (ref 0.40–1.20)
GFR: 59.98 mL/min — ABNORMAL LOW (ref >60.00)
Glucose, Bld: 84 mg/dL (ref 70–99)
Potassium: 3.8 meq/L (ref 3.5–5.1)
Sodium: 141 meq/L (ref 135–145)

## 2022-12-18 LAB — CREATININE, URINE, 24 HOUR: Creatinine, 24H Ur: 0.96 g/(24.h) (ref 0.50–2.15)

## 2022-12-18 LAB — CALCIUM, URINE, 24 HOUR: Calcium, 24H Urine: 42 mg/(24.h)

## 2022-12-21 LAB — VITAMIN D 1,25 DIHYDROXY
Vitamin D 1, 25 (OH)2 Total: 30 pg/mL (ref 18–72)
Vitamin D2 1, 25 (OH)2: <8 pg/mL
Vitamin D3 1, 25 (OH)2: 30 pg/mL

## 2023-01-08 ENCOUNTER — Other Ambulatory Visit: Payer: Self-pay

## 2023-01-10 ENCOUNTER — Other Ambulatory Visit: Payer: Self-pay | Admitting: Family Medicine

## 2023-01-11 MED ORDER — AMLODIPINE BESYLATE 10 MG PO TABS: ORAL_TABLET | ORAL | 1 refills | Status: DC

## 2023-01-27 DIAGNOSIS — Z23 Encounter for immunization: Secondary | ICD-10-CM | POA: Diagnosis not present

## 2023-01-30 NOTE — Progress Notes (Signed)
77 y.o. G1P1 Married [place] American female here for breast and pelvic exam.   Followed for lichen sclerosus.  Not using Valisone.   She is incontinent of urine for 2 years.  Wearing Depends.   She tried Myrbetriq, through her PCP, which was not effective.   No vaginal bleeding.   Had hospital visit in June after being found unconscious on her floor at home.  Her blood pressure was significantly elevated.  Her blood pressure medications have been modified.   Husband has also been hospitalized and in rehab.  She is his caregiver.  She is using a cane.   Just had her Covid vaccine.   PCP: Latrelle Dodrill, MD   [name] last menstrual period was 04/02/1996.           Sexually active: No.  The current method of family planning is tubal ligation.    Exercising: No.   Smoker:  former  OB History  Gravida Para Term Preterm AB Living  1 1       1   SAB IAB Ectopic Multiple Live Births          1    # Outcome Date GA Lbr Len/2nd Weight Sex Type Anes PTL Lv  1 Para     M Vag-Spont   LIV     Health Maintenance: Pap:  12/24/18 neg: HR HPV neg, 12/13/15 WNL History of abnormal Pap:  no MMG: 12/14/22 Breast Density Cat B, BI-RADS CAT 1 neg Colonoscopy:   [facility] Colonoscopy          Discontinued - Colonoscopy  Discontinued      Frequency changed to Never automatically (Topic No Longer Applies)   06/09/2014  [facility] COLONOSCOPY   Only the first 1 history entries have been loaded, but more history exists.           BMD:  05/03/21  Result  WNL  HIV: n/a Hep C: 01/06/15 neg  Immunization History  Administered Date(s) Administered   Fluad Quad(high Dose 65+) 12/15/2018, 01/08/2020, 01/03/2021, 01/04/2022, 01/27/2023   Influenza Split 02/20/2011, 01/16/2012   Influenza Whole 01/17/2009, 01/10/2010   Influenza, High Dose Seasonal PF 01/01/2017, 01/17/2018   Influenza,inj,Quad PF,6+ Mos 12/26/2012, 12/21/2013, 01/06/2015, 01/05/2016   Influenza-Unspecified 12/25/2016    PFIZER(Purple Top)SARS-COV-2 Vaccination 04/22/2019, 05/13/2019, 01/28/2020, 09/07/2020   Pfizer Covid-19 Vaccine Bivalent Booster 71yrs & up 01/18/2021   Pfizer(Comirnaty)Fall Seasonal Vaccine 12 years and older 02/11/2023   Pneumococcal Conjugate-13 07/09/2013   Pneumococcal Polysaccharide-23 07/13/2008   Td 04/02/2004   Tdap 04/02/2006, 09/02/2017   Zoster Recombinant(Shingrix) 06/03/2020, 08/09/2020   Zoster, Live 04/02/2012      reports that she quit smoking about 28 years ago. Her smoking use included cigarettes. She started smoking about 43 years ago. She has a 7.5 pack-year smoking history. She has never used smokeless tobacco. She reports that she does not drink alcohol and does not use drugs.  Past Medical History:  Diagnosis Date   Allergic rhinitis 07/19/2014   Arthritis of knee, right 03/11/2006   Bursitis of shoulder, right 09/19/1999   Cervical radiculopathy at C6 05/24/2011   Beginning Nov 2012. Multi-level foraminal narrowing in C-spine plain films    Diverticulosis    DIVERTICULOSIS OF COLON 05/30/2006   Colonoscopy 06/2014 - multiple diverticula, no other abnormalities, no further colonoscopies recommended given patient's age of 37     GERD (gastroesophageal reflux disease) 07/19/2014   Diagnosed clinically by [facility] in January of 2016.     Glaucoma  Hearing decreased 08/07/2011   With whooshing tinnitus Mild bilateral loss on 08/2012 audiology who will recheck her hearing in one year     History of herniated intervertebral disc    Hypertension    HYPERTENSION, BENIGN SYSTEMIC 05/30/2006        Left sided sciatica 8/95,11/00   CT confirmed    LOW BACK PAIN SYNDROME 05/10/2009   Qualifier: Diagnosis of  By: Lorenda Hatchet CMA,, [place]  Mild C 3-4, 4-5 lumbar spinal stenosis MRI 08/2011, Diffuse lower thoracic and lumbar DDD    Mitral valve prolapse    per patient heart murmur   Orthostatic hypotension 06/12/2013   Osteoarthritis    Osteoarthritis of right knee  12/26/2012   Confirmed on xray 07/2014    OSTEOARTHRITIS, MULTI SITES 05/30/2006   She is stoic about pain and wants to avoid surgeries Past xrays have documented DJD in right knee and lumbar spine     Overweight (BMI 25.0-29.9) 05/30/2006   Pitting edema 07/19/2014   Bilateral 1+ pitting edema of calves first noted on 07/19/2014.     Sciatica 05/30/2006   Qualifier: Diagnosis of  By: Sheffield Slider MD, Wayne  Involved left leg in 02/1999    Swallowing difficulty 07/06/2013   Urge incontinence 06/01/2008   Qualifier: Diagnosis of  By: Sheffield Slider MD, Deniece Portela      Past Surgical History:  Procedure Laterality Date   CATARACT EXTRACTION  02/2011   left eye   TUBAL LIGATION  1973   VULVA /PERINEUM BIOPSY  2010   for hypopigmentation, non-specific result    Current Outpatient Medications  Medication Sig Dispense Refill   acetaminophen (TYLENOL) 650 MG CR tablet Take 1,300 mg by mouth 3 (three) times daily.     amLODipine (NORVASC) 10 MG tablet TAKE 1 TABLET(10 MG) BY MOUTH AT BEDTIME 90 tablet 1   aspirin EC 81 MG tablet Take 81 mg by mouth daily.     brimonidine-timolol (COMBIGAN) 0.2-0.5 % ophthalmic solution Place 2 drops into both eyes every 12 (twelve) hours.     Cholecalciferol 25 MCG (1000 UT) capsule Take 2,000 Units by mouth daily.     Diclofenac Sodium 3 % GEL Apply four times a day on the painful area at the base of your neck 100 g 3   latanoprost (XALATAN) 0.005 % ophthalmic solution Place 1 drop into both eyes at bedtime.     losartan (COZAAR) 100 MG tablet Take 1 tablet (100 mg total) by mouth daily. 90 tablet 2   Omega-3 Fatty Acids (FISH OIL PO) Take 1 capsule by mouth daily.     gabapentin (NEURONTIN) 400 MG capsule Take 1 capsule (400 mg total) by mouth 3 (three) times daily. 90 capsule 0   No current facility-administered medications for this visit.    Family History  Problem Relation Age of Onset   Stroke Mother 52       cerebral hemorrhage   Hypertension Mother    Hypertension Father     Heart disease Father    Breast cancer Sister        11   Breast cancer Sister 42   Breast cancer Maternal Aunt     Review of Systems  All other systems reviewed and are negative.   Exam:   BP 124/72 (BP Location: Left Arm, Patient Position: Sitting, Cuff Size: Normal)   Pulse (!) 55   Ht 5\' 5"  (1.651 m)   Wt 178 lb (80.7 kg)   LMP 04/02/1996   SpO2 99%  BMI 29.62 kg/m     General appearance: alert, cooperative and appears stated age Breasts: normal appearance, no masses or tenderness, No nipple retraction or dimpling, No nipple discharge or bleeding, No axillary adenopathy Abdomen: soft, non-tender; no masses, no organomegaly  Pelvic: External genitalia:  hypopigmentation of the vulva.              No abnormal inguinal nodes palpated.              Urethra:  normal appearing urethra with no masses, tenderness or lesions              Bartholins and Skenes: normal                 Vagina: normal appearing vagina with normal color and discharge, no lesions              Cervix: no lesions              Pap taken: No. Bimanual Exam:  Uterus:  normal size, contour, position, consistency, mobility, non-tender              Adnexa: no mass, fullness, tenderness              Rectal exam: Yes.  .  Confirms.              Anus:  normal sphincter tone, no lesions  Chaperone was present for exam:  Warren Lacy, CMA   Assessment:  Encounter for breast and pelvic exam.  Hypopigmentation of the vulva.  Prior dx of lichen sclerosus.  Looks like vitiligo.  Urinary incontinence.  FH breast cancer - two sisters, one paternal aunt.  Hx elevated calcium level.  Normal PTH.  Plan: Mammogram screening discussed. Self breast awareness reviewed. Patient may stop doing pap smears. Guidelines for Calcium, Vitamin D, regular exercise program including cardiovascular and weight bearing exercise. Refill of Valisone ointment.   We talked about possible Gemtesa through her PCP.  Written information  given.  FU in 2 years and prn.  She prefers to continue visits here.  Will request a lift table for her visits.  She required two people to assist getting on and off the regular exam table today.  30 min  total time was spent for this patient encounter, including preparation, face-to-face counseling with the patient, coordination of care, and documentation of the encounter in addition to doing the breast and pelvic exam.

## 2023-02-11 ENCOUNTER — Ambulatory Visit (INDEPENDENT_AMBULATORY_CARE_PROVIDER_SITE_OTHER): Payer: Medicare Other

## 2023-02-11 DIAGNOSIS — Z23 Encounter for immunization: Secondary | ICD-10-CM

## 2023-02-11 NOTE — Progress Notes (Signed)
Patient presents to nurse clinic for Covid vaccine.  Vaccine administered in LD without complication.   She reports interest in RSV vaccine. However, she is unsure if this is necessary for her to receive. She reports she is willing to take it, but doesn't want to take anything she may not need.   Advised will forward this encounter to her PCP and will call her.

## 2023-02-13 ENCOUNTER — Ambulatory Visit (INDEPENDENT_AMBULATORY_CARE_PROVIDER_SITE_OTHER): Payer: Medicare Other | Admitting: Obstetrics and Gynecology

## 2023-02-13 ENCOUNTER — Encounter: Payer: Self-pay | Admitting: Obstetrics and Gynecology

## 2023-02-13 VITALS — BP 124/72 | HR 55 | Ht 65.0 in | Wt 178.0 lb

## 2023-02-13 DIAGNOSIS — R32 Unspecified urinary incontinence: Secondary | ICD-10-CM

## 2023-02-13 DIAGNOSIS — Z5181 Encounter for therapeutic drug level monitoring: Secondary | ICD-10-CM | POA: Diagnosis not present

## 2023-02-13 DIAGNOSIS — N904 Leukoplakia of vulva: Secondary | ICD-10-CM

## 2023-02-13 DIAGNOSIS — Z01419 Encounter for gynecological examination (general) (routine) without abnormal findings: Secondary | ICD-10-CM

## 2023-02-13 MED ORDER — BETAMETHASONE VALERATE 0.1 % EX OINT: TOPICAL_OINTMENT | CUTANEOUS | 0 refills | Status: AC

## 2023-02-13 NOTE — Patient Instructions (Addendum)
EXERCISE AND DIET:  We recommended that you start or continue a regular exercise program for good health. Regular exercise means any activity that makes your heart beat faster and makes you sweat.  We recommend exercising at least 30 minutes per day at least 3 days a week, preferably 4 or 5.  We also recommend a diet low in fat and sugar.  Inactivity, poor dietary choices and obesity can cause diabetes, heart attack, stroke, and kidney damage, among others.    ALCOHOL AND SMOKING:  Women should limit their alcohol intake to no more than 7 drinks/beers/glasses of wine (combined, not each!) per week. Moderation of alcohol intake to this level decreases your risk of breast cancer and liver damage. And of course, no recreational drugs are part of a healthy lifestyle.  And absolutely no smoking or even second hand smoke. Most people know smoking can cause heart and lung diseases, but did you know it also contributes to weakening of your bones? Aging of your skin?  Yellowing of your teeth and nails?  CALCIUM AND VITAMIN D:  Adequate intake of calcium and Vitamin D are recommended.  The recommendations for exact amounts of these supplements seem to change often, but generally speaking 600 mg of calcium (either carbonate or citrate) and 800 units of Vitamin D per day seems prudent. Certain women may benefit from higher intake of Vitamin D.  If you are among these women, your doctor will have told you during your visit.    PAP SMEARS:  Pap smears, to check for cervical cancer or precancers,  have traditionally been done yearly, although recent scientific advances have shown that most women can have pap smears less often.  However, every woman still should have a physical exam from her gynecologist every year. It will include a breast check, inspection of the vulva and vagina to check for abnormal growths or skin changes, a visual exam of the cervix, and then an exam to evaluate the size and shape of the uterus and  ovaries.  And after 79 years of age, a rectal exam is indicated to check for rectal cancers. We will also provide age appropriate advice regarding health maintenance, like when you should have certain vaccines, screening for sexually transmitted diseases, bone density testing, colonoscopy, mammograms, etc.   MAMMOGRAMS:  All women over 29 years old should have a yearly mammogram. Many facilities now offer a "3D" mammogram, which may cost around $50 extra out of pocket. If possible,  we recommend you accept the option to have the 3D mammogram performed.  It both reduces the number of women who will be called back for extra views which then turn out to be normal, and it is better than the routine mammogram at detecting truly abnormal areas.    COLONOSCOPY:  Colonoscopy to screen for colon cancer is recommended for all women at age 3.  We know, you hate the idea of the prep.  We agree, BUT, having colon cancer and not knowing it is worse!!  Colon cancer so often starts as a polyp that can be seen and removed at colonscopy, which can quite literally save your life!  And if your first colonoscopy is normal and you have no family history of colon cancer, most women don't have to have it again for 10 years.  Once every ten years, you can do something that may end up saving your life, right?  We will be happy to help you get it scheduled when you are ready.  Be sure to check your insurance coverage so you understand how much it will cost.  It may be covered as a preventative service at no cost, but you should check your particular policy.      Vibegron Tablets What is this medication? VIBEGRON (vye BEG ron) treats symptoms of an overactive bladder, such as loss of bladder control or frequent need to urinate. It works by relaxing muscles in the bladder. It belongs to a group of medications called antispasmodics. This medicine may be used for other purposes; ask your health care provider or pharmacist if you have  questions. COMMON BRAND NAME(S): GEMTESA What should I tell my care team before I take this medication? They need to know if you have any of these conditions: Kidney disease Liver disease Prostate disease Trouble passing urine An unusual or allergic reaction to vibegron, other medications, foods, dyes, or preservatives Pregnant or trying to get pregnant Breast-feeding How should I use this medication? Take this medication by mouth with water. Take it as directed on the prescription label at the same time every day. Swallow the tablets whole. You may crush the tablet and mix with 1 tablespoon (15 mL) of applesauce. Swallow the medication and applesauce right away. Keep taking it unless your care team tells you to stop. You can take it with or without food. If it upsets your stomach, take it with food. Talk to your care team about the use of this medication in children. Special care may be needed. Overdosage: If you think you have taken too much of this medicine contact a poison control center or emergency room at once. NOTE: This medicine is only for you. Do not share this medicine with others. What if I miss a dose? If you miss a dose, take it as soon as you can. If it is almost time for your next dose, take only that dose. Do not take double or extra doses. What may interact with this medication? This medication may interact with the following: Digoxin This list may not describe all possible interactions. Give your health care provider a list of all the medicines, herbs, non-prescription drugs, or dietary supplements you use. Also tell them if you smoke, drink alcohol, or use illegal drugs. Some items may interact with your medicine. What should I watch for while using this medication? Visit your care team for regular checks on your progress. Tell your care team if your symptoms do not start to get better or if they get worse. You may need to limit your intake of tea, coffee, caffeinated  drinks, or alcohol. These drinks may make your symptoms worse. What side effects may I notice from receiving this medication? Side effects that you should report to your care team as soon as possible: Allergic reactions--skin rash, itching, hives, swelling of the face, lips, tongue, or throat Trouble passing urine Side effects that usually do not require medical attention (report to your care team if they continue or are bothersome): Diarrhea Headache Nausea Runny or stuffy nose Sore throat This list may not describe all possible side effects. Call your doctor for medical advice about side effects. You may report side effects to FDA at 1-800-FDA-1088. Where should I keep my medication? Keep out of the reach of children and pets. Store at room temperature between 20 and 25 degrees C (68 and 77 degrees F). Get rid of any unused medication after the expiration date. To get rid of medications that are no longer needed or have expired: Take  the medication to a medication take-back program. Check with your pharmacy or law enforcement to find a location. If you cannot return the medication, check the label or package insert to see if the medication should be thrown out in the garbage or flushed down the toilet. If you are not sure, ask your care team. If it is safe to put it in the trash, empty the medication out of the container. Mix the medication with cat litter, dirt, coffee grounds, or other unwanted substance. Seal the mixture in a bag or container. Put it in the trash. NOTE: This sheet is a summary. It may not cover all possible information. If you have questions about this medicine, talk to your doctor, pharmacist, or health care provider.  2024 Elsevier/Gold Standard (2021-04-10 00:00:00)

## 2023-02-25 ENCOUNTER — Ambulatory Visit (INDEPENDENT_AMBULATORY_CARE_PROVIDER_SITE_OTHER): Payer: Medicare Other | Admitting: Orthopedic Surgery

## 2023-02-25 DIAGNOSIS — M4712 Other spondylosis with myelopathy, cervical region: Secondary | ICD-10-CM

## 2023-02-25 NOTE — Progress Notes (Signed)
Orthopedic Spine Surgery Office Note   Assessment: Patient is a 77 y.o. female with signs and symptoms consistent with progressive cervical spondylotic myelopathy.  Has central stenosis at C3/4 and C4/5.  She also has foraminal stenosis at multiple levels that would explain her radicular type pain.     Plan: -[name] exam and symptoms are stable, so we will continue to drip for now -She has found heat and Salonpas helpful for her neck pain so I told her to keep using those treatments -Patient should return to office in 12 weeks, x-rays at next visit: none     Patient expressed understanding of the plan and all questions were answered to the patient's satisfaction.    ___________________________________________________________________________     History:   Patient is a 77 y.o. female who has previously been seen in the office for symptoms of myeloradiculopathy.  Since she was last seen, her arm pain has improved.  She also is not having as much neck pain.  She states she has good days and bad days.  She said that the good days outnumber bad days.  She finds that heat and Salonpas over the neck have been helpful.  She still has some imbalance and difficulty with fine motor skills in the hand but it has not changed since our last visit.  She denies any new symptoms since she was last seen.   Treatments tried: cane use, Tylenol, salonpas, heating pads     Physical Exam:   General: no acute distress, appears stated age Neurologic: alert, answering questions appropriately, following commands Respiratory: unlabored breathing on room air, symmetric chest rise Psychiatric: appropriate affect, normal cadence to speech     MSK (spine):   -Strength exam                                                   Left                  Right Grip strength                5/5                  5/5 Interosseus                  5/5                  5/5 Wrist extension            5/5                   5/5 Wrist flexion                 5/5                  5/5 Elbow flexion                5/5                  5/5 Deltoid                          5/5                  5/5   EHL  4/5                  4/5 TA                                 5/5                  5/5 GSC                             5/5                  5/5 Knee extension            5/5                  5/5 Hip flexion                    4/5                  4/5   -Sensory exam                           Sensation intact to light touch in L3-S1 nerve distributions of bilateral lower extremities             Sensation intact to light touch in C5-T1 nerve distributions of bilateral upper extremities   -Brachioradialis DTR: 2/4 on the left, 2/4 on the right -Biceps DTR: 2/4 on the left, 2/4 on the right -Achilles DTR: 2/4 on the left, 2/4 on the right -Patellar tendon DTR: 2/4 on the left, 2/4 on the right   -Spurling: Negative bilaterally -Hoffman sign: Negative bilaterally -Clonus: No beats bilaterally -Interosseous wasting: None seen -Grip and release test: Positive -Romberg: Positive -Gait: Wide-based and uses cane -Imbalance with tandem gait: Yes   Imaging: XR of the cervical spine from 04/09/2022 and 05/30/2022 was previously independently reviewed and interpreted, showing multilevel spondylosis with disc height loss and anterior osteophyte formation.  Spondylolisthesis at C3/4 that shifts about 1 mm between flexion/extension views.  No fracture or dislocation seen.   MRI of the cervical spine from 05/02/2022 was previously independently reviewed and interpreted, showing central stenosis at C3 3/4 and C4/5.  Foraminal stenosis on the right at C2/3, bilateral foraminal stenosis at C3/4, bilateral foraminal stenosis at C4/5, bilateral foraminal stenosis at C5/6, and bilateral foraminal stenosis at C6/7.  No T2 cord signal change seen.     Patient name: Laura Griffin Patient MRN:  [id number] Date of visit: 02/25/23

## 2023-04-10 ENCOUNTER — Ambulatory Visit: Payer: Medicare Other | Admitting: Orthopedic Surgery

## 2023-04-10 DIAGNOSIS — M4712 Other spondylosis with myelopathy, cervical region: Secondary | ICD-10-CM

## 2023-04-10 DIAGNOSIS — M1711 Unilateral primary osteoarthritis, right knee: Secondary | ICD-10-CM | POA: Diagnosis not present

## 2023-04-10 NOTE — Progress Notes (Signed)
 Orthopedic Spine Surgery Office Note   Assessment: Patient is a 78 y.o. female with signs and symptoms consistent with cervical spondylotic myelopathy.  Has central stenosis at C3/4 and C4/5.  She also has foraminal stenosis at multiple levels that would explain her radicular type pain.     Plan: -I went over the fact that her progressive trouble with fine motor skills in the hand and balance is consistent with cervical myelopathy.  I again discussed the natural history of cervical myelopathy which consists of a stepwise decline in function.  I recommended surgery to decompress the spinal cord.  Patient was not interested in surgical management given her age.  I explained that surgery does not predictably reverse any symptoms of myelopathy so if it does progress as we continue to monitor, there is a chance that she can will not regain that function.  I told her that surgery is more done to prevent progression.  She expressed understanding and wanted to continue to monitor.  She wanted to come back in 3 months. -In regards to her right knee, she has been previously diagnosed with osteoarthritis.  She got a injection with Dr. Addie in the past.  I told her that I could do an injection to that knee today if she would like.  She said it is not bad enough so she wants to hold off for now.    Patient expressed understanding of the plan and all questions were answered to the patient's satisfaction.    ___________________________________________________________________________     History:   Patient is a 78 y.o. female who has previously been seen in the office for symptoms of myeloradiculopathy.  Patient comes in today with worsening hand function.  She said she has noticed that her handwriting has gotten worse.  She said that it is illegible now.  She has been dropping objects.  She still notes persistent issues with balance and is using a cane.  She has not noticed any progression in terms of worsening  balance.  She has pain that radiates down the right upper extremity from the neck to the level of the wrist.  She also has decreased sensation distal to the elbow on the right side.  She has a several year history of urinary incontinence and uses depends.  She has not noticed any recent changes in urinary habits.  She has had no bowel incontinence or changes in bowel habits.  She also comes in today to talk about her right knee.  She has had right knee pain for the last 4 weeks.  She says she has had knee pain like this before.  She saw Dr. Addie.  She was told that she had osteoarthritis and was given an injection.  She said that injection helped for about 6 months.  She thinks that she overdid it recently with the holidays because her knee pain started around that time.  Pain is localized to the knee.  She feels it on the anterior and posterior aspects of the knee.  There is no trauma or injury that preceded the onset of pain.   Treatments tried: cane use, Tylenol , salonpas, heating pads     Physical Exam:   General: no acute distress, appears stated age Neurologic: alert, answering questions appropriately, following commands Respiratory: unlabored breathing on room air, symmetric chest rise Psychiatric: appropriate affect, normal cadence to speech     MSK (spine):   -Strength exam  Left                  Right Grip strength                5/5                  5/5 Interosseus                  5/5                  5/5 Wrist extension            5/5                  5/5 Wrist flexion                 5/5                  5/5 Elbow flexion                5/5                  5/5 Deltoid                          5/5                  5/5   EHL                              4/5                  4/5 TA                                 5/5                  5/5 GSC                             5/5                  5/5 Knee extension            5/5                   5/5 Hip flexion                    4/5                  4/5   -Sensory exam                           Sensation intact to light touch in L3-S1 nerve distributions of bilateral lower extremities             Sensation intact to light touch in C5-T1 nerve distributions of bilateral upper extremities   -Brachioradialis DTR: 2/4 on the left, 2/4 on the right -Biceps DTR: 2/4 on the left, 2/4 on the right -Achilles DTR: 2/4 on the left, 2/4 on the right -Patellar tendon DTR: 2/4 on the left, 2/4 on the right   -Spurling: Negative bilaterally -Hoffman sign: Negative bilaterally -Clonus: No beats bilaterally -Interosseous wasting: None seen -Grip and release test: Positive -Romberg:  Positive -Gait: Wide-based and uses cane -Imbalance with tandem gait: Yes   Imaging: XRs of the cervical spine from 04/09/2022 and 05/30/2022 were previously independently reviewed and interpreted, showing multilevel spondylosis with disc height loss and anterior osteophyte formation.  Spondylolisthesis at C3/4 that shifts about 1 mm between flexion/extension views.  No fracture or dislocation seen.   MRI of the cervical spine from 05/02/2022 was previously independently reviewed and interpreted, showing central stenosis at C3 3/4 and C4/5.  Foraminal stenosis on the right at C2/3, bilateral foraminal stenosis at C3/4, bilateral foraminal stenosis at C4/5, bilateral foraminal stenosis at C5/6, and bilateral foraminal stenosis at C6/7.  No T2 cord signal change seen.     Patient name: Laura Griffin Patient MRN: [id number] Date of visit: 04/10/23

## 2023-04-18 ENCOUNTER — Ambulatory Visit: Payer: 59 | Admitting: Internal Medicine

## 2023-05-13 ENCOUNTER — Ambulatory Visit (INDEPENDENT_AMBULATORY_CARE_PROVIDER_SITE_OTHER): Payer: Medicare Other | Admitting: Family Medicine

## 2023-05-13 ENCOUNTER — Encounter: Payer: Self-pay | Admitting: Family Medicine

## 2023-05-13 VITALS — BP 138/89 | HR 64 | Ht 65.5 in | Wt 176.0 lb

## 2023-05-13 DIAGNOSIS — R5383 Other fatigue: Secondary | ICD-10-CM

## 2023-05-13 DIAGNOSIS — M25552 Pain in left hip: Secondary | ICD-10-CM | POA: Diagnosis not present

## 2023-05-13 NOTE — Progress Notes (Signed)
  Date of Visit: 05/13/2023   SUBJECTIVE:   HPI:  Laura Griffin presents today for a couple of issues.  L hip pain - present for 1 week. Located over lateral L hip, radiates down to calf. No falls or injuries. Sleeps on L side.  Fatigue - feels out of it, tired. Doesn't feel depressed. Has had multiple stressful events over last few months - sister died, then last week brother had stroke. 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   OBJECTIVE:   BP 138/89   Pulse 64   Ht 5' 5.5" (1.664 m)   Wt 176 lb (79.8 kg)   LMP 04/02/1996   SpO2 97%   BMI 28.84 kg/m  Gen: no acute distress, pleasant, cooperative. Appears tired. HEENT: normocephalic, atraumatic  Heart: regular rate and rhythm, no murmur Lungs: clear to auscultation bilaterally, normal work of breathing  Neuro: alert. Speech normal. Grossly nonfocal Ext: tenderness to palpation of lateral L hip overlying greater trochanter. Full strength bilateral lower extremities. Sensation intact over bilateral lower extremities.   ASSESSMENT/PLAN:   Assessment & Plan Other fatigue Differential diagnosis includes mood issue, thyroid dysfunction, anemia, electrolyte disturbance. Patient denies feelings of depression or thoughts of harming self or others Potentially situational given the various hardships she has dealt with recently Provided supportive listening Check labs today - CBC, TSH, BMET to rule out organic causes Left hip pain Exam suspicious for greater trochanteric bursitis Discussed options for treatment including greater trochanteric bursa injection vs exercises vs seeing specialist Patient opted for exercises, handout given She will let me know if she wants to proceed with injection   [place] J. Pollie Meyer, MD [facility]

## 2023-05-13 NOTE — Patient Instructions (Signed)
 It was great to see you again today.  Checking labs See exercises below Let me know if you want to try shot in your hip  Be well, Dr. Dawn Eth   Hip Bursitis Rehab Ask your health care provider which exercises are safe for you. Do exercises exactly as told by your health care provider and adjust them as directed. It is normal to feel mild stretching, pulling, tightness, or discomfort as you do these exercises. Stop right away if you feel sudden pain or your pain gets worse. Do not begin these exercises until told by your health care provider. Stretching exercise This exercise warms up your muscles and joints and improves the movement and flexibility of your hip. This exercise also helps to relieve pain and stiffness. Iliotibial band stretch An iliotibial band is a strong band of muscle tissue that runs from the outer side of your hip to the outer side of your thigh and knee. Lie on your side with your left / right leg in the top position. Bend your left / right knee and grab your ankle. Stretch out your bottom arm to help you balance. Slowly bring your knee back so your thigh is slightly behind your body. Slowly lower your knee toward the floor until you feel a gentle stretch on the outside of your left / right thigh. If you do not feel a stretch and your knee will not lower more toward the floor, place the heel of your other foot on top of your knee and pull your knee down toward the floor with your foot. Hold this position for __________ seconds. Slowly return to the starting position. Repeat __________ times. Complete this exercise __________ times a day. Strengthening exercises These exercises build strength and endurance in your hip and pelvis. Endurance is the ability to use your muscles for a long time, even after they get tired. Bridge This exercise strengthens the muscles that move your thigh backward (hip extensors). Lie on your back on a firm surface with your knees bent and  your feet flat on the floor. Tighten your buttocks muscles and lift your buttocks off the floor until your trunk is level with your thighs. Do not arch your back. You should feel the muscles working in your buttocks and the back of your thighs. If you do not feel these muscles, slide your feet 1-2 inches (2.5-5 cm) farther away from your buttocks. If this exercise is too easy, try doing it with your arms crossed over your chest. Hold this position for __________ seconds. Slowly lower your hips to the starting position. Let your muscles relax completely after each repetition. Repeat __________ times. Complete this exercise __________ times a day. Squats This exercise strengthens the muscles in front of your thigh and knee (quadriceps). Stand in front of a table, with your feet and knees pointing straight ahead. You may rest your hands on the table for balance but not for support. Slowly bend your knees and lower your hips like you are going to sit in a chair. Keep your weight over your heels, not over your toes. Keep your lower legs upright so they are parallel with the table legs. Do not let your hips go lower than your knees. Do not bend lower than told by your health care provider. If your hip pain increases, do not bend as low. Hold the squat position for __________ seconds. Slowly push with your legs to return to standing. Do not use your hands to pull yourself to standing.  Repeat __________ times. Complete this exercise __________ times a day. Hip hike  Stand sideways on a bottom step. Stand on your left / right leg with your other foot unsupported next to the step. You can hold on to the railing or wall for balance if needed. Keep your knees straight and your torso square. Then lift your left / right hip up toward the ceiling. Hold this position for __________ seconds. Slowly let your left / right hip lower toward the floor, past the starting position. Your foot should get closer to  the floor. Do not lean or bend your knees. Repeat __________ times. Complete this exercise __________ times a day. Single leg stand This exercise increases your balance. Without shoes, stand near a railing or in a doorway. You may hold on to the railing or door frame as needed for balance. Squeeze your left / right buttock muscles, then lift up your other foot. Do not let your left / right hip push out to the side. It is helpful to stand in front of a mirror for this exercise so you can watch your hip. Hold this position for __________ seconds. Repeat __________ times. Complete this exercise __________ times a day. This information is not intended to replace advice given to you by your health care provider. Make sure you discuss any questions you have with your health care provider. Document Revised: 03/01/2021 Document Reviewed: 03/01/2021 Elsevier Patient Education  2024 [employer].

## 2023-05-14 LAB — CBC WITH DIFFERENTIAL/PLATELET
Basophils Absolute: 0 10*3/uL (ref 0.0–0.2)
Basos: 0 %
EOS (ABSOLUTE): 0.3 10*3/uL (ref 0.0–0.4)
Eos: 6 %
Hematocrit: 33.1 % — ABNORMAL LOW (ref 34.0–46.6)
Hemoglobin: 10.7 g/dL — ABNORMAL LOW (ref 11.1–15.9)
Immature Grans (Abs): 0 10*3/uL (ref 0.0–0.1)
Immature Granulocytes: 0 %
Lymphocytes Absolute: 1.3 10*3/uL (ref 0.7–3.1)
Lymphs: 28 %
MCH: 29.4 pg (ref 26.6–33.0)
MCHC: 32.3 g/dL (ref 31.5–35.7)
MCV: 91 fL (ref 79–97)
Monocytes Absolute: 0.4 10*3/uL (ref 0.1–0.9)
Monocytes: 9 %
Neutrophils Absolute: 2.7 10*3/uL (ref 1.4–7.0)
Neutrophils: 57 %
Platelets: 284 10*3/uL (ref 150–450)
RBC: 3.64 x10E6/uL — ABNORMAL LOW (ref 3.77–5.28)
RDW: 12.2 % (ref 11.7–15.4)
WBC: 4.6 10*3/uL (ref 3.4–10.8)

## 2023-05-14 LAB — BASIC METABOLIC PANEL
BUN/Creatinine Ratio: 15 (ref 12–28)
BUN: 15 mg/dL (ref 8–27)
CO2: 22 mmol/L (ref 20–29)
Calcium: 10.5 mg/dL — ABNORMAL HIGH (ref 8.7–10.3)
Chloride: 106 mmol/L (ref 96–106)
Creatinine, Ser: 0.98 mg/dL (ref 0.57–1.00)
Glucose: 96 mg/dL (ref 70–99)
Potassium: 3.7 mmol/L (ref 3.5–5.2)
Sodium: 143 mmol/L (ref 134–144)
eGFR: 59 mL/min/{1.73_m2} — ABNORMAL LOW (ref >59)

## 2023-05-14 LAB — TSH RFX ON ABNORMAL TO FREE T4: TSH: 1.92 u[IU]/mL (ref 0.450–4.500)

## 2023-05-24 ENCOUNTER — Telehealth: Payer: Self-pay | Admitting: Family Medicine

## 2023-05-24 DIAGNOSIS — D649 Anemia, unspecified: Secondary | ICD-10-CM

## 2023-05-24 NOTE — Telephone Encounter (Signed)
 Called patient to discuss labs Hgb mildly low at 10.7, normal MCV Will recheck CBC, also iron studies, B12, and folic acid Lab visit scheduled for next week Patient appreciative  Latrelle Dodrill, MD

## 2023-05-27 ENCOUNTER — Other Ambulatory Visit: Payer: Medicare Other

## 2023-05-27 DIAGNOSIS — D649 Anemia, unspecified: Secondary | ICD-10-CM | POA: Diagnosis not present

## 2023-05-29 ENCOUNTER — Ambulatory Visit: Payer: 59 | Admitting: Orthopedic Surgery

## 2023-05-29 LAB — IRON,TIBC AND FERRITIN PANEL
Ferritin: 157 ng/mL — ABNORMAL HIGH (ref 15–150)
Iron Saturation: 34 % (ref 15–55)
Iron: 92 ug/dL (ref 27–139)
Total Iron Binding Capacity: 269 ug/dL (ref 250–450)
UIBC: 177 ug/dL (ref 118–369)

## 2023-05-29 LAB — VITAMIN B12: Vitamin B-12: 304 pg/mL (ref 232–1245)

## 2023-05-29 LAB — CBC
Hematocrit: 35 % (ref 34.0–46.6)
Hemoglobin: 11.5 g/dL (ref 11.1–15.9)
MCH: 30.8 pg (ref 26.6–33.0)
MCHC: 32.9 g/dL (ref 31.5–35.7)
MCV: 94 fL (ref 79–97)
Platelets: 297 10*3/uL (ref 150–450)
RBC: 3.73 x10E6/uL — ABNORMAL LOW (ref 3.77–5.28)
RDW: 12.3 % (ref 11.7–15.4)
WBC: 3.4 10*3/uL (ref 3.4–10.8)

## 2023-05-29 LAB — FOLATE: Folate: 11 ng/mL (ref >3.0)

## 2023-06-06 ENCOUNTER — Ambulatory Visit: Payer: 59 | Admitting: Surgical

## 2023-06-06 ENCOUNTER — Other Ambulatory Visit (INDEPENDENT_AMBULATORY_CARE_PROVIDER_SITE_OTHER): Payer: Self-pay

## 2023-06-06 ENCOUNTER — Telehealth: Payer: Self-pay

## 2023-06-06 ENCOUNTER — Encounter: Payer: Self-pay | Admitting: Orthopedic Surgery

## 2023-06-06 DIAGNOSIS — G8929 Other chronic pain: Secondary | ICD-10-CM

## 2023-06-06 DIAGNOSIS — M25562 Pain in left knee: Secondary | ICD-10-CM | POA: Diagnosis not present

## 2023-06-06 DIAGNOSIS — M25561 Pain in right knee: Secondary | ICD-10-CM

## 2023-06-06 DIAGNOSIS — M170 Bilateral primary osteoarthritis of knee: Secondary | ICD-10-CM

## 2023-06-06 MED ORDER — METHYLPREDNISOLONE ACETATE 40 MG/ML IJ SUSP
40.0000 mg | INTRAMUSCULAR | Status: AC | PRN
Start: 2023-06-06 — End: 2023-06-06
  Administered 2023-06-06: 40 mg via INTRA_ARTICULAR

## 2023-06-06 MED ORDER — BUPIVACAINE HCL 0.25 % IJ SOLN
4.0000 mL | INTRAMUSCULAR | Status: AC | PRN
Start: 2023-06-06 — End: 2023-06-06
  Administered 2023-06-06: 4 mL via INTRA_ARTICULAR

## 2023-06-06 MED ORDER — LIDOCAINE HCL 1 % IJ SOLN
5.0000 mL | INTRAMUSCULAR | Status: AC | PRN
Start: 2023-06-06 — End: 2023-06-06
  Administered 2023-06-06: 5 mL

## 2023-06-06 MED ORDER — METHYLPREDNISOLONE ACETATE 40 MG/ML IJ SUSP
40.0000 mg | INTRAMUSCULAR | Status: AC | PRN
  Administered 2023-06-06: 40 mg via INTRA_ARTICULAR

## 2023-06-06 NOTE — Telephone Encounter (Signed)
Auth needed bilat knee gel  

## 2023-06-06 NOTE — Progress Notes (Signed)
 Office Visit Note   Patient: Laura Griffin           Date of Birth: 06/19/1945           MRN: [id number] Visit Date: 06/06/2023 Requested by: Latrelle Dodrill, MD [address] [place],  [place] [address] PCP: Latrelle Dodrill, MD  Subjective: Chief Complaint  Patient presents with   Right Knee - Pain   Left Knee - Pain    HPI: Laura Griffin is a 78 y.o. female who presents to the office reporting knee pain.  Has history of knee arthritis.  No new falls or injuries.  No fevers or chills.  Previous injections have provided good relief and they are here today for evaluation of chronic right knee pain and new left leg pain.  She describes left leg pain that initially began as just ambulatory pain about 4 to 5 months ago but has progressed to involve constant pain sometimes throughout the majority of the left lower extremity from her buttock down to her ankle.  Never involves the foot.  She has no numbness or tingling or burning sensation.  No prior history of hip or back surgery.  No recent falls.  No groin pain.  Previous gel injection back in November 2022 gave her great relief for her right knee pain and she would like to repeat this.              ROS: All systems reviewed are negative as they relate to the chief complaint within the history of present illness.  Patient denies fevers or chills.  Assessment & Plan: Visit Diagnoses:  1. Bilateral primary osteoarthritis of knee   2. Chronic pain of both knees     Plan: Patient is an 78 year old female who presents for evaluation of bilateral knee pain.  She has history of severe lateral compartment arthritis in the right knee that has responded great to prior gel injection in 2022.  She would like to repeat this.  Will inject the right knee with cortisone injection today while we get her set up for gel injection in the next few weeks.  Regarding the left knee, she has radiographs taken today demonstrating early  mild to moderate degenerative changes of the lateral compartment.  She has corresponding tenderness over the lateral joint line on exam today.  Think that this is responsible for at least some of her leg pain but the radicular nature of her leg pain may be more low back related.  After discussion of options, we will try aspirating and injecting the left knee as well and when she returns for gel injection if she had minimal relief of her left leg pain, next step would be MRI of the lumbar spine for further evaluation of radicular left leg symptoms.  This patient is diagnosed with osteoarthritis of the knee(s).    Radiographs show evidence of joint space narrowing, osteophytes, subchondral sclerosis and/or subchondral cysts.  This patient has knee pain which interferes with functional and activities of daily living.    This patient has experienced inadequate response, adverse effects and/or intolerance with conservative treatments such as acetaminophen, NSAIDS, topical creams, physical therapy or regular exercise, knee bracing and/or weight loss.   This patient has experienced inadequate response or has a contraindication to intra articular steroid injections for at least 3 months.   This patient is not scheduled to have a total knee replacement within 6 months of starting treatment with viscosupplementation.   Follow-Up  Instructions: No follow-ups on file.   Orders:  Orders Placed This Encounter  Procedures   XR KNEE 3 VIEW RIGHT   XR KNEE 3 VIEW LEFT   No orders of the defined types were placed in this encounter.     Procedures: Large Joint Inj: bilateral knee on 06/06/2023 1:45 PM Indications: diagnostic evaluation, joint swelling and pain Details: 18 G 1.5 in needle, superolateral approach  Arthrogram: No  Medications (Right): 5 mL lidocaine 1 %; 4 mL bupivacaine 0.25 %; 40 mg methylPREDNISolone acetate 40 MG/ML Medications (Left): 5 mL lidocaine 1 %; 4 mL bupivacaine 0.25 %; 40 mg  methylPREDNISolone acetate 40 MG/ML Aspirate (Left): 10 mL Outcome: tolerated well, no immediate complications Procedure, treatment alternatives, risks and benefits explained, specific risks discussed. Consent was given by the patient. Immediately prior to procedure a time out was called to verify the correct patient, procedure, equipment, support staff and site/side marked as required. Patient was prepped and draped in the usual sterile fashion.       Clinical Data: No additional findings.  Objective: Vital Signs: LMP 04/02/1996   Physical Exam:  Constitutional: Patient appears well-developed HEENT:  Head: Normocephalic Eyes:EOM are normal Neck: Normal range of motion Cardiovascular: Normal rate Pulmonary/chest: Effort normal Neurologic: Patient is alert Skin: Skin is warm Psychiatric: Patient has normal mood and affect  Ortho Exam: Ortho exam demonstrates knees without cellulitis or skin changes.  Effusion present in both knees.  No calf tenderness.  Negative Homans' sign.  No pain with hip range of motion.  Able to perform straight leg raise with both lower extremities.  Lower extremities warm and well-perfused.  Intact hip flexion, quadricep, hamstring, dorsiflexion, plantarflexion.  She has range of motion of 5 degrees extension to 105 degrees of knee flexion in the right knee and 2 degrees extension to 115 degrees in the left knee.  Tenderness over the lateral joint line moderate to severely in both knees.  No such tenderness over the medial joint line.    Specialty Comments:  No specialty comments available.  Imaging: No results found.   PMFS History: Patient Active Problem List   Diagnosis Date Noted   AMS (altered mental status) 09/23/2022   Stress incontinence 07/11/2021   Skin lesion 12/08/2020   Right-sided back pain 11/08/2020   Ganglion cyst of dorsum of right wrist 12/15/2018   Lichen sclerosus of female genitalia 09/25/2018    Class: Chronic    Hyperlipidemia 03/17/2018   Lichen planus 03/17/2018   Numbness and tingling of right leg 10/19/2016   Shoulder pain 03/01/2016   Headache 03/01/2016   Asymmetric SNHL (sensorineural hearing loss) 01/10/2016   Tinnitus of both ears 01/10/2016   Hearing loss 10/07/2015   Thyroid nodule 05/24/2015   Globus sensation 05/05/2015   Neck pain 01/07/2015   Allergic rhinitis 07/19/2014   GERD (gastroesophageal reflux disease) 07/19/2014   Pitting edema 07/19/2014   Swallowing difficulty 07/06/2013   Orthostatic hypotension 06/12/2013   Osteoarthritis of right knee 12/26/2012   GLAUCOMA 11/04/2009   DDD (degenerative disc disease), lumbar 05/10/2009   URGE INCONTINENCE 06/01/2008   Hypercalcemia 08/12/2007   Overweight (BMI 25.0-29.9) 05/30/2006   HYPERTENSION, BENIGN SYSTEMIC 05/30/2006   Osteoarthritis 05/30/2006   Past Medical History:  Diagnosis Date   Allergic rhinitis 07/19/2014   Arthritis of knee, right 03/11/2006   Bursitis of shoulder, right 09/19/1999   Cervical radiculopathy at C6 05/24/2011   Beginning Nov 2012. Multi-level foraminal narrowing in C-spine plain films    Diverticulosis  DIVERTICULOSIS OF COLON 05/30/2006   Colonoscopy 06/2014 - multiple diverticula, no other abnormalities, no further colonoscopies recommended given patient's age of 78     GERD (gastroesophageal reflux disease) 07/19/2014   Diagnosed clinically by [facility] in January of 2016.     Glaucoma    Hearing decreased 08/07/2011   With whooshing tinnitus Mild bilateral loss on 08/2012 audiology who will recheck her hearing in one year     History of herniated intervertebral disc    Hypertension    HYPERTENSION, BENIGN SYSTEMIC 05/30/2006        Left sided sciatica 8/95,11/00   CT confirmed    LOW BACK PAIN SYNDROME 05/10/2009   Qualifier: Diagnosis of  By: Lorenda Hatchet CMA,, [place]  Mild C 3-4, 4-5 lumbar spinal stenosis MRI 08/2011, Diffuse lower thoracic and lumbar DDD    Mitral valve prolapse     per patient heart murmur   Orthostatic hypotension 06/12/2013   Osteoarthritis    Osteoarthritis of right knee 12/26/2012   Confirmed on xray 07/2014    OSTEOARTHRITIS, MULTI SITES 05/30/2006   She is stoic about pain and wants to avoid surgeries Past xrays have documented DJD in right knee and lumbar spine     Overweight (BMI 25.0-29.9) 05/30/2006   Pitting edema 07/19/2014   Bilateral 1+ pitting edema of calves first noted on 07/19/2014.     Sciatica 05/30/2006   Qualifier: Diagnosis of  By: Sheffield Slider MD, Wayne  Involved left leg in 02/1999    Swallowing difficulty 07/06/2013   Urge incontinence 06/01/2008   Qualifier: Diagnosis of  By: Sheffield Slider MD, Deniece Portela      Family History  Problem Relation Age of Onset   Stroke Mother 38       cerebral hemorrhage   Hypertension Mother    Hypertension Father    Heart disease Father    Breast cancer Sister        1996   Breast cancer Sister 74   Breast cancer Maternal Aunt     Past Surgical History:  Procedure Laterality Date   CATARACT EXTRACTION  02/2011   left eye   TUBAL LIGATION  1973   VULVA /PERINEUM BIOPSY  2010   for hypopigmentation, non-specific result   Social History   Occupational History   Occupation: retired    Associate Professor: [employer]    Comment: career development  Tobacco Use   Smoking status: Former    Current packs/day: 0.00    Average packs/day: 0.5 packs/day for 15.0 years (7.5 ttl pk-yrs)    Types: Cigarettes    Start date: 04/03/1979    Quit date: 04/02/1994    Years since quitting: 29.1   Smokeless tobacco: Never  Vaping Use   Vaping status: Never Used  Substance and Sexual Activity   Alcohol use: No   Drug use: No   Sexual activity: Not Currently    Partners: Male    Birth control/protection: Surgical    Comment: BTSP, less than 5, IC after 14, no STD, no abnormal pap, no DES exposure

## 2023-06-19 ENCOUNTER — Ambulatory Visit (INDEPENDENT_AMBULATORY_CARE_PROVIDER_SITE_OTHER): Payer: 59 | Admitting: Internal Medicine

## 2023-06-19 ENCOUNTER — Encounter: Payer: Self-pay | Admitting: Internal Medicine

## 2023-06-19 DIAGNOSIS — E041 Nontoxic single thyroid nodule: Secondary | ICD-10-CM | POA: Diagnosis not present

## 2023-06-19 NOTE — Progress Notes (Signed)
 Patient ID: Laura Griffin, female   DOB: 05-20-1945, 78 y.o.   MRN: [id number]   HPI  Laura Griffin is a 78 y.o.-year-old pleasant female, initially referred by her PCP, Dr. Pollie Meyer, returning for follow-up for hypercalcemia/hyperparathyroidism and thyroid nodule.  Last visit 6 months ago.  Reviewed history: No fractures last visit.  She still has some problems with balance and knee pain - uses a cane. She had injections in B knees 10 days ago.  They helped a little.  She will start gel injections as soon as [facility] approves them.  Elevated calcium  - seen ~2005 when she was on calcium supplements.  She was advised to stop the supplements and calcium normalized  - however, in 2019, her calcium started to return elevated again.   - in 05/2019, we stopped multivitamins with calcium.  Subsequent PTH and calcium levels were normal. - in 01/2021, calcium was high again and it continued to fluctuate around the upper limit of the target range afterwards  I reviewed her pertinent labs: Lab Results  Component Value Date   CAION 5.7 (H) 12/13/2022   CAION 1.35 09/23/2022   CAION 5.5 04/17/2022   CAION 5.57 04/11/2021   CAION 5.5 03/22/2020   Lab Results  Component Value Date   PTH 33 12/13/2022   PTH Comment 12/13/2022   PTH 40 09/23/2019   PTH 29 06/10/2019   PTH 41 01/13/2019   PTH Comment 01/13/2019   PTH 44.3 04/25/2011   PTH 23.8 08/28/2007   CALCIUM 10.5 (H) 05/13/2023   CALCIUM 10.6 (H) 12/17/2022   CALCIUM 10.6 (H) 12/13/2022   CALCIUM 11.1 (H) 10/23/2022   CALCIUM 10.5 (H) 09/28/2022   CALCIUM 10.5 (H) 09/25/2022   CALCIUM 10.1 09/24/2022   CALCIUM 10.5 (H) 09/23/2022   CALCIUM 10.7 (H) 09/23/2022   CALCIUM 11.2 (H) 09/23/2022  12/15/2018: Corrected calcium 10.48 (ULN 10.3).  No history of osteoporosis.   DXA scan report from 2011: T-scores were normal.   DXA scan report from 05/03/2021: T-scores were normal  No history of kidney stones.  24-hour urine calcium  (02/08/2019) was normal, with a calcium of 91 mg/24 h, however, urine creatinine level was not checked at that time so it was unclear whether this was an appropriate collection or not.  We checked this again but unfortunately a BMP was not drawn so we could not calculate the fractional excretion of calcium: Component     Latest Ref Rng & Units 06/12/2019  Creatinine, 24H Ur     0.50 - 2.15 g/24 h 0.77  Calcium, 24H Urine     mg/24 h 66   However, we repeated the urine collection in 12/2022: Component     Latest Ref Rng 12/17/2022  Calcium, 24H Urine     mg/24 h 42   Creatinine, 24H Ur     0.50 - 2.15 g/24 h 0.96   FE Ca 0.00375 -not consistent with primary hyperparathyroidism.    After stopping her calcium supplement (multivitamin), her calcium, PTH, calcitriol levels were normal, as were her phosphorus and magnesium: Component     Latest Ref Rng & Units 06/10/2019  Vitamin D 1, 25 (OH) Total     18 - 72 pg/mL 30  Vitamin D3 1, 25 (OH)     pg/mL 30  Vitamin D2 1, 25 (OH)     pg/mL <8  PTH, Intact     15 - 65 pg/mL 29  Phosphorus     2.3 - 4.6  mg/dL 3.1  Magnesium     1.5 - 2.5 mg/dL 1.8   Component     Latest Ref Rng & Units 06/15/2019  Calcium     8.4 - 10.5 mg/dL 24.4   Component     Latest Ref Rng 12/17/2022  Vitamin D 1, 25 (OH) Total     18 - 72 pg/mL 30   Vitamin D3 1, 25 (OH)     pg/mL 30   Vitamin D2 1, 25 (OH)     pg/mL <8    + Mild CKD.  Latest BUN/Cr: Lab Results  Component Value Date   BUN 15 05/13/2023   BUN 14 12/17/2022   CREATININE 0.98 05/13/2023   CREATININE 0.91 12/17/2022   Previously on HCTZ, stopped in 08-12/2018.  No history of vitamin D deficiency: Lab Results  Component Value Date   VD25OH 41.25 12/13/2022   VD25OH 35.33 04/17/2022   VD25OH 37.56 04/11/2021   VD25OH 37.4 01/28/2020   VD25OH 30.8 09/23/2019   VD25OH 31.79 05/26/2019   She was previously on calcium (multivitamins - 300 mg) + 1000 units vitamin D daily.  We stopped  her calcium and I advised her to increase her vitamin D daily dose to 2000 units daily.  She is now on this dose.  She denies a FH of hypercalcemia, pituitary tumors, thyroid cancer, but sister fell and fractured wrist - likely osteoporosis.   Thyroid nodule: -Subcentimeter -Without worrisome features  Reviewed her thyroid ultrasound report from 2017: Right thyroid lobe : 2.6 cm x 0.8 cm x 1.7 cm. Heterogeneous appearance of the right thyroid tissue without significantly increased flow.   Left thyroid lobe : 3.6 cm x 1.2 cm x 1.3 cm. Heterogeneous appearance of left-sided thyroid tissue without significantly increased flow. Nodule at the inferior left thyroid measures 8 mm x 6 mm x 7 mm with no internal calcifications.  Isthmus Thickness: 5 mm.  No nodules visualized.  Lymphadenopathy: None visualized.   IMPRESSION: Heterogeneous thyroid with single left-sided nodule.   Findings do not meet current SRU consensus criteria for biopsy. Follow-up by clinical exam is recommended. In a patient of this age with no risk factors for thyroid carcinoma, lengthening the surveillance interval beyond 12 months is a reasonable strategy, as thyroid cancers <2cm are known to be indolent with nearly 100% 10-year survival. If the patient has known risk factors for thyroid carcinoma, a follow-up ultrasound in 12 months is recommended.  Pt denies: - feeling nodules in neck - hoarseness - dysphagia (only occasionally, with pills - fish oil) - choking  No family history of thyroid cancer.  No history of radiation to head or neck.  Latest TSH: Lab Results  Component Value Date   TSH 1.920 05/13/2023   TSH 1.46 03/22/2020   TSH 1.87 05/26/2019   TSH 1.638 05/05/2015   TSH 0.807 07/06/2013   TSH 1.196 10/03/2010   TSH 1.116 11/11/2007   She has chronic sciatica, improved. She also has OA in R knee - sees Dr. Dorene Grebe at [place].  She has gel injections, but no steroid injections. Pt.  also has a history of HTN, GERD (not on PPIs). Her mother died of cerebral hemorrhage in her late 76s as a complication of uncontrolled hypertension. In summer 2024, she was found down by her husband. She thinks she has a "ministroke" (she lost conscious for 1 day, reportedly). She was taken to the emergency room and admitted with altered mental status and hypertensive emergency (systolic blood pressure  190) on 09/23/2022.  She was admitted to ICU.  At discharge, losartan was increased to 100 mg daily and Amlodipine continued. She stopped Metoprolol.  ROS: + see HPI  I reviewed pt's medications, allergies, PMH, social hx, family hx, and changes were documented in the history of present illness. Otherwise, unchanged from my initial visit note.  Past Medical History:  Diagnosis Date   Allergic rhinitis 07/19/2014   Arthritis of knee, right 03/11/2006   Bursitis of shoulder, right 09/19/1999   Cervical radiculopathy at C6 05/24/2011   Beginning Nov 2012. Multi-level foraminal narrowing in C-spine plain films    Diverticulosis    DIVERTICULOSIS OF COLON 05/30/2006   Colonoscopy 06/2014 - multiple diverticula, no other abnormalities, no further colonoscopies recommended given patient's age of 47     GERD (gastroesophageal reflux disease) 07/19/2014   Diagnosed clinically by [facility] in January of 2016.     Glaucoma    Hearing decreased 08/07/2011   With whooshing tinnitus Mild bilateral loss on 08/2012 audiology who will recheck her hearing in one year     History of herniated intervertebral disc    Hypertension    HYPERTENSION, BENIGN SYSTEMIC 05/30/2006        Left sided sciatica 8/95,11/00   CT confirmed    LOW BACK PAIN SYNDROME 05/10/2009   Qualifier: Diagnosis of  By: Lorenda Hatchet CMA,, [place]  Mild C 3-4, 4-5 lumbar spinal stenosis MRI 08/2011, Diffuse lower thoracic and lumbar DDD    Mitral valve prolapse    per patient heart murmur   Orthostatic hypotension 06/12/2013   Osteoarthritis     Osteoarthritis of right knee 12/26/2012   Confirmed on xray 07/2014    OSTEOARTHRITIS, MULTI SITES 05/30/2006   She is stoic about pain and wants to avoid surgeries Past xrays have documented DJD in right knee and lumbar spine     Overweight (BMI 25.0-29.9) 05/30/2006   Pitting edema 07/19/2014   Bilateral 1+ pitting edema of calves first noted on 07/19/2014.     Sciatica 05/30/2006   Qualifier: Diagnosis of  By: Sheffield Slider MD, Wayne  Involved left leg in 02/1999    Swallowing difficulty 07/06/2013   Urge incontinence 06/01/2008   Qualifier: Diagnosis of  By: Sheffield Slider MD, Deniece Portela     Past Surgical History:  Procedure Laterality Date   CATARACT EXTRACTION  02/2011   left eye   TUBAL LIGATION  1973   VULVA /PERINEUM BIOPSY  2010   for hypopigmentation, non-specific result   Social History   Socioeconomic History   Marital status: Married    Spouse name: Fayrene Fearing   Number of children: 1   Years of education: 18   Highest education level: Master's degree (e.g., MA, MS, MEng, MEd, MSW, MBA)  Occupational History   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  Tobacco Use   Smoking status: Former    Current packs/day: 0.00    Average packs/day: 0.5 packs/day for 15.0 years (7.5 ttl pk-yrs)    Types: Cigarettes    Start date: 04/03/1979    Quit date: 04/02/1994    Years since quitting: 29.2   Smokeless tobacco: Never  Vaping Use   Vaping status: Never Used  Substance and Sexual Activity   Alcohol use: No   Drug use: No   Sexual activity: Not Currently    Partners: Male    Birth control/protection: Surgical    Comment: BTSP, less than 5, IC after 16, no STD,  no abnormal pap, no DES exposure  Other Topics Concern   Not on file  Social History Narrative   Patient lives with husband in [place].    Patient has one son and two grandchildren who live nearby.   Patient enjoys seeing her grandchildren grow up and watching their sporting events.    Patient enjoys  reading and her book club, crossword puzzles, and shopping.   Husband in remission for multiple myeloma and doing well.          Social Drivers of Corporate investment banker Strain: Low Risk  (09/26/2022)   Overall Financial Resource Strain (CARDIA)    Difficulty of Paying Living Expenses: Not hard at all  Food Insecurity: No Food Insecurity (09/26/2022)   Hunger Vital Sign    Worried About Running Out of Food in the Last Year: Never true    Ran Out of Food in the Last Year: Never true  Transportation Needs: No Transportation Needs (09/26/2022)   PRAPARE - Administrator, Civil Service (Medical): No    Lack of Transportation (Non-Medical): No  Physical Activity: Inactive (07/30/2022)   Exercise Vital Sign    Days of Exercise per Week: 0 days    Minutes of Exercise per Session: 0 min  Stress: No Stress Concern Present (07/30/2022)   [employer] of Occupational Health - Occupational Stress Questionnaire    Feeling of Stress : Not at all  Social Connections: Moderately Integrated (07/30/2022)   Social Connection and Isolation Panel [NHANES]    Frequency of Communication with Friends and Family: Three times a week    Frequency of Social Gatherings with Friends and Family: Twice a week    Attends Religious Services: Never    Database administrator or Organizations: No    Attends Engineer, structural: 1 to 4 times per year    Marital Status: Married  Catering manager Violence: Not At Risk (07/30/2022)   Humiliation, Afraid, Rape, and Kick questionnaire    Fear of Current or Ex-Partner: No    Emotionally Abused: No    Physically Abused: No    Sexually Abused: No   Current Outpatient Medications on File Prior to Visit  Medication Sig Dispense Refill   acetaminophen (TYLENOL) 650 MG CR tablet Take 1,300 mg by mouth 3 (three) times daily.     amLODipine (NORVASC) 10 MG tablet TAKE 1 TABLET(10 MG) BY MOUTH AT BEDTIME 90 tablet 1   aspirin EC 81 MG tablet Take 81  mg by mouth daily.     betamethasone valerate ointment (VALISONE) 0.1 % Place on the vulva in a thin layer twice daily for for 2 weeks for a flare and then twice a week at bedtime for maintenance dosing as needed. 30 g 0   brimonidine-timolol (COMBIGAN) 0.2-0.5 % ophthalmic solution Place 2 drops into both eyes every 12 (twelve) hours.     Cholecalciferol 25 MCG (1000 UT) capsule Take 2,000 Units by mouth daily.     Diclofenac Sodium 3 % GEL Apply four times a day on the painful area at the base of your neck 100 g 3   gabapentin (NEURONTIN) 400 MG capsule Take 1 capsule (400 mg total) by mouth 3 (three) times daily. 90 capsule 0   latanoprost (XALATAN) 0.005 % ophthalmic solution Place 1 drop into both eyes at bedtime.     losartan (COZAAR) 100 MG tablet Take 1 tablet (100 mg total) by mouth daily. 90 tablet 2   Omega-3  Fatty Acids (FISH OIL PO) Take 1 capsule by mouth daily.     No current facility-administered medications on file prior to visit.   Allergies  Allergen Reactions   Sulfamethoxazole-Trimethoprim     REACTION: rash: nausea, swelling   Hctz [Hydrochlorothiazide] Other (See Comments)    Elevated calcium   Family History  Problem Relation Age of Onset   Stroke Mother 77       cerebral hemorrhage   Hypertension Mother    Hypertension Father    Heart disease Father    Breast cancer Sister        6   Breast cancer Sister 55   Breast cancer Maternal Aunt     PE: BP 118/70   Pulse 60   Ht 5' 5.5" (1.664 m)   Wt 176 lb 6.4 oz (80 kg)   LMP 04/02/1996   SpO2 98%   BMI 28.91 kg/m  Wt Readings from Last 3 Encounters:  06/19/23 176 lb 6.4 oz (80 kg)  05/13/23 176 lb (79.8 kg)  02/13/23 178 lb (80.7 kg)   Constitutional: overweight, in NAD Eyes:  EOMI, no exophthalmos ENT: no neck masses, no cervical lymphadenopathy Cardiovascular: RRR, No MRG Respiratory: CTA B Musculoskeletal: no deformities Skin:no rashes Neurological: no tremor with outstretched  hands  Assessment: 1. Hypercalcemia/hyperparathyroidism  2.  Thyroid nodule  Plan: Patient with history of elevated total calcium, with the highest being 11 4.  An intact PTH was not suppressed, at 41, for a calcium level of 10.6.   -She does not have a documented history of vitamin D deficiency.  She continues 2000 units vitamin D daily.  Latest level was normal: Lab Results  Component Value Date   VD25OH 41.25 12/13/2022  -At last visit we checked her calcitriol level and this was normal, at 30.  At that time, she had a slightly elevated ionized calcium, at 5.7 (previous ionized calcium levels were repeatedly normal in 2021, 2023, 2024) ,and a nonsuppressed PTH, at 33.  We checked a 24-hour urine collection and her fractional excretion of calcium was low, at 0.00375, which was not consistent with primary hyperparathyroidism.  Of note, magnesium level was previously checked and this was not elevated to suggest familial hypocalciuric hypercalcemia.  Therefore, due to age, and especially as her hypercalcemia was mild, we decided to continue to follow her conservatively. -Of note, she has a mildly decreased kidney function, with a GFR of 59.98 at last visit. -We discussed again about possible consequences of hyperparathyroidism: Osteoporosis, nephrolithiasis, however, new studies show that mild hyperparathyroidism may be stable for many years and without significant complications.  This being said, if calcium shows a progressive increase, she may benefit from a referral to surgery. -Reviewed criteria for surgery Increased calcium by 1 mg/dL above the upper limit of normal -previously, not recently Kidney ds.  Osteoporosis (or vertebral fracture) Age <85 years old Newer criteria (2013): High UCa >400 mg/d and increased stone risk by biochemical stone risk analysis Presence of nephrolithiasis or nephrocalcinosis Pt's preference!  -At today's visit we will check a vitamin D, ionized calcium, and  PTH level -I will see her back in 1 year  2.  Thyroid nodule -Reviewed the report of her thyroid ultrasound from 2007: Small thyroid nodule, measuring 8 mm in the largest dimension, not worrisome.  No imaging follow-up is needed for this especially as she does not have risk factors for thyroid cancer-family history of the disease or exposure to radiation to head or neck -No  masses felt on palpation of her neck today and she has no neck compression symptoms -Latest TSH was normal last month: Lab Results  Component Value Date   TSH 1.920 05/13/2023  -Will continue to follow her clinically for this  Orders Placed This Encounter  Procedures   Parathyroid hormone, intact (no Ca)   Calcium, ionized   VITAMIN D 25 Hydroxy (Vit-D Deficiency, Fractures)   Carlus Pavlov, MD PhD [facility]

## 2023-06-19 NOTE — Patient Instructions (Addendum)
Please stop at the lab.  Continue 2000 units vit D daily.  Please come back for a follow-up appointment in 1 year. 

## 2023-06-20 LAB — PARATHYROID HORMONE, INTACT (NO CA): PTH: 74 pg/mL (ref 16–77)

## 2023-06-20 LAB — VITAMIN D 25 HYDROXY (VIT D DEFICIENCY, FRACTURES): Vit D, 25-Hydroxy: 35 ng/mL (ref 30–100)

## 2023-06-20 LAB — CALCIUM, IONIZED: Calcium, Ion: 5.5 mg/dL (ref 4.7–5.5)

## 2023-06-27 NOTE — Telephone Encounter (Signed)
 VOB submitted for Monovisc, bilateral knee

## 2023-07-07 ENCOUNTER — Other Ambulatory Visit: Payer: Self-pay | Admitting: Family Medicine

## 2023-07-10 ENCOUNTER — Other Ambulatory Visit: Payer: Self-pay

## 2023-07-10 ENCOUNTER — Telehealth: Payer: Self-pay | Admitting: Orthopedic Surgery

## 2023-07-10 ENCOUNTER — Ambulatory Visit (INDEPENDENT_AMBULATORY_CARE_PROVIDER_SITE_OTHER): Payer: 59 | Admitting: Orthopedic Surgery

## 2023-07-10 DIAGNOSIS — M4712 Other spondylosis with myelopathy, cervical region: Secondary | ICD-10-CM | POA: Diagnosis not present

## 2023-07-10 DIAGNOSIS — M170 Bilateral primary osteoarthritis of knee: Secondary | ICD-10-CM

## 2023-07-10 NOTE — Telephone Encounter (Signed)
 Called and scheduled pt

## 2023-07-10 NOTE — Progress Notes (Signed)
 Orthopedic Spine Surgery Office Note   Assessment: Patient is a 78 y.o. female with signs and symptoms consistent with cervical spondylotic myelopathy.  Has central stenosis at C3/4 and C4/5     Plan: - Patient still wants to monitor her myelopathy.  She has not noticed any progression.  She is not interested in surgery.  Her exam is stable - She should continue to use the cane to help with her balance and prevent falls -Will plan to see her back in 3 months, x-rays at next visit: None   Patient expressed understanding of the plan and all questions were answered to the patient's satisfaction.    ___________________________________________________________________________     History:   Patient is a 78 y.o. female who returns for routine checkup on her neck.  She is not having any neck pain or pain radiating to either upper extremity.  She has noticed persistent issues with balance and has been using a cane to ambulate.  She also describes trouble with fine motor skills in the hands.  She has not noticed any progression in her unsteadiness or difficulty with fine motor skills in the hands.  Denies paresthesia numbness.  Has a long history of urinary incontinence.  No recent changes.  No bowel incontinence.  No saddle anesthesia.   Treatments tried: cane use, Tylenol, salonpas, heating pads     Physical Exam:   General: no acute distress, appears stated age Neurologic: alert, answering questions appropriately, following commands Respiratory: unlabored breathing on room air, symmetric chest rise Psychiatric: appropriate affect, normal cadence to speech     MSK (spine):   -Strength exam                                                   Left                  Right Grip strength                5/5                  5/5 Interosseus                  5/5                  5/5 Wrist extension            5/5                  5/5 Wrist flexion                 5/5                  5/5 Elbow  flexion                5/5                  5/5 Deltoid                          5/5                  5/5   EHL  4/5                  4/5 TA                                 5/5                  5/5 GSC                             5/5                  5/5 Knee extension            5/5                  5/5 Hip flexion                    4/5                  4/5   -Sensory exam                           Sensation intact to light touch in L3-S1 nerve distributions of bilateral lower extremities             Sensation intact to light touch in C5-T1 nerve distributions of bilateral upper extremities   -Brachioradialis DTR: 2/4 on the left, 2/4 on the right -Biceps DTR: 2/4 on the left, 2/4 on the right -Achilles DTR: 2/4 on the left, 2/4 on the right -Patellar tendon DTR: 2/4 on the left, 2/4 on the right   -Spurling: Negative bilaterally -Hoffman sign: Negative bilaterally -Clonus: No beats bilaterally -Interosseous wasting: None seen -Grip and release test: Positive -Romberg: Positive -Gait: Wide-based and uses cane -Imbalance with tandem gait: Yes   Imaging: XRs of the cervical spine from 04/09/2022 and 05/30/2022 were previously independently reviewed and interpreted, showing multilevel spondylosis with disc height loss and anterior osteophyte formation.  Spondylolisthesis at C3/4 that shifts about 1 mm between flexion/extension views.  No fracture or dislocation seen.   MRI of the cervical spine from 05/02/2022 was previously independently reviewed and interpreted, showing central stenosis at C3 3/4 and C4/5.  Foraminal stenosis on the right at C2/3, bilateral foraminal stenosis at C3/4, bilateral foraminal stenosis at C4/5, bilateral foraminal stenosis at C5/6, and bilateral foraminal stenosis at C6/7.  No T2 cord signal change seen.     Patient name: Laura Griffin Patient MRN: [id number] Date of visit: 07/10/23

## 2023-07-10 NOTE — Telephone Encounter (Signed)
 Approved for Monovisc-bilateral knee Buy and bill Covered at 100% from medicare and seconday No prior auth required

## 2023-07-10 NOTE — Telephone Encounter (Signed)
 Thank you :)

## 2023-07-10 NOTE — Telephone Encounter (Signed)
 Pt checking the status of the gel injection auth.

## 2023-07-12 ENCOUNTER — Encounter: Payer: Self-pay | Admitting: Family Medicine

## 2023-07-22 ENCOUNTER — Ambulatory Visit (INDEPENDENT_AMBULATORY_CARE_PROVIDER_SITE_OTHER): Admitting: Orthopedic Surgery

## 2023-07-22 DIAGNOSIS — M170 Bilateral primary osteoarthritis of knee: Secondary | ICD-10-CM

## 2023-07-24 ENCOUNTER — Other Ambulatory Visit: Payer: Self-pay | Admitting: Family Medicine

## 2023-07-25 ENCOUNTER — Encounter: Payer: Self-pay | Admitting: Orthopedic Surgery

## 2023-07-25 NOTE — Progress Notes (Signed)
 Office Visit Note   Patient: Laura Griffin           Date of Birth: May 22, 1945           MRN: [id number] Visit Date: 07/22/2023 Requested by: Candee Cha, MD [address] [place],  [place] [address] PCP: Candee Cha, MD  Subjective: Chief Complaint  Patient presents with   Right Knee - Pain   Left Knee - Pain    HPI: Laura Griffin is a 78 y.o. female who presents to the office reporting bilateral knee pain.  She is here for bilateral Monovisc injections.                ROS: All systems reviewed are negative as they relate to the chief complaint within the history of present illness.  Patient denies fevers or chills.  Assessment & Plan: Visit Diagnoses:  1. Bilateral primary osteoarthritis of knee     Plan: Plan is bilateral Monovisc injections.  Lot #[id number] follow-up as needed  Follow-Up Instructions: No follow-ups on file.   Orders:  No orders of the defined types were placed in this encounter.  No orders of the defined types were placed in this encounter.     Procedures: Large Joint Inj: bilateral knee on 07/27/2023 7:36 AM Indications: pain, joint swelling and diagnostic evaluation Details: 18 G 1.5 in needle, superolateral approach  Arthrogram: No  Medications (Right): 88 mg Hyaluronan 88 MG/4ML Medications (Left): 88 mg Hyaluronan 88 MG/4ML Outcome: tolerated well, no immediate complications Procedure, treatment alternatives, risks and benefits explained, specific risks discussed. Consent was given by the patient. Immediately prior to procedure a time out was called to verify the correct patient, procedure, equipment, support staff and site/side marked as required. Patient was prepped and draped in the usual sterile fashion.       Clinical Data: No additional findings.  Objective: Vital Signs: LMP 04/02/1996   Physical Exam:  Constitutional: Patient appears well-developed HEENT:  Head: Normocephalic Eyes:EOM are  normal Neck: Normal range of motion Cardiovascular: Normal rate Pulmonary/chest: Effort normal Neurologic: Patient is alert Skin: Skin is warm Psychiatric: Patient has normal mood and affect  Ortho Exam: Ortho exam unchanged from last visit  Specialty Comments:  No specialty comments available.  Imaging: No results found.   PMFS History: Patient Active Problem List   Diagnosis Date Noted   AMS (altered mental status) 09/23/2022   Stress incontinence 07/11/2021   Skin lesion 12/08/2020   Right-sided back pain 11/08/2020   Ganglion cyst of dorsum of right wrist 12/15/2018   Lichen sclerosus of female genitalia 09/25/2018    Class: Chronic   Hyperlipidemia 03/17/2018   Lichen planus 03/17/2018   Numbness and tingling of right leg 10/19/2016   Shoulder pain 03/01/2016   Headache 03/01/2016   Asymmetric SNHL (sensorineural hearing loss) 01/10/2016   Tinnitus of both ears 01/10/2016   Hearing loss 10/07/2015   Thyroid  nodule 05/24/2015   Globus sensation 05/05/2015   Neck pain 01/07/2015   Allergic rhinitis 07/19/2014   GERD (gastroesophageal reflux disease) 07/19/2014   Pitting edema 07/19/2014   Swallowing difficulty 07/06/2013   Orthostatic hypotension 06/12/2013   Osteoarthritis of right knee 12/26/2012   GLAUCOMA 11/04/2009   DDD (degenerative disc disease), lumbar 05/10/2009   URGE INCONTINENCE 06/01/2008   Hypercalcemia 08/12/2007   Overweight (BMI 25.0-29.9) 05/30/2006   HYPERTENSION, BENIGN SYSTEMIC 05/30/2006   Osteoarthritis 05/30/2006   Past Medical History:  Diagnosis Date   Allergic rhinitis 07/19/2014  Arthritis of knee, right 03/11/2006   Bursitis of shoulder, right 09/19/1999   Cervical radiculopathy at C6 05/24/2011   Beginning Nov 2012. Multi-level foraminal narrowing in C-spine plain films    Diverticulosis    DIVERTICULOSIS OF COLON 05/30/2006   Colonoscopy 06/2014 - multiple diverticula, no other abnormalities, no further colonoscopies  recommended given patient's age of 21     GERD (gastroesophageal reflux disease) 07/19/2014   Diagnosed clinically by [facility] in January of 2016.     Glaucoma    Hearing decreased 08/07/2011   With whooshing tinnitus Mild bilateral loss on 08/2012 audiology who will recheck her hearing in one year     History of herniated intervertebral disc    Hypertension    HYPERTENSION, BENIGN SYSTEMIC 05/30/2006        Left sided sciatica 8/95,11/00   CT confirmed    LOW BACK PAIN SYNDROME 05/10/2009   Qualifier: Diagnosis of  By: Haig Levan CMA,, Thekla  Mild C 3-4, 4-5 lumbar spinal stenosis MRI 08/2011, Diffuse lower thoracic and lumbar DDD    Mitral valve prolapse    per patient heart murmur   Orthostatic hypotension 06/12/2013   Osteoarthritis    Osteoarthritis of right knee 12/26/2012   Confirmed on xray 07/2014    OSTEOARTHRITIS, MULTI SITES 05/30/2006   She is stoic about pain and wants to avoid surgeries Past xrays have documented DJD in right knee and lumbar spine     Overweight (BMI 25.0-29.9) 05/30/2006   Pitting edema 07/19/2014   Bilateral 1+ pitting edema of calves first noted on 07/19/2014.     Sciatica 05/30/2006   Qualifier: Diagnosis of  By: Tarry Farmer MD, Wayne  Involved left leg in 02/1999    Swallowing difficulty 07/06/2013   Urge incontinence 06/01/2008   Qualifier: Diagnosis of  By: Tarry Farmer MD, Forestine Igo      Family History  Problem Relation Age of Onset   Stroke Mother 68       cerebral hemorrhage   Hypertension Mother    Hypertension Father    Heart disease Father    Breast cancer Sister        1996   Breast cancer Sister 80   Breast cancer Maternal Aunt     Past Surgical History:  Procedure Laterality Date   CATARACT EXTRACTION  02/2011   left eye   TUBAL LIGATION  1973   VULVA /PERINEUM BIOPSY  2010   for hypopigmentation, non-specific result   Social History   Occupational History   Occupation: retired    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

## 2023-07-27 DIAGNOSIS — M170 Bilateral primary osteoarthritis of knee: Secondary | ICD-10-CM | POA: Diagnosis not present

## 2023-07-27 MED ORDER — HYALURONAN 88 MG/4ML IX SOSY
88.0000 mg | PREFILLED_SYRINGE | INTRA_ARTICULAR | Status: AC | PRN
  Administered 2023-07-27: 88 mg via INTRA_ARTICULAR

## 2023-08-05 ENCOUNTER — Ambulatory Visit

## 2023-08-05 VITALS — Ht 65.5 in | Wt 174.0 lb

## 2023-08-05 DIAGNOSIS — Z0000 Encounter for general adult medical examination without abnormal findings: Secondary | ICD-10-CM | POA: Diagnosis not present

## 2023-08-05 NOTE — Progress Notes (Signed)
 Because this visit was a virtual/telehealth visit,  certain criteria was not obtained, such a blood pressure, CBG if applicable, and timed get up and go. Any medications not marked as "taking" were not mentioned during the medication reconciliation part of the visit. Any vitals not documented were not able to be obtained due to this being a telehealth visit or patient was unable to self-report a recent blood pressure reading due to a lack of equipment at home via telehealth. Vitals that have been documented are verbally provided by the patient.   Subjective:   Laura Griffin is a 78 y.o. who presents for a Medicare Wellness preventive visit.  Visit Complete: Virtual I connected with  Laura Griffin on 08/05/23 by a audio enabled telemedicine application and verified that I am speaking with the correct person using two identifiers.  Patient Location: Home  Provider Location: Office/Clinic  I discussed the limitations of evaluation and management by telemedicine. The patient expressed understanding and agreed to proceed.  Vital Signs: Because this visit was a virtual/telehealth visit, some criteria may be missing or patient reported. Any vitals not documented were not able to be obtained and vitals that have been documented are patient reported.  VideoDeclined- This patient declined Librarian, academic. Therefore the visit was completed with audio only.  Persons Participating in Visit: Patient.  AWV Questionnaire: No: Patient Medicare AWV questionnaire was not completed prior to this visit.  Cardiac Risk Factors include: advanced age (>58men, >32 women);dyslipidemia;hypertension;sedentary lifestyle;family history of premature cardiovascular disease     Objective:    Today's Vitals   08/05/23 1153  Weight: 174 lb (78.9 kg)  Height: 5' 5.5" (1.664 m)  PainSc: 7   PainLoc: Leg   Body mass index is 28.51 kg/m.     08/05/2023   11:55 AM 05/13/2023     9:42 AM 09/23/2022   12:46 AM 08/08/2022    4:09 PM 07/30/2022   10:21 AM 01/04/2022    2:05 PM 11/06/2021   12:25 PM  Advanced Directives  Does Patient Have a Medical Advance Directive? Yes No No No No No No  Type of Estate agent of [place];Living will        Copy of Healthcare Power of Attorney in Chart? No - copy requested        Would patient like information on creating a medical advance directive?  No - Patient declined No - Patient declined  No - Patient declined No - Patient declined No - Patient declined    Current Medications (verified) Outpatient Encounter Medications as of 08/05/2023  Medication Sig   acetaminophen  (TYLENOL ) 650 MG CR tablet Take 1,300 mg by mouth 3 (three) times daily.   amLODipine  (NORVASC ) 10 MG tablet TAKE 1 TABLET(10 MG) BY MOUTH AT BEDTIME   aspirin  EC 81 MG tablet Take 81 mg by mouth daily.   betamethasone  valerate ointment (VALISONE ) 0.1 % Place on the vulva in a thin layer twice daily for for 2 weeks for a flare and then twice a week at bedtime for maintenance dosing as needed.   brimonidine -timolol  (COMBIGAN ) 0.2-0.5 % ophthalmic solution Place 2 drops into both eyes every 12 (twelve) hours.   Cholecalciferol  25 MCG (1000 UT) capsule Take 2,000 Units by mouth daily.   Diclofenac  Sodium 3 % GEL Apply four times a day on the painful area at the base of your neck   gabapentin  (NEURONTIN ) 400 MG capsule Take 1 capsule (400 mg total) by mouth  3 (three) times daily.   latanoprost  (XALATAN ) 0.005 % ophthalmic solution Place 1 drop into both eyes at bedtime.   losartan  (COZAAR ) 100 MG tablet TAKE 1 TABLET(100 MG) BY MOUTH DAILY   Omega-3 Fatty Acids (FISH OIL PO) Take 1 capsule by mouth daily.   No facility-administered encounter medications on file as of 08/05/2023.    Allergies (verified) Sulfamethoxazole-trimethoprim and Hctz [hydrochlorothiazide ]   History: Past Medical History:  Diagnosis Date   Allergic rhinitis 07/19/2014    Arthritis of knee, right 03/11/2006   Bursitis of shoulder, right 09/19/1999   Cervical radiculopathy at C6 05/24/2011   Beginning Nov 2012. Multi-level foraminal narrowing in C-spine plain films    Diverticulosis    DIVERTICULOSIS OF COLON 05/30/2006   Colonoscopy 06/2014 - multiple diverticula, no other abnormalities, no further colonoscopies recommended given patient's age of 32     GERD (gastroesophageal reflux disease) 07/19/2014   Diagnosed clinically by [facility] in January of 2016.     Glaucoma    Hearing decreased 08/07/2011   With whooshing tinnitus Mild bilateral loss on 08/2012 audiology who will recheck her hearing in one year     History of herniated intervertebral disc    Hypertension    HYPERTENSION, BENIGN SYSTEMIC 05/30/2006        Left sided sciatica 8/95,11/00   CT confirmed    LOW BACK PAIN SYNDROME 05/10/2009   Qualifier: Diagnosis of  By: Haig Levan CMA,, Thekla  Mild C 3-4, 4-5 lumbar spinal stenosis MRI 08/2011, Diffuse lower thoracic and lumbar DDD    Mitral valve prolapse    per patient heart murmur   Orthostatic hypotension 06/12/2013   Osteoarthritis    Osteoarthritis of right knee 12/26/2012   Confirmed on xray 07/2014    OSTEOARTHRITIS, MULTI SITES 05/30/2006   She is stoic about pain and wants to avoid surgeries Past xrays have documented DJD in right knee and lumbar spine     Overweight (BMI 25.0-29.9) 05/30/2006   Pitting edema 07/19/2014   Bilateral 1+ pitting edema of calves first noted on 07/19/2014.     Sciatica 05/30/2006   Qualifier: Diagnosis of  By: Tarry Farmer MD, Wayne  Involved left leg in 02/1999    Swallowing difficulty 07/06/2013   Urge incontinence 06/01/2008   Qualifier: Diagnosis of  By: Tarry Farmer MD, Forestine Igo     Past Surgical History:  Procedure Laterality Date   CATARACT EXTRACTION  02/2011   left eye   TUBAL LIGATION  1973   VULVA /PERINEUM BIOPSY  2010   for hypopigmentation, non-specific result   Family History  Problem Relation Age of Onset    Stroke Mother 51       cerebral hemorrhage   Hypertension Mother    Hypertension Father    Heart disease Father    Breast cancer Sister        1996   Breast cancer Sister 20   Breast cancer Maternal Aunt    Social History   Socioeconomic History   Marital status: Married    Spouse name: Royston Cornea   Number of children: 1   Years of education: 18   Highest education level: Master's degree (e.g., MA, MS, MEng, MEd, MSW, MBA)  Occupational History   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  Tobacco Use   Smoking status: Former    Current packs/day: 0.00    Average packs/day: 0.5 packs/day for 15.0 years (7.5 ttl pk-yrs)    Types: Cigarettes  Start date: 04/03/1979    Quit date: 04/02/1994    Years since quitting: 29.3   Smokeless tobacco: Never  Vaping Use   Vaping status: Never Used  Substance and Sexual Activity   Alcohol use: No   Drug use: No   Sexual activity: Not Currently    Partners: Male    Birth control/protection: Surgical    Comment: BTSP, less than 5, IC after 16, no STD, no abnormal pap, no DES exposure  Other Topics Concern   Not on file  Social History Narrative   Patient lives with husband in [place].    Patient has one son and two grandchildren who live nearby.   Patient enjoys seeing her grandchildren grow up and watching their sporting events.    Patient enjoys reading and her book club, crossword puzzles, and shopping.   Husband in remission for multiple myeloma and doing well.          Social Drivers of Corporate investment banker Strain: Low Risk  (08/05/2023)   Overall Financial Resource Strain (CARDIA)    Difficulty of Paying Living Expenses: Not hard at all  Food Insecurity: No Food Insecurity (08/05/2023)   Hunger Vital Sign    Worried About Running Out of Food in the Last Year: Never true    Ran Out of Food in the Last Year: Never true  Transportation Needs: No Transportation Needs (08/05/2023)    PRAPARE - Administrator, Civil Service (Medical): No    Lack of Transportation (Non-Medical): No  Physical Activity: Inactive (08/05/2023)   Exercise Vital Sign    Days of Exercise per Week: 0 days    Minutes of Exercise per Session: 0 min  Stress: No Stress Concern Present (08/05/2023)   [employer] of Occupational Health - Occupational Stress Questionnaire    Feeling of Stress : Not at all  Social Connections: Moderately Integrated (08/05/2023)   Social Connection and Isolation Panel [NHANES]    Frequency of Communication with Friends and Family: Three times a week    Frequency of Social Gatherings with Friends and Family: Twice a week    Attends Religious Services: Never    Database administrator or Organizations: No    Attends Engineer, structural: 1 to 4 times per year    Marital Status: Married    Tobacco Counseling Counseling given: Not Answered    Clinical Intake:  Pre-visit preparation completed: Yes  Pain : 0-10 Pain Score: 7  Pain Type: Chronic pain Pain Location: Leg Pain Orientation: Right, Left Pain Descriptors / Indicators: Other (Comment), Aching, Discomfort, Constant, Pressure (Patient has osteoarthritis in both knees.  Patient goes to [facility].) Pain Onset: More than a month ago Pain Frequency: Constant     BMI - recorded: 28.51 Nutritional Status: BMI 25 -29 Overweight Nutritional Risks: None Diabetes: No  No results found for: "HGBA1C"   How often do you need to have someone help you when you read instructions, pamphlets, or other written materials from your doctor or pharmacy?: 1 - Never What is the last grade level you completed in school?: HSG  Interpreter Needed?: No  Information entered by :: Druscilla Gerhard, LPN.   Activities of Daily Living     08/05/2023   12:00 PM 09/25/2022    4:51 PM  In your present state of health, do you have any difficulty performing the following activities:  Hearing? 0   Vision? 0    Difficulty concentrating or making decisions? 0  Walking or climbing stairs? 0   Dressing or bathing? 0   Doing errands, shopping? 0 0  Preparing Food and eating ? N   Using the Toilet? N   In the past six months, have you accidently leaked urine? Y   Comment Uses Depends Underwear for protection   Do you have problems with loss of bowel control? N   Managing your Medications? N   Managing your Finances? N   Housekeeping or managing your Housekeeping? N     Patient Care Team: Candee Cha, MD as PCP - General (Family Medicine) Dale Dubonnet, OD (Optometry) Sharman Debar, DDS (Dentistry) Lanita Pitman, MD as Consulting Physician (Gastroenterology) Rozelle Corning Maricela Shoe, MD as Consulting Physician (Orthopedic Surgery) Greta Leatherwood, MD as Consulting Physician (Obstetrics and Gynecology) Emilie Harden, MD as Consulting Physician (Internal Medicine) Karmen Pa, MD as Consulting Physician (Ophthalmology)  Indicate any recent Medical Services you may have received from other than [facility] providers in the past year (date may be approximate).     Assessment:   This is a routine wellness examination for Laura Griffin.  Hearing/Vision screen Hearing Screening - Comments:: Patient has hearing difficulty and does not wear hearing aids. Vision Screening - Comments:: Patient does not wear any corrective lenses/contacts.   Eye exam done by: Dr. Luetta Sago    Goals Addressed             This Visit's Progress    My goal for 2025 is to be pain free, improve my balance and eat healthier.  I would like to try working with a nutritionist.         Depression Screen     08/05/2023   12:02 PM 05/13/2023    9:42 AM 11/13/2022   10:21 AM 10/23/2022   10:52 AM 09/28/2022   10:55 AM 07/30/2022   10:10 AM 01/04/2022    2:06 PM  PHQ 2/9 Scores  PHQ - 2 Score 0 0 0 0 0 0 0  PHQ- 9 Score 0 0 1 1 1  1     Fall Risk     08/05/2023   11:55 AM 05/13/2023    9:41 AM 11/13/2022    10:21 AM 10/23/2022   10:51 AM 09/28/2022   10:23 AM  Fall Risk   Falls in the past year? 0 0 0 0 0  Number falls in past yr: 0 0 0 0 0  Injury with Fall? 0 0 0 0 0  Risk for fall due to : No Fall Risks No Fall Risks  No Fall Risks No Fall Risks  Follow up Falls prevention discussed;Falls evaluation completed Falls evaluation completed  Falls evaluation completed Falls evaluation completed    MEDICARE RISK AT HOME:  Medicare Risk at Home Any stairs in or around the home?: No If so, are there any without handrails?: No Home free of loose throw rugs in walkways, pet beds, electrical cords, etc?: Yes Adequate lighting in your home to reduce risk of falls?: Yes Life alert?: No Use of a cane, walker or w/c?: Yes (CANE) Grab bars in the bathroom?: Yes (TUB) Shower chair or bench in shower?: Yes (NOT USING AT THIS TIME) Elevated toilet seat or a handicapped toilet?: Yes (NOT IN USE AT THIS TIME)  TIMED UP AND GO:  Was the test performed?  No  Cognitive Function: 6CIT completed    08/05/2023   12:13 PM 01/06/2015    4:47 PM 07/09/2013    3:00 PM 10/23/2010  11:00 AM  MMSE - Mini Mental State Exam  Not completed: Unable to complete --    Orientation to time   5 5  Orientation to Place   5 5  Registration   3 3  Attention/ Calculation   5 5  Recall   3 3  Language- name 2 objects   2 2  Language- repeat   1 1  Language- follow 3 step command   3 3  Language- read & follow direction   1 1  Write a sentence   1 1  Copy design   1 1  Total score   30 30        08/05/2023   11:57 AM 07/30/2022   10:15 AM 06/14/2021    2:35 PM 02/17/2020   11:33 AM  6CIT Screen  What Year? 0 points 0 points 0 points 0 points  What month? 0 points 0 points 0 points 0 points  What time? 0 points 0 points 0 points 0 points  Count back from 20 0 points 0 points 0 points 0 points  Months in reverse 0 points 0 points 0 points 0 points  Repeat phrase 0 points 0 points 0 points 0 points  Total Score 0  points 0 points 0 points 0 points    Immunizations Immunization History  Administered Date(s) Administered   Fluad Quad(high Dose 65+) 12/15/2018, 01/08/2020, 01/03/2021, 01/04/2022, 01/27/2023   Influenza Split 02/20/2011, 01/16/2012   Influenza Whole 01/17/2009, 01/10/2010   Influenza, High Dose Seasonal PF 01/01/2017, 01/17/2018   Influenza,inj,Quad PF,6+ Mos 12/26/2012, 12/21/2013, 01/06/2015, 01/05/2016   Influenza-Unspecified 12/25/2016   PFIZER(Purple Top)SARS-COV-2 Vaccination 04/22/2019, 05/13/2019, 01/28/2020, 09/07/2020   Pfizer Covid-19 Vaccine Bivalent Booster 58yrs & up 01/18/2021   Pfizer(Comirnaty)Fall Seasonal Vaccine 12 years and older 02/11/2023   Pneumococcal Conjugate-13 07/09/2013   Pneumococcal Polysaccharide-23 07/13/2008   Td 04/02/2004   Tdap 04/02/2006, 09/02/2017   Zoster Recombinant(Shingrix ) 06/03/2020, 08/09/2020   Zoster, Live 04/02/2012    Screening Tests Health Maintenance  Topic Date Due   COVID-19 Vaccine (7 - 2024-25 season) 08/11/2023   INFLUENZA VACCINE  11/01/2023   MAMMOGRAM  12/14/2023   Medicare Annual Wellness (AWV)  08/04/2024   DTaP/Tdap/Td (4 - Td or Tdap) 09/03/2027   Pneumonia Vaccine 66+ Years old  Completed   DEXA SCAN  Completed   Zoster Vaccines- Shingrix   Completed   HPV VACCINES  Aged Out   Meningococcal B Vaccine  Aged Out   Colonoscopy  Discontinued   Hepatitis C Screening  Discontinued    Health Maintenance  There are no preventive care reminders to display for this patient.  Health Maintenance Items Addressed: Yes Patient is current and up to date.  Additional Screening:  Vision Screening: Recommended annual ophthalmology exams for early detection of glaucoma and other disorders of the eye.  Dental Screening: Recommended annual dental exams for proper oral hygiene  Community Resource Referral / Chronic Care Management: CRR required this visit?  No   CCM required this visit?  No     Plan:     I  have personally reviewed and noted the following in the patient's chart:   Medical and social history Use of alcohol, tobacco or illicit drugs  Current medications and supplements including opioid prescriptions. Patient is not currently taking opioid prescriptions. Functional ability and status Nutritional status Physical activity Advanced directives List of other physicians Hospitalizations, surgeries, and ER visits in previous 12 months Vitals Screenings to include cognitive, depression, and falls  Referrals and appointments  In addition, I have reviewed and discussed with patient certain preventive protocols, quality metrics, and best practice recommendations. A written personalized care plan for preventive services as well as general preventive health recommendations were provided to patient.     Margette Sheldon, LPN   04/07/1094   After Visit Summary: (Declined) Due to this being a telephonic visit, with patients personalized plan was offered to patient but patient Declined AVS at this time   Notes: Please refer to Routing Comments.

## 2023-08-05 NOTE — Patient Instructions (Addendum)
 Laura Griffin , Thank you for taking time to come for your Medicare Wellness Visit. I appreciate your ongoing commitment to your health goals. Please review the following plan we discussed and let me know if I can assist you in the future.   Referrals/Orders/Follow-Ups/Clinician Recommendations: A message was sent to Dr. Dawn Eth to refer you to a nutritionist to help you choose healthier eating options.  Recommendations from Nurse Damyn Weitzel: Aim for 30 minutes of exercise or brisk walking 5 days per week.  Drink 6-8 glasses of water each day. Eat 5-6 servings of fresh vegetables and fruits per day. Continue to do brain exercises such as reading, puzzles, games on your phone to help keep the brain sharp and active.  This is a list of the screening recommended for you and due dates:  Health Maintenance  Topic Date Due   COVID-19 Vaccine (7 - 2024-25 season) 08/11/2023   Flu Shot  11/01/2023   Mammogram  12/14/2023   Medicare Annual Wellness Visit  08/04/2024   DTaP/Tdap/Td vaccine (4 - Td or Tdap) 09/03/2027   Pneumonia Vaccine  Completed   DEXA scan (bone density measurement)  Completed   Zoster (Shingles) Vaccine  Completed   HPV Vaccine  Aged Out   Meningitis B Vaccine  Aged Out   Colon Cancer Screening  Discontinued   Hepatitis C Screening  Discontinued    Advanced directives: (Declined) Advance directive discussed with you today. Even though you declined this today, please call our office should you change your mind, and we can give you the proper paperwork for you to fill out.  Next Medicare Annual Wellness Visit scheduled for next year: Yes, 08/10/2024 at 1:00 p.m. via PHONE visit with Nurse Health Advisor.  Have you seen your provider in the last 6 months (3 months if uncontrolled diabetes)? Yes

## 2023-09-18 ENCOUNTER — Other Ambulatory Visit (INDEPENDENT_AMBULATORY_CARE_PROVIDER_SITE_OTHER): Payer: Self-pay

## 2023-09-18 ENCOUNTER — Encounter: Payer: Self-pay | Admitting: Orthopedic Surgery

## 2023-09-18 ENCOUNTER — Other Ambulatory Visit: Payer: Self-pay

## 2023-09-18 ENCOUNTER — Ambulatory Visit: Admitting: Orthopedic Surgery

## 2023-09-18 DIAGNOSIS — M25551 Pain in right hip: Secondary | ICD-10-CM

## 2023-09-18 DIAGNOSIS — M545 Low back pain, unspecified: Secondary | ICD-10-CM | POA: Diagnosis not present

## 2023-09-18 NOTE — Progress Notes (Unsigned)
 Office Visit Note   Patient: Laura Griffin           Date of Birth: Sep 23, 1945           MRN: [id number] Visit Date: 09/18/2023 Requested by: Candee Cha, MD [address] [place],  [place] [address] PCP: Candee Cha, MD  Subjective: Chief Complaint  Patient presents with   Other    Patient states she hurts all over from her low back down to her ankles. Complains of bilateral leg pain R>L with numbness, tingling and burning sensation. Having low back pain and states her legs feel unstable and from time to time has decreased balance. Also reports right groin pain    HPI: Laura Griffin is a 78 y.o. female who presents to the office reporting ***.                ROS: All systems reviewed are negative as they relate to the chief complaint within the history of present illness.  Patient denies fevers or chills.  Assessment & Plan: Visit Diagnoses:  1. Low back pain, unspecified back pain laterality, unspecified chronicity, unspecified whether sciatica present   2. Pain in right hip     Plan: ***  Follow-Up Instructions: No follow-ups on file.   Orders:  Orders Placed This Encounter  Procedures   XR Lumbar Spine 2-3 Views   XR HIP UNILAT W OR W/O PELVIS 2-3 VIEWS RIGHT   MR Lumbar Spine w/o contrast   No orders of the defined types were placed in this encounter.     Procedures: No procedures performed   Clinical Data: No additional findings.  Objective: Vital Signs: LMP 04/02/1996   Physical Exam:  Constitutional: Patient appears well-developed HEENT:  Head: Normocephalic Eyes:EOM are normal Neck: Normal range of motion Cardiovascular: Normal rate Pulmonary/chest: Effort normal Neurologic: Patient is alert Skin: Skin is warm Psychiatric: Patient has normal mood and affect  Ortho Exam: ***  Specialty Comments:  No specialty comments available.  Imaging: XR HIP UNILAT W OR W/O PELVIS 2-3 VIEWS RIGHT Result Date:  09/18/2023 AP pelvis lateral radiographs right hip reviewed.  No arthritis.  No fracture.  Joint space maintained.  Remainder of bony pelvis unremarkable    PMFS History: Patient Active Problem List   Diagnosis Date Noted   AMS (altered mental status) 09/23/2022   Stress incontinence 07/11/2021   Skin lesion 12/08/2020   Right-sided back pain 11/08/2020   Ganglion cyst of dorsum of right wrist 12/15/2018   Lichen sclerosus of female genitalia 09/25/2018    Class: Chronic   Hyperlipidemia 03/17/2018   Lichen planus 03/17/2018   Numbness and tingling of right leg 10/19/2016   Shoulder pain 03/01/2016   Headache 03/01/2016   Asymmetric SNHL (sensorineural hearing loss) 01/10/2016   Tinnitus of both ears 01/10/2016   Hearing loss 10/07/2015   Thyroid  nodule 05/24/2015   Globus sensation 05/05/2015   Neck pain 01/07/2015   Allergic rhinitis 07/19/2014   GERD (gastroesophageal reflux disease) 07/19/2014   Pitting edema 07/19/2014   Swallowing difficulty 07/06/2013   Orthostatic hypotension 06/12/2013   Osteoarthritis of right knee 12/26/2012   GLAUCOMA 11/04/2009   DDD (degenerative disc disease), lumbar 05/10/2009   URGE INCONTINENCE 06/01/2008   Hypercalcemia 08/12/2007   Overweight (BMI 25.0-29.9) 05/30/2006   HYPERTENSION, BENIGN SYSTEMIC 05/30/2006   Osteoarthritis 05/30/2006   Past Medical History:  Diagnosis Date   Allergic rhinitis 07/19/2014   Arthritis of knee, right 03/11/2006   Bursitis  of shoulder, right 09/19/1999   Cervical radiculopathy at C6 05/24/2011   Beginning Nov 2012. Multi-level foraminal narrowing in C-spine plain films    Diverticulosis    DIVERTICULOSIS OF COLON 05/30/2006   Colonoscopy 06/2014 - multiple diverticula, no other abnormalities, no further colonoscopies recommended given patient's age of 33     GERD (gastroesophageal reflux disease) 07/19/2014   Diagnosed clinically by [facility] in January of 2016.     Glaucoma    Hearing  decreased 08/07/2011   With whooshing tinnitus Mild bilateral loss on 08/2012 audiology who will recheck her hearing in one year     History of herniated intervertebral disc    Hypertension    HYPERTENSION, BENIGN SYSTEMIC 05/30/2006        Left sided sciatica 8/95,11/00   CT confirmed    LOW BACK PAIN SYNDROME 05/10/2009   Qualifier: Diagnosis of  By: Haig Levan CMA,, Thekla  Mild C 3-4, 4-5 lumbar spinal stenosis MRI 08/2011, Diffuse lower thoracic and lumbar DDD    Mitral valve prolapse    per patient heart murmur   Orthostatic hypotension 06/12/2013   Osteoarthritis    Osteoarthritis of right knee 12/26/2012   Confirmed on xray 07/2014    OSTEOARTHRITIS, MULTI SITES 05/30/2006   She is stoic about pain and wants to avoid surgeries Past xrays have documented DJD in right knee and lumbar spine     Overweight (BMI 25.0-29.9) 05/30/2006   Pitting edema 07/19/2014   Bilateral 1+ pitting edema of calves first noted on 07/19/2014.     Sciatica 05/30/2006   Qualifier: Diagnosis of  By: Tarry Farmer MD, Wayne  Involved left leg in 02/1999    Swallowing difficulty 07/06/2013   Urge incontinence 06/01/2008   Qualifier: Diagnosis of  By: Tarry Farmer MD, Forestine Igo      Family History  Problem Relation Age of Onset   Stroke Mother 57       cerebral hemorrhage   Hypertension Mother    Hypertension Father    Heart disease Father    Breast cancer Sister        1996   Breast cancer Sister 66   Breast cancer Maternal Aunt     Past Surgical History:  Procedure Laterality Date   CATARACT EXTRACTION  02/2011   left eye   TUBAL LIGATION  1973   VULVA /PERINEUM BIOPSY  2010   for hypopigmentation, non-specific result   Social History   Occupational History   Occupation: retired    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

## 2023-09-25 ENCOUNTER — Telehealth: Payer: Self-pay | Admitting: Orthopedic Surgery

## 2023-09-25 ENCOUNTER — Other Ambulatory Visit: Payer: Self-pay | Admitting: Surgical

## 2023-09-25 MED ORDER — DIAZEPAM 5 MG PO TABS: 5.0000 mg | ORAL_TABLET | Freq: Once | ORAL | 0 refills | Status: AC

## 2023-09-25 NOTE — Telephone Encounter (Signed)
IC advised submitted.  

## 2023-09-25 NOTE — Telephone Encounter (Signed)
 Sent in

## 2023-09-25 NOTE — Telephone Encounter (Signed)
 Pt asking for Valium  for her up coming MRI on 7/7 to keep her calm. Please send to [facility]. Please call pt when sent at  [phone].

## 2023-10-07 ENCOUNTER — Ambulatory Visit
Admission: RE | Admit: 2023-10-07 | Discharge: 2023-10-07 | Disposition: A | Discharge status: Home / Self Care | Source: Ambulatory Visit | Attending: Orthopedic Surgery | Admitting: Orthopedic Surgery

## 2023-10-07 DIAGNOSIS — M545 Low back pain, unspecified: Secondary | ICD-10-CM

## 2023-10-07 DIAGNOSIS — M48061 Spinal stenosis, lumbar region without neurogenic claudication: Secondary | ICD-10-CM | POA: Diagnosis not present

## 2023-10-07 DIAGNOSIS — M5126 Other intervertebral disc displacement, lumbar region: Secondary | ICD-10-CM | POA: Diagnosis not present

## 2023-10-07 DIAGNOSIS — M47816 Spondylosis without myelopathy or radiculopathy, lumbar region: Secondary | ICD-10-CM | POA: Diagnosis not present

## 2023-10-07 DIAGNOSIS — M419 Scoliosis, unspecified: Secondary | ICD-10-CM | POA: Diagnosis not present

## 2023-10-09 ENCOUNTER — Ambulatory Visit: Admitting: Orthopedic Surgery

## 2023-10-14 DIAGNOSIS — H401133 Primary open-angle glaucoma, bilateral, severe stage: Secondary | ICD-10-CM | POA: Diagnosis not present

## 2023-10-14 DIAGNOSIS — H5010 Unspecified exotropia: Secondary | ICD-10-CM | POA: Diagnosis not present

## 2023-10-14 DIAGNOSIS — H53021 Refractive amblyopia, right eye: Secondary | ICD-10-CM | POA: Diagnosis not present

## 2023-10-14 DIAGNOSIS — I10 Essential (primary) hypertension: Secondary | ICD-10-CM | POA: Diagnosis not present

## 2023-10-21 ENCOUNTER — Other Ambulatory Visit: Payer: Self-pay | Admitting: Family Medicine

## 2023-10-21 ENCOUNTER — Ambulatory Visit (INDEPENDENT_AMBULATORY_CARE_PROVIDER_SITE_OTHER): Admitting: Orthopedic Surgery

## 2023-10-21 ENCOUNTER — Encounter: Payer: Self-pay | Admitting: Orthopedic Surgery

## 2023-10-21 DIAGNOSIS — M545 Low back pain, unspecified: Secondary | ICD-10-CM

## 2023-10-21 DIAGNOSIS — M542 Cervicalgia: Secondary | ICD-10-CM

## 2023-10-21 NOTE — Progress Notes (Signed)
 Office Visit Note   Patient: Laura Griffin           Date of Birth: 1945-10-02           MRN: [id number] Visit Date: 10/21/2023 Requested by: Donah Laymon PARAS, MD [address] [place],  [place] [address] PCP: Donah Laymon PARAS, MD  Subjective: Chief Complaint  Patient presents with   Other    Follow up to review MRI scan    HPI: Laura Griffin is a 78 y.o. female who presents to the office reporting multiple orthopedic complaints.  Since she was last seen she has had an MRI scan of the lumbar spine.  The scan does show progressive multilevel thoracic and lumbar spondylosis compared with MRI from 2013.  New moderate size disc protrusion at L1-2 on the right-hand side could be accounting for her right leg pain.  She also has varying degrees of spinal stenosis in the lumbar spine.  She sees Dr. Georgina for cervical spine stenosis..                ROS: All systems reviewed are negative as they relate to the chief complaint within the history of present illness.  Patient denies fevers or chills.  Assessment & Plan: Visit Diagnoses:  1. Low back pain, unspecified back pain laterality, unspecified chronicity, unspecified whether sciatica present     Plan: Impression is right leg pain and low back pain with definite upper lumbar spondylosis and right-sided radicular nerve compression.  I think her bigger problem now is in her neck.  She is seeing Dr. Georgina in the near future for final decision making about surgical or nonsurgical intervention in the neck.  She does not want any surgery but she does not want to hurt.  She is in a difficult position.  She has been taking gabapentin  without relief.  She does not really want to go the narcotic route.  She has tried physical therapy.  Essentially she has to decide if she wants to live with this pain try some injections or get surgery.  I do not think injections would be necessarily helpful for the neck based on the amount of  stenosis.  In the lumbar spine they may or may not be helpful temporarily.  The knee has end-stage arthritis and that decision making is clearly either live with her symptoms or have a knee replacement.  However her bigger problem is her neck for which she is seeing Dr. Georgina next week.  Regarding the back I think it would be potentially helpful at least temporarily to get an injection with Dr. Eldonna and then follow-up with Dr. Georgina for her lumbar spine in about 6 weeks.  Follow-Up Instructions: No follow-ups on file.   Orders:  Orders Placed This Encounter  Procedures   Ambulatory referral to Physical Medicine Rehab   No orders of the defined types were placed in this encounter.     Procedures: No procedures performed   Clinical Data: No additional findings.  Objective: Vital Signs: LMP 04/02/1996   Physical Exam:  Constitutional: Patient appears well-developed HEENT:  Head: Normocephalic Eyes:EOM are normal Neck: Normal range of motion Cardiovascular: Normal rate Pulmonary/chest: Effort normal Neurologic: Patient is alert Skin: Skin is warm Psychiatric: Patient has normal mood and affect  Ortho Exam: Ortho exam demonstrates no effusion in either knee.  Range of motion is the same in both knees compared to prior visit.  Extensor mechanism intact.  No nerve root tension signs  today.  Hip flexor strength is 5 out of 5 on the right 5+ out of 5 on the left.  Otherwise the patient has excellent and symmetric strength with ankle dorsiflexion plantarflexion quad and hamstring strength testing.  No definite paresthesias L1-S1 bilaterally.  No other masses lymphadenopathy or skin changes noted in that lower extremity region  Specialty Comments:  MRI LUMBAR SPINE WITHOUT CONTRAST   TECHNIQUE: Multiplanar, multisequence MR imaging of the lumbar spine was performed. No intravenous contrast was administered.   COMPARISON:  Hip and lumbar spine radiographs 09/18/2023. MRI of  the lumbar spine 08/17/2011.   FINDINGS: Segmentation: Conventional anatomy assumed, with the last open disc space designated L5-S1.Concordant with prior imaging.   Alignment: Straightening of the usual lumbar lordosis with a minimal convex left scoliosis. No significant listhesis.   Vertebrae: No worrisome osseous lesion, acute fracture or pars defect. Multilevel endplate degenerative changes throughout the lumbar and lower thoracic spine have progressed from the previous MRI. There are type 1 components, especially at T11-12 and L2-3.   Conus medullaris: Extends to the L1-2 level. The conus and cauda equina appear normal.   Paraspinal and other soft tissues: No significant paraspinal findings.   Disc levels:   Sagittal images demonstrate loss of disc height with annular disc bulging and endplate osteophytes at T8-9, T9-10 and T10-11. No associated cord deformity or high-grade foraminal narrowing demonstrated.   T11-12: Progressive loss of disc height with annular disc bulging, endplate osteophytes and degenerative endplate edema. Moderate bilateral facet hypertrophy has progressed. There is mild spinal stenosis which is similar to the previous study. Mild foraminal narrowing bilaterally has progressed.   T12-L1: Progressive loss of disc height with annular disc bulging and endplate osteophytes. Progressive mild bilateral facet hypertrophy. No significant spinal stenosis or foraminal narrowing.   L1-2: Progressive loss of disc height with annular disc bulging. New moderate-sized disc protrusion in the right subarticular zone with mass effect on the thecal sac and probable right L2 nerve root impingement in the canal. In addition, there is inferior extension of an extruded disc fragment centrally without definite nerve root impingement. No significant foraminal narrowing.   L2-3: Progressive loss of disc height with annular disc bulging eccentric to the left. Moderate  facet and ligamentous hypertrophy. Mild spinal stenosis with mild narrowing of the left lateral recess and both foramina. No definite nerve root impingement.   L3-4: Progressive loss of disc height with annular disc bulging and endplate osteophytes asymmetric to the right. Progressive moderate facet and ligamentous hypertrophy. Mild spinal stenosis with mild narrowing of the lateral recesses. There is mild to moderate right and mild left foraminal narrowing.   L4-5: Progressive loss of disc height with annular disc bulging and endplate osteophytes asymmetric to the right. Moderate facet hypertrophy, greater on the right. Unchanged mild spinal stenosis and mild lateral recess narrowing bilaterally. Moderate right foraminal narrowing has progressed and may contribute to right L4 nerve root impingement.   L5-S1: Chronic loss of disc height with annular disc bulging and endplate osteophytes. There is a chronic central disc protrusion with mildly progressive facet hypertrophy, greater on the left. No spinal stenosis. Unchanged mild narrowing of the left lateral recess and both foramina.   IMPRESSION: 1. Progressive multilevel thoracolumbar spondylosis compared with remote MRI from 2013. 2. New moderate-sized disc protrusion in the right subarticular zone at L1-2 with probable right L2 nerve root impingement in the canal. In addition, there is inferior extension of an extruded disc fragment centrally without definite nerve  root impingement. 3. Additional progressive multilevel spondylosis with varying degrees of mild spinal stenosis and foraminal narrowing as described. Right foraminal narrowing appears greatest at L4-5 and has mildly progressed. 4. No acute osseous findings.     Electronically Signed   By: Elsie Perone M.D.   On: 10/08/2023 11:30  Imaging: No results found.   PMFS History: Patient Active Problem List   Diagnosis Date Noted   AMS (altered mental status)  09/23/2022   Stress incontinence 07/11/2021   Skin lesion 12/08/2020   Right-sided back pain 11/08/2020   Ganglion cyst of dorsum of right wrist 12/15/2018   Lichen sclerosus of female genitalia 09/25/2018    Class: Chronic   Hyperlipidemia 03/17/2018   Lichen planus 03/17/2018   Numbness and tingling of right leg 10/19/2016   Shoulder pain 03/01/2016   Headache 03/01/2016   Asymmetric SNHL (sensorineural hearing loss) 01/10/2016   Tinnitus of both ears 01/10/2016   Hearing loss 10/07/2015   Thyroid  nodule 05/24/2015   Globus sensation 05/05/2015   Neck pain 01/07/2015   Allergic rhinitis 07/19/2014   GERD (gastroesophageal reflux disease) 07/19/2014   Pitting edema 07/19/2014   Swallowing difficulty 07/06/2013   Orthostatic hypotension 06/12/2013   Osteoarthritis of right knee 12/26/2012   GLAUCOMA 11/04/2009   DDD (degenerative disc disease), lumbar 05/10/2009   URGE INCONTINENCE 06/01/2008   Hypercalcemia 08/12/2007   Overweight (BMI 25.0-29.9) 05/30/2006   HYPERTENSION, BENIGN SYSTEMIC 05/30/2006   Osteoarthritis 05/30/2006   Past Medical History:  Diagnosis Date   Allergic rhinitis 07/19/2014   Arthritis of knee, right 03/11/2006   Bursitis of shoulder, right 09/19/1999   Cervical radiculopathy at C6 05/24/2011   Beginning Nov 2012. Multi-level foraminal narrowing in C-spine plain films    Diverticulosis    DIVERTICULOSIS OF COLON 05/30/2006   Colonoscopy 06/2014 - multiple diverticula, no other abnormalities, no further colonoscopies recommended given patient's age of 4     GERD (gastroesophageal reflux disease) 07/19/2014   Diagnosed clinically by [facility] in January of 2016.     Glaucoma    Hearing decreased 08/07/2011   With whooshing tinnitus Mild bilateral loss on 08/2012 audiology who will recheck her hearing in one year     History of herniated intervertebral disc    Hypertension    HYPERTENSION, BENIGN SYSTEMIC 05/30/2006        Left sided  sciatica 8/95,11/00   CT confirmed    LOW BACK PAIN SYNDROME 05/10/2009   Qualifier: Diagnosis of  By: Lavelle CMA,, Thekla  Mild C 3-4, 4-5 lumbar spinal stenosis MRI 08/2011, Diffuse lower thoracic and lumbar DDD    Mitral valve prolapse    per patient heart murmur   Orthostatic hypotension 06/12/2013   Osteoarthritis    Osteoarthritis of right knee 12/26/2012   Confirmed on xray 07/2014    OSTEOARTHRITIS, MULTI SITES 05/30/2006   She is stoic about pain and wants to avoid surgeries Past xrays have documented DJD in right knee and lumbar spine     Overweight (BMI 25.0-29.9) 05/30/2006   Pitting edema 07/19/2014   Bilateral 1+ pitting edema of calves first noted on 07/19/2014.     Sciatica 05/30/2006   Qualifier: Diagnosis of  By: Levonne MD, Wayne  Involved left leg in 02/1999    Swallowing difficulty 07/06/2013   Urge incontinence 06/01/2008   Qualifier: Diagnosis of  By: Levonne MD, Lemond      Family History  Problem Relation Age of Onset   Stroke Mother 34  cerebral hemorrhage   Hypertension Mother    Hypertension Father    Heart disease Father    Breast cancer Sister        24   Breast cancer Sister 69   Breast cancer Maternal Aunt     Past Surgical History:  Procedure Laterality Date   CATARACT EXTRACTION  02/2011   left eye   TUBAL LIGATION  1973   VULVA /PERINEUM BIOPSY  2010   for hypopigmentation, non-specific result   Social History   Occupational History   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

## 2023-10-22 DIAGNOSIS — Z1231 Encounter for screening mammogram for malignant neoplasm of breast: Secondary | ICD-10-CM | POA: Diagnosis not present

## 2023-10-22 LAB — HM MAMMOGRAPHY

## 2023-10-24 ENCOUNTER — Encounter: Payer: Self-pay | Admitting: Family Medicine

## 2023-10-24 ENCOUNTER — Ambulatory Visit (INDEPENDENT_AMBULATORY_CARE_PROVIDER_SITE_OTHER): Admitting: Orthopedic Surgery

## 2023-10-24 ENCOUNTER — Ambulatory Visit: Admitting: Orthopedic Surgery

## 2023-10-24 DIAGNOSIS — M25561 Pain in right knee: Secondary | ICD-10-CM | POA: Diagnosis not present

## 2023-10-24 DIAGNOSIS — G8929 Other chronic pain: Secondary | ICD-10-CM

## 2023-10-24 NOTE — Progress Notes (Signed)
 Orthopedic Spine Surgery Office Note   Assessment: Patient is a 78 y.o. female with 2 issues:  Lumbar radiculopathy that I suspect is coming from her central stenosis at L3/4 Right knee pain due to osteoarthritis     Plan: -Her cervical myelopathy has been stable for a while now, so I told her to come back in if she notices progressive worsening with her balance or hand function.  Her options would be surgical decompression which she is really not interested in so not sure of that management would change but I could go over the recommendation for decompression again -For her lumbar radiculopathy, she has been getting injection with Dr. Eldonna.  I think that this is a reasonable treatment to try and could provide her with benefit -In regards to her knee, she has tried the standard nonoperative treatments and they have become less effective and then no longer working, so since she would be a candidate for knee replacement.  She is not interested in any kind of surgical management.  She inquired about pain management so I put in a referral for management for her - Patient should come back to the office on an as-needed basis   Patient expressed understanding of the plan and all questions were answered to the patient's satisfaction.    ___________________________________________________________________________     History:   Patient is a 78 y.o. female who comes in for 2 issues today.  She wanted talk about her right knee and her lower back.  She said she has had constant pain in her right knee.  She feels it on a daily basis.  She said there is nothing that she can really do to relieve it.  She has tried steroid injections in the past.  They were initially effective but are no longer helping her significant with her knee pain.  In regards to her back, she has had low back pain that radiates into the bilateral lower extremities.  She feels the going along the lateral aspect of the thighs.   Sometimes radiates into the lateral aspect of the legs.  She notices that the pain can occur with activity or with rest.  It is not as severe as her knee pain.  She typically feels this on a daily basis as well.  She has an upcoming injection scheduled with Dr. Eldonna for this issue.   Treatments tried: cane use, Tylenol , salonpas, heating pads, gabapentin      Physical Exam:   General: no acute distress, appears stated age Neurologic: alert, answering questions appropriately, following commands Respiratory: unlabored breathing on room air, symmetric chest rise Psychiatric: appropriate affect, normal cadence to speech     MSK (spine):   -Strength exam                                                   Left                  Right Grip strength                5/5                  5/5 Interosseus                  5/5  5/5 Wrist extension            5/5                  5/5 Wrist flexion                 5/5                  5/5 Elbow flexion                5/5                  5/5 Deltoid                          5/5                  5/5   EHL                              4/5                  4/5 TA                                 5/5                  5/5 GSC                             5/5                  5/5 Knee extension            5/5                  5/5 Hip flexion                    4/5                  4/5   -Sensory exam                           Sensation intact to light touch in L3-S1 nerve distributions of bilateral lower extremities             Sensation intact to light touch in C5-T1 nerve distributions of bilateral upper extremities   -Spurling: Negative bilaterally -Hoffman sign: Negative bilaterally -Clonus: No beats bilaterally -Interosseous wasting: None seen -Grip and release test: Positive -Romberg: Positive -Gait: Wide-based and uses cane -Imbalance with tandem gait: Yes  Right knee exam: No tenderness to palpation over the joint line or  the remainder of the knee, no effusion, palpable crepitus through range of motion, knee range of motion from 0-100   Imaging: XRs of the cervical spine from 04/09/2022 and 05/30/2022 were previously independently reviewed and interpreted, showing multilevel spondylosis with disc height loss and anterior osteophyte formation.  Spondylolisthesis at C3/4 that shifts about 1 mm between flexion/extension views.  No fracture or dislocation seen.   MRI of the cervical spine from 05/02/2022 was previously independently reviewed and interpreted, showing central stenosis at C3 3/4 and C4/5.  Foraminal stenosis on the right at C2/3, bilateral foraminal stenosis at C3/4, bilateral foraminal stenosis at C4/5, bilateral foraminal stenosis  at C5/6, and bilateral foraminal stenosis at C6/7.  No T2 cord signal change seen.  MRI of the lumbar spine from 10/07/2023 was independent reviewed and interpreted, showing right-sided paracentral disc herniation at L1/2 causing lateral recess stenosis.  Left-sided foraminal stenosis at L2/3.  Central, lateral recess, and bilateral foraminal stenosis at L3/4.  Right-sided foraminal stenosis at L4/5.     Patient name: Laura Griffin Patient MRN: [id number] Date of visit: 10/24/23

## 2023-10-28 ENCOUNTER — Telehealth: Payer: Self-pay | Admitting: Family Medicine

## 2023-10-28 ENCOUNTER — Ambulatory Visit: Payer: Self-pay | Admitting: Family Medicine

## 2023-10-28 NOTE — Telephone Encounter (Signed)
 Called pt and left VM discussing normal mammogram results. Left clinic number to call back if questions.  Rollene Keeling MD

## 2023-10-28 NOTE — Telephone Encounter (Signed)
 Patient LVM on nurse line in regards to missed call.   She reports the message was muffled.  Attempted to call patient back, however no answer.

## 2023-11-14 ENCOUNTER — Other Ambulatory Visit: Payer: Self-pay | Admitting: Physical Medicine and Rehabilitation

## 2023-11-14 ENCOUNTER — Telehealth: Payer: Self-pay

## 2023-11-14 MED ORDER — DIAZEPAM 5 MG PO TABS: ORAL_TABLET | ORAL | 0 refills | Status: DC

## 2023-11-14 NOTE — Telephone Encounter (Signed)
 Patient asking for Pre medication for [facility] on Aug 29th

## 2023-11-29 ENCOUNTER — Other Ambulatory Visit: Payer: Self-pay

## 2023-11-29 ENCOUNTER — Ambulatory Visit (INDEPENDENT_AMBULATORY_CARE_PROVIDER_SITE_OTHER): Admitting: Physical Medicine and Rehabilitation

## 2023-11-29 VITALS — BP 167/94 | HR 81

## 2023-11-29 DIAGNOSIS — M5416 Radiculopathy, lumbar region: Secondary | ICD-10-CM

## 2023-11-29 MED ORDER — METHYLPREDNISOLONE ACETATE 40 MG/ML IJ SUSP
40.0000 mg | Freq: Once | INTRAMUSCULAR | Status: AC
  Administered 2023-11-29: 40 mg

## 2023-11-29 NOTE — Progress Notes (Signed)
 Pain Scale   Average Pain 10 Patient advising she has chronic lower back pain radiating to her right knee.4 Patient was advised to follow up with PCP due to blood pressure level high today.        +Driver, -BT, -Dye Allergies.

## 2023-11-29 NOTE — Progress Notes (Signed)
 Laura Griffin - 78 y.o. female MRN [id number]  Date of birth: 26-Sep-1945  Office Visit Note: Visit Date: 11/29/2023 PCP: Donah Laymon PARAS, MD Referred by: Donah Laymon PARAS, MD  Subjective: Chief Complaint  Patient presents with   Lower Back - Pain   HPI:  Laura Griffin is a 78 y.o. female who comes in today at the request of Dr. JUDITHANN Glendia Hutchinson and Dr. Ozell Ada for planned Right L3-4 Lumbar Transforaminal epidural steroid injection with fluoroscopic guidance.  The patient has failed conservative care including home exercise, medications, time and activity modification.  This injection will be diagnostic and hopefully therapeutic.  Please see requesting physician notes for further details and justification.   ROS Otherwise per HPI.  Assessment & Plan: Visit Diagnoses:    ICD-10-CM   1. Lumbar radiculopathy  M54.16 XR C-ARM NO REPORT    Epidural Steroid injection    methylPREDNISolone  acetate (DEPO-MEDROL ) injection 40 mg      Plan: No additional findings.   Meds & Orders:  Meds ordered this encounter  Medications   methylPREDNISolone  acetate (DEPO-MEDROL ) injection 40 mg    Orders Placed This Encounter  Procedures   XR C-ARM NO REPORT   Epidural Steroid injection    Follow-up: Return for visit to requesting provider as needed.   Procedures: No procedures performed  Lumbosacral Transforaminal Epidural Steroid Injection - Sub-Pedicular Approach with Fluoroscopic Guidance  Patient: AZRIEL Griffin      Date of Birth: 03/17/1946 MRN: [id number] PCP: Donah Laymon PARAS, MD      Visit Date: 11/29/2023   Universal Protocol:    Date/Time: 11/29/2023  Consent Given By: the patient  Position: PRONE  Additional Comments: Vital signs were monitored before and after the procedure. Patient was prepped and draped in the usual sterile fashion. The correct patient, procedure, and site was verified.   Injection Procedure Details:   Procedure  diagnoses: Lumbar radiculopathy [M54.16]    Meds Administered:  Meds ordered this encounter  Medications   methylPREDNISolone  acetate (DEPO-MEDROL ) injection 40 mg    Laterality: Right  Location/Site: L3  Needle:5.0 in., 22 ga.  Short bevel or Quincke spinal needle  Needle Placement: Transforaminal  Findings:    -Comments: Excellent flow of contrast along the nerve, nerve root and into the epidural space.  Procedure Details: After squaring off the end-plates to get a true AP view, the C-arm was positioned so that an oblique view of the foramen as noted above was visualized. The target area is just inferior to the nose of the scotty dog or sub pedicular. The soft tissues overlying this structure were infiltrated with 2-3 ml. of 1% Lidocaine  without Epinephrine.  The spinal needle was inserted toward the target using a trajectory view along the fluoroscope beam.  Under AP and lateral visualization, the needle was advanced so it did not puncture dura and was located close the 6 O'Clock position of the pedical in AP tracterory. Biplanar projections were used to confirm position. Aspiration was confirmed to be negative for CSF and/or blood. A 1-2 ml. volume of Isovue-250 was injected and flow of contrast was noted at each level. Radiographs were obtained for documentation purposes.   After attaining the desired flow of contrast documented above, a 0.5 to 1.0 ml test dose of 0.25% Marcaine  was injected into each respective transforaminal space.  The patient was observed for 90 seconds post injection.  After no sensory deficits were reported, and normal lower extremity motor function was noted,  the above injectate was administered so that equal amounts of the injectate were placed at each foramen (level) into the transforaminal epidural space.   Additional Comments:  The patient tolerated the procedure well Dressing: 2 x 2 sterile gauze and Band-Aid    Post-procedure details: Patient  was observed during the procedure. Post-procedure instructions were reviewed.  Patient left the clinic in stable condition.    Clinical History: MRI LUMBAR SPINE WITHOUT CONTRAST   TECHNIQUE: Multiplanar, multisequence MR imaging of the lumbar spine was performed. No intravenous contrast was administered.   COMPARISON:  Hip and lumbar spine radiographs 09/18/2023. MRI of the lumbar spine 08/17/2011.   FINDINGS: Segmentation: Conventional anatomy assumed, with the last open disc space designated L5-S1.Concordant with prior imaging.   Alignment: Straightening of the usual lumbar lordosis with a minimal convex left scoliosis. No significant listhesis.   Vertebrae: No worrisome osseous lesion, acute fracture or pars defect. Multilevel endplate degenerative changes throughout the lumbar and lower thoracic spine have progressed from the previous MRI. There are type 1 components, especially at T11-12 and L2-3.   Conus medullaris: Extends to the L1-2 level. The conus and cauda equina appear normal.   Paraspinal and other soft tissues: No significant paraspinal findings.   Disc levels:   Sagittal images demonstrate loss of disc height with annular disc bulging and endplate osteophytes at T8-9, T9-10 and T10-11. No associated cord deformity or high-grade foraminal narrowing demonstrated.   T11-12: Progressive loss of disc height with annular disc bulging, endplate osteophytes and degenerative endplate edema. Moderate bilateral facet hypertrophy has progressed. There is mild spinal stenosis which is similar to the previous study. Mild foraminal narrowing bilaterally has progressed.   T12-L1: Progressive loss of disc height with annular disc bulging and endplate osteophytes. Progressive mild bilateral facet hypertrophy. No significant spinal stenosis or foraminal narrowing.   L1-2: Progressive loss of disc height with annular disc bulging. New moderate-sized disc protrusion in  the right subarticular zone with mass effect on the thecal sac and probable right L2 nerve root impingement in the canal. In addition, there is inferior extension of an extruded disc fragment centrally without definite nerve root impingement. No significant foraminal narrowing.   L2-3: Progressive loss of disc height with annular disc bulging eccentric to the left. Moderate facet and ligamentous hypertrophy. Mild spinal stenosis with mild narrowing of the left lateral recess and both foramina. No definite nerve root impingement.   L3-4: Progressive loss of disc height with annular disc bulging and endplate osteophytes asymmetric to the right. Progressive moderate facet and ligamentous hypertrophy. Mild spinal stenosis with mild narrowing of the lateral recesses. There is mild to moderate right and mild left foraminal narrowing.   L4-5: Progressive loss of disc height with annular disc bulging and endplate osteophytes asymmetric to the right. Moderate facet hypertrophy, greater on the right. Unchanged mild spinal stenosis and mild lateral recess narrowing bilaterally. Moderate right foraminal narrowing has progressed and may contribute to right L4 nerve root impingement.   L5-S1: Chronic loss of disc height with annular disc bulging and endplate osteophytes. There is a chronic central disc protrusion with mildly progressive facet hypertrophy, greater on the left. No spinal stenosis. Unchanged mild narrowing of the left lateral recess and both foramina.   IMPRESSION: 1. Progressive multilevel thoracolumbar spondylosis compared with remote MRI from 2013. 2. New moderate-sized disc protrusion in the right subarticular zone at L1-2 with probable right L2 nerve root impingement in the canal. In addition, there is inferior extension of  an extruded disc fragment centrally without definite nerve root impingement. 3. Additional progressive multilevel spondylosis with varying degrees of  mild spinal stenosis and foraminal narrowing as described. Right foraminal narrowing appears greatest at L4-5 and has mildly progressed. 4. No acute osseous findings.     Electronically Signed   By: Elsie Perone M.D.   On: 10/08/2023 11:30     Objective:  VS:  HT:    WT:   BMI:     BP:(!) 167/94  HR:81bpm  TEMP: ( )  RESP:  Physical Exam Vitals and nursing note reviewed.  Constitutional:      General: She is not in acute distress.    Appearance: Normal appearance. She is not ill-appearing.  HENT:     Head: Normocephalic and atraumatic.     Right Ear: External ear normal.     Left Ear: External ear normal.  Eyes:     Extraocular Movements: Extraocular movements intact.  Cardiovascular:     Rate and Rhythm: Normal rate.     Pulses: Normal pulses.  Pulmonary:     Effort: Pulmonary effort is normal. No respiratory distress.  Abdominal:     General: There is no distension.     Palpations: Abdomen is soft.  Musculoskeletal:        General: Tenderness present.     Cervical back: Neck supple.     Right lower leg: No edema.     Left lower leg: No edema.     Comments: Patient has good distal strength with no pain over the greater trochanters.  No clonus or focal weakness.  Skin:    Findings: No erythema, lesion or rash.  Neurological:     General: No focal deficit present.     Mental Status: She is alert and oriented to person, place, and time.     Sensory: No sensory deficit.     Motor: No weakness or abnormal muscle tone.     Coordination: Coordination normal.  Psychiatric:        Mood and Affect: Mood normal.        Behavior: Behavior normal.      Imaging: No results found.

## 2023-11-29 NOTE — Procedures (Signed)
 Lumbosacral Transforaminal Epidural Steroid Injection - Sub-Pedicular Approach with Fluoroscopic Guidance  Patient: Laura Griffin      Date of Birth: Dec 22, 1945 MRN: [id number] PCP: Donah Laymon PARAS, MD      Visit Date: 11/29/2023   Universal Protocol:    Date/Time: 11/29/2023  Consent Given By: the patient  Position: PRONE  Additional Comments: Vital signs were monitored before and after the procedure. Patient was prepped and draped in the usual sterile fashion. The correct patient, procedure, and site was verified.   Injection Procedure Details:   Procedure diagnoses: Lumbar radiculopathy [M54.16]    Meds Administered:  Meds ordered this encounter  Medications   methylPREDNISolone  acetate (DEPO-MEDROL ) injection 40 mg    Laterality: Right  Location/Site: L3  Needle:5.0 in., 22 ga.  Short bevel or Quincke spinal needle  Needle Placement: Transforaminal  Findings:    -Comments: Excellent flow of contrast along the nerve, nerve root and into the epidural space.  Procedure Details: After squaring off the end-plates to get a true AP view, the C-arm was positioned so that an oblique view of the foramen as noted above was visualized. The target area is just inferior to the nose of the scotty dog or sub pedicular. The soft tissues overlying this structure were infiltrated with 2-3 ml. of 1% Lidocaine  without Epinephrine.  The spinal needle was inserted toward the target using a trajectory view along the fluoroscope beam.  Under AP and lateral visualization, the needle was advanced so it did not puncture dura and was located close the 6 O'Clock position of the pedical in AP tracterory. Biplanar projections were used to confirm position. Aspiration was confirmed to be negative for CSF and/or blood. A 1-2 ml. volume of Isovue-250 was injected and flow of contrast was noted at each level. Radiographs were obtained for documentation purposes.   After attaining the  desired flow of contrast documented above, a 0.5 to 1.0 ml test dose of 0.25% Marcaine  was injected into each respective transforaminal space.  The patient was observed for 90 seconds post injection.  After no sensory deficits were reported, and normal lower extremity motor function was noted,   the above injectate was administered so that equal amounts of the injectate were placed at each foramen (level) into the transforaminal epidural space.   Additional Comments:  The patient tolerated the procedure well Dressing: 2 x 2 sterile gauze and Band-Aid    Post-procedure details: Patient was observed during the procedure. Post-procedure instructions were reviewed.  Patient left the clinic in stable condition.

## 2023-12-06 DIAGNOSIS — I10 Essential (primary) hypertension: Secondary | ICD-10-CM | POA: Diagnosis not present

## 2023-12-06 DIAGNOSIS — H5010 Unspecified exotropia: Secondary | ICD-10-CM | POA: Diagnosis not present

## 2023-12-06 DIAGNOSIS — H401133 Primary open-angle glaucoma, bilateral, severe stage: Secondary | ICD-10-CM | POA: Diagnosis not present

## 2023-12-06 DIAGNOSIS — H53021 Refractive amblyopia, right eye: Secondary | ICD-10-CM | POA: Diagnosis not present

## 2023-12-10 ENCOUNTER — Telehealth: Payer: Self-pay | Admitting: Physical Medicine and Rehabilitation

## 2023-12-10 NOTE — Telephone Encounter (Signed)
 Pt called requesting an appt with [facility]. Last injection 11/29/23. Pt phone number is [phone]

## 2023-12-11 ENCOUNTER — Ambulatory Visit (INDEPENDENT_AMBULATORY_CARE_PROVIDER_SITE_OTHER): Admitting: Physical Medicine and Rehabilitation

## 2023-12-11 DIAGNOSIS — M5441 Lumbago with sciatica, right side: Secondary | ICD-10-CM | POA: Diagnosis not present

## 2023-12-11 DIAGNOSIS — M5416 Radiculopathy, lumbar region: Secondary | ICD-10-CM

## 2023-12-11 DIAGNOSIS — G8929 Other chronic pain: Secondary | ICD-10-CM | POA: Diagnosis not present

## 2023-12-11 DIAGNOSIS — M5442 Lumbago with sciatica, left side: Secondary | ICD-10-CM | POA: Diagnosis not present

## 2023-12-11 NOTE — Progress Notes (Signed)
 Pain Scale   Average Pain 10 Patient advising she did not get any relief from her injection        +Driver, -BT, -Dye Allergies.

## 2023-12-11 NOTE — Progress Notes (Signed)
 Laura Griffin - 78 y.o. female MRN [id number]  Date of birth: 01-11-1946  Office Visit Note: Visit Date: 12/11/2023 PCP: Donah Laymon PARAS, MD Referred by: Donah Laymon PARAS, MD  Subjective: Chief Complaint  Patient presents with   Lower Back - Pain   HPI: Laura Griffin is a 78 y.o. female who comes in today for evaluation of chronic, worsening and severe right sided lower back pain radiating to both legs, right greater than left. Most severe discomfort to right thigh region. She is patient of Dr. Ozell Ada. She is somewhat of a poor historian. She underwent right L3 transforaminal epidural steroid injection in our office on 11/29/2023, minimal relief of pain with this procedure. Her pain is most severe in the morning after waking up. Her pain seems to be constant, no specific aggravating factors. Recent lumbar MRI imaging shows multi level spondylosis, there is new moderate-sized disc protrusion in the right subarticular zone at L1-L2 with probable right L2 nerve root impingement in the canal. There is central canal narrowing at L3-L4. She is currently using cane to assist with ambulation. She is managed by Dr. Addie from orthopedic standpoint, history of chronic bilateral knee pain.       Review of Systems  Musculoskeletal:  Positive for back pain.  Neurological:  Negative for tingling, sensory change, focal weakness and weakness.  All other systems reviewed and are negative.  Otherwise per HPI.  Assessment & Plan: Visit Diagnoses:    ICD-10-CM   1. Chronic bilateral low back pain with bilateral sciatica  M54.42    M54.41    G89.29     2. Lumbar radiculopathy  M54.16        Plan: Findings:  Chronic, worsening and severe right sided lower back pain radiating to both legs, right greater than left. Patient continues to have severe pain despite good conservative therapies such as home exercise regimen, rest and use of medications.  [name] clinical presentation  and exam are complex.  It is difficult to elicit information from patient and also think it is harder for her to discern chronic knee pain vs pain radiating from her back.  At this point, would not recommend continuing with interventional spine procedures.  I would like her to follow-up with Dr. Ada for further evaluation.  I do think she would be a good candidate for chronic pain management for both knees and lower back issues.  I did see where Dr. Ada placed a referral in July for chronic pain management at [facility]. I do not see upcoming scheduled appointment. She has no questions at this time.    Meds & Orders: No orders of the defined types were placed in this encounter.  No orders of the defined types were placed in this encounter.   Follow-up: Return if symptoms worsen or fail to improve.   Procedures: No procedures performed      Clinical History: MRI LUMBAR SPINE WITHOUT CONTRAST   TECHNIQUE: Multiplanar, multisequence MR imaging of the lumbar spine was performed. No intravenous contrast was administered.   COMPARISON:  Hip and lumbar spine radiographs 09/18/2023. MRI of the lumbar spine 08/17/2011.   FINDINGS: Segmentation: Conventional anatomy assumed, with the last open disc space designated L5-S1.Concordant with prior imaging.   Alignment: Straightening of the usual lumbar lordosis with a minimal convex left scoliosis. No significant listhesis.   Vertebrae: No worrisome osseous lesion, acute fracture or pars defect. Multilevel endplate degenerative changes throughout the lumbar and  lower thoracic spine have progressed from the previous MRI. There are type 1 components, especially at T11-12 and L2-3.   Conus medullaris: Extends to the L1-2 level. The conus and cauda equina appear normal.   Paraspinal and other soft tissues: No significant paraspinal findings.   Disc levels:   Sagittal images demonstrate loss of disc height with annular  disc bulging and endplate osteophytes at T8-9, T9-10 and T10-11. No associated cord deformity or high-grade foraminal narrowing demonstrated.   T11-12: Progressive loss of disc height with annular disc bulging, endplate osteophytes and degenerative endplate edema. Moderate bilateral facet hypertrophy has progressed. There is mild spinal stenosis which is similar to the previous study. Mild foraminal narrowing bilaterally has progressed.   T12-L1: Progressive loss of disc height with annular disc bulging and endplate osteophytes. Progressive mild bilateral facet hypertrophy. No significant spinal stenosis or foraminal narrowing.   L1-2: Progressive loss of disc height with annular disc bulging. New moderate-sized disc protrusion in the right subarticular zone with mass effect on the thecal sac and probable right L2 nerve root impingement in the canal. In addition, there is inferior extension of an extruded disc fragment centrally without definite nerve root impingement. No significant foraminal narrowing.   L2-3: Progressive loss of disc height with annular disc bulging eccentric to the left. Moderate facet and ligamentous hypertrophy. Mild spinal stenosis with mild narrowing of the left lateral recess and both foramina. No definite nerve root impingement.   L3-4: Progressive loss of disc height with annular disc bulging and endplate osteophytes asymmetric to the right. Progressive moderate facet and ligamentous hypertrophy. Mild spinal stenosis with mild narrowing of the lateral recesses. There is mild to moderate right and mild left foraminal narrowing.   L4-5: Progressive loss of disc height with annular disc bulging and endplate osteophytes asymmetric to the right. Moderate facet hypertrophy, greater on the right. Unchanged mild spinal stenosis and mild lateral recess narrowing bilaterally. Moderate right foraminal narrowing has progressed and may contribute to right L4 nerve  root impingement.   L5-S1: Chronic loss of disc height with annular disc bulging and endplate osteophytes. There is a chronic central disc protrusion with mildly progressive facet hypertrophy, greater on the left. No spinal stenosis. Unchanged mild narrowing of the left lateral recess and both foramina.   IMPRESSION: 1. Progressive multilevel thoracolumbar spondylosis compared with remote MRI from 2013. 2. New moderate-sized disc protrusion in the right subarticular zone at L1-2 with probable right L2 nerve root impingement in the canal. In addition, there is inferior extension of an extruded disc fragment centrally without definite nerve root impingement. 3. Additional progressive multilevel spondylosis with varying degrees of mild spinal stenosis and foraminal narrowing as described. Right foraminal narrowing appears greatest at L4-5 and has mildly progressed. 4. No acute osseous findings.     Electronically Signed   By: Elsie Perone M.D.   On: 10/08/2023 11:30   She reports that she quit smoking about 29 years ago. Her smoking use included cigarettes. She started smoking about 44 years ago. She has a 7.5 pack-year smoking history. She has never used smokeless tobacco. No results for input(s): HGBA1C, LABURIC in the last 8760 hours.  Objective:  VS:  HT:    WT:   BMI:     BP:   HR: bpm  TEMP: ( )  RESP:  Physical Exam Vitals and nursing note reviewed.  HENT:     Head: Normocephalic and atraumatic.     Right Ear: External ear normal.  Left Ear: External ear normal.     Nose: Nose normal.     Mouth/Throat:     Mouth: Mucous membranes are moist.  Eyes:     Extraocular Movements: Extraocular movements intact.  Cardiovascular:     Rate and Rhythm: Normal rate.     Pulses: Normal pulses.  Pulmonary:     Effort: Pulmonary effort is normal.  Abdominal:     General: Abdomen is flat. There is no distension.  Musculoskeletal:        General: Tenderness present.      Cervical back: Normal range of motion.     Comments: Patient is slow to rise from seated position to standing. Good lumbar range of motion. No pain noted with facet loading. 4/5 strength noted with bilateral hip flexion, 5/5 strength with knee flexion/extension, ankle dorsiflexion/plantarflexion and EHL. No clonus noted bilaterally. No pain upon palpation of greater trochanters. No pain with internal/external rotation of bilateral hips. Sensation intact bilaterally. Negative slump test bilaterally. Ambulates with cane, gait slow and unsteady.   Skin:    General: Skin is warm and dry.     Capillary Refill: Capillary refill takes less than 2 seconds.  Neurological:     Mental Status: She is alert and oriented to person, place, and time.     Motor: Weakness present.     Gait: Gait abnormal.  Psychiatric:        Mood and Affect: Mood normal.        Behavior: Behavior normal.     Ortho Exam  Imaging: No results found.  Past Medical/Family/Surgical/Social History: Medications & Allergies reviewed per EMR, new medications updated. Patient Active Problem List   Diagnosis Date Noted   AMS (altered mental status) 09/23/2022   Stress incontinence 07/11/2021   Skin lesion 12/08/2020   Right-sided back pain 11/08/2020   Ganglion cyst of dorsum of right wrist 12/15/2018   Lichen sclerosus of female genitalia 09/25/2018    Class: Chronic   Hyperlipidemia 03/17/2018   Lichen planus 03/17/2018   Numbness and tingling of right leg 10/19/2016   Shoulder pain 03/01/2016   Headache 03/01/2016   Asymmetric SNHL (sensorineural hearing loss) 01/10/2016   Tinnitus of both ears 01/10/2016   Hearing loss 10/07/2015   Thyroid  nodule 05/24/2015   Globus sensation 05/05/2015   Neck pain 01/07/2015   Allergic rhinitis 07/19/2014   GERD (gastroesophageal reflux disease) 07/19/2014   Pitting edema 07/19/2014   Swallowing difficulty 07/06/2013   Orthostatic hypotension 06/12/2013   Osteoarthritis of  right knee 12/26/2012   GLAUCOMA 11/04/2009   DDD (degenerative disc disease), lumbar 05/10/2009   URGE INCONTINENCE 06/01/2008   Hypercalcemia 08/12/2007   Overweight (BMI 25.0-29.9) 05/30/2006   HYPERTENSION, BENIGN SYSTEMIC 05/30/2006   Osteoarthritis 05/30/2006   Past Medical History:  Diagnosis Date   Allergic rhinitis 07/19/2014   Arthritis of knee, right 03/11/2006   Bursitis of shoulder, right 09/19/1999   Cervical radiculopathy at C6 05/24/2011   Beginning Nov 2012. Multi-level foraminal narrowing in C-spine plain films    Diverticulosis    DIVERTICULOSIS OF COLON 05/30/2006   Colonoscopy 06/2014 - multiple diverticula, no other abnormalities, no further colonoscopies recommended given patient's age of 27     GERD (gastroesophageal reflux disease) 07/19/2014   Diagnosed clinically by [facility] in January of 2016.     Glaucoma    Hearing decreased 08/07/2011   With whooshing tinnitus Mild bilateral loss on 08/2012 audiology who will recheck her hearing in one year  History of herniated intervertebral disc    Hypertension    HYPERTENSION, BENIGN SYSTEMIC 05/30/2006        Left sided sciatica 8/95,11/00   CT confirmed    LOW BACK PAIN SYNDROME 05/10/2009   Qualifier: Diagnosis of  By: Lavelle CMA,, Thekla  Mild C 3-4, 4-5 lumbar spinal stenosis MRI 08/2011, Diffuse lower thoracic and lumbar DDD    Mitral valve prolapse    per patient heart murmur   Orthostatic hypotension 06/12/2013   Osteoarthritis    Osteoarthritis of right knee 12/26/2012   Confirmed on xray 07/2014    OSTEOARTHRITIS, MULTI SITES 05/30/2006   She is stoic about pain and wants to avoid surgeries Past xrays have documented DJD in right knee and lumbar spine     Overweight (BMI 25.0-29.9) 05/30/2006   Pitting edema 07/19/2014   Bilateral 1+ pitting edema of calves first noted on 07/19/2014.     Sciatica 05/30/2006   Qualifier: Diagnosis of  By: Levonne MD, Wayne  Involved left leg in 02/1999    Swallowing  difficulty 07/06/2013   Urge incontinence 06/01/2008   Qualifier: Diagnosis of  By: Levonne MD, Lemond     Family History  Problem Relation Age of Onset   Stroke Mother 46       cerebral hemorrhage   Hypertension Mother    Hypertension Father    Heart disease Father    Breast cancer Sister        1996   Breast cancer Sister 47   Breast cancer Maternal Aunt    Past Surgical History:  Procedure Laterality Date   CATARACT EXTRACTION  02/2011   left eye   TUBAL LIGATION  1973   VULVA /PERINEUM BIOPSY  2010   for hypopigmentation, non-specific result   Social History   Occupational History   Occupation: retired    Associate Professor: [employer]    Comment: career development  Tobacco Use   Smoking status: Former    Current packs/day: 0.00    Average packs/day: 0.5 packs/day for 15.0 years (7.5 ttl pk-yrs)    Types: Cigarettes    Start date: 04/03/1979    Quit date: 04/02/1994    Years since quitting: 29.7   Smokeless tobacco: Never  Vaping Use   Vaping status: Never Used  Substance and Sexual Activity   Alcohol use: No   Drug use: No   Sexual activity: Not Currently    Partners: Male    Birth control/protection: Surgical    Comment: BTSP, less than 5, IC after 14, no STD, no abnormal pap, no DES exposure

## 2023-12-12 ENCOUNTER — Encounter: Payer: Self-pay | Admitting: Physical Medicine and Rehabilitation

## 2023-12-20 ENCOUNTER — Telehealth: Payer: Self-pay | Admitting: *Deleted

## 2023-12-20 NOTE — Telephone Encounter (Signed)
 Patient was identified as falling into the [place] Measure - Diabetes.   Patient was: Attribution and/or data issue.  Validation/Investigation needed.  Explanation:  Patient does not have a diagnosis of diabetes.

## 2024-01-03 DIAGNOSIS — Z23 Encounter for immunization: Secondary | ICD-10-CM | POA: Diagnosis not present

## 2024-01-19 DIAGNOSIS — Z23 Encounter for immunization: Secondary | ICD-10-CM | POA: Diagnosis not present

## 2024-02-05 ENCOUNTER — Other Ambulatory Visit (INDEPENDENT_AMBULATORY_CARE_PROVIDER_SITE_OTHER): Payer: Self-pay

## 2024-02-05 ENCOUNTER — Ambulatory Visit: Admitting: Orthopedic Surgery

## 2024-02-05 DIAGNOSIS — M542 Cervicalgia: Secondary | ICD-10-CM | POA: Diagnosis not present

## 2024-02-05 NOTE — Progress Notes (Signed)
 Orthopedic Spine Surgery Office Note   Assessment: Patient is a 78 y.o. female with recent onset of radicular right arm pain.  Continues to have symptoms of myelopathy     Plan: - She mentioned her symptoms of myelopathy.  She specifically talked about difficulty with recognizing her handwriting and having trouble with imbalance.  I told her that there is not a conservative treatment that would help with her myelopathy symptoms.  The only treatment for this issue is surgical and it does not reverse any symptoms at prevents progression.  She was not interested in any kind of surgery for this problem.  I talked about her radicular arm pain as well.  I told her that for this there are several conservative treatments.  She has done most of them including Tylenol , NSAIDs, Lyrica/gabapentin , injections.  She was interested in nonoperative treatment for her radicular pain as well.  She wanted to let me know when the pain got worse and then she would try another injection -Patient should come back to the office on an as-needed basis   Patient expressed understanding of the plan and all questions were answered to the patient's satisfaction.    ___________________________________________________________________________     History:   Patient is a 79 y.o. female who comes in for recent onset of right arm pain.  Patient has noticed pain from the shoulder to the elbow for the last 2 months or so.  She said the pain is tolerable but she wanted it evaluated.  She has not tried any specific treatments for this arm pain.  She also wanted to mention that she continues to have issues with balance and trouble with fine motor skills in the hand.  She said her handwriting is no longer recognizable.  She continues to use a cane.  She has no pain rating to the left upper extremity.  There is no trauma or injury that preceded the onset of her right arm pain.   Treatments tried: cane use, Tylenol , salonpas, heating pads,  gabapentin      Physical Exam:   General: no acute distress, appears stated age Neurologic: alert, answering questions appropriately, following commands Respiratory: unlabored breathing on room air, symmetric chest rise Psychiatric: appropriate affect, normal cadence to speech     MSK (spine):   -Strength exam                                                   Left                  Right Grip strength                5/5                  5/5 Interosseus                  5/5                  5/5 Wrist extension            5/5                  5/5 Wrist flexion                 5/5  5/5 Elbow flexion                5/5                  5/5 Deltoid                          5/5                  5/5   EHL                              4/5                  4/5 TA                                 5/5                  5/5 GSC                             5/5                  5/5 Knee extension            5/5                  5/5 Hip flexion                    4/5                  4/5   -Sensory exam                           Sensation intact to light touch in L3-S1 nerve distributions of bilateral lower extremities             Sensation intact to light touch in C5-T1 nerve distributions of bilateral upper extremities    Imaging: XRs of the cervical spine from 02/05/2024 were independently reviewed and interpreted, showing disc height loss at C3/4, C4/5, C5/6.  Anterior osteophyte formation seen at those levels as well.  Significant disc height loss at C4/5 with possible autofusion.  Positive cervical sagittal balance.  No fracture or dislocation seen.  MRI of the cervical spine from 05/02/2022 was previously independently reviewed and interpreted, showing central stenosis at C3/4 and C4/5.  Foraminal stenosis on the right at C2/3, bilateral foraminal stenosis at C3/4, bilateral foraminal stenosis at C4/5, bilateral foraminal stenosis at C5/6, and bilateral foraminal stenosis at  C6/7.  No T2 cord signal change seen.   MRI of the lumbar spine from 10/07/2023 was previously independently reviewed and interpreted, showing right-sided paracentral disc herniation at L1/2 causing lateral recess stenosis.  Left-sided foraminal stenosis at L2/3.  Central, lateral recess, and bilateral foraminal stenosis at L3/4.  Right-sided foraminal stenosis at L4/5.     Patient name: Laura Griffin Patient MRN: [id number] Date of visit: 02/05/24  I spent 20 minutes in the room with the patient going over her x-rays.  I also went over her symptoms and performed her exam during this time.  I use a time to separate out her symptoms between radicular and myelopathic.  We also used this time to  discuss treatment options for her radicular pain.  I also used this time to emphasize the fact that symptoms of myelopathy do not improve with conservative treatments and the only treatment would be surgical.  I did explain that surgical treatment does not reverse any symptoms of myelopathy but prevents progression.

## 2024-02-12 ENCOUNTER — Ambulatory Visit: Admitting: Orthopedic Surgery

## 2024-02-12 DIAGNOSIS — M25561 Pain in right knee: Secondary | ICD-10-CM

## 2024-02-12 DIAGNOSIS — G8929 Other chronic pain: Secondary | ICD-10-CM | POA: Diagnosis not present

## 2024-02-12 DIAGNOSIS — M25562 Pain in left knee: Secondary | ICD-10-CM | POA: Diagnosis not present

## 2024-02-14 ENCOUNTER — Encounter: Payer: Self-pay | Admitting: Orthopedic Surgery

## 2024-02-14 NOTE — Progress Notes (Signed)
 Office Visit Note   Patient: Laura Griffin           Date of Birth: 1945-12-02           MRN: [id number] Visit Date: 02/12/2024 Requested by: Donah Laymon PARAS, MD [address] [place],  [place] [address] PCP: Donah Laymon PARAS, MD  Subjective: Chief Complaint  Patient presents with   Right Knee - Pain    HPI: Laura Griffin is a 78 y.o. female who presents to the office reporting right knee pain.  Radiographs do show end-stage arthritis in both knees right worse than left.  Patient has some low back pain also affecting the right leg but the left leg is okay.  She had Monovisc injection in April.  That really helped all of her symptoms on that right-hand side including the knee.  She does use a cane.  Also uses Tylenol  as needed..                ROS: All systems reviewed are negative as they relate to the chief complaint within the history of present illness.  Patient denies fevers or chills.  Assessment & Plan: Visit Diagnoses:  1. Chronic pain of both knees     Plan: Impression is right knee arthritis with good response to gel injection.  We will get the gel arthritis again.  She wants to hold off on knee replacement.  Could consider working her back up more if this gel shot does not help the entire leg as it did in April. This patient is diagnosed with osteoarthritis of the knee(s).    Radiographs show evidence of joint space narrowing, osteophytes, subchondral sclerosis and/or subchondral cysts.  This patient has knee pain which interferes with functional and activities of daily living.    This patient has experienced inadequate response, adverse effects and/or intolerance with conservative treatments such as acetaminophen , NSAIDS, topical creams, physical therapy or regular exercise, knee bracing and/or weight loss.   This patient has experienced inadequate response or has a contraindication to intra articular steroid injections for at least 3 months.    This patient is not scheduled to have a total knee replacement within 6 months of starting treatment with viscosupplementation.   Follow-Up Instructions: No follow-ups on file.   Orders:  Orders Placed This Encounter  Procedures   Ambulatory request for injection medication   No orders of the defined types were placed in this encounter.     Procedures: No procedures performed   Clinical Data: No additional findings.  Objective: Vital Signs: LMP 04/02/1996   Physical Exam:  Constitutional: Patient appears well-developed HEENT:  Head: Normocephalic Eyes:EOM are normal Neck: Normal range of motion Cardiovascular: Normal rate Pulmonary/chest: Effort normal Neurologic: Patient is alert Skin: Skin is warm Psychiatric: Patient has normal mood and affect  Ortho Exam: Ortho exam demonstrates mild valgus alignment right knee with trace effusion.  Extensor mechanism intact.  No nerve root tension signs and no paresthesias L1 S1 bilaterally.  No masses lymphadenopathy or skin changes noted in that right knee region.  Extensor mechanism intact.  Slight antalgic gait to the right.  Pedal pulses palpable.  Specialty Comments:  MRI LUMBAR SPINE WITHOUT CONTRAST   TECHNIQUE: Multiplanar, multisequence MR imaging of the lumbar spine was performed. No intravenous contrast was administered.   COMPARISON:  Hip and lumbar spine radiographs 09/18/2023. MRI of the lumbar spine 08/17/2011.   FINDINGS: Segmentation: Conventional anatomy assumed, with the last open disc space designated L5-S1.Concordant  with prior imaging.   Alignment: Straightening of the usual lumbar lordosis with a minimal convex left scoliosis. No significant listhesis.   Vertebrae: No worrisome osseous lesion, acute fracture or pars defect. Multilevel endplate degenerative changes throughout the lumbar and lower thoracic spine have progressed from the previous MRI. There are type 1 components, especially at  T11-12 and L2-3.   Conus medullaris: Extends to the L1-2 level. The conus and cauda equina appear normal.   Paraspinal and other soft tissues: No significant paraspinal findings.   Disc levels:   Sagittal images demonstrate loss of disc height with annular disc bulging and endplate osteophytes at T8-9, T9-10 and T10-11. No associated cord deformity or high-grade foraminal narrowing demonstrated.   T11-12: Progressive loss of disc height with annular disc bulging, endplate osteophytes and degenerative endplate edema. Moderate bilateral facet hypertrophy has progressed. There is mild spinal stenosis which is similar to the previous study. Mild foraminal narrowing bilaterally has progressed.   T12-L1: Progressive loss of disc height with annular disc bulging and endplate osteophytes. Progressive mild bilateral facet hypertrophy. No significant spinal stenosis or foraminal narrowing.   L1-2: Progressive loss of disc height with annular disc bulging. New moderate-sized disc protrusion in the right subarticular zone with mass effect on the thecal sac and probable right L2 nerve root impingement in the canal. In addition, there is inferior extension of an extruded disc fragment centrally without definite nerve root impingement. No significant foraminal narrowing.   L2-3: Progressive loss of disc height with annular disc bulging eccentric to the left. Moderate facet and ligamentous hypertrophy. Mild spinal stenosis with mild narrowing of the left lateral recess and both foramina. No definite nerve root impingement.   L3-4: Progressive loss of disc height with annular disc bulging and endplate osteophytes asymmetric to the right. Progressive moderate facet and ligamentous hypertrophy. Mild spinal stenosis with mild narrowing of the lateral recesses. There is mild to moderate right and mild left foraminal narrowing.   L4-5: Progressive loss of disc height with annular disc bulging  and endplate osteophytes asymmetric to the right. Moderate facet hypertrophy, greater on the right. Unchanged mild spinal stenosis and mild lateral recess narrowing bilaterally. Moderate right foraminal narrowing has progressed and may contribute to right L4 nerve root impingement.   L5-S1: Chronic loss of disc height with annular disc bulging and endplate osteophytes. There is a chronic central disc protrusion with mildly progressive facet hypertrophy, greater on the left. No spinal stenosis. Unchanged mild narrowing of the left lateral recess and both foramina.   IMPRESSION: 1. Progressive multilevel thoracolumbar spondylosis compared with remote MRI from 2013. 2. New moderate-sized disc protrusion in the right subarticular zone at L1-2 with probable right L2 nerve root impingement in the canal. In addition, there is inferior extension of an extruded disc fragment centrally without definite nerve root impingement. 3. Additional progressive multilevel spondylosis with varying degrees of mild spinal stenosis and foraminal narrowing as described. Right foraminal narrowing appears greatest at L4-5 and has mildly progressed. 4. No acute osseous findings.     Electronically Signed   By: Elsie Perone M.D.   On: 10/08/2023 11:30  Imaging: No results found.   PMFS History: Patient Active Problem List   Diagnosis Date Noted   AMS (altered mental status) 09/23/2022   Stress incontinence 07/11/2021   Skin lesion 12/08/2020   Right-sided back pain 11/08/2020   Ganglion cyst of dorsum of right wrist 12/15/2018   Lichen sclerosus of female genitalia 09/25/2018    Class:  Chronic   Hyperlipidemia 03/17/2018   Lichen planus 03/17/2018   Numbness and tingling of right leg 10/19/2016   Shoulder pain 03/01/2016   Headache 03/01/2016   Asymmetric SNHL (sensorineural hearing loss) 01/10/2016   Tinnitus of both ears 01/10/2016   Hearing loss 10/07/2015   Thyroid  nodule 05/24/2015    Globus sensation 05/05/2015   Neck pain 01/07/2015   Allergic rhinitis 07/19/2014   GERD (gastroesophageal reflux disease) 07/19/2014   Pitting edema 07/19/2014   Swallowing difficulty 07/06/2013   Orthostatic hypotension 06/12/2013   Osteoarthritis of right knee 12/26/2012   GLAUCOMA 11/04/2009   DDD (degenerative disc disease), lumbar 05/10/2009   URGE INCONTINENCE 06/01/2008   Hypercalcemia 08/12/2007   Overweight (BMI 25.0-29.9) 05/30/2006   HYPERTENSION, BENIGN SYSTEMIC 05/30/2006   Osteoarthritis 05/30/2006   Past Medical History:  Diagnosis Date   Allergic rhinitis 07/19/2014   Arthritis of knee, right 03/11/2006   Bursitis of shoulder, right 09/19/1999   Cervical radiculopathy at C6 05/24/2011   Beginning Nov 2012. Multi-level foraminal narrowing in C-spine plain films    Diverticulosis    DIVERTICULOSIS OF COLON 05/30/2006   Colonoscopy 06/2014 - multiple diverticula, no other abnormalities, no further colonoscopies recommended given patient's age of 18     GERD (gastroesophageal reflux disease) 07/19/2014   Diagnosed clinically by [facility] in January of 2016.     Glaucoma    Hearing decreased 08/07/2011   With whooshing tinnitus Mild bilateral loss on 08/2012 audiology who will recheck her hearing in one year     History of herniated intervertebral disc    Hypertension    HYPERTENSION, BENIGN SYSTEMIC 05/30/2006        Left sided sciatica 8/95,11/00   CT confirmed    LOW BACK PAIN SYNDROME 05/10/2009   Qualifier: Diagnosis of  By: Lavelle CMA,, Thekla  Mild C 3-4, 4-5 lumbar spinal stenosis MRI 08/2011, Diffuse lower thoracic and lumbar DDD    Mitral valve prolapse    per patient heart murmur   Orthostatic hypotension 06/12/2013   Osteoarthritis    Osteoarthritis of right knee 12/26/2012   Confirmed on xray 07/2014    OSTEOARTHRITIS, MULTI SITES 05/30/2006   She is stoic about pain and wants to avoid surgeries Past xrays have documented DJD in right knee and lumbar  spine     Overweight (BMI 25.0-29.9) 05/30/2006   Pitting edema 07/19/2014   Bilateral 1+ pitting edema of calves first noted on 07/19/2014.     Sciatica 05/30/2006   Qualifier: Diagnosis of  By: Levonne MD, Wayne  Involved left leg in 02/1999    Swallowing difficulty 07/06/2013   Urge incontinence 06/01/2008   Qualifier: Diagnosis of  By: Levonne MD, Lemond      Family History  Problem Relation Age of Onset   Stroke Mother 95       cerebral hemorrhage   Hypertension Mother    Hypertension Father    Heart disease Father    Breast cancer Sister        1996   Breast cancer Sister 29   Breast cancer Maternal Aunt     Past Surgical History:  Procedure Laterality Date   CATARACT EXTRACTION  02/2011   left eye   TUBAL LIGATION  1973   VULVA /PERINEUM BIOPSY  2010   for hypopigmentation, non-specific result   Social History   Occupational History   Occupation: retired    Associate Professor: [employer]    Comment: career development  Tobacco Use  Smoking status: Former    Current packs/day: 0.00    Average packs/day: 0.5 packs/day for 15.0 years (7.5 ttl pk-yrs)    Types: Cigarettes    Start date: 04/03/1979    Quit date: 04/02/1994    Years since quitting: 29.8   Smokeless tobacco: Never  Vaping Use   Vaping status: Never Used  Substance and Sexual Activity   Alcohol use: No   Drug use: No   Sexual activity: Not Currently    Partners: Male    Birth control/protection: Surgical    Comment: BTSP, less than 5, IC after 16, no STD, no abnormal pap, no DES exposure

## 2024-03-23 ENCOUNTER — Ambulatory Visit

## 2024-03-23 VITALS — BP 147/100 | HR 76 | Ht 64.5 in | Wt 173.6 lb

## 2024-03-23 DIAGNOSIS — I10 Essential (primary) hypertension: Secondary | ICD-10-CM

## 2024-03-23 DIAGNOSIS — R1032 Left lower quadrant pain: Secondary | ICD-10-CM

## 2024-03-23 LAB — POCT URINALYSIS DIP (MANUAL ENTRY)
Bilirubin, UA: NEGATIVE
Blood, UA: NEGATIVE
Glucose, UA: NEGATIVE mg/dL
Ketones, POC UA: NEGATIVE mg/dL
Leukocytes, UA: NEGATIVE
Nitrite, UA: NEGATIVE
Protein Ur, POC: NEGATIVE mg/dL
Spec Grav, UA: 1.01
Urobilinogen, UA: 0.2 U/dL
pH, UA: 6

## 2024-03-23 MED ORDER — SIMETHICONE 80 MG PO CHEW: 80.0000 mg | CHEWABLE_TABLET | Freq: Four times a day (QID) | ORAL | 0 refills | Status: AC | PRN

## 2024-03-23 NOTE — Patient Instructions (Addendum)
 It was good to see you today.   Please bring ALL of your medications with you to every visit.    Today we talked about: Abdominal pain- no signs of infection, urine was normal. Suspect this is gas pain or referred pain from arthritis. Can take simethicone  or pepto as needed, along with tylenol . If no improvement, call our office to schedule apportionment for further discussion.      Thank you for choosing [facility]. Please refer to your mychart for specifics regarding today's visit or future appointments.

## 2024-03-23 NOTE — Progress Notes (Signed)
" ° ° °  SUBJECTIVE:   CHIEF COMPLAINT / HPI:   Stomach Issues - One month, intermittent, states she could go 2 weeks and feel no pain and then 2 weeks and feel it each night - sharp pain, feels like something is pushing out in L lower quad. No radiation - Mostly noticed at night when laying flat. Can also feel it first thing in AM - No nausea, dysuria, painful BM, no burping or bloating - Having 1-2 BM per day. Described as normal, no black stools - Has taken pepto with improvement - does not feel related to eating  Wt Readings from Last 5 Encounters:  03/23/24 173 lb 9.6 oz (78.7 kg)  08/05/23 174 lb (78.9 kg)  06/19/23 176 lb 6.4 oz (80 kg)  05/13/23 176 lb (79.8 kg)  02/13/23 178 lb (80.7 kg)    PERTINENT  PMH / PSH: HTN, OA, GERD  OBJECTIVE:   BP (!) 147/100   Pulse 76   Ht 5' 4.5 (1.638 m)   Wt 173 lb 9.6 oz (78.7 kg)   LMP 04/02/1996   SpO2 96%   BMI 29.34 kg/m   Physical Exam General: Alert, conversant, cooperative. No acute distress.  HEENT: PERRL. EOMI. MMM.  Cardiovascular: RRR Respiratory: Lungs CTAB. Normal work of breathing. Abdomen: Non distended. Non tender throughout. No masses. +BS Extremities: No cyanosis. No edema Musculoskeletal: No gross deformities.  Skin: Warm. Dry. No rashes. No icterus.  Neurologic: No focal deficits. Moving all extremities. Ambulates with cane Psychiatric: Cooperative. Appropriate mood. Appropriate affect.   ASSESSMENT/PLAN:   Assessment & Plan Left lower quadrant abdominal pain No evidence of infection, vitals all stable, weight stable. Exam reassuring without focal tenderness. Urine normal. Pain is intermittent and worse at night. Possible gas pain vs flare of other chronic pains in back. Pepto has been helping. Will trial simethicone  and continue pepto as needed. Has FU in appx 1 month.  Primary hypertension Elevated x2 today. Did not take BP meds. Encourage med adherence and FU with PCP    Milda LITTIE Deed,  MD [facility] "

## 2024-03-30 ENCOUNTER — Ambulatory Visit: Admitting: Surgical

## 2024-03-30 ENCOUNTER — Encounter: Payer: Self-pay | Admitting: Surgical

## 2024-03-30 DIAGNOSIS — M170 Bilateral primary osteoarthritis of knee: Secondary | ICD-10-CM | POA: Diagnosis not present

## 2024-03-30 MED ORDER — LIDOCAINE HCL 1 % IJ SOLN
5.0000 mL | INTRAMUSCULAR | Status: AC | PRN
  Administered 2024-03-30: 5 mL

## 2024-03-30 MED ORDER — HYALURONAN 88 MG/4ML IX SOSY
88.0000 mg | PREFILLED_SYRINGE | INTRA_ARTICULAR | Status: AC | PRN
  Administered 2024-03-30: 88 mg via INTRA_ARTICULAR

## 2024-03-30 NOTE — Progress Notes (Signed)
" ° °  Procedure Note  Patient: Laura Griffin             Date of Birth: 1945-09-20           MRN: [id number]             Visit Date: 03/30/2024  Procedures: Visit Diagnoses:  1. Bilateral primary osteoarthritis of knee     Large Joint Inj: bilateral knee on 03/30/2024 4:59 PM Indications: diagnostic evaluation, joint swelling and pain Details: 18 G 1.5 in needle, superolateral approach  Arthrogram: No  Medications (Right): 5 mL lidocaine  1 %; 88 mg Hyaluronan 88 MG/4ML Medications (Left): 5 mL lidocaine  1 %; 88 mg Hyaluronan 88 MG/4ML Outcome: tolerated well, no immediate complications Procedure, treatment alternatives, risks and benefits explained, specific risks discussed. Consent was given by the patient. Immediately prior to procedure a time out was called to verify the correct patient, procedure, equipment, support staff and site/side marked as required. Patient was prepped and draped in the usual sterile fashion.        "

## 2024-04-23 ENCOUNTER — Other Ambulatory Visit: Payer: Self-pay | Admitting: Family Medicine

## 2024-04-23 DIAGNOSIS — M542 Cervicalgia: Secondary | ICD-10-CM

## 2024-04-24 ENCOUNTER — Other Ambulatory Visit: Payer: Self-pay

## 2024-04-24 MED ORDER — LOSARTAN POTASSIUM 100 MG PO TABS: 100.0000 mg | ORAL_TABLET | Freq: Every day | ORAL | 1 refills | Status: AC

## 2024-05-25 ENCOUNTER — Ambulatory Visit: Admitting: Family Medicine

## 2024-06-23 ENCOUNTER — Ambulatory Visit: Admitting: Internal Medicine

## 2024-08-10 ENCOUNTER — Encounter
